# Patient Record
Sex: Female | Born: 1990 | Race: Black or African American | Hispanic: No | Marital: Single | Smoking: Never smoker
Health system: Southern US, Community
[De-identification: ages and names within clinical notes are randomized; demographics above are authoritative.]

## PROBLEM LIST (undated history)

## (undated) ENCOUNTER — Inpatient Hospital Stay (HOSPITAL_COMMUNITY): Payer: Self-pay

## (undated) DIAGNOSIS — Z30017 Encounter for initial prescription of implantable subdermal contraceptive: Secondary | ICD-10-CM

## (undated) DIAGNOSIS — F32A Depression, unspecified: Secondary | ICD-10-CM

## (undated) DIAGNOSIS — F419 Anxiety disorder, unspecified: Secondary | ICD-10-CM

## (undated) DIAGNOSIS — R109 Unspecified abdominal pain: Secondary | ICD-10-CM

## (undated) DIAGNOSIS — Z3490 Encounter for supervision of normal pregnancy, unspecified, unspecified trimester: Secondary | ICD-10-CM

## (undated) DIAGNOSIS — N760 Acute vaginitis: Secondary | ICD-10-CM

## (undated) DIAGNOSIS — B379 Candidiasis, unspecified: Secondary | ICD-10-CM

## (undated) DIAGNOSIS — A749 Chlamydial infection, unspecified: Secondary | ICD-10-CM

## (undated) DIAGNOSIS — O200 Threatened abortion: Secondary | ICD-10-CM

## (undated) DIAGNOSIS — J302 Other seasonal allergic rhinitis: Secondary | ICD-10-CM

## (undated) DIAGNOSIS — Z309 Encounter for contraceptive management, unspecified: Secondary | ICD-10-CM

## (undated) DIAGNOSIS — N898 Other specified noninflammatory disorders of vagina: Secondary | ICD-10-CM

## (undated) DIAGNOSIS — F329 Major depressive disorder, single episode, unspecified: Secondary | ICD-10-CM

## (undated) DIAGNOSIS — J329 Chronic sinusitis, unspecified: Secondary | ICD-10-CM

## (undated) DIAGNOSIS — R519 Headache, unspecified: Secondary | ICD-10-CM

## (undated) DIAGNOSIS — N926 Irregular menstruation, unspecified: Secondary | ICD-10-CM

## (undated) DIAGNOSIS — O039 Complete or unspecified spontaneous abortion without complication: Secondary | ICD-10-CM

## (undated) DIAGNOSIS — Z3046 Encounter for surveillance of implantable subdermal contraceptive: Secondary | ICD-10-CM

## (undated) DIAGNOSIS — R51 Headache: Secondary | ICD-10-CM

## (undated) DIAGNOSIS — M549 Dorsalgia, unspecified: Secondary | ICD-10-CM

## (undated) DIAGNOSIS — G8929 Other chronic pain: Secondary | ICD-10-CM

## (undated) DIAGNOSIS — M419 Scoliosis, unspecified: Secondary | ICD-10-CM

## (undated) DIAGNOSIS — B9689 Other specified bacterial agents as the cause of diseases classified elsewhere: Secondary | ICD-10-CM

## (undated) HISTORY — DX: Other seasonal allergic rhinitis: J30.2

## (undated) HISTORY — DX: Unspecified abdominal pain: R10.9

## (undated) HISTORY — DX: Encounter for surveillance of implantable subdermal contraceptive: Z30.46

## (undated) HISTORY — DX: Complete or unspecified spontaneous abortion without complication: O03.9

## (undated) HISTORY — DX: Other specified bacterial agents as the cause of diseases classified elsewhere: B96.89

## (undated) HISTORY — DX: Acute vaginitis: N76.0

## (undated) HISTORY — DX: Chlamydial infection, unspecified: A74.9

## (undated) HISTORY — DX: Encounter for initial prescription of implantable subdermal contraceptive: Z30.017

## (undated) HISTORY — DX: Irregular menstruation, unspecified: N92.6

## (undated) HISTORY — DX: Other specified noninflammatory disorders of vagina: N89.8

## (undated) HISTORY — DX: Encounter for supervision of normal pregnancy, unspecified, unspecified trimester: Z34.90

## (undated) HISTORY — PX: WISDOM TOOTH EXTRACTION: SHX21

## (undated) HISTORY — DX: Threatened abortion: O20.0

## (undated) HISTORY — DX: Encounter for contraceptive management, unspecified: Z30.9

## (undated) HISTORY — DX: Chronic sinusitis, unspecified: J32.9

## (undated) HISTORY — PX: HERNIA REPAIR: SHX51

## (undated) HISTORY — DX: Candidiasis, unspecified: B37.9

---
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
## 2012-01-03 ENCOUNTER — Emergency Department (HOSPITAL_COMMUNITY): Payer: Medicaid Other

## 2012-01-03 ENCOUNTER — Encounter (HOSPITAL_COMMUNITY): Payer: Self-pay

## 2012-01-03 ENCOUNTER — Emergency Department (HOSPITAL_COMMUNITY)
Admission: EM | Admit: 2012-01-03 | Discharge: 2012-01-03 | Disposition: A | Discharge status: Home / Self Care | Payer: Medicaid Other | Attending: Emergency Medicine | Admitting: Emergency Medicine

## 2012-01-03 DIAGNOSIS — S7000XA Contusion of unspecified hip, initial encounter: Secondary | ICD-10-CM | POA: Insufficient documentation

## 2012-01-03 DIAGNOSIS — S139XXA Sprain of joints and ligaments of unspecified parts of neck, initial encounter: Secondary | ICD-10-CM | POA: Insufficient documentation

## 2012-01-03 DIAGNOSIS — Z8739 Personal history of other diseases of the musculoskeletal system and connective tissue: Secondary | ICD-10-CM | POA: Insufficient documentation

## 2012-01-03 DIAGNOSIS — S161XXA Strain of muscle, fascia and tendon at neck level, initial encounter: Secondary | ICD-10-CM

## 2012-01-03 DIAGNOSIS — Z8619 Personal history of other infectious and parasitic diseases: Secondary | ICD-10-CM | POA: Insufficient documentation

## 2012-01-03 DIAGNOSIS — Z8659 Personal history of other mental and behavioral disorders: Secondary | ICD-10-CM | POA: Insufficient documentation

## 2012-01-03 DIAGNOSIS — S7002XA Contusion of left hip, initial encounter: Secondary | ICD-10-CM

## 2012-01-03 MED ORDER — IBUPROFEN 800 MG PO TABS
ORAL_TABLET | ORAL | Status: AC
  Filled 2012-01-03: qty 1

## 2012-01-03 MED ORDER — IBUPROFEN 600 MG PO TABS: 600.0000 mg | ORAL_TABLET | Freq: Four times a day (QID) | ORAL | Status: DC | PRN

## 2012-01-03 MED ORDER — CYCLOBENZAPRINE HCL 10 MG PO TABS
10.0000 mg | ORAL_TABLET | Freq: Once | ORAL | Status: AC
  Administered 2012-01-03: 10 mg via ORAL

## 2012-01-03 MED ORDER — CYCLOBENZAPRINE HCL 10 MG PO TABS: 10.0000 mg | ORAL_TABLET | Freq: Three times a day (TID) | ORAL | Status: DC | PRN

## 2012-01-03 MED ORDER — IBUPROFEN 800 MG PO TABS
800.0000 mg | ORAL_TABLET | Freq: Once | ORAL | Status: AC
  Administered 2012-01-03: 800 mg via ORAL

## 2012-01-03 MED ORDER — CYCLOBENZAPRINE HCL 10 MG PO TABS
ORAL_TABLET | ORAL | Status: AC
  Filled 2012-01-03: qty 1
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
## 2012-01-03 NOTE — ED Notes (Signed)
Patient with no complaints at this time. Respirations even and unlabored. Skin warm/dry. Discharge instructions reviewed with patient at this time. Patient given opportunity to voice concerns/ask questions. Patient discharged at this time and left Emergency Department with steady gait.   

## 2012-01-06 NOTE — ED Provider Notes (Signed)
Medical screening examination/treatment/procedure(s) were performed by non-physician practitioner and as supervising physician I was immediately available for consultation/collaboration.  Donnetta Hutching, MD 01/06/12 [phone]

## 2012-02-07 ENCOUNTER — Emergency Department (HOSPITAL_COMMUNITY)
Admission: EM | Admit: 2012-02-07 | Discharge: 2012-02-07 | Disposition: A | Discharge status: Home / Self Care | Payer: Medicaid Other | Attending: Emergency Medicine | Admitting: Emergency Medicine

## 2012-02-07 ENCOUNTER — Encounter (HOSPITAL_COMMUNITY): Payer: Self-pay | Admitting: *Deleted

## 2012-02-07 DIAGNOSIS — Y9389 Activity, other specified: Secondary | ICD-10-CM | POA: Insufficient documentation

## 2012-02-07 DIAGNOSIS — Z8659 Personal history of other mental and behavioral disorders: Secondary | ICD-10-CM | POA: Insufficient documentation

## 2012-02-07 DIAGNOSIS — S335XXA Sprain of ligaments of lumbar spine, initial encounter: Secondary | ICD-10-CM | POA: Insufficient documentation

## 2012-02-07 DIAGNOSIS — Y929 Unspecified place or not applicable: Secondary | ICD-10-CM | POA: Insufficient documentation

## 2012-02-07 DIAGNOSIS — S39012A Strain of muscle, fascia and tendon of lower back, initial encounter: Secondary | ICD-10-CM

## 2012-02-07 DIAGNOSIS — Y999 Unspecified external cause status: Secondary | ICD-10-CM | POA: Insufficient documentation

## 2012-02-07 DIAGNOSIS — Z79899 Other long term (current) drug therapy: Secondary | ICD-10-CM | POA: Insufficient documentation

## 2012-02-07 DIAGNOSIS — S46819A Strain of other muscles, fascia and tendons at shoulder and upper arm level, unspecified arm, initial encounter: Secondary | ICD-10-CM

## 2012-02-07 DIAGNOSIS — S43499A Other sprain of unspecified shoulder joint, initial encounter: Secondary | ICD-10-CM | POA: Insufficient documentation

## 2012-02-07 DIAGNOSIS — Z8619 Personal history of other infectious and parasitic diseases: Secondary | ICD-10-CM | POA: Insufficient documentation

## 2012-02-07 DIAGNOSIS — Z8719 Personal history of other diseases of the digestive system: Secondary | ICD-10-CM | POA: Insufficient documentation

## 2012-02-07 DIAGNOSIS — X500XXA Overexertion from strenuous movement or load, initial encounter: Secondary | ICD-10-CM | POA: Insufficient documentation

## 2012-02-07 DIAGNOSIS — Z8739 Personal history of other diseases of the musculoskeletal system and connective tissue: Secondary | ICD-10-CM | POA: Insufficient documentation

## 2012-02-07 DIAGNOSIS — R300 Dysuria: Secondary | ICD-10-CM | POA: Insufficient documentation

## 2012-02-07 DIAGNOSIS — R350 Frequency of micturition: Secondary | ICD-10-CM | POA: Insufficient documentation

## 2012-02-07 DIAGNOSIS — Z3202 Encounter for pregnancy test, result negative: Secondary | ICD-10-CM | POA: Insufficient documentation

## 2012-02-07 LAB — URINE MICROSCOPIC-ADD ON

## 2012-02-07 LAB — URINALYSIS, ROUTINE W REFLEX MICROSCOPIC
Ketones, ur: NEGATIVE mg/dL
Specific Gravity, Urine: >1.03 — ABNORMAL HIGH (ref 1.005–1.030)
pH: 6 (ref 5.0–8.0)

## 2012-02-07 LAB — PREGNANCY, URINE: Preg Test, Ur: NEGATIVE

## 2012-02-07 MED ORDER — HYDROCODONE-ACETAMINOPHEN 5-325 MG PO TABS: ORAL_TABLET | ORAL | Status: DC

## 2012-02-07 MED ORDER — CYCLOBENZAPRINE HCL 10 MG PO TABS: 10.0000 mg | ORAL_TABLET | Freq: Three times a day (TID) | ORAL | Status: DC | PRN

## 2012-02-07 NOTE — ED Notes (Signed)
Dysuria, frequency of urination , backpain. No fever.

## 2012-02-07 NOTE — ED Provider Notes (Signed)
History     CSN: [id number]  Arrival date & time 02/07/12  1222   First MD Initiated Contact with Patient 02/07/12 1450      Chief Complaint  Patient presents with  . Back Pain    (Consider location/radiation/quality/duration/timing/severity/associated sxs/prior treatment) HPI Comments: Patient c/o pain to her right lower back and right shoulder for several days.  Onset of pain after lifting.  She also reports having urinary frequency.  She states the pain is worse with palpation and movement.  She denies numbness or weakness or the extremities, abd pain, fever, neck pain, vaginal bleeding or discharge.  Patient is a 22 y.o. female presenting with back pain. The history is provided by the patient.  Back Pain  This is a new problem. The current episode started 2 days ago. The problem occurs constantly. The problem has not changed since onset.The pain is associated with lifting heavy objects. The pain is present in the lumbar spine (right trapiezus muscle). The quality of the pain is described as aching. The pain does not radiate. The pain is mild. The symptoms are aggravated by bending, twisting and certain positions. Associated symptoms include dysuria. Pertinent negatives include no chest pain, no fever, no numbness, no abdominal pain, no abdominal swelling, no bowel incontinence, no perianal numbness, no bladder incontinence, no pelvic pain, no paresthesias, no paresis, no tingling and no weakness. She has tried nothing for the symptoms. The treatment provided no relief.    Past Medical History  Diagnosis Date  . Right sided abdominal pain   . Scoliosis   . No pertinent past medical history   . Chlamydia     treated 6/6  . Depression     History reviewed. No pertinent past surgical history.  Family History  Problem Relation Age of Onset  . Asthma Mother   . Asthma Sister   . Asthma Brother   . Asthma Daughter   . Diabetes Maternal Grandmother   . Hypertension Maternal  Grandmother   . Asthma Maternal Grandmother   . Heart disease Maternal Grandmother   . Stroke Maternal Grandmother   . Hypertension Maternal Grandfather   . Asthma Maternal Grandfather   . Heart disease Maternal Grandfather     History  Substance Use Topics  . Smoking status: Never Smoker   . Smokeless tobacco: Never Used  . Alcohol Use: No    OB History    Grav Para Term Preterm Abortions TAB SAB Ect Mult Living   3 2 2  0 0 0 0 0 0 2      Review of Systems  Constitutional: Negative for fever, activity change and appetite change.  Respiratory: Negative for shortness of breath.   Cardiovascular: Negative for chest pain.  Gastrointestinal: Negative for vomiting, abdominal pain, constipation and bowel incontinence.  Genitourinary: Positive for dysuria and frequency. Negative for bladder incontinence, hematuria, flank pain, decreased urine volume, vaginal bleeding, vaginal discharge, difficulty urinating, menstrual problem and pelvic pain.       No perineal numbness or incontinence of urine or feces  Musculoskeletal: Positive for back pain. Negative for joint swelling and arthralgias.  Skin: Negative for rash.  Neurological: Negative for tingling, weakness, numbness and paresthesias.  All other systems reviewed and are negative.    Allergies  Review of patient's allergies indicates no known allergies.  Home Medications   Current Outpatient Rx  Name  Route  Sig  Dispense  Refill  . NORETHIN-ETH ESTRAD-FE BIPHAS 1 MG-10 MCG / 10 MCG PO TABS  Oral   Take 1 tablet by mouth daily.           BP 111/63  Pulse 87  Temp 97.8 F (36.6 C) (Oral)  Resp 18  Ht 5\' 2"  (1.575 m)  Wt 120 lb (54.432 kg)  BMI 21.95 kg/m2  SpO2 100%  LMP 02/07/2012  Breastfeeding? No  Physical Exam  Nursing note and vitals reviewed. Constitutional: She is oriented to person, place, and time. She appears well-developed and well-nourished. No distress.  HENT:  Head: Normocephalic and  atraumatic.  Neck: Normal range of motion. Neck supple.  Cardiovascular: Normal rate, regular rhythm and intact distal pulses.   No murmur heard. Pulmonary/Chest: Effort normal and breath sounds normal.  Musculoskeletal: She exhibits tenderness. She exhibits no edema.       Lumbar back: She exhibits tenderness and pain. She exhibits normal range of motion, no bony tenderness, no swelling, no deformity, no laceration and normal pulse.       Back:       ttp of right trapezius and right lumbar paraspinal muscles.  rom of the extremities intact, grip strength nml, dp pulses brisk, sensation intact.  No edema.  Neurological: She is alert and oriented to person, place, and time. No cranial nerve deficit or sensory deficit. She exhibits normal muscle tone. Coordination and gait normal.  Reflex Scores:      Tricep reflexes are 2+ on the right side and 2+ on the left side.      Bicep reflexes are 2+ on the right side and 2+ on the left side.      Patellar reflexes are 2+ on the right side and 2+ on the left side.      Achilles reflexes are 2+ on the right side and 2+ on the left side. Skin: Skin is warm and dry.    ED Course  Procedures (including critical care time)  Labs Reviewed  URINALYSIS, ROUTINE W REFLEX MICROSCOPIC - Abnormal; Notable for the following:    Specific Gravity, Urine >1.030 (*)     Glucose, UA 100 (*)     Hgb urine dipstick SMALL (*)     Bilirubin Urine SMALL (*)     Protein, ur TRACE (*)     All other components within normal limits  URINE MICROSCOPIC-ADD ON - Abnormal; Notable for the following:    Squamous Epithelial / LPF FEW (*)     All other components within normal limits  PREGNANCY, URINE  URINE CULTURE    Results for orders placed during the hospital encounter of 02/07/12  URINALYSIS, ROUTINE W REFLEX MICROSCOPIC      Component Value Range   Color, Urine YELLOW  YELLOW   APPearance CLEAR  CLEAR   Specific Gravity, Urine >1.030 (*) 1.005 - 1.030   pH 6.0   5.0 - 8.0   Glucose, UA 100 (*) NEGATIVE mg/dL   Hgb urine dipstick SMALL (*) NEGATIVE   Bilirubin Urine SMALL (*) NEGATIVE   Ketones, ur NEGATIVE  NEGATIVE mg/dL   Protein, ur TRACE (*) NEGATIVE mg/dL   Urobilinogen, UA 1.0  0.0 - 1.0 mg/dL   Nitrite NEGATIVE  NEGATIVE   Leukocytes, UA NEGATIVE  NEGATIVE  URINE MICROSCOPIC-ADD ON      Component Value Range   Squamous Epithelial / LPF FEW (*) RARE   WBC, UA 0-2  <3 WBC/hpf   RBC / HPF 0-2  <3 RBC/hpf   Urine-Other MUCOUS PRESENT    PREGNANCY, URINE  Component Value Range   Preg Test, Ur NEGATIVE  NEGATIVE  URINE CULTURE      Component Value Range   Specimen Description URINE, CLEAN CATCH     Special Requests NONE     Culture  Setup Time 02/08/2012 02:51     Colony Count NO GROWTH     Culture NO GROWTH     Report Status 02/08/2012 FINAL       Urine culture pending  MDM    ttp of the right trapezius and lumbar paraspinal muscles.  Patient has ttp of the lumbar paraspinal muscles.  No focal neuro deficits on exam.  Ambulates with a steady gait.   Doubt emergent neurological or infectious process  Pain likely related to musculoskeletal pain .  Pt agrees to f/u with pmd if needed.     Pegi Milazzo L. [place], [place] 02/09/12 2101

## 2012-02-08 LAB — URINE CULTURE

## 2012-02-10 NOTE — ED Provider Notes (Signed)
Medical screening examination/treatment/procedure(s) were performed by non-physician practitioner and as supervising physician I was immediately available for consultation/collaboration.   Glynn Octave, MD 02/10/12 [phone]

## 2012-02-20 ENCOUNTER — Emergency Department (HOSPITAL_COMMUNITY)
Admission: EM | Admit: 2012-02-20 | Discharge: 2012-02-20 | Disposition: A | Discharge status: Home / Self Care | Payer: Medicaid Other | Attending: Emergency Medicine | Admitting: Emergency Medicine

## 2012-02-20 ENCOUNTER — Encounter (HOSPITAL_COMMUNITY): Payer: Self-pay | Admitting: *Deleted

## 2012-02-20 DIAGNOSIS — Z8739 Personal history of other diseases of the musculoskeletal system and connective tissue: Secondary | ICD-10-CM | POA: Insufficient documentation

## 2012-02-20 DIAGNOSIS — R509 Fever, unspecified: Secondary | ICD-10-CM | POA: Insufficient documentation

## 2012-02-20 DIAGNOSIS — Z8619 Personal history of other infectious and parasitic diseases: Secondary | ICD-10-CM | POA: Insufficient documentation

## 2012-02-20 DIAGNOSIS — Z8659 Personal history of other mental and behavioral disorders: Secondary | ICD-10-CM | POA: Insufficient documentation

## 2012-02-20 DIAGNOSIS — IMO0001 Reserved for inherently not codable concepts without codable children: Secondary | ICD-10-CM | POA: Insufficient documentation

## 2012-02-20 DIAGNOSIS — J3489 Other specified disorders of nose and nasal sinuses: Secondary | ICD-10-CM | POA: Insufficient documentation

## 2012-02-20 DIAGNOSIS — R059 Cough, unspecified: Secondary | ICD-10-CM | POA: Insufficient documentation

## 2012-02-20 DIAGNOSIS — R6889 Other general symptoms and signs: Secondary | ICD-10-CM | POA: Insufficient documentation

## 2012-02-20 DIAGNOSIS — Z79899 Other long term (current) drug therapy: Secondary | ICD-10-CM | POA: Insufficient documentation

## 2012-02-20 DIAGNOSIS — R05 Cough: Secondary | ICD-10-CM | POA: Insufficient documentation

## 2012-02-20 DIAGNOSIS — R51 Headache: Secondary | ICD-10-CM | POA: Insufficient documentation

## 2012-02-20 MED ORDER — IBUPROFEN 400 MG PO TABS
600.0000 mg | ORAL_TABLET | Freq: Once | ORAL | Status: AC
  Administered 2012-02-20: 600 mg via ORAL
  Filled 2012-02-20: qty 2

## 2012-02-20 MED ORDER — OSELTAMIVIR PHOSPHATE 75 MG PO CAPS: 75.0000 mg | ORAL_CAPSULE | Freq: Two times a day (BID) | ORAL | Status: DC

## 2012-02-20 NOTE — ED Provider Notes (Signed)
Medical screening examination/treatment/procedure(s) were performed by non-physician practitioner and as supervising physician I was immediately available for consultation/collaboration. Kristoph Sattler, MD, FACEP   Reyne Falconi L Tanae Petrosky, MD 02/20/12 1948 

## 2012-02-20 NOTE — ED Provider Notes (Addendum)
History     CSN: [id number]  Arrival date & time 02/20/12  1512   First MD Initiated Contact with Patient 02/20/12 1612      Chief Complaint  Patient presents with  . Fever    (Consider location/radiation/quality/duration/timing/severity/associated sxs/prior treatment) HPI Comments: Deanna Hughes is a 22 y.o. Female who developed sudden onset of fever to 100.0,  Generalized headache,  Nonproductive cough with sneezing,  Nasal congestion with clear rhinorrhea.  She also described generalized myalgias.  She denies nausea,  Vomiting,  Abdominal pain, sob, chest pain and diarrhea.  She has not taken any medicines prior to arrival.  She has not had a flu shot this season.      The history is provided by the patient.    Past Medical History  Diagnosis Date  . Right sided abdominal pain   . Scoliosis   . No pertinent past medical history   . Chlamydia     treated 6/6  . Depression     History reviewed. No pertinent past surgical history.  Family History  Problem Relation Age of Onset  . Asthma Mother   . Asthma Sister   . Asthma Brother   . Asthma Daughter   . Diabetes Maternal Grandmother   . Hypertension Maternal Grandmother   . Asthma Maternal Grandmother   . Heart disease Maternal Grandmother   . Stroke Maternal Grandmother   . Hypertension Maternal Grandfather   . Asthma Maternal Grandfather   . Heart disease Maternal Grandfather     History  Substance Use Topics  . Smoking status: Never Smoker   . Smokeless tobacco: Never Used  . Alcohol Use: No    OB History    Grav Para Term Preterm Abortions TAB SAB Ect Mult Living   3 2 2  0 0 0 0 0 0 2      Review of Systems  Constitutional: Positive for fever and chills.  HENT: Positive for congestion and rhinorrhea. Negative for sore throat, neck pain and neck stiffness.   Eyes: Negative.   Respiratory: Positive for cough. Negative for chest tightness, shortness of breath and wheezing.   Cardiovascular:  Negative for chest pain.  Gastrointestinal: Negative for nausea and abdominal pain.  Genitourinary: Negative.   Musculoskeletal: Positive for myalgias. Negative for joint swelling and arthralgias.  Skin: Negative.  Negative for rash and wound.  Neurological: Positive for headaches. Negative for dizziness, weakness, light-headedness and numbness.  Hematological: Negative.   Psychiatric/Behavioral: Negative.     Allergies  Review of patient's allergies indicates no known allergies.  Home Medications   Current Outpatient Rx  Name  Route  Sig  Dispense  Refill  . CYCLOBENZAPRINE HCL 10 MG PO TABS   Oral   Take 1 tablet (10 mg total) by mouth 3 (three) times daily as needed for muscle spasms.   21 tablet   0   . HYDROCODONE-ACETAMINOPHEN 5-325 MG PO TABS      Take one-two tabs po q 4-6 hrs prn pain   15 tablet   0   . NORETHIN-ETH ESTRAD-FE BIPHAS 1 MG-10 MCG / 10 MCG PO TABS   Oral   Take 1 tablet by mouth daily.         . OSELTAMIVIR PHOSPHATE 75 MG PO CAPS   Oral   Take 1 capsule (75 mg total) by mouth 2 (two) times daily.   10 capsule   0     BP 108/60  Pulse 113  Temp 99.5 F (  37.5 C) (Oral)  Resp 20  Ht 5\' 2"  (1.575 m)  Wt 125 lb (56.7 kg)  BMI 22.86 kg/m2  SpO2 100%  LMP 02/07/2012  Breastfeeding? No  Physical Exam  Constitutional: She is oriented to person, place, and time. She appears well-developed and well-nourished.       Low grade fever at 99.5  HENT:  Head: Normocephalic and atraumatic.  Right Ear: Tympanic membrane and ear canal normal.  Left Ear: Tympanic membrane and ear canal normal.  Nose: Mucosal edema and rhinorrhea present.  Mouth/Throat: Uvula is midline, oropharynx is clear and moist and mucous membranes are normal. No oropharyngeal exudate, posterior oropharyngeal edema, posterior oropharyngeal erythema or tonsillar abscesses.  Eyes: Conjunctivae normal are normal.  Cardiovascular: Regular rhythm and normal heart sounds.   Tachycardia present.   Pulmonary/Chest: Effort normal. No respiratory distress. She has no wheezes. She has no rales.  Abdominal: Soft. There is no tenderness.  Musculoskeletal: Normal range of motion.  Lymphadenopathy:    She has no cervical adenopathy.  Neurological: She is alert and oriented to person, place, and time.  Skin: Skin is warm and dry. No rash noted.  Psychiatric: She has a normal mood and affect.    ED Course  Procedures (including critical care time)  Labs Reviewed - No data to display No results found.   1. Febrile illness       MDM  Febrile illness,  Sx suggestive of influenza.  Offered tamiflu which pt will take.  Encouraged rest,  Fluids,  Tylenol or motrin prn fever.          Burgess Amor, PA 02/20/12 1637  Burgess Amor, PA 02/20/12 [phone]

## 2012-02-20 NOTE — ED Provider Notes (Signed)
Medical screening examination/treatment/procedure(s) were performed by non-physician practitioner and as supervising physician I was immediately available for consultation/collaboration. Devoria Albe, MD, Armando Gang   Ward Givens, MD 02/20/12 [phone]

## 2012-02-20 NOTE — ED Notes (Signed)
Fever, body aches, cough, no sore throat, no vomiting,

## 2012-06-11 ENCOUNTER — Emergency Department (HOSPITAL_COMMUNITY)
Admission: EM | Admit: 2012-06-11 | Discharge: 2012-06-11 | Disposition: A | Discharge status: Home / Self Care | Payer: Medicaid Other | Attending: Emergency Medicine | Admitting: Emergency Medicine

## 2012-06-11 ENCOUNTER — Encounter (HOSPITAL_COMMUNITY): Payer: Self-pay | Admitting: *Deleted

## 2012-06-11 DIAGNOSIS — Z79899 Other long term (current) drug therapy: Secondary | ICD-10-CM | POA: Insufficient documentation

## 2012-06-11 DIAGNOSIS — F329 Major depressive disorder, single episode, unspecified: Secondary | ICD-10-CM | POA: Insufficient documentation

## 2012-06-11 DIAGNOSIS — K4090 Unilateral inguinal hernia, without obstruction or gangrene, not specified as recurrent: Secondary | ICD-10-CM | POA: Insufficient documentation

## 2012-06-11 DIAGNOSIS — F3289 Other specified depressive episodes: Secondary | ICD-10-CM | POA: Insufficient documentation

## 2012-06-11 DIAGNOSIS — Z8619 Personal history of other infectious and parasitic diseases: Secondary | ICD-10-CM | POA: Insufficient documentation

## 2012-06-11 DIAGNOSIS — Z8739 Personal history of other diseases of the musculoskeletal system and connective tissue: Secondary | ICD-10-CM | POA: Insufficient documentation

## 2012-06-11 MED ORDER — IBUPROFEN 600 MG PO TABS: 600.0000 mg | ORAL_TABLET | Freq: Four times a day (QID) | ORAL | Status: DC | PRN

## 2012-06-11 MED ORDER — IBUPROFEN 400 MG PO TABS
400.0000 mg | ORAL_TABLET | Freq: Once | ORAL | Status: AC
  Administered 2012-06-11: 400 mg via ORAL
  Filled 2012-06-11: qty 1

## 2012-06-11 MED ORDER — DOCUSATE SODIUM 100 MG PO CAPS: 100.0000 mg | ORAL_CAPSULE | Freq: Two times a day (BID) | ORAL | Status: DC

## 2012-06-11 MED ORDER — TRAMADOL HCL 50 MG PO TABS: 50.0000 mg | ORAL_TABLET | Freq: Four times a day (QID) | ORAL | Status: DC | PRN

## 2012-06-11 NOTE — ED Notes (Signed)
Patient w/R lower quadrant hernia x 2 years w/pain beginning last night.

## 2012-06-11 NOTE — ED Provider Notes (Signed)
History  This chart was scribed for [name], MD by Bennett Scrape, ED Scribe. This patient was seen in room [facility] and the patient's care was started at 3:10 PM.  CSN: [id number]  Arrival date & time 06/11/12  1229   First MD Initiated Contact with Patient 06/11/12 1510      Chief Complaint  Patient presents with  . Groin Pain    Patient is a 22 y.o. female presenting with groin pain. The history is provided by the patient. No language interpreter was used.  Groin Pain This is a new problem. The current episode started yesterday. The problem occurs constantly. The problem has been gradually worsening. The symptoms are aggravated by walking, coughing and exertion. Nothing relieves the symptoms. She has tried nothing for the symptoms.    HPI Comments: Deanna Hughes is a 22 y.o. female who presents to the Emergency Department complaining of a right inguinal hernia that has been present for the past year with the development of constant pain last night. The pain is worse with exertion, heavy lifting, ambulation and coughing. She also states that it has been non-reducible since last night when at baseline it is. She denies excessive straining and reports that she has been able to have normal BMs since the onset of the pain. She reports that she has been putting off following up with a surgeon due to fear of sugery.  She denies nausea, emesis and fever as associated symptoms. She has a h/o scoliosis and depression. Pt denies smoking and alcohol use.  Past Medical History  Diagnosis Date  . Right sided abdominal pain   . Scoliosis   . No pertinent past medical history   . Chlamydia     treated 6/6  . Depression     History reviewed. No pertinent past surgical history.  Family History  Problem Relation Age of Onset  . Asthma Mother   . Asthma Sister   . Asthma Brother   . Asthma Daughter   . Diabetes Maternal Grandmother   . Hypertension Maternal Grandmother   .  Asthma Maternal Grandmother   . Heart disease Maternal Grandmother   . Stroke Maternal Grandmother   . Hypertension Maternal Grandfather   . Asthma Maternal Grandfather   . Heart disease Maternal Grandfather     History  Substance Use Topics  . Smoking status: Never Smoker   . Smokeless tobacco: Never Used  . Alcohol Use: No    OB History   Grav Para Term Preterm Abortions TAB SAB Ect Mult Living   3 2 2  0 0 0 0 0 0 2      Review of Systems  Constitutional: Negative for fever.  Gastrointestinal: Negative for nausea and vomiting.  Genitourinary:       Positive for right groin pain  All other systems reviewed and are negative.    Allergies  Review of patient's allergies indicates no known allergies.  Home Medications   Current Outpatient Rx  Name  Route  Sig  Dispense  Refill  . loratadine (CLARITIN) 10 MG tablet   Oral   Take 10 mg by mouth daily.         . Norethindrone-Ethinyl Estradiol-Fe Biphas (LO LOESTRIN FE) 1 MG-10 MCG / 10 MCG tablet   Oral   Take 1 tablet by mouth daily.         Marland Kitchen docusate sodium (COLACE) 100 MG capsule   Oral   Take 1 capsule (100 mg total) by mouth  every 12 (twelve) hours.   60 capsule   0   . ibuprofen (ADVIL,MOTRIN) 600 MG tablet   Oral   Take 1 tablet (600 mg total) by mouth every 6 (six) hours as needed for pain.   30 tablet   0   . traMADol (ULTRAM) 50 MG tablet   Oral   Take 1 tablet (50 mg total) by mouth every 6 (six) hours as needed for pain.   15 tablet   0     Triage Vitals: BP 107/66  Pulse 84  Temp(Src) 98.2 F (36.8 C) (Oral)  Resp 18  Ht 5\' 2"  (1.575 m)  Wt 129 lb (58.514 kg)  BMI 23.59 kg/m2  SpO2 100%  LMP 05/22/2012  Physical Exam  Nursing note and vitals reviewed. Constitutional: She is oriented to person, place, and time. She appears well-developed and well-nourished. No distress.  HENT:  Head: Normocephalic and atraumatic.  Mouth/Throat: Oropharynx is clear and moist.  Eyes:  Conjunctivae and EOM are normal. Pupils are equal, round, and reactive to light.  Neck: Neck supple. No tracheal deviation present.  Cardiovascular: Normal rate and regular rhythm.   Pulmonary/Chest: Effort normal and breath sounds normal. No respiratory distress.  Abdominal: Soft. There is no tenderness.  Genitourinary:  Mild easily reduced right inguinal hernia with mild tenderness to palpation at the area, chaperone present  Musculoskeletal: Normal range of motion. She exhibits no edema.  Neurological: She is alert and oriented to person, place, and time.  Skin: Skin is warm and dry.  Psychiatric: She has a normal mood and affect. Her behavior is normal.    ED Course  Procedures (including critical care time)  Medications  ibuprofen (ADVIL,MOTRIN) tablet 400 mg (400 mg Oral Given 06/11/12 1555)    DIAGNOSTIC STUDIES: Oxygen Saturation is 100% on room air, normal by my interpretation.    COORDINATION OF CARE: 3:31 PM-Discussed treatment plan which includes ibuprofen with pt at bedside and pt agreed to plan. Advised pt to avoid exertion, to wear a supportive garment and to take stool softeners. Addressed symptoms to return for and strongly advised that she needs to f/u with a surgeon.  Labs Reviewed - No data to display No results found.   1. Right inguinal hernia       MDM  I personally performed the services described in this documentation, which was scribed in my presence. The recorded information has been reviewed and is accurate.      [name], MD 06/11/12 [phone]

## 2012-06-11 NOTE — ED Notes (Signed)
Pt alert & oriented x4, stable gait. Patient given discharge instructions, paperwork & prescription(s). Patient  instructed to stop at the registration desk to finish any additional paperwork. Patient verbalized understanding. Pt left department w/ no further questions. 

## 2012-06-11 NOTE — ED Notes (Signed)
Pt states she has a hernia to the upper right of her vagina, first started when she was pregnant with last child prior to childbirth. Hernia varies in degree of pain for pt but this time has been worse than others. When pt stands up hernia is pronounced.

## 2012-06-19 ENCOUNTER — Encounter: Payer: Self-pay | Admitting: *Deleted

## 2012-06-20 ENCOUNTER — Ambulatory Visit: Payer: Self-pay | Admitting: Adult Health

## 2012-07-03 ENCOUNTER — Encounter: Payer: Self-pay | Admitting: Adult Health

## 2012-07-03 ENCOUNTER — Ambulatory Visit (INDEPENDENT_AMBULATORY_CARE_PROVIDER_SITE_OTHER): Payer: Medicaid Other | Admitting: Adult Health

## 2012-07-03 VITALS — BP 98/70 | Ht 62.0 in | Wt 123.8 lb

## 2012-07-03 DIAGNOSIS — Z309 Encounter for contraceptive management, unspecified: Secondary | ICD-10-CM

## 2012-07-03 DIAGNOSIS — Z3202 Encounter for pregnancy test, result negative: Secondary | ICD-10-CM

## 2012-07-03 DIAGNOSIS — J309 Allergic rhinitis, unspecified: Secondary | ICD-10-CM

## 2012-07-03 DIAGNOSIS — B379 Candidiasis, unspecified: Secondary | ICD-10-CM

## 2012-07-03 DIAGNOSIS — Z3200 Encounter for pregnancy test, result unknown: Secondary | ICD-10-CM

## 2012-07-03 DIAGNOSIS — J302 Other seasonal allergic rhinitis: Secondary | ICD-10-CM

## 2012-07-03 DIAGNOSIS — N926 Irregular menstruation, unspecified: Secondary | ICD-10-CM

## 2012-07-03 HISTORY — DX: Other seasonal allergic rhinitis: J30.2

## 2012-07-03 HISTORY — DX: Candidiasis, unspecified: B37.9

## 2012-07-03 LAB — POCT WET PREP (WET MOUNT)

## 2012-07-03 MED ORDER — CETIRIZINE HCL 10 MG PO TABS: 10.0000 mg | ORAL_TABLET | Freq: Every day | ORAL | Status: DC

## 2012-07-03 MED ORDER — FLUCONAZOLE 150 MG PO TABS: 150.0000 mg | ORAL_TABLET | Freq: Once | ORAL | Status: DC

## 2012-07-03 MED ORDER — NORETHIN ACE-ETH ESTRAD-FE 1-20 MG-MCG(24) PO CHEW: 1.0000 | CHEWABLE_TABLET | Freq: Every day | ORAL | Status: DC

## 2012-07-03 NOTE — Progress Notes (Signed)
Subjective:     Patient ID: Deanna Hughes, female   DOB: 06/13/1990, 22 y.o.   MRN: [id number]  HPI Deanna Hughes is a 22 year old black female in for vaginal discharge and itching x 4 days, and eyes itching. She is having breakthrough bleeding with the pill on the second row. Review of Systems Patient denies any headaches, blurred vision, shortness of breath, chest pain, abdominal pain, problems with bowel movements, urination, or intercourse. No joint pain or mood swings. Positives as in HPI Reviewed past medical,surgical, social and family history. Reviewed medications and allergies.     Objective:   Physical Exam BP 98/70  Ht 5\' 2"  (1.575 m)  Wt 123 lb 12 oz (56.133 kg)  BMI 22.63 kg/m2  LMP 06/24/2012  Breastfeeding? NoUrine pregnancy test negative. Skin warm and dry.Pelvic: external genitalia is normal in appearance, vagina: white clumpy discharge without odor,I cleared the discharge out with a big Q-tip, cervix:smooth and bulbous, uterus: normal size, shape and contour, non tender, no masses felt, adnexa: no masses or tenderness noted. Wet prep: + for yeast GC/CHL obtained.      Assessment:      Yeast infection BTB Contraceptive management   Allergies Plan:      Rx Diflucan 150 mg 1 now and 1 in 3 days if needed with 1 refill Rx zyrtec 10 mg 1 daily prn Rx minastrin, finish current pack of pills then start Minastrin 1 daily, use condoms Return in 3 months in follow up

## 2012-07-03 NOTE — Patient Instructions (Addendum)
Follow up in 3 months Finish current pills the start minastrin and use condomsCandida Infection, Adult A candida infection (also called yeast, fungus and Monilia infection) is an overgrowth of yeast that can occur anywhere on the body. A yeast infection commonly occurs in warm, moist body areas. Usually, the infection remains localized but can spread to become a systemic infection. A yeast infection may be a sign of a more severe disease such as diabetes, leukemia, or AIDS. A yeast infection can occur in both men and women. In women, Candida vaginitis is a vaginal infection. It is one of the most common causes of vaginitis. Men usually do not have symptoms or know they have an infection until other problems develop. Men may find out they have a yeast infection because their sex partner has a yeast infection. Uncircumcised men are more likely to get a yeast infection than circumcised men. This is because the uncircumcised glans is not exposed to air and does not remain as dry as that of a circumcised glans. Older adults may develop yeast infections around dentures. CAUSES  Women  Antibiotics.  Steroid medication taken for a long time.  Being overweight (obese).  Diabetes.  Poor immune condition.  Certain serious medical conditions.  Immune suppressive medications for organ transplant patients.  Chemotherapy.  Pregnancy.  Menstration.  Stress and fatigue.  Intravenous drug use.  Oral contraceptives.  Wearing tight-fitting clothes in the crotch area.  Catching it from a sex partner who has a yeast infection.  Spermicide.  Intravenous, urinary, or other catheters. Men  Catching it from a sex partner who has a yeast infection.  Having oral or anal sex with a person who has the infection.  Spermicide.  Diabetes.  Antibiotics.  Poor immune system.  Medications that suppress the immune system.  Intravenous drug use.  Intravenous, urinary, or other  catheters. SYMPTOMS  Women  Thick, white vaginal discharge.  Vaginal itching.  Redness and swelling in and around the vagina.  Irritation of the lips of the vagina and perineum.  Blisters on the vaginal lips and perineum.  Painful sexual intercourse.  Low blood sugar (hypoglycemia).  Painful urination.  Bladder infections.  Intestinal problems such as constipation, indigestion, bad breath, bloating, increase in gas, diarrhea, or loose stools. Men  Men may develop intestinal problems such as constipation, indigestion, bad breath, bloating, increase in gas, diarrhea, or loose stools.  Dry, cracked skin on the penis with itching or discomfort.  Jock itch.  Dry, flaky skin.  Athlete's foot.  Hypoglycemia. DIAGNOSIS  Women  A history and an exam are performed.  The discharge may be examined under a microscope.  A culture may be taken of the discharge. Men  A history and an exam are performed.  Any discharge from the penis or areas of cracked skin will be looked at under the microscope and cultured.  Stool samples may be cultured. TREATMENT  Women  Vaginal antifungal suppositories and creams.  Medicated creams to decrease irritation and itching on the outside of the vagina.  Warm compresses to the perineal area to decrease swelling and discomfort.  Oral antifungal medications.  Medicated vaginal suppositories or cream for repeated or recurrent infections.  Wash and dry the irritation areas before applying the cream.  Eating yogurt with lactobacillus may help with prevention and treatment.  Sometimes painting the vagina with gentian violet solution may help if creams and suppositories do not work. Men  Antifungal creams and oral antifungal medications.  Sometimes treatment must continue  for 30 days after the symptoms go away to prevent recurrence. HOME CARE INSTRUCTIONS  Women  Use cotton underwear and avoid tight-fitting clothing.  Avoid  colored, scented toilet paper and deodorant tampons or pads.  Do not douche.  Keep your diabetes under control.  Finish all the prescribed medications.  Keep your skin clean and dry.  Consume milk or yogurt with lactobacillus active culture regularly. If you get frequent yeast infections and think that is what the infection is, there are over-the-counter medications that you can get. If the infection does not show healing in 3 days, talk to your caregiver.  Tell your sex partner you have a yeast infection. Your partner may need treatment also, especially if your infection does not clear up or recurs. Men  Keep your skin clean and dry.  Keep your diabetes under control.  Finish all prescribed medications.  Tell your sex partner that you have a yeast infection so they can be treated if necessary. SEEK MEDICAL CARE IF:   Your symptoms do not clear up or worsen in one week after treatment.  You have an oral temperature above 102 F (38.9 C).  You have trouble swallowing or eating for a prolonged time.  You develop blisters on and around your vagina.  You develop vaginal bleeding and it is not your menstrual period.  You develop abdominal pain.  You develop intestinal problems as mentioned above.  You get weak or lightheaded.  You have painful or increased urination.  You have pain during sexual intercourse. MAKE SURE YOU:   Understand these instructions.  Will watch your condition.  Will get help right away if you are not doing well or get worse. Document Released: 02/11/2004 Document Revised: 03/28/2011 Document Reviewed: 05/25/2009 [facility] Patient Information 2014 [place], [place].

## 2012-07-09 ENCOUNTER — Telehealth: Payer: Self-pay | Admitting: Adult Health

## 2012-07-09 ENCOUNTER — Emergency Department (HOSPITAL_COMMUNITY)
Admission: EM | Admit: 2012-07-09 | Discharge: 2012-07-09 | Disposition: A | Discharge status: Home / Self Care | Payer: Medicaid Other | Attending: Emergency Medicine | Admitting: Emergency Medicine

## 2012-07-09 ENCOUNTER — Encounter (HOSPITAL_COMMUNITY): Payer: Self-pay | Admitting: *Deleted

## 2012-07-09 DIAGNOSIS — K047 Periapical abscess without sinus: Secondary | ICD-10-CM | POA: Insufficient documentation

## 2012-07-09 DIAGNOSIS — K089 Disorder of teeth and supporting structures, unspecified: Secondary | ICD-10-CM | POA: Insufficient documentation

## 2012-07-09 DIAGNOSIS — Z8659 Personal history of other mental and behavioral disorders: Secondary | ICD-10-CM | POA: Insufficient documentation

## 2012-07-09 DIAGNOSIS — R000 Tachycardia, unspecified: Secondary | ICD-10-CM | POA: Insufficient documentation

## 2012-07-09 DIAGNOSIS — Z8619 Personal history of other infectious and parasitic diseases: Secondary | ICD-10-CM | POA: Insufficient documentation

## 2012-07-09 DIAGNOSIS — Z8739 Personal history of other diseases of the musculoskeletal system and connective tissue: Secondary | ICD-10-CM | POA: Insufficient documentation

## 2012-07-09 DIAGNOSIS — K0889 Other specified disorders of teeth and supporting structures: Secondary | ICD-10-CM

## 2012-07-09 MED ORDER — NAPROXEN 375 MG PO TABS: 375.0000 mg | ORAL_TABLET | Freq: Two times a day (BID) | ORAL | Status: DC

## 2012-07-09 MED ORDER — HYDROCODONE-ACETAMINOPHEN 5-325 MG PO TABS: 1.0000 | ORAL_TABLET | ORAL | Status: DC | PRN

## 2012-07-09 MED ORDER — AMOXICILLIN 250 MG PO CAPS: 250.0000 mg | ORAL_CAPSULE | Freq: Three times a day (TID) | ORAL | Status: DC

## 2012-07-09 NOTE — ED Notes (Signed)
D/c instructions given to pt, verbalized understanding of all. On cell calling for ride home.

## 2012-07-09 NOTE — ED Provider Notes (Signed)
History    CSN: [id number] Arrival date & time 07/09/12  4098  First MD Initiated Contact with Patient 07/09/12 1851     Chief Complaint  Patient presents with  . Dental Pain   (Consider location/radiation/quality/duration/timing/severity/associated sxs/prior Treatment) Patient is a 22 y.o. female presenting with tooth pain. The history is provided by the patient.  Dental Pain Location:  Upper Upper teeth location:  14/LU 1st molar Quality:  Throbbing and constant Severity:  Moderate Onset quality:  Gradual Duration:  3 days Timing:  Constant Chronicity:  New Context comment:  Tooth that has been filled Relieved by:  Nothing Worsened by:  Hot food/drink, cold food/drink and pressure Ineffective treatments:  None tried Associated symptoms: no congestion, no difficulty swallowing, no facial pain, no facial swelling, no fever, no headaches and no neck swelling    Past Medical History  Diagnosis Date  . Right sided abdominal pain   . Scoliosis   . No pertinent past medical history   . Chlamydia     treated 6/6  . Depression   . Yeast infection 07/03/2012  . Seasonal allergies 07/03/2012   History reviewed. No pertinent past surgical history. Family History  Problem Relation Age of Onset  . Asthma Mother   . Asthma Sister   . Asthma Brother   . Asthma Daughter   . Diabetes Maternal Grandmother   . Hypertension Maternal Grandmother   . Asthma Maternal Grandmother   . Heart disease Maternal Grandmother   . Stroke Maternal Grandmother   . Hypertension Maternal Grandfather   . Asthma Maternal Grandfather   . Heart disease Maternal Grandfather    History  Substance Use Topics  . Smoking status: Never Smoker   . Smokeless tobacco: Never Used  . Alcohol Use: No   OB History   Grav Para Term Preterm Abortions TAB SAB Ect Mult Living   3 2 2  0 1 0 1 0 0 2     Review of Systems  Constitutional: Negative for fever and chills.  HENT: Positive for dental problem.  Negative for congestion, facial swelling and sinus pressure.   Eyes: Negative for pain.  Respiratory: Negative for cough.   Gastrointestinal: Negative for nausea and vomiting.  Skin: Negative for rash.  Neurological: Negative for headaches.  Psychiatric/Behavioral: The patient is not nervous/anxious.    Deanna Hughes is a 22 y.o. female who presents to the ED with dental pain. The pain is located in the upper left. She called her dentist and the first appointment she could get was July 30th. Today the pain is worse with swelling above the upper left first molar.   Allergies  Review of patient's allergies indicates no known allergies.  Home Medications   Current Outpatient Rx  Name  Route  Sig  Dispense  Refill  . amoxicillin (AMOXIL) 250 MG capsule   Oral   Take 1 capsule (250 mg total) by mouth 3 (three) times daily.   28 capsule   0   . cetirizine (ZYRTEC) 10 MG tablet   Oral   Take 1 tablet (10 mg total) by mouth daily.   30 tablet   1   . HYDROcodone-acetaminophen (NORCO/VICODIN) 5-325 MG per tablet   Oral   Take 1 tablet by mouth every 4 (four) hours as needed.   15 tablet   0   . loratadine (CLARITIN) 10 MG tablet   Oral   Take 10 mg by mouth daily.         Marland Kitchen  naproxen (NAPROSYN) 375 MG tablet   Oral   Take 1 tablet (375 mg total) by mouth 2 (two) times daily.   20 tablet   0   . Norethin Ace-Eth Estrad-FE (MINASTRIN 24 FE) 1-20 MG-MCG(24) CHEW   Oral   Chew 1 tablet by mouth daily.   28 tablet   11   . Norethindrone-Ethinyl Estradiol-Fe Biphas (LO LOESTRIN FE) 1 MG-10 MCG / 10 MCG tablet   Oral   Take 1 tablet by mouth daily.          BP 141/95  Pulse 115  Temp(Src) 99 F (37.2 C) (Oral)  Resp 18  Ht 5\' 2"  (1.575 m)  Wt 104 lb (47.174 kg)  BMI 19.02 kg/m2  SpO2 96%  LMP 06/24/2012  Breastfeeding? No Physical Exam  Nursing note and vitals reviewed. Constitutional: She is oriented to person, place, and time. She appears well-developed  and well-nourished.  HENT:  Head: Normocephalic and atraumatic.  Right Ear: Tympanic membrane normal.  Left Ear: Tympanic membrane normal.  Nose: Nose normal.  Mouth/Throat: Uvula is midline, oropharynx is clear and moist and mucous membranes are normal. Dental caries: dental abscess.    Eyes: EOM are normal.  Neck: Neck supple.  Cardiovascular: Tachycardia present.   Pulmonary/Chest: Effort normal and breath sounds normal.  Abdominal: Soft. There is no tenderness.  Musculoskeletal: Normal range of motion.  Neurological: She is alert and oriented to person, place, and time. No cranial nerve deficit.  Skin: Skin is warm and dry.  Psychiatric: She has a normal mood and affect. Her behavior is normal. Judgment and thought content normal.    ED Course  Procedures   MDM  22 y.o. female with dental abscess. Will treat with antibiotics and pain management. Discussed with the patient clinical findings and plan of care. all questioned fully answered. She will return if any problems arise.    Medication List    STOP taking these medications       fluconazole 150 MG tablet  Commonly known as:  DIFLUCAN     ibuprofen 600 MG tablet  Commonly known as:  ADVIL,MOTRIN     traMADol 50 MG tablet  Commonly known as:  ULTRAM      TAKE these medications       amoxicillin 250 MG capsule  Commonly known as:  AMOXIL  Take 1 capsule (250 mg total) by mouth 3 (three) times daily.     HYDROcodone-acetaminophen 5-325 MG per tablet  Commonly known as:  NORCO/VICODIN  Take 1 tablet by mouth every 4 (four) hours as needed.     naproxen 375 MG tablet  Commonly known as:  NAPROSYN  Take 1 tablet (375 mg total) by mouth 2 (two) times daily.      ASK your doctor about these medications       cetirizine 10 MG tablet  Commonly known as:  ZYRTEC  Take 1 tablet (10 mg total) by mouth daily.     LO LOESTRIN FE 1 MG-10 MCG / 10 MCG tablet  Generic drug:  Norethindrone-Ethinyl Estradiol-Fe Biphas   Take 1 tablet by mouth daily.     loratadine 10 MG tablet  Commonly known as:  CLARITIN  Take 10 mg by mouth daily.     Norethin Ace-Eth Estrad-FE 1-20 MG-MCG(24) Chew  Commonly known as:  MINASTRIN 24 FE  Chew 1 tablet by mouth daily.         [facility] Orlene Och, [place] 07/09/12 1905

## 2012-07-09 NOTE — Telephone Encounter (Signed)
Still with some discharge, go ahead and take the other diflucan

## 2012-07-11 NOTE — ED Provider Notes (Signed)
Medical screening examination/treatment/procedure(s) were performed by non-physician practitioner and as supervising physician I was immediately available for consultation/collaboration.     Gilda Crease, MD 07/11/12 1505

## 2012-09-29 ENCOUNTER — Emergency Department (HOSPITAL_COMMUNITY)
Admission: EM | Admit: 2012-09-29 | Discharge: 2012-09-29 | Disposition: A | Discharge status: Home / Self Care | Payer: Medicaid Other | Attending: Emergency Medicine | Admitting: Emergency Medicine

## 2012-09-29 ENCOUNTER — Encounter (HOSPITAL_COMMUNITY): Payer: Self-pay | Admitting: *Deleted

## 2012-09-29 DIAGNOSIS — Z8709 Personal history of other diseases of the respiratory system: Secondary | ICD-10-CM | POA: Insufficient documentation

## 2012-09-29 DIAGNOSIS — Z79899 Other long term (current) drug therapy: Secondary | ICD-10-CM | POA: Insufficient documentation

## 2012-09-29 DIAGNOSIS — Z8739 Personal history of other diseases of the musculoskeletal system and connective tissue: Secondary | ICD-10-CM | POA: Insufficient documentation

## 2012-09-29 DIAGNOSIS — Z8659 Personal history of other mental and behavioral disorders: Secondary | ICD-10-CM | POA: Insufficient documentation

## 2012-09-29 DIAGNOSIS — Z8619 Personal history of other infectious and parasitic diseases: Secondary | ICD-10-CM | POA: Insufficient documentation

## 2012-09-29 DIAGNOSIS — K4090 Unilateral inguinal hernia, without obstruction or gangrene, not specified as recurrent: Secondary | ICD-10-CM | POA: Insufficient documentation

## 2012-09-29 MED ORDER — NAPROXEN 500 MG PO TABS: 500.0000 mg | ORAL_TABLET | Freq: Two times a day (BID) | ORAL | Status: DC

## 2012-09-29 NOTE — ED Provider Notes (Signed)
CSN: [id number]     Arrival date & time 09/29/12  4098 History  This chart was scribed for Celene Kras, MD by Quintella Reichert, ED scribe.  This patient was seen in room APA19[facility] and the patient's care was started at 8:32 AM.  Chief Complaint  Patient presents with  . Abdominal Pain    The history is provided by the patient. No language interpreter was used.    HPI Comments: Deanna Hughes is a 22 y.o. female who presents to the Emergency Department complaining of acute onset of her recurrent right lower abdominal pain related to a right inguinal hernia.  Pt states that her current episode began last night when the area became swollen and she developed constant, sharp, 8/10 pain that shoots up and down her right lower abdomen and pelvis.  She has not taken any pain medications today.  She denies fever, nausea or vomiting.  She states she was diagnosed with the hernia 1-2 years ago while pregnant with her son.  She hopes to have it surgically removed but has not yet been evaluated by a surgeon for this purpose.   Past Medical History  Diagnosis Date  . Right sided abdominal pain   . Scoliosis   . No pertinent past medical history   . Chlamydia     treated 6/6  . Depression   . Yeast infection 07/03/2012  . Seasonal allergies 07/03/2012    History reviewed. No pertinent past surgical history.   Family History  Problem Relation Age of Onset  . Asthma Mother   . Asthma Sister   . Asthma Brother   . Asthma Daughter   . Diabetes Maternal Grandmother   . Hypertension Maternal Grandmother   . Asthma Maternal Grandmother   . Heart disease Maternal Grandmother   . Stroke Maternal Grandmother   . Hypertension Maternal Grandfather   . Asthma Maternal Grandfather   . Heart disease Maternal Grandfather     History  Substance Use Topics  . Smoking status: Never Smoker   . Smokeless tobacco: Never Used  . Alcohol Use: No    OB History   Grav Para Term Preterm Abortions TAB  SAB Ect Mult Living   3 2 2  0 1 0 1 0 0 2      Review of Systems  Constitutional: Negative for fever.  Gastrointestinal: Negative for nausea, vomiting and diarrhea.   A complete 10 system review of systems was obtained and all systems are negative except as noted in the HPI and PMH.    Allergies  Review of patient's allergies indicates no known allergies.  Home Medications   Current Outpatient Rx  Name  Route  Sig  Dispense  Refill  . amoxicillin (AMOXIL) 250 MG capsule   Oral   Take 1 capsule (250 mg total) by mouth 3 (three) times daily.   28 capsule   0   . cetirizine (ZYRTEC) 10 MG tablet   Oral   Take 1 tablet (10 mg total) by mouth daily.   30 tablet   1   . HYDROcodone-acetaminophen (NORCO/VICODIN) 5-325 MG per tablet   Oral   Take 1 tablet by mouth every 4 (four) hours as needed.   15 tablet   0   . loratadine (CLARITIN) 10 MG tablet   Oral   Take 10 mg by mouth daily.         . naproxen (NAPROSYN) 500 MG tablet   Oral   Take 1 tablet (500  mg total) by mouth 2 (two) times daily.   30 tablet   0   . Norethin Ace-Eth Estrad-FE (MINASTRIN 24 FE) 1-20 MG-MCG(24) CHEW   Oral   Chew 1 tablet by mouth daily.   28 tablet   11   . Norethindrone-Ethinyl Estradiol-Fe Biphas (LO LOESTRIN FE) 1 MG-10 MCG / 10 MCG tablet   Oral   Take 1 tablet by mouth daily.          BP 111/67  Pulse 84  Temp(Src) 98.2 F (36.8 C) (Oral)  Resp 16  Ht 5\' 2"  (1.575 m)  Wt 125 lb (56.7 kg)  BMI 22.86 kg/m2  SpO2 99%  LMP 08/29/2012  Physical Exam  Nursing note and vitals reviewed. Constitutional: She appears well-developed and well-nourished. No distress.  HENT:  Head: Normocephalic and atraumatic.  Right Ear: External ear normal.  Left Ear: External ear normal.  Eyes: Conjunctivae are normal. Right eye exhibits no discharge. Left eye exhibits no discharge. No scleral icterus.  Neck: Neck supple. No tracheal deviation present.  Cardiovascular: Normal rate,  regular rhythm and intact distal pulses.   Pulmonary/Chest: Effort normal and breath sounds normal. No stridor. No respiratory distress. She has no wheezes. She has no rales.  Abdominal: Soft. Bowel sounds are normal. She exhibits no distension. There is no tenderness. There is no rebound and no guarding. A hernia is present. Hernia confirmed positive in the right inguinal area.  Easily reducible, more noticeable when pt is standing, completely resolves when pt is supine, no erythema, no signs of incarceration or strangulation  Musculoskeletal: She exhibits no edema and no tenderness.  Neurological: She is alert. She has normal strength. No sensory deficit. Cranial nerve deficit:  no gross defecits noted. She exhibits normal muscle tone. She displays no seizure activity. Coordination normal.  Skin: Skin is warm and dry. No rash noted.  Psychiatric: She has a normal mood and affect.    ED Course  Procedures (including critical care time)  DIAGNOSTIC STUDIES: Oxygen Saturation is 99% on room air, normal by my interpretation.    COORDINATION OF CARE: 8:38 AM-Discussed treatment plan which includes outpatient follow-up with pt at bedside and pt agreed to plan.    MDM   1. Inguinal hernia, right    Pt has reducible right inguinal hernia, no evidence of incarceration or strangulation.  Plan on outpatient follow-up.  Warning signs and precautions discussed. Work note given as requested    I personally performed the services described in this documentation, which was scribed in my presence.  The recorded information has been reviewed and is accurate.   Celene Kras, MD 09/29/12 [phone]

## 2012-09-29 NOTE — ED Notes (Signed)
Patient w/diagnosed R lower abdominal hernia, reports pain starting last night radiating across lower abdomen/pelvis. Sharp/shooting pain 8/10.  Has not taken anything for pain today.

## 2012-10-03 ENCOUNTER — Ambulatory Visit (INDEPENDENT_AMBULATORY_CARE_PROVIDER_SITE_OTHER): Payer: Medicaid Other | Admitting: Adult Health

## 2012-10-03 ENCOUNTER — Encounter: Payer: Self-pay | Admitting: Adult Health

## 2012-10-03 VITALS — BP 92/60 | Ht 62.0 in | Wt 128.0 lb

## 2012-10-03 DIAGNOSIS — N898 Other specified noninflammatory disorders of vagina: Secondary | ICD-10-CM | POA: Insufficient documentation

## 2012-10-03 DIAGNOSIS — Z309 Encounter for contraceptive management, unspecified: Secondary | ICD-10-CM

## 2012-10-03 HISTORY — DX: Other specified noninflammatory disorders of vagina: N89.8

## 2012-10-03 LAB — POCT WET PREP (WET MOUNT)

## 2012-10-03 MED ORDER — NORETHIN ACE-ETH ESTRAD-FE 1-20 MG-MCG(24) PO CHEW: 1.0000 | CHEWABLE_TABLET | Freq: Every day | ORAL | Status: DC

## 2012-10-03 NOTE — Patient Instructions (Addendum)
Take pills  Call in am for labs

## 2012-10-03 NOTE — Progress Notes (Signed)
Subjective:     Patient ID: Deanna Hughes, female   DOB: 1990-02-17, 22 y.o.   MRN: [id number]  HPI Deanna Hughes is in complaining of vaginal discharge, no odor ?itch.Not taking pills this month, but will restart.  Review of Systems See HPI Reviewed past medical,surgical, social and family history. Reviewed medications and allergies.     Objective:   Physical Exam BP 92/60  Ht 5\' 2"  (1.575 m)  Wt 128 lb (58.06 kg)  BMI 23.41 kg/m2  LMP 08/29/2012   Skin warm and dry.Pelvic: external genitalia is normal in appearance, vagina: white discharge without odor, cervix:smooth and bulbous, uterus: normal size, shape and contour, non tender, no masses felt, adnexa: no masses or tenderness noted. Wet prep: +WBCs. GC/CHL obtained.  Assessment:     Vaginal discharge Contraceptive management    Plan:     Check GC/CHL Refilled minastrin x 1 year,start back with next period Call for labs in am Follow up prn

## 2012-10-04 ENCOUNTER — Telehealth: Payer: Self-pay | Admitting: *Deleted

## 2012-10-04 LAB — GC/CHLAMYDIA PROBE AMP: CT Probe RNA: NEGATIVE

## 2012-10-09 NOTE — Telephone Encounter (Signed)
Pt aware of results and wants to come by and pick up a copy this afternoon.

## 2012-12-12 ENCOUNTER — Encounter (HOSPITAL_COMMUNITY): Payer: Self-pay | Admitting: Emergency Medicine

## 2012-12-12 ENCOUNTER — Emergency Department (HOSPITAL_COMMUNITY)
Admission: EM | Admit: 2012-12-12 | Discharge: 2012-12-12 | Disposition: A | Discharge status: Home / Self Care | Payer: Medicaid Other | Attending: Emergency Medicine | Admitting: Emergency Medicine

## 2012-12-12 DIAGNOSIS — K047 Periapical abscess without sinus: Secondary | ICD-10-CM

## 2012-12-12 DIAGNOSIS — Z8709 Personal history of other diseases of the respiratory system: Secondary | ICD-10-CM | POA: Insufficient documentation

## 2012-12-12 DIAGNOSIS — Z8619 Personal history of other infectious and parasitic diseases: Secondary | ICD-10-CM | POA: Insufficient documentation

## 2012-12-12 DIAGNOSIS — K044 Acute apical periodontitis of pulpal origin: Secondary | ICD-10-CM | POA: Insufficient documentation

## 2012-12-12 DIAGNOSIS — Z79899 Other long term (current) drug therapy: Secondary | ICD-10-CM | POA: Insufficient documentation

## 2012-12-12 DIAGNOSIS — Z8742 Personal history of other diseases of the female genital tract: Secondary | ICD-10-CM | POA: Insufficient documentation

## 2012-12-12 DIAGNOSIS — F3289 Other specified depressive episodes: Secondary | ICD-10-CM | POA: Insufficient documentation

## 2012-12-12 DIAGNOSIS — Z8739 Personal history of other diseases of the musculoskeletal system and connective tissue: Secondary | ICD-10-CM | POA: Insufficient documentation

## 2012-12-12 DIAGNOSIS — K029 Dental caries, unspecified: Secondary | ICD-10-CM

## 2012-12-12 DIAGNOSIS — F329 Major depressive disorder, single episode, unspecified: Secondary | ICD-10-CM | POA: Insufficient documentation

## 2012-12-12 MED ORDER — AMOXICILLIN 500 MG PO CAPS: 500.0000 mg | ORAL_CAPSULE | Freq: Three times a day (TID) | ORAL | Status: AC

## 2012-12-12 MED ORDER — TRAMADOL HCL 50 MG PO TABS: 50.0000 mg | ORAL_TABLET | Freq: Four times a day (QID) | ORAL | Status: DC | PRN

## 2012-12-12 MED ORDER — IBUPROFEN 600 MG PO TABS: 600.0000 mg | ORAL_TABLET | Freq: Four times a day (QID) | ORAL | Status: DC | PRN

## 2012-12-12 NOTE — ED Provider Notes (Signed)
CSN: [id number]     Arrival date & time 12/12/12  0910 History   First MD Initiated Contact with Patient 12/12/12 0911     Chief Complaint  Patient presents with  . Dental Pain   (Consider location/radiation/quality/duration/timing/severity/associated sxs/prior Treatment) HPI Comments: Deanna Hughes is a 22 y.o. Female presenting with a 1 day history of dental pain and gingival swelling, the swelling noticed when she woke this morning.   The patient has a history of a fracture to her left lower 2nd molar which has not caused problems until now.  There has been no fevers,  Chills, nausea or vomiting, also no complaint of difficulty swallowing,  Although chewing makes pain worse.  The patient has tried no medicines prior to arrival.    The history is provided by the patient.    Past Medical History  Diagnosis Date  . Right sided abdominal pain   . Scoliosis   . No pertinent past medical history   . Chlamydia     treated 6/6  . Depression   . Yeast infection 07/03/2012  . Seasonal allergies 07/03/2012  . Vaginal discharge 10/03/2012   History reviewed. No pertinent past surgical history. Family History  Problem Relation Age of Onset  . Asthma Mother   . Asthma Sister   . Asthma Brother   . Asthma Daughter   . Diabetes Maternal Grandmother   . Hypertension Maternal Grandmother   . Asthma Maternal Grandmother   . Heart disease Maternal Grandmother   . Stroke Maternal Grandmother   . Hypertension Maternal Grandfather   . Asthma Maternal Grandfather   . Heart disease Maternal Grandfather    History  Substance Use Topics  . Smoking status: Never Smoker   . Smokeless tobacco: Never Used  . Alcohol Use: No   OB History   Grav Para Term Preterm Abortions TAB SAB Ect Mult Living   3 2 2  0 1 0 1 0 0 2     Review of Systems  Constitutional: Negative for fever.  HENT: Positive for dental problem. Negative for facial swelling and sore throat.   Respiratory: Negative for  shortness of breath.   Musculoskeletal: Negative for neck pain and neck stiffness.    Allergies  Review of patient's allergies indicates no known allergies.  Home Medications   Current Outpatient Rx  Name  Route  Sig  Dispense  Refill  . amoxicillin (AMOXIL) 500 MG capsule   Oral   Take 1 capsule (500 mg total) by mouth 3 (three) times daily.   30 capsule   0   . cetirizine (ZYRTEC) 10 MG tablet   Oral   Take 1 tablet (10 mg total) by mouth daily.   30 tablet   1   . ibuprofen (ADVIL,MOTRIN) 600 MG tablet   Oral   Take 1 tablet (600 mg total) by mouth every 6 (six) hours as needed.   30 tablet   0   . Norethin Ace-Eth Estrad-FE (MINASTRIN 24 FE) 1-20 MG-MCG(24) CHEW   Oral   Chew 1 tablet by mouth daily.   28 tablet   11   . traMADol (ULTRAM) 50 MG tablet   Oral   Take 1 tablet (50 mg total) by mouth every 6 (six) hours as needed.   10 tablet   0    BP 103/57  Pulse 92  Temp(Src) 98.9 F (37.2 C) (Oral)  Resp 16  SpO2 100%  LMP 12/03/2012  Breastfeeding? No Physical Exam  Constitutional:  She is oriented to person, place, and time. She appears well-developed and well-nourished. No distress.  HENT:  Head: Normocephalic and atraumatic.  Right Ear: Tympanic membrane and external ear normal. Tympanic membrane is not erythematous.  Left Ear: Tympanic membrane and external ear normal. Tympanic membrane is not erythematous.  Nose: Nose normal.  Mouth/Throat: Oropharynx is clear and moist and mucous membranes are normal. No oral lesions. Dental caries present. No dental abscesses.    Eyes: Conjunctivae are normal.  Neck: Normal range of motion. Neck supple.  Cardiovascular: Normal rate and normal heart sounds.   Pulmonary/Chest: Effort normal.  Abdominal: She exhibits no distension.  Musculoskeletal: Normal range of motion.  Lymphadenopathy:    She has no cervical adenopathy.  Neurological: She is alert and oriented to person, place, and time.  Skin: Skin  is warm and dry. No erythema.  Psychiatric: She has a normal mood and affect.    ED Course  Procedures (including critical care time) Labs Review Labs Reviewed - No data to display Imaging Review No results found.  EKG Interpretation   None       MDM   1. Dental infection   2. Dental decay    No obvious abscess on exam,  Although I suspect early gingival/ dental infection.  She was prescribed amoxil,  Ibuprofen,  Few tramadol for use today and tomorrow,  Expect improved pain after this time.  Encouraged f/u with dentist,  Pt to call for appt.    Burgess Amor, PA-C 12/12/12 [phone]

## 2012-12-12 NOTE — ED Notes (Signed)
Pt states pain and swelling to upper left gum and bottom left as well. Pt states this was first noticed when she woke up this morning.

## 2012-12-13 NOTE — ED Provider Notes (Signed)
Medical screening examination/treatment/procedure(s) were performed by non-physician practitioner and as supervising physician I was immediately available for consultation/collaboration.  EKG Interpretation   None          Gilda Crease, MD 12/13/12 [phone]

## 2012-12-20 ENCOUNTER — Encounter (HOSPITAL_COMMUNITY): Payer: Self-pay | Admitting: Emergency Medicine

## 2012-12-20 ENCOUNTER — Emergency Department (HOSPITAL_COMMUNITY)
Admission: EM | Admit: 2012-12-20 | Discharge: 2012-12-20 | Disposition: A | Discharge status: Home / Self Care | Payer: Medicaid Other | Attending: Emergency Medicine | Admitting: Emergency Medicine

## 2012-12-20 DIAGNOSIS — Z8619 Personal history of other infectious and parasitic diseases: Secondary | ICD-10-CM | POA: Insufficient documentation

## 2012-12-20 DIAGNOSIS — K0889 Other specified disorders of teeth and supporting structures: Secondary | ICD-10-CM

## 2012-12-20 DIAGNOSIS — K089 Disorder of teeth and supporting structures, unspecified: Secondary | ICD-10-CM | POA: Insufficient documentation

## 2012-12-20 DIAGNOSIS — Z79899 Other long term (current) drug therapy: Secondary | ICD-10-CM | POA: Insufficient documentation

## 2012-12-20 DIAGNOSIS — Z8742 Personal history of other diseases of the female genital tract: Secondary | ICD-10-CM | POA: Insufficient documentation

## 2012-12-20 DIAGNOSIS — Z8659 Personal history of other mental and behavioral disorders: Secondary | ICD-10-CM | POA: Insufficient documentation

## 2012-12-20 DIAGNOSIS — K044 Acute apical periodontitis of pulpal origin: Secondary | ICD-10-CM | POA: Insufficient documentation

## 2012-12-20 DIAGNOSIS — K029 Dental caries, unspecified: Secondary | ICD-10-CM | POA: Insufficient documentation

## 2012-12-20 DIAGNOSIS — Z8739 Personal history of other diseases of the musculoskeletal system and connective tissue: Secondary | ICD-10-CM | POA: Insufficient documentation

## 2012-12-20 DIAGNOSIS — Z792 Long term (current) use of antibiotics: Secondary | ICD-10-CM | POA: Insufficient documentation

## 2012-12-20 MED ORDER — LIDOCAINE HCL (PF) 1 % IJ SOLN
INTRAMUSCULAR | Status: AC
  Administered 2012-12-20: 16:00:00
  Filled 2012-12-20: qty 5

## 2012-12-20 MED ORDER — CEFTRIAXONE SODIUM 1 G IJ SOLR
1.0000 g | Freq: Once | INTRAMUSCULAR | Status: AC
  Administered 2012-12-20: 1 g via INTRAMUSCULAR
  Filled 2012-12-20: qty 10

## 2012-12-20 MED ORDER — TRAMADOL HCL 50 MG PO TABS: 50.0000 mg | ORAL_TABLET | Freq: Four times a day (QID) | ORAL | Status: DC | PRN

## 2012-12-20 NOTE — ED Notes (Signed)
Swelling of gums , says she was sent here from dentist office. To get IV antibiotic.

## 2012-12-20 NOTE — ED Notes (Signed)
Patient given discharge instruction, verbalized understand. Patient ambulatory out of the department.  

## 2012-12-22 NOTE — ED Provider Notes (Signed)
CSN: [id number]     Arrival date & time 12/20/12  1506 History   First MD Initiated Contact with Patient 12/20/12 1514     Chief Complaint  Patient presents with  . Dental Pain   (Consider location/radiation/quality/duration/timing/severity/associated sxs/prior Treatment) Patient is a 22 y.o. female presenting with tooth pain. The history is provided by the patient.  Dental Pain Location:  Upper Upper teeth location:  15/LU 2nd molar, 14/LU 1st molar and 16/LU 3rd molar Quality:  Aching, throbbing and sharp Severity:  Moderate Onset quality:  Gradual Timing:  Constant Progression:  Unchanged Chronicity:  New Context: dental caries and poor dentition   Context: not trauma   Previous work-up:  Dental exam Relieved by:  Nothing Worsened by:  Nothing tried Ineffective treatments:  None tried Associated symptoms: facial pain and gum swelling   Associated symptoms: no congestion, no difficulty swallowing, no facial swelling, no fever, no headaches, no neck pain, no neck swelling, no oral bleeding, no oral lesions and no trismus   Risk factors: lack of dental care and periodontal disease   Risk factors: no diabetes and no smoking     Patient reports she was sent here from her dentists office to get antibiotics for a dental infection.  She reports pain and swelling of the gums surrounding her left upper molars.  She denies difficulty swallowing, breathing, fever or chills  Past Medical History  Diagnosis Date  . Right sided abdominal pain   . Scoliosis   . No pertinent past medical history   . Chlamydia     treated 6/6  . Depression   . Yeast infection 07/03/2012  . Seasonal allergies 07/03/2012  . Vaginal discharge 10/03/2012   History reviewed. No pertinent past surgical history. Family History  Problem Relation Age of Onset  . Asthma Mother   . Asthma Sister   . Asthma Brother   . Asthma Daughter   . Diabetes Maternal Grandmother   . Hypertension Maternal Grandmother   .  Asthma Maternal Grandmother   . Heart disease Maternal Grandmother   . Stroke Maternal Grandmother   . Hypertension Maternal Grandfather   . Asthma Maternal Grandfather   . Heart disease Maternal Grandfather    History  Substance Use Topics  . Smoking status: Never Smoker   . Smokeless tobacco: Never Used  . Alcohol Use: No   OB History   Grav Para Term Preterm Abortions TAB SAB Ect Mult Living   3 2 2  0 1 0 1 0 0 2     Review of Systems  Constitutional: Negative for fever and appetite change.  HENT: Positive for dental problem. Negative for congestion, facial swelling, mouth sores, sore throat and trouble swallowing.   Eyes: Negative for pain and visual disturbance.  Musculoskeletal: Negative for neck pain and neck stiffness.  Neurological: Negative for dizziness, facial asymmetry and headaches.  Hematological: Negative for adenopathy.  All other systems reviewed and are negative.    Allergies  Review of patient's allergies indicates no known allergies.  Home Medications   Current Outpatient Rx  Name  Route  Sig  Dispense  Refill  . Norethin Ace-Eth Estrad-FE (MINASTRIN 24 FE) 1-20 MG-MCG(24) CHEW   Oral   Chew 1 tablet by mouth daily.   28 tablet   11   . amoxicillin (AMOXIL) 500 MG capsule   Oral   Take 1 capsule (500 mg total) by mouth 3 (three) times daily.   30 capsule   0   .  ibuprofen (ADVIL,MOTRIN) 600 MG tablet   Oral   Take 1 tablet (600 mg total) by mouth every 6 (six) hours as needed.   30 tablet   0   . traMADol (ULTRAM) 50 MG tablet   Oral   Take 1 tablet (50 mg total) by mouth every 6 (six) hours as needed.   10 tablet   0   . traMADol (ULTRAM) 50 MG tablet   Oral   Take 1 tablet (50 mg total) by mouth every 6 (six) hours as needed.   15 tablet   0    BP 111/53  Pulse 80  Temp(Src) 99.5 F (37.5 C) (Oral)  Resp 16  Ht 5\' 2"  (1.575 m)  Wt 128 lb (58.06 kg)  BMI 23.41 kg/m2  SpO2 100%  LMP 12/03/2012  Breastfeeding?  No Physical Exam  Nursing note and vitals reviewed. Constitutional: She is oriented to person, place, and time. She appears well-developed and well-nourished. No distress.  HENT:  Head: Normocephalic and atraumatic.  Right Ear: Tympanic membrane and ear canal normal.  Left Ear: Tympanic membrane and ear canal normal.  Mouth/Throat: Uvula is midline, oropharynx is clear and moist and mucous membranes are normal. No trismus in the jaw. Dental caries present. No dental abscesses or uvula swelling.  Dental caries of the left upper molars. ttp of the left upper second molar.  No facial swelling, obvious dental abscess, trismus, or sublingual abnml.    Neck: Normal range of motion. Neck supple.  Cardiovascular: Normal rate, regular rhythm and normal heart sounds.   No murmur heard. Pulmonary/Chest: Effort normal and breath sounds normal.  Musculoskeletal: Normal range of motion.  Lymphadenopathy:    She has no cervical adenopathy.  Neurological: She is alert and oriented to person, place, and time. She exhibits normal muscle tone. Coordination normal.  Skin: Skin is warm and dry.    ED Course  Procedures (including critical care time) Labs Review Labs Reviewed - No data to display Imaging Review No results found.  EKG Interpretation   None       MDM   1. Pain, dental    IM rocephin given.  Pt has prescription for oral antibiotic from her dentist.   No concerning sx's for Ludwig angina.  Pt is well appearing, agrees to close f/u with her dentist.  Appears stable for d/c   Bitha Fauteux L. Iszabella Hebenstreit, PA-C 12/22/12 2030

## 2012-12-27 ENCOUNTER — Ambulatory Visit: Payer: Medicaid Other | Admitting: Adult Health

## 2012-12-30 NOTE — ED Provider Notes (Signed)
Medical screening examination/treatment/procedure(s) were performed by non-physician practitioner and as supervising physician I was immediately available for consultation/collaboration.  EKG Interpretation   None         Janesha Brissette L Naelani Lafrance, MD 12/30/12 1559 

## 2013-01-01 ENCOUNTER — Ambulatory Visit: Payer: Medicaid Other | Admitting: Adult Health

## 2013-01-04 ENCOUNTER — Ambulatory Visit: Payer: Medicaid Other | Admitting: Adult Health

## 2013-01-24 ENCOUNTER — Ambulatory Visit: Payer: Medicaid Other | Admitting: Adult Health

## 2013-01-25 ENCOUNTER — Encounter: Payer: Self-pay | Admitting: Adult Health

## 2013-01-25 ENCOUNTER — Ambulatory Visit (INDEPENDENT_AMBULATORY_CARE_PROVIDER_SITE_OTHER): Payer: Medicaid Other | Admitting: Adult Health

## 2013-01-25 VITALS — BP 98/60 | Ht 62.0 in | Wt 133.0 lb

## 2013-01-25 DIAGNOSIS — N898 Other specified noninflammatory disorders of vagina: Secondary | ICD-10-CM

## 2013-01-25 DIAGNOSIS — B379 Candidiasis, unspecified: Secondary | ICD-10-CM

## 2013-01-25 DIAGNOSIS — Z309 Encounter for contraceptive management, unspecified: Secondary | ICD-10-CM

## 2013-01-25 HISTORY — DX: Encounter for contraceptive management, unspecified: Z30.9

## 2013-01-25 LAB — POCT WET PREP (WET MOUNT): WBC WET PREP: POSITIVE

## 2013-01-25 MED ORDER — FLUCONAZOLE 150 MG PO TABS: ORAL_TABLET | ORAL | Status: DC

## 2013-01-25 MED ORDER — MEDROXYPROGESTERONE ACETATE 150 MG/ML IM SUSP: 150.0000 mg | INTRAMUSCULAR | Status: DC

## 2013-01-25 NOTE — Progress Notes (Signed)
Subjective:     Patient ID: Deanna Hughes, female   DOB: 01-05-1991, 23 y.o.   MRN: [id number]  HPI Deanna Hughes is a 23 year old black female in complaining of vaginal discharge and itch, has had antibiotic for bad tooth.And she wants to discuss birth control.  Review of Systems See HPI Reviewed past medical,surgical, social and family history. Reviewed medications and allergies.     Objective:   Physical Exam BP 98/60  Ht 5\' 2"  (1.575 m)  Wt 133 lb (60.328 kg)  BMI 24.32 kg/m2  LMP 12/30/2012   Skin warm and dry.Pelvic: external genitalia is normal in appearance, vagina: white discharge without odor, cervix:smooth and bulbous, uterus: normal size, shape and contour, non tender, no masses felt, adnexa: no masses or tenderness noted. Wet prep: + for yeast buds and +WBCs. Discussed depo vs nexplanon will try depo first.  Assessment:     Vaginal discharge Yeast infection Contraceptive management    Plan:     Rx diflucan 150 mg #2 1 now and 1 in 3 days with 1 refill Rx depo provera 150 mg disp 1 vial with 4 refills Call with [facility]enses for depo Review handout on depo

## 2013-01-25 NOTE — Patient Instructions (Signed)

## 2013-03-04 ENCOUNTER — Encounter (HOSPITAL_COMMUNITY): Payer: Self-pay | Admitting: Emergency Medicine

## 2013-03-04 ENCOUNTER — Emergency Department (HOSPITAL_COMMUNITY)
Admission: EM | Admit: 2013-03-04 | Discharge: 2013-03-04 | Disposition: A | Discharge status: Home / Self Care | Payer: Medicaid Other | Attending: Emergency Medicine | Admitting: Emergency Medicine

## 2013-03-04 DIAGNOSIS — Z8739 Personal history of other diseases of the musculoskeletal system and connective tissue: Secondary | ICD-10-CM | POA: Insufficient documentation

## 2013-03-04 DIAGNOSIS — J3489 Other specified disorders of nose and nasal sinuses: Secondary | ICD-10-CM | POA: Insufficient documentation

## 2013-03-04 DIAGNOSIS — Z8619 Personal history of other infectious and parasitic diseases: Secondary | ICD-10-CM | POA: Insufficient documentation

## 2013-03-04 DIAGNOSIS — Z8659 Personal history of other mental and behavioral disorders: Secondary | ICD-10-CM | POA: Insufficient documentation

## 2013-03-04 DIAGNOSIS — Z8742 Personal history of other diseases of the female genital tract: Secondary | ICD-10-CM | POA: Insufficient documentation

## 2013-03-04 DIAGNOSIS — H109 Unspecified conjunctivitis: Secondary | ICD-10-CM | POA: Insufficient documentation

## 2013-03-04 MED ORDER — TOBRAMYCIN 0.3 % OP SOLN
1.0000 [drp] | OPHTHALMIC | Status: DC
  Administered 2013-03-04: 1 [drp] via OPHTHALMIC
  Filled 2013-03-04: qty 5

## 2013-03-04 MED ORDER — KETOROLAC TROMETHAMINE 0.5 % OP SOLN
1.0000 [drp] | Freq: Four times a day (QID) | OPHTHALMIC | Status: DC
  Administered 2013-03-04: 1 [drp] via OPHTHALMIC

## 2013-03-04 NOTE — ED Provider Notes (Signed)
CSN: [id number]     Arrival date & time 03/04/13  0240 History   First MD Initiated Contact with Patient 03/04/13 [phone]     Chief Complaint  Patient presents with  . Eye Pain     (Consider location/radiation/quality/duration/timing/severity/associated sxs/prior Treatment) HPI Comments: Deanna Hughes is a 23 y.o. Female presenting with left eye irritation since last night.  She denies injury and does not wear corrective lenses or contacts, and denies any visual changes.  She reports irritation, itching and woke with mild redness of her conjunctiva along with crusted lid margins.  She has been exposed to pinkeye in a coworker.  Also describes a small "bump" on her upper lid margin which is new.  She's taken no medications for this condition.  Blinking makes her symptoms worse.     The history is provided by the patient.    Past Medical History  Diagnosis Date  . Right sided abdominal pain   . Scoliosis   . No pertinent past medical history   . Chlamydia     treated 6/6  . Depression   . Yeast infection 07/03/2012  . Seasonal allergies 07/03/2012  . Vaginal discharge 10/03/2012  . Contraceptive management 01/25/2013   History reviewed. No pertinent past surgical history. Family History  Problem Relation Age of Onset  . Asthma Mother   . Asthma Sister   . Asthma Brother   . Asthma Daughter   . Diabetes Maternal Grandmother   . Hypertension Maternal Grandmother   . Asthma Maternal Grandmother   . Heart disease Maternal Grandmother   . Stroke Maternal Grandmother   . Hypertension Maternal Grandfather   . Asthma Maternal Grandfather   . Heart disease Maternal Grandfather   . Asthma Son    History  Substance Use Topics  . Smoking status: Never Smoker   . Smokeless tobacco: Never Used  . Alcohol Use: No   OB History   Grav Para Term Preterm Abortions TAB SAB Ect Mult Living   3 2 2  0 1 0 1 0 0 2     Review of Systems  Constitutional: Negative for fever.  HENT:  Negative for congestion and facial swelling.   Eyes: Positive for discharge, redness and itching. Negative for visual disturbance.  Respiratory: Negative.   Cardiovascular: Negative.   Gastrointestinal: Negative for nausea.  Skin: Negative.  Negative for rash and wound.  Neurological: Negative for dizziness, light-headedness and headaches.  Psychiatric/Behavioral: Negative.       Allergies  Review of patient's allergies indicates no known allergies.  Home Medications   Current Outpatient Rx  Name  Route  Sig  Dispense  Refill  . fluconazole (DIFLUCAN) 150 MG tablet      Take 1 now and 1 in 3 days   2 tablet   1   . ibuprofen (ADVIL,MOTRIN) 600 MG tablet   Oral   Take 1 tablet (600 mg total) by mouth every 6 (six) hours as needed.   30 tablet   0   . medroxyPROGESTERone (DEPO-PROVERA) 150 MG/ML injection   Intramuscular   Inject 1 mL (150 mg total) into the muscle every 3 (three) months.   1 mL   4    BP 112/84  Pulse 91  Temp(Src) 97.9 F (36.6 C) (Oral)  Resp 18  SpO2 100%  LMP 01/30/2013 Physical Exam  Constitutional: She is oriented to person, place, and time. She appears well-developed and well-nourished.  HENT:  Head: Normocephalic and atraumatic.  Right Ear:  Tympanic membrane and ear canal normal.  Left Ear: Tympanic membrane and ear canal normal.  Nose: Mucosal edema and rhinorrhea present.  Mouth/Throat: Uvula is midline, oropharynx is clear and moist and mucous membranes are normal. No oropharyngeal exudate, posterior oropharyngeal edema, posterior oropharyngeal erythema or tonsillar abscesses.  Eyes: EOM are normal. Pupils are equal, round, and reactive to light. Left eye exhibits no chemosis and no exudate. No foreign body present in the left eye. Left conjunctiva is injected. Left eye exhibits normal extraocular motion.  Clear left eye drainage, mild injected conjunctiva.  There is a small approximate 1 mm raised skin-colored papule along the mid  upper lid margin at the lash line.  No surrounding erythema, no drainage.  Cardiovascular: Normal rate and normal heart sounds.   Pulmonary/Chest: Effort normal. No respiratory distress. She has no wheezes. She has no rales.  Abdominal: Soft. There is no tenderness.  Musculoskeletal: Normal range of motion.  Neurological: She is alert and oriented to person, place, and time.  Skin: Skin is warm and dry. No rash noted.  Psychiatric: She has a normal mood and affect.    ED Course  Procedures (including critical care time) Labs Review Labs Reviewed - No data to display Imaging Review No results found.  EKG Interpretation   None       MDM   Final diagnoses:  Conjunctivitis of left eye    Suspect early viral conjunctivitis given recent exposure.  She was placed on ketorolac drops for comfort, and given Tobrex drops to cover her for infection.  Advised frequent hand washing, recheck for worsen symptoms.    The patient appears reasonably screened and/or stabilized for discharge and I doubt any other medical condition or other [facility] requiring further screening, evaluation, or treatment in the ED at this time prior to discharge.   Evalee Jefferson, PA-C 03/04/13 1820

## 2013-03-04 NOTE — Discharge Instructions (Signed)
Conjunctivitis Conjunctivitis is commonly called "pink eye." Conjunctivitis can be caused by bacterial or viral infection, allergies, or injuries. There is usually redness of the lining of the eye, itching, discomfort, and sometimes discharge. There may be deposits of matter along the eyelids. A viral infection usually causes a watery discharge, while a bacterial infection causes a yellowish, thick discharge. Pink eye is very contagious and spreads by direct contact. You may be given antibiotic eyedrops as part of your treatment. Before using your eye medicine, remove all drainage from the eye by washing gently with warm water and cotton balls. Continue to use the medication until you have awakened 2 mornings in a row without discharge from the eye. Do not rub your eye. This increases the irritation and helps spread infection. Use separate towels from other household members. Wash your hands with soap and water before and after touching your eyes. Use cold compresses to reduce pain and sunglasses to relieve irritation from light. Do not wear contact lenses or wear eye makeup until the infection is gone. SEEK MEDICAL CARE IF:   Your symptoms are not better after 3 days of treatment.  You have increased pain or trouble seeing.  The outer eyelids become very red or swollen. Document Released: 02/11/2004 Document Revised: 03/28/2011 Document Reviewed: 01/03/2005 [facility] Patient Information 2014 [place].  You may apply 1 drop of the or lack drops 4 times daily for eye irritation.  Apply 1 drop of the Tobrex in each eye every 4 hours while awake for the next 7 days.  Apply warm compresses if your eye becomes crusted.  Wash your hands frequently as this is contagious.

## 2013-03-04 NOTE — ED Notes (Signed)
Pt c/o left eye pain and irritation since last night. Pt state symptoms became worse this morning. Pt also reports redness in eye earlier this morning.

## 2013-03-06 NOTE — ED Provider Notes (Signed)
Medical screening examination/treatment/procedure(s) were performed by non-physician practitioner and as supervising physician I was immediately available for consultation/collaboration.  Richarda Blade, MD 03/06/13 0000

## 2013-03-13 ENCOUNTER — Ambulatory Visit (INDEPENDENT_AMBULATORY_CARE_PROVIDER_SITE_OTHER): Payer: Medicaid Other | Admitting: Adult Health

## 2013-03-13 ENCOUNTER — Encounter: Payer: Self-pay | Admitting: Adult Health

## 2013-03-13 ENCOUNTER — Telehealth: Payer: Self-pay | Admitting: *Deleted

## 2013-03-13 VITALS — BP 130/54 | Ht 62.0 in | Wt 138.0 lb

## 2013-03-13 DIAGNOSIS — Z3201 Encounter for pregnancy test, result positive: Secondary | ICD-10-CM

## 2013-03-13 LAB — POCT URINE PREGNANCY: PREG TEST UR: POSITIVE

## 2013-03-13 NOTE — Progress Notes (Signed)
Patient ID: Deanna Hughes, female   DOB: [birth year], 23 y.o.   MRN: [id number] Pt here for pregnancy test, resulted positive. Pt to return in 10 days to have u/s and see Derrek Monaco, NP.

## 2013-03-13 NOTE — Telephone Encounter (Signed)
Has had some cramps no bleeding keep appt

## 2013-03-14 ENCOUNTER — Ambulatory Visit: Payer: Medicaid Other | Admitting: Adult Health

## 2013-03-18 ENCOUNTER — Emergency Department (HOSPITAL_COMMUNITY): Payer: Medicaid Other

## 2013-03-18 ENCOUNTER — Emergency Department (HOSPITAL_COMMUNITY)
Admission: EM | Admit: 2013-03-18 | Discharge: 2013-03-18 | Disposition: A | Discharge status: Home / Self Care | Payer: Medicaid Other | Attending: Emergency Medicine | Admitting: Emergency Medicine

## 2013-03-18 ENCOUNTER — Encounter (HOSPITAL_COMMUNITY): Payer: Self-pay | Admitting: Emergency Medicine

## 2013-03-18 DIAGNOSIS — O200 Threatened abortion: Secondary | ICD-10-CM

## 2013-03-18 DIAGNOSIS — Z8619 Personal history of other infectious and parasitic diseases: Secondary | ICD-10-CM | POA: Insufficient documentation

## 2013-03-18 DIAGNOSIS — Z8659 Personal history of other mental and behavioral disorders: Secondary | ICD-10-CM | POA: Insufficient documentation

## 2013-03-18 DIAGNOSIS — Z8739 Personal history of other diseases of the musculoskeletal system and connective tissue: Secondary | ICD-10-CM | POA: Insufficient documentation

## 2013-03-18 LAB — CBC WITH DIFFERENTIAL/PLATELET
Basophils Absolute: 0 10*3/uL (ref 0.0–0.1)
Basophils Relative: 0 % (ref 0–1)
EOS PCT: 1 % (ref 0–5)
Eosinophils Absolute: 0.1 10*3/uL (ref 0.0–0.7)
HEMATOCRIT: 36.3 % (ref 36.0–46.0)
Hemoglobin: 12.4 g/dL (ref 12.0–15.0)
LYMPHS ABS: 2.1 10*3/uL (ref 0.7–4.0)
LYMPHS PCT: 30 % (ref 12–46)
MCH: 30.6 pg (ref 26.0–34.0)
MCHC: 34.2 g/dL (ref 30.0–36.0)
MCV: 89.6 fL (ref 78.0–100.0)
MONO ABS: 0.4 10*3/uL (ref 0.1–1.0)
Monocytes Relative: 6 % (ref 3–12)
Neutro Abs: 4.2 10*3/uL (ref 1.7–7.7)
Neutrophils Relative %: 62 % (ref 43–77)
Platelets: 193 10*3/uL (ref 150–400)
RBC: 4.05 MIL/uL (ref 3.87–5.11)
RDW: 12.4 % (ref 11.5–15.5)
WBC: 6.8 10*3/uL (ref 4.0–10.5)

## 2013-03-18 LAB — BASIC METABOLIC PANEL
BUN: 12 mg/dL (ref 6–23)
CHLORIDE: 101 meq/L (ref 96–112)
CO2: 27 meq/L (ref 19–32)
CREATININE: 0.7 mg/dL (ref 0.50–1.10)
Calcium: 9.7 mg/dL (ref 8.4–10.5)
GFR calc Af Amer: >90 mL/min (ref >90)
GFR calc non Af Amer: >90 mL/min (ref >90)
GLUCOSE: 91 mg/dL (ref 70–99)
Potassium: 3.9 mEq/L (ref 3.7–5.3)
Sodium: 137 mEq/L (ref 137–147)

## 2013-03-18 LAB — HCG, QUANTITATIVE, PREGNANCY: hCG, Beta Chain, Quant, S: 3714 m[IU]/mL — ABNORMAL HIGH (ref <5)

## 2013-03-18 NOTE — ED Notes (Signed)
[redacted] weeks pregnant, started having vaginal bleeding and low abd pain 1 hour pta.  No NVD

## 2013-03-18 NOTE — ED Provider Notes (Signed)
CSN: [id number]     Arrival date & time 03/18/13  1706 History   First MD Initiated Contact with Patient 03/18/13 1945 This chart was scribed for Deanna Relic, MD by Anastasia Pall, ED Scribe. This patient was seen in room APA17/APA17 and the patient's care was started at 7:50 PM.   Chief Complaint  Patient presents with  . Vaginal Bleeding    (Consider location/radiation/quality/duration/timing/severity/associated sxs/prior Treatment) The history is provided by the patient. No language interpreter was used.   HPI Comments: Deanna Hughes is a 23 y.o. female, [redacted] weeks pregnant, who presents to the Emergency Department complaining of vaginal bleeding and lower abdominal pain, onset 1 hour PTA. She denies noticing any tissue or vaginal discharge with her bleeding. She reports mild, intermittent, cramping abdominal pain last week. She states this is her 4th pregnancy. She has 2 children, states her last pregnancy was a miscarriage. She denies vaginal discharge, burning with urination, fever, fainting, chest pain, and any other associated symptoms. Blood type O positive per prior lab results.  Past Medical History  Diagnosis Date  . Right sided abdominal pain   . Scoliosis   . No pertinent past medical history   . Chlamydia     treated 6/6  . Depression   . Yeast infection 07/03/2012  . Seasonal allergies 07/03/2012  . Vaginal discharge 10/03/2012  . Contraceptive management 01/25/2013  . Pregnant   . Threatened abortion in early pregnancy 03/20/2013   History reviewed. No pertinent past surgical history. Family History  Problem Relation Age of Onset  . Asthma Mother   . Asthma Sister   . Asthma Brother   . Asthma Daughter   . Diabetes Maternal Grandmother   . Hypertension Maternal Grandmother   . Asthma Maternal Grandmother   . Heart disease Maternal Grandmother   . Stroke Maternal Grandmother   . Hypertension Maternal Grandfather   . Asthma Maternal Grandfather   . Heart disease  Maternal Grandfather   . Asthma Son    History  Substance Use Topics  . Smoking status: Never Smoker   . Smokeless tobacco: Never Used  . Alcohol Use: No   OB History   Grav Para Term Preterm Abortions TAB SAB Ect Mult Living   4 2 2  0 1 0 1 0 0 2     Review of Systems 10 Systems reviewed and all are negative for acute change except as noted in the HPI.  Allergies  Review of patient's allergies indicates no known allergies.  Home Medications  No current outpatient prescriptions on file.  BP 110/60  Pulse 78  Temp(Src) 98.4 F (36.9 C) (Oral)  Resp 18  Ht 5\' 2"  (1.575 m)  Wt 132 lb (59.875 kg)  BMI 24.14 kg/m2  SpO2 100%  LMP 01/30/2013  Physical Exam  Nursing note and vitals reviewed. Constitutional:  Awake, alert, nontoxic appearance.  HENT:  Head: Atraumatic.  Eyes: Right eye exhibits no discharge. Left eye exhibits no discharge.  Neck: Neck supple.  Cardiovascular: Normal rate, regular rhythm and normal heart sounds.  Exam reveals no gallop and no friction rub.   No murmur heard. Pulmonary/Chest: Effort normal and breath sounds normal. No respiratory distress. She has no wheezes. She has no rales. She exhibits no tenderness.  Abdominal: Soft. Bowel sounds are normal. She exhibits no distension. There is no tenderness. There is no rebound.  Musculoskeletal: She exhibits no tenderness.  Baseline ROM, no obvious new focal weakness.  Neurological:  Mental status and  motor strength appears baseline for patient and situation.  Skin: No rash noted.  Psychiatric: She has a normal mood and affect.   ED Course  Procedures (including critical care time)  DIAGNOSTIC STUDIES: Oxygen Saturation is 100% on room air, normal by my interpretation.    COORDINATION OF CARE: 7:55 PM - Patient / Family / Caregiver understand and agree with initial ED impression and plan with expectations set for ED visit.  7:57: Performed US. Will order [place] to help rule out ectopic  pregnancy.   10:24 PM -  Chaperone present upon bimanual exam. No tenderness, internal os closed. No bleeding or discharge noted on examination glove. Will discharge pt.  Patient / Family / Caregiver informed of clinical course, understand medical decision-making process, and agree with plan.  Results for orders placed during the hospital encounter of [id number]  BASIC METABOLIC PANEL      Result Value Ref Range   Sodium 137  137 - 147 mEq/L   Potassium 3.9  3.7 - 5.3 mEq/L   Chloride 101  96 - 112 mEq/L   CO2 27  19 - 32 mEq/L   Glucose, Bld 91  70 - 99 mg/dL   BUN 12  6 - 23 mg/dL   Creatinine, Ser 0.70  0.50 - 1.10 mg/dL   Calcium 9.7  8.4 - 10.5 mg/dL   GFR calc non Af Amer >90  >90 mL/min   GFR calc Af Amer >90  >90 mL/min  CBC WITH DIFFERENTIAL      Result Value Ref Range   WBC 6.8  4.0 - 10.5 K/uL   RBC 4.05  3.87 - 5.11 MIL/uL   Hemoglobin 12.4  12.0 - 15.0 g/dL   HCT 36.3  36.0 - 46.0 %   MCV 89.6  78.0 - 100.0 fL   MCH 30.6  26.0 - 34.0 pg   MCHC 34.2  30.0 - 36.0 g/dL   RDW 12.4  11.5 - 15.5 %   Platelets 193  150 - 400 K/uL   Neutrophils Relative % 62  43 - 77 %   Neutro Abs 4.2  1.7 - 7.7 K/uL   Lymphocytes Relative 30  12 - 46 %   Lymphs Abs 2.1  0.7 - 4.0 K/uL   Monocytes Relative 6  3 - 12 %   Monocytes Absolute 0.4  0.1 - 1.0 K/uL   Eosinophils Relative 1  0 - 5 %   Eosinophils Absolute 0.1  0.0 - 0.7 K/uL   Basophils Relative 0  0 - 1 %   Basophils Absolute 0.0  0.0 - 0.1 K/uL  HCG, QUANTITATIVE, PREGNANCY      Result Value Ref Range   hCG, Beta Chain, Quant, S 3714 (*) <5 mIU/mL   US Ob Comp Less 14 Wks  03/18/2013   CLINICAL DATA:  Bleeding.  EXAM: OBSTETRIC <14 WK [place] AND TRANSVAGINAL OB US  TECHNIQUE: Both transabdominal and transvaginal ultrasound examinations were performed for complete evaluation of the gestation as well as the maternal uterus, adnexal regions, and pelvic cul-de-sac. Transvaginal technique was performed to assess early pregnancy.   COMPARISON:  11/05/2011 pelvic sonogram.  FINDINGS: Intrauterine gestational sac: Visualized/normal in shape.  Yolk sac:  Not visualized.  Embryo:  Not visualized.  Cardiac Activity: Not visualized.  MSD:  8.2 mm  mm   5 w   4  d  Maternal uterus/adnexae: Unremarkable.  IMPRESSION: Probable early intrauterine gestational sac, but no yolk sac, fetal pole, or cardiac activity  yet visualized. Recommend follow-up quantitative B-HCG levels and follow-up US in 14 days to confirm and assess viability. This recommendation follows SRU consensus guidelines: Diagnostic Criteria for Nonviable Pregnancy Early in the First Trimester. [place] Med 2013; 037:0488-89.   Electronically Signed   By: Chauncey Cruel M.D.   On: 03/18/2013 21:42   US Ob Transvaginal  03/18/2013   CLINICAL DATA:  Bleeding.  EXAM: OBSTETRIC <14 WK [place] AND TRANSVAGINAL OB US  TECHNIQUE: Both transabdominal and transvaginal ultrasound examinations were performed for complete evaluation of the gestation as well as the maternal uterus, adnexal regions, and pelvic cul-de-sac. Transvaginal technique was performed to assess early pregnancy.  COMPARISON:  11/05/2011 pelvic sonogram.  FINDINGS: Intrauterine gestational sac: Visualized/normal in shape.  Yolk sac:  Not visualized.  Embryo:  Not visualized.  Cardiac Activity: Not visualized.  MSD:  8.2 mm  mm   5 w   4  d  Maternal uterus/adnexae: Unremarkable.  IMPRESSION: Probable early intrauterine gestational sac, but no yolk sac, fetal pole, or cardiac activity yet visualized. Recommend follow-up quantitative B-HCG levels and follow-up US in 14 days to confirm and assess viability. This recommendation follows SRU consensus guidelines: Diagnostic Criteria for Nonviable Pregnancy Early in the First Trimester. [employer] Med 2013; 169:4503-88.   Electronically Signed   By: Chauncey Cruel M.D.   On: 03/18/2013 21:42    Medications - No data to display  MDM   Final diagnoses:  Threatened miscarriage in early  pregnancy   I doubt any other EMC precluding discharge at this time. Feel Pt at low risk for ectopic pregnancy. I personally performed the services described in this documentation, which was scribed in my presence. The recorded information has been reviewed and is accurate.    Deanna Relic, MD 03/21/13 2116

## 2013-03-18 NOTE — Discharge Instructions (Signed)
Bleeding during the first 20 weeks of pregnancy is common. This is sometimes called a threatened miscarriage. This is a pregnancy that is threatening to end before the twentieth week of pregnancy.  Miscarriages occur in 31 to 20% of all pregnancies and usually occur during the first 13 weeks of the pregnancy. The exact cause of a miscarriage is usually never known. A miscarriage is natures way of ending a pregnancy that is abnormal or would not make it to term.  HOME CARE INSTRUCTIONS DO NOT USE TAMPONS. Do not douche, have sexual intercourse or orgasms until approved by your caregiver.  Call for re-evaluation of your pregnancy and possible repeat blood test. Re-evaluation often occurs after 2 days.  If you are Rh negative and the father is Rh positive or you do not know the fathers blood type, you may receive a shot (Rh immune globulin) to help prevent abnormal antibodies that can develop and affect the baby in any future pregnancies. SEEK IMMEDIATE MEDICAL ATTENTION IF: You have severe cramps in your stomach, back, or abdomen.  You have a sudden onset of severe pain in the lower part of your abdomen.  You run an unexplained temperature of 101 F (38.3 C) or higher.  You pass large clots or tissue. Save any tissue for your caregiver to inspect.  Your bleeding increases or you become light-headed, weak, or have fainting episodes.   You may still have an ectopic or tubal pregnancy since your gestational sac was empty. This means your pregnancy may have implanted outside the uterus.  HCG hormone levels in the blood can help diagnose ectopic pregnancies. Repeat testing is often necessary. SEEK IMMEDIATE MEDICAL CARE IF YOU DEVELOP: Severe pain in the pelvis, abdomen, back, flank, or shoulder.  Extreme weakness, fainting, repeated vomiting, dehydration, or fever.  Heavy vaginal bleeding or passage of tissue.

## 2013-03-19 ENCOUNTER — Other Ambulatory Visit: Payer: Self-pay | Admitting: Adult Health

## 2013-03-19 ENCOUNTER — Telehealth: Payer: Self-pay | Admitting: Obstetrics and Gynecology

## 2013-03-19 DIAGNOSIS — O3680X0 Pregnancy with inconclusive fetal viability, not applicable or unspecified: Secondary | ICD-10-CM

## 2013-03-19 NOTE — Telephone Encounter (Signed)
Pt states was in ER last night for vaginal bleeding in pregnancy. Pt states ultrasound done to see if pregnancy was in tubes or uterus, pt states was to f/u with our office per [facility]. Per Derrek Monaco, NP, pt to be seen tomorrow for ultrasound, [facility], and see Anderson Malta. Pt verbalized understanding.

## 2013-03-20 ENCOUNTER — Ambulatory Visit (INDEPENDENT_AMBULATORY_CARE_PROVIDER_SITE_OTHER): Payer: Medicaid Other

## 2013-03-20 ENCOUNTER — Ambulatory Visit (INDEPENDENT_AMBULATORY_CARE_PROVIDER_SITE_OTHER): Payer: Medicaid Other | Admitting: Adult Health

## 2013-03-20 ENCOUNTER — Other Ambulatory Visit: Payer: Self-pay | Admitting: Adult Health

## 2013-03-20 ENCOUNTER — Encounter: Payer: Self-pay | Admitting: Adult Health

## 2013-03-20 ENCOUNTER — Encounter: Payer: Medicaid Other | Admitting: Adult Health

## 2013-03-20 VITALS — BP 100/62 | Ht 62.0 in | Wt 138.0 lb

## 2013-03-20 DIAGNOSIS — O9989 Other specified diseases and conditions complicating pregnancy, childbirth and the puerperium: Secondary | ICD-10-CM

## 2013-03-20 DIAGNOSIS — O99891 Other specified diseases and conditions complicating pregnancy: Secondary | ICD-10-CM

## 2013-03-20 DIAGNOSIS — O200 Threatened abortion: Secondary | ICD-10-CM

## 2013-03-20 DIAGNOSIS — O3680X0 Pregnancy with inconclusive fetal viability, not applicable or unspecified: Secondary | ICD-10-CM

## 2013-03-20 HISTORY — DX: Threatened abortion: O20.0

## 2013-03-20 NOTE — Patient Instructions (Signed)
Threatened Miscarriage Bleeding during the first 20 weeks of pregnancy is common. This is sometimes called a threatened miscarriage. This is a pregnancy that is threatening to end before the twentieth week of pregnancy. Often this bleeding stops with bed rest or decreased activities as suggested by your caregiver and the pregnancy continues without any more problems. You may be asked to not have sexual intercourse, have orgasms or use tampons until further notice. Sometimes a threatened miscarriage can progress to a complete or incomplete miscarriage. This may or may not require further treatment. Some miscarriages occur before a woman misses a menstrual period and knows she is pregnant. Miscarriages occur in 43 to 20% of all pregnancies and usually occur during the first 13 weeks of the pregnancy. The exact cause of a miscarriage is usually never known. A miscarriage is natures way of ending a pregnancy that is abnormal or would not make it to term. There are some things that may put you at risk to have a miscarriage, such as:  Hormone problems.  Infection of the uterus or cervix.  Chronic illness, diabetes for example, especially if it is not controlled.  Abnormal shaped uterus.  Fibroids in the uterus.  Incompetent cervix (the cervix is too weak to hold the baby).  Smoking.  Drinking too much alcohol. It's best not to drink any alcohol when you are pregnant.  Taking illegal drugs. TREATMENT  When a miscarriage becomes complete and all products of conception (all the tissue in the uterus) have been passed, often no treatment is needed. If you think you passed tissue, save it in a container and take it to your doctor for evaluation. If the miscarriage is incomplete (parts of the fetus or placenta remain in the uterus), further treatment may be needed. The most common reason for further treatment is continued bleeding (hemorrhage) because pregnancy tissue did not pass out of the uterus. This  often occurs if a miscarriage is incomplete. Tissue left behind may also become infected. Treatment usually is dilatation and curettage (the removal of the remaining products of pregnancy. This can be done by a simple sucking procedure (suction curettage) or a simple scraping of the inside of the uterus. This may be done in the hospital or in the caregiver's office. This is only done when your caregiver knows that there is no chance for the pregnancy to proceed to term. This is determined by physical examination, negative pregnancy test, falling pregnancy hormone count and/or, an ultrasound revealing a dead fetus. Miscarriages are often a very emotional time for prospective mothers and fathers. This is not you or your partners fault. It did not occur because of an inadequacy in you or your partner. Nearly all miscarriages occur because the pregnancy has started off wrongly. At least half of these pregnancies have a chromosomal abnormality. It is almost always not inherited. Others may have developmental problems with the fetus or placenta. This does not always show up even when the products miscarried are studied under the microscope. The miscarriage is nearly always not your fault and it is not likely that you could have prevented it from happening. If you are having emotional and grieving problems, talk to your health care provider and even seek counseling, if necessary, before getting pregnant again. You can begin trying for another pregnancy as soon as your caregiver says it is OK. HOME CARE INSTRUCTIONS   Your caregiver may order bed rest depending on how much bleeding and cramping you are having. You may be limited  to only getting up to go to the bathroom. You may be allowed to continue light activity. You may need to make arrangements for the care of your other children and for any other responsibilities.  Keep track of the number of pads you use each day, how often you have to change pads and how  saturated (soaked) they are. Record this information.  DO NOT USE TAMPONS. Do not douche, have sexual intercourse or orgasms until approved by your caregiver.  You may receive a follow up appointment for re-evaluation of your pregnancy and a repeat blood test. Re-evaluation often occurs after 2 days and again in 4 to 6 weeks. It is very important that you follow-up in the recommended time period.  If you are Rh negative and the father is Rh positive or you do not know the fathers' blood type, you may receive a shot (Rh immune globulin) to help prevent abnormal antibodies that can develop and affect the baby in any future pregnancies. SEEK IMMEDIATE MEDICAL CARE IF:  You have severe cramps in your stomach, back, or abdomen.  You have a sudden onset of severe pain in the lower part of your abdomen.  You develop chills.  You run an unexplained temperature of 101 F (38.3 C) or higher.  You pass large clots or tissue. Save any tissue for your caregiver to inspect.  Your bleeding increases or you become light-headed, weak, or have fainting episodes.  You have a gush of fluid from your vagina.  You pass out. This could mean you have a tubal (ectopic) pregnancy. Document Released: 01/03/2005 Document Revised: 03/28/2011 Document Reviewed: 08/20/2007 [facility] Patient Information 2014 [place]. Return in 1 week

## 2013-03-20 NOTE — Progress Notes (Signed)
Subjective:     Patient ID: Deanna Hughes, female   DOB: [birth year], 23 y.o.   MRN: [id number]  HPI Deanna Hughes is a 23 year old black female in for [place], she was seen 2 days ago in ER for pain and bleeding.Still bleeding today.Had [facility]14 two days ago.Blood type 0+.  Review of Systems See HPI Reviewed past medical,surgical, social and family history. Reviewed medications and allergies.     Objective:   Physical Exam BP 100/62  Ht 5\' 2"  (1.575 m)  Wt 138 lb (62.596 kg)  BMI 25.23 kg/m2  LMP 01/30/2013   Has some fluid in uterus, no YS or fetal pole seen or adnexal  Mass  Assessment:     Threatened miscarriage in early pregnancy    Plan:     Check QHCG return in 1 week   If miscarriage wants nexplanon asap Review handout on threatened abortion

## 2013-03-21 ENCOUNTER — Telehealth: Payer: Self-pay | Admitting: Adult Health

## 2013-03-21 LAB — HCG, QUANTITATIVE, PREGNANCY: hCG, Beta Chain, Quant, S: 4499 m[IU]/mL

## 2013-03-21 NOTE — Telephone Encounter (Signed)
Pt aware numbers increased, will re check in 1 week, pt knows this is probably a miscarriage

## 2013-03-25 ENCOUNTER — Other Ambulatory Visit: Payer: Medicaid Other

## 2013-03-25 ENCOUNTER — Encounter: Payer: Medicaid Other | Admitting: Adult Health

## 2013-03-27 ENCOUNTER — Ambulatory Visit (INDEPENDENT_AMBULATORY_CARE_PROVIDER_SITE_OTHER): Payer: Medicaid Other | Admitting: Adult Health

## 2013-03-27 ENCOUNTER — Encounter: Payer: Self-pay | Admitting: Adult Health

## 2013-03-27 VITALS — BP 98/52 | Ht 62.0 in | Wt 136.5 lb

## 2013-03-27 DIAGNOSIS — O039 Complete or unspecified spontaneous abortion without complication: Secondary | ICD-10-CM

## 2013-03-27 HISTORY — DX: Complete or unspecified spontaneous abortion without complication: O03.9

## 2013-03-27 NOTE — Patient Instructions (Signed)
Miscarriage A miscarriage is the sudden loss of an unborn baby (fetus) before the 20th week of pregnancy. Most miscarriages happen in the first 3 months of pregnancy. Sometimes, it happens before a woman even knows she is pregnant. A miscarriage is also called a "spontaneous miscarriage" or "early pregnancy loss." Having a miscarriage can be an emotional experience. Talk with your caregiver about any questions you may have about miscarrying, the grieving process, and your future pregnancy plans. CAUSES   Problems with the fetal chromosomes that make it impossible for the baby to develop normally. Problems with the baby's genes or chromosomes are most often the result of errors that occur, by chance, as the embryo divides and grows. The problems are not inherited from the parents.  Infection of the cervix or uterus.   Hormone problems.   Problems with the cervix, such as having an incompetent cervix. This is when the tissue in the cervix is not strong enough to hold the pregnancy.   Problems with the uterus, such as an abnormally shaped uterus, uterine fibroids, or congenital abnormalities.   Certain medical conditions.   Smoking, drinking alcohol, or taking illegal drugs.   Trauma.  Often, the cause of a miscarriage is unknown.  SYMPTOMS   Vaginal bleeding or spotting, with or without cramps or pain.  Pain or cramping in the abdomen or lower back.  Passing fluid, tissue, or blood clots from the vagina. DIAGNOSIS  Your caregiver will perform a physical exam. You may also have an ultrasound to confirm the miscarriage. Blood or urine tests may also be ordered. TREATMENT   Sometimes, treatment is not necessary if you naturally pass all the fetal tissue that was in the uterus. If some of the fetus or placenta remains in the body (incomplete miscarriage), tissue left behind may become infected and must be removed. Usually, a dilation and curettage (D and C) procedure is performed.  During a D and C procedure, the cervix is widened (dilated) and any remaining fetal or placental tissue is gently removed from the uterus.  Antibiotic medicines are prescribed if there is an infection. Other medicines may be given to reduce the size of the uterus (contract) if there is a lot of bleeding.  If you have Rh negative blood and your baby was Rh positive, you will need a Rh immunoglobulin shot. This shot will protect any future baby from having Rh blood problems in future pregnancies. HOME CARE INSTRUCTIONS   Your caregiver may order bed rest or may allow you to continue light activity. Resume activity as directed by your caregiver.  Have someone help with home and family responsibilities during this time.   Keep track of the number of sanitary pads you use each day and how soaked (saturated) they are. Write down this information.   Do not use tampons. Do not douche or have sexual intercourse until approved by your caregiver.   Only take over-the-counter or prescription medicines for pain or discomfort as directed by your caregiver.   Do not take aspirin. Aspirin can cause bleeding.   Keep all follow-up appointments with your caregiver.   If you or your partner have problems with grieving, talk to your caregiver or seek counseling to help cope with the pregnancy loss. Allow enough time to grieve before trying to get pregnant again.  SEEK IMMEDIATE MEDICAL CARE IF:   You have severe cramps or pain in your back or abdomen.  You have a fever.  You pass large blood clots (walnut-sized   or larger) ortissue from your vagina. Save any tissue for your caregiver to inspect.   Your bleeding increases.   You have a thick, bad-smelling vaginal discharge.  You become lightheaded, weak, or you faint.   You have chills.  MAKE SURE YOU:  Understand these instructions.  Will watch your condition.  Will get help right away if you are not doing well or get  worse. Document Released: 06/29/2000 Document Revised: 04/30/2012 Document Reviewed: 02/22/2011 [facility] Patient Information 2014 [place]. NO SEX Call in am

## 2013-03-27 NOTE — Progress Notes (Signed)
Subjective:     Patient ID: Deanna Hughes, female   DOB: 21-Sep-1990, 21 y.o.   MRN: [id number]  HPI Deanna Hughes is a 23 year old black female in having had bleeding last week, none now, no pain will check [facility] today.  Review of Systems See HPI Reviewed past medical,surgical, social and family history. Reviewed medications and allergies.     Objective:   Physical Exam BP 98/52  Ht 5\' 2"  (1.575 m)  Wt 136 lb 8 oz (61.916 kg)  BMI 24.96 kg/m2  LMP 01/30/2013   Had bleeding and clots last week, wants nexplanon ASAP  Assessment:     Miscarriage    Plan:    Check QHCG No sex   Review handout on miscarriage Will talk in am

## 2013-03-28 LAB — HCG, QUANTITATIVE, PREGNANCY: hCG, Beta Chain, Quant, S: 112.2 m[IU]/mL

## 2013-03-29 ENCOUNTER — Telehealth: Payer: Self-pay | Admitting: Adult Health

## 2013-03-29 NOTE — Telephone Encounter (Signed)
Pt aware numbers down recheck in 2 weeks, NO sex

## 2013-04-11 ENCOUNTER — Ambulatory Visit (INDEPENDENT_AMBULATORY_CARE_PROVIDER_SITE_OTHER): Payer: Medicaid Other | Admitting: Adult Health

## 2013-04-11 ENCOUNTER — Other Ambulatory Visit: Payer: Medicaid Other

## 2013-04-11 ENCOUNTER — Encounter (INDEPENDENT_AMBULATORY_CARE_PROVIDER_SITE_OTHER): Payer: Self-pay

## 2013-04-11 ENCOUNTER — Encounter: Payer: Self-pay | Admitting: Adult Health

## 2013-04-11 VITALS — BP 120/74 | Ht 62.0 in | Wt 132.0 lb

## 2013-04-11 DIAGNOSIS — Z3200 Encounter for pregnancy test, result unknown: Secondary | ICD-10-CM

## 2013-04-11 DIAGNOSIS — Z30017 Encounter for initial prescription of implantable subdermal contraceptive: Secondary | ICD-10-CM

## 2013-04-11 HISTORY — DX: Encounter for initial prescription of implantable subdermal contraceptive: Z30.017

## 2013-04-11 LAB — HCG, QUANTITATIVE, PREGNANCY

## 2013-04-11 NOTE — Patient Instructions (Signed)
Use condoms x 2 weeks, keep clean and dry x 24 hours, no heavy lifting, keep steri strips on x 72 hours, Keep pressure dressing on x 24 hours. Follow up prn problems.  

## 2013-04-11 NOTE — Progress Notes (Signed)
Patient ID: Deanna Hughes, female   DOB: [birth year], 23 y.o.   MRN: [id number] Pt had QHCG this morning and it was negative.

## 2013-04-11 NOTE — Progress Notes (Addendum)
Subjective:     Patient ID: [name], female   DOB: 1990/05/23, 23 y.o.   MRN: [id number]  HPI Sheldon is a 23 year old black female in for nexplanon insertion, she recently had miscarriage.Her QHCG was <1 this am.  Review of Systems See HPI Reviewed past medical,surgical, social and family history. Reviewed medications and allergies.     Objective:   Physical Exam BP 120/74  Ht 5\' 2"  (1.575 m)  Wt 132 lb (59.875 kg)  BMI 24.14 kg/m2  LMP 01/30/2013  Breastfeeding? NoConsent signed,time out called, Left arm cleansed with betadine, and injected with 1.5 cc 2% lidocaine and waited til numb. Nexplanon easily inserted and steri strips applied.Palpated rod by provider and pt. Pressure dressing applied.    Assessment:     Nexplanon insertion  Lot number [id number] exp 7/16     Plan:     Use condoms x 2 weeks, keep clean and dry x 24 hours, no heavy lifting, keep steri strips on x 72 hours, Keep pressure dressing on x 24 hours. Follow up prn problems.

## 2013-04-13 ENCOUNTER — Emergency Department (HOSPITAL_COMMUNITY)
Admission: EM | Admit: 2013-04-13 | Discharge: 2013-04-13 | Disposition: A | Discharge status: Home / Self Care | Payer: Medicaid Other | Attending: Emergency Medicine | Admitting: Emergency Medicine

## 2013-04-13 ENCOUNTER — Encounter (HOSPITAL_COMMUNITY): Payer: Self-pay | Admitting: Emergency Medicine

## 2013-04-13 DIAGNOSIS — Y838 Other surgical procedures as the cause of abnormal reaction of the patient, or of later complication, without mention of misadventure at the time of the procedure: Secondary | ICD-10-CM | POA: Insufficient documentation

## 2013-04-13 DIAGNOSIS — K08109 Complete loss of teeth, unspecified cause, unspecified class: Secondary | ICD-10-CM | POA: Insufficient documentation

## 2013-04-13 DIAGNOSIS — K029 Dental caries, unspecified: Secondary | ICD-10-CM | POA: Insufficient documentation

## 2013-04-13 DIAGNOSIS — Z8739 Personal history of other diseases of the musculoskeletal system and connective tissue: Secondary | ICD-10-CM | POA: Insufficient documentation

## 2013-04-13 DIAGNOSIS — Z8659 Personal history of other mental and behavioral disorders: Secondary | ICD-10-CM | POA: Insufficient documentation

## 2013-04-13 DIAGNOSIS — K0889 Other specified disorders of teeth and supporting structures: Secondary | ICD-10-CM

## 2013-04-13 DIAGNOSIS — K089 Disorder of teeth and supporting structures, unspecified: Secondary | ICD-10-CM | POA: Insufficient documentation

## 2013-04-13 MED ORDER — AMOXICILLIN 500 MG PO CAPS: 500.0000 mg | ORAL_CAPSULE | Freq: Three times a day (TID) | ORAL | Status: DC

## 2013-04-13 MED ORDER — TRAMADOL HCL 50 MG PO TABS: 50.0000 mg | ORAL_TABLET | Freq: Four times a day (QID) | ORAL | Status: DC | PRN

## 2013-04-13 NOTE — Discharge Instructions (Signed)
Dental Pain Toothache is pain in or around a tooth. It may get worse with chewing or with cold or heat.  HOME CARE  Your dentist may use a numbing medicine during treatment. If so, you may need to avoid eating until the medicine wears off. Ask your dentist about this.  Only take medicine as told by your dentist or doctor.  Avoid chewing food near the painful tooth until after all treatment is done. Ask your dentist about this. GET HELP RIGHT AWAY IF:   The problem gets worse or new problems appear.  You have a fever.  There is redness and puffiness (swelling) of the face, jaw, or neck.  You cannot open your mouth.  There is pain in the jaw.  There is very bad pain that is not helped by medicine. MAKE SURE YOU:   Understand these instructions.  Will watch your condition.  Will get help right away if you are not doing well or get worse. Document Released: 06/22/2007 Document Revised: 03/28/2011 Document Reviewed: 06/22/2007 [facility] Patient Information 2014 [place], [place].

## 2013-04-13 NOTE — ED Notes (Signed)
Pt states that she had her wisdom teeth removed this past Monday by surgeon in [place] Dr Buelah Manis, noticed this am that she had white pus coming from left side,

## 2013-04-13 NOTE — ED Provider Notes (Signed)
CSN: [id number]     Arrival date & time 04/13/13  1346 History   First MD Initiated Contact with Patient 04/13/13 1410     Chief Complaint  Patient presents with  . Dental Pain     (Consider location/radiation/quality/duration/timing/severity/associated sxs/prior Treatment) Patient is a 23 y.o. female presenting with tooth pain. The history is provided by the patient.  Dental Pain Location:  Upper and lower Upper teeth location:  16/LU 3rd molar Lower teeth location:  17/LL 3rd molar and 19/LL 1st molar Quality:  Throbbing Severity:  Moderate Onset quality:  Gradual Duration:  5 days Timing:  Constant Progression:  Unchanged Chronicity:  New Context: recent dental surgery   Context comment:  Recent dental extraction of three teeth Relieved by:  Nothing Worsened by:  Cold food/drink and hot food/drink Ineffective treatments:  NSAIDs Associated symptoms: gum swelling   Associated symptoms: no congestion, no facial pain, no facial swelling, no fever, no headaches, no neck pain, no oral bleeding, no oral lesions and no trismus   Associated symptoms comment:  Tenderness of the gums and drainage of the extraction sites of the lower molars Risk factors: no diabetes and no smoking     Past Medical History  Diagnosis Date  . Right sided abdominal pain   . Scoliosis   . No pertinent past medical history   . Chlamydia     treated 6/6  . Depression   . Yeast infection 07/03/2012  . Seasonal allergies 07/03/2012  . Vaginal discharge 10/03/2012  . Contraceptive management 01/25/2013  . Pregnant   . Threatened abortion in early pregnancy 03/20/2013  . Miscarriage 03/27/2013  . Nexplanon insertion 04/11/2013    nexplanon inserted 04/11/13 remove 3/32/18   History reviewed. No pertinent past surgical history. Family History  Problem Relation Age of Onset  . Asthma Mother   . Asthma Sister   . Asthma Brother   . Asthma Daughter   . Diabetes Maternal Grandmother   . Hypertension  Maternal Grandmother   . Asthma Maternal Grandmother   . Heart disease Maternal Grandmother   . Stroke Maternal Grandmother   . Hypertension Maternal Grandfather   . Asthma Maternal Grandfather   . Heart disease Maternal Grandfather   . Asthma Son    History  Substance Use Topics  . Smoking status: Never Smoker   . Smokeless tobacco: Never Used  . Alcohol Use: No   OB History   Grav Para Term Preterm Abortions TAB SAB Ect Mult Living   4 2 2  0 1 0 1 0 0 2     Review of Systems  Constitutional: Negative for fever and appetite change.  HENT: Positive for dental problem. Negative for congestion, facial swelling, mouth sores, sore throat and trouble swallowing.   Eyes: Negative for pain and visual disturbance.  Musculoskeletal: Negative for neck pain and neck stiffness.  Neurological: Negative for dizziness, facial asymmetry and headaches.  Hematological: Negative for adenopathy.  All other systems reviewed and are negative.      Allergies  Review of patient's allergies indicates no known allergies.  Home Medications   Current Outpatient Rx  Name  Route  Sig  Dispense  Refill  . etonogestrel (NEXPLANON) 68 MG IMPL implant   Subcutaneous   Inject 1 each into the skin once.          BP 128/73  Pulse 96  Temp(Src) 98.3 F (36.8 C) (Oral)  Resp 20  SpO2 100%  LMP 03/23/2013 Physical Exam  Nursing note  and vitals reviewed. Constitutional: She is oriented to person, place, and time. She appears well-developed and well-nourished. No distress.  HENT:  Head: Normocephalic and atraumatic.  Right Ear: Tympanic membrane and ear canal normal.  Left Ear: Tympanic membrane and ear canal normal.  Mouth/Throat: Uvula is midline, oropharynx is clear and moist and mucous membranes are normal. No trismus in the jaw. Dental caries present. No dental abscesses or uvula swelling.    ttp of the left upper and lower extraction sites.  No bleeding, drainage or fluctuance of the gums   No facial swelling, obvious dental abscess, trismus, or sublingual abnml.    Neck: Normal range of motion. Neck supple.  Cardiovascular: Normal rate, regular rhythm and normal heart sounds.   No murmur heard. Pulmonary/Chest: Effort normal and breath sounds normal. No respiratory distress.  Musculoskeletal: Normal range of motion.  Lymphadenopathy:    She has no cervical adenopathy.  Neurological: She is alert and oriented to person, place, and time. She exhibits normal muscle tone. Coordination normal.  Skin: Skin is warm and dry.    ED Course  Procedures (including critical care time) Labs Review Labs Reviewed - No data to display Imaging Review No results found.   EKG Interpretation None      MDM   Final diagnoses:  Pain, dental    Patient with post extraction dental pain.  No obvious signs of infection, no active bleeding or evidence of dry socket.  Airway patent, neck non-edematous and non-tender.  Pt agrees to close f/u with her dentist on Monday.  VSS.  She appears stable for d/c and agrees to plan    Deanna Hughes L. Vanessa Klagetoh, PA-C 04/15/13 1616

## 2013-04-18 NOTE — ED Provider Notes (Signed)
Medical screening examination/treatment/procedure(s) were performed by non-physician practitioner and as supervising physician I was immediately available for consultation/collaboration.   EKG Interpretation None       Keyleigh Manninen, MD 04/18/13 0638 
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
## 2013-05-06 ENCOUNTER — Encounter: Payer: Self-pay | Admitting: Adult Health

## 2013-05-06 ENCOUNTER — Ambulatory Visit (INDEPENDENT_AMBULATORY_CARE_PROVIDER_SITE_OTHER): Payer: Medicaid Other | Admitting: Adult Health

## 2013-05-06 VITALS — BP 110/76 | Ht 62.0 in | Wt 139.0 lb

## 2013-05-06 DIAGNOSIS — O039 Complete or unspecified spontaneous abortion without complication: Secondary | ICD-10-CM

## 2013-05-06 NOTE — Progress Notes (Signed)
Subjective:     Patient ID: Deanna Hughes, female   DOB: Feb 03, 1990, 23 y.o.   MRN: [id number]  HPI Deanna Hughes is a 23 year old black female who recently had a miscarriage and got nexplanon inserted 04/11/13, she had bleeding 4/17 x 2 days then today had passed tissue that looks like a decidual cast, she has picture on phone..  Review of Systems See HPI Reviewed past medical,surgical, social and family history. Reviewed medications and allergies.     Objective:   Physical Exam BP 110/76  Ht 5\' 2"  (1.575 m)  Wt 139 lb (63.05 kg)  BMI 25.42 kg/m2  LMP 05/03/2013  Breastfeeding? NoUPT negative, Skin warm and dry.Pelvic: external genitalia is normal in appearance, vagina: red blood,like period blood in vault, cervix:smooth and bulbous, uterus: normal size, shape and contour, non tender, no masses felt, adnexa: no masses or tenderness noted.    Reassured.  Assessment:     Recent miscarriage    Plan:     Follow up prn

## 2013-05-30 ENCOUNTER — Telehealth: Payer: Self-pay | Admitting: Adult Health

## 2013-05-31 NOTE — Telephone Encounter (Signed)
Pt states that for the past two weeks straight she has been bleeding. Pt states that she was told that JAG would call her in medication if bleeding lasted for more than a week. Pt has the nexplanon.

## 2013-05-31 NOTE — Telephone Encounter (Signed)
Having bleeding with nexplanon to come in Monday to be seen, need to R/O STD first

## 2013-06-03 ENCOUNTER — Encounter: Payer: Self-pay | Admitting: Adult Health

## 2013-06-03 ENCOUNTER — Ambulatory Visit (INDEPENDENT_AMBULATORY_CARE_PROVIDER_SITE_OTHER): Payer: Medicaid Other | Admitting: Adult Health

## 2013-06-03 VITALS — BP 118/74 | Ht 62.0 in | Wt 138.0 lb

## 2013-06-03 DIAGNOSIS — N926 Irregular menstruation, unspecified: Secondary | ICD-10-CM

## 2013-06-03 DIAGNOSIS — Z3202 Encounter for pregnancy test, result negative: Secondary | ICD-10-CM

## 2013-06-03 HISTORY — DX: Irregular menstruation, unspecified: N92.6

## 2013-06-03 LAB — POCT URINE PREGNANCY: PREG TEST UR: NEGATIVE

## 2013-06-03 MED ORDER — MEGESTROL ACETATE 40 MG PO TABS: ORAL_TABLET | ORAL | Status: DC

## 2013-06-03 NOTE — Progress Notes (Signed)
Subjective:     Patient ID: Deanna Hughes, female   DOB: 04/13/1990, 23 y.o.   MRN: [id number]  HPI Kyann is a 23 year old black female in complaining of bleeding x 2 weeks, has nexplanon.  Review of Systems See HPI Reviewed past medical,surgical, social and family history. Reviewed medications and allergies.     Objective:   Physical Exam BP 118/74  Ht 5\' 2"  (1.575 m)  Wt 138 lb (62.596 kg)  BMI 25.23 kg/m2  LMP 05/21/2013  Breastfeeding? NoUPT negative   Skin warm and dry.Pelvic: external genitalia is normal in appearance, vagina: pink discharge without odor, cervix:smooth and bulbous, uterus: normal size, shape and contour, non tender, no masses felt, adnexa: no masses or tenderness noted.  GC/CHL obtained.  Assessment:     Irregular bleeding    Plan:     Rx megace 40 mg #45 3 x 5 days then 2 x 5 days then 1 daily with 1 refill   Follow up prn GC/CHL sent

## 2013-06-03 NOTE — Patient Instructions (Signed)
Take megace Follow up prn

## 2013-06-04 ENCOUNTER — Telehealth: Payer: Self-pay | Admitting: Adult Health

## 2013-06-04 LAB — GC/CHLAMYDIA PROBE AMP
CT PROBE, AMP APTIMA: NEGATIVE
GC Probe RNA: NEGATIVE

## 2013-06-04 NOTE — Telephone Encounter (Signed)
Pt aware test negative 

## 2013-07-09 ENCOUNTER — Emergency Department (HOSPITAL_COMMUNITY)
Admission: EM | Admit: 2013-07-09 | Discharge: 2013-07-09 | Disposition: A | Discharge status: Home / Self Care | Payer: Medicaid Other | Attending: Emergency Medicine | Admitting: Emergency Medicine

## 2013-07-09 ENCOUNTER — Emergency Department (HOSPITAL_COMMUNITY): Payer: Medicaid Other

## 2013-07-09 ENCOUNTER — Encounter (HOSPITAL_COMMUNITY): Payer: Self-pay | Admitting: Emergency Medicine

## 2013-07-09 DIAGNOSIS — N760 Acute vaginitis: Secondary | ICD-10-CM | POA: Insufficient documentation

## 2013-07-09 DIAGNOSIS — I880 Nonspecific mesenteric lymphadenitis: Secondary | ICD-10-CM | POA: Insufficient documentation

## 2013-07-09 DIAGNOSIS — Z8709 Personal history of other diseases of the respiratory system: Secondary | ICD-10-CM | POA: Insufficient documentation

## 2013-07-09 DIAGNOSIS — R51 Headache: Secondary | ICD-10-CM | POA: Insufficient documentation

## 2013-07-09 DIAGNOSIS — Z8659 Personal history of other mental and behavioral disorders: Secondary | ICD-10-CM | POA: Insufficient documentation

## 2013-07-09 DIAGNOSIS — A499 Bacterial infection, unspecified: Secondary | ICD-10-CM | POA: Insufficient documentation

## 2013-07-09 DIAGNOSIS — Z8619 Personal history of other infectious and parasitic diseases: Secondary | ICD-10-CM | POA: Insufficient documentation

## 2013-07-09 DIAGNOSIS — Z3202 Encounter for pregnancy test, result negative: Secondary | ICD-10-CM | POA: Insufficient documentation

## 2013-07-09 DIAGNOSIS — Z8739 Personal history of other diseases of the musculoskeletal system and connective tissue: Secondary | ICD-10-CM | POA: Insufficient documentation

## 2013-07-09 DIAGNOSIS — B9689 Other specified bacterial agents as the cause of diseases classified elsewhere: Secondary | ICD-10-CM | POA: Insufficient documentation

## 2013-07-09 LAB — CBC WITH DIFFERENTIAL/PLATELET
Basophils Absolute: 0 10*3/uL (ref 0.0–0.1)
Basophils Relative: 0 % (ref 0–1)
EOS ABS: 0.1 10*3/uL (ref 0.0–0.7)
Eosinophils Relative: 2 % (ref 0–5)
HCT: 40.4 % (ref 36.0–46.0)
HEMOGLOBIN: 14.1 g/dL (ref 12.0–15.0)
LYMPHS PCT: 34 % (ref 12–46)
Lymphs Abs: 2.3 10*3/uL (ref 0.7–4.0)
MCH: 30.5 pg (ref 26.0–34.0)
MCHC: 34.9 g/dL (ref 30.0–36.0)
MCV: 87.4 fL (ref 78.0–100.0)
MONO ABS: 0.5 10*3/uL (ref 0.1–1.0)
Monocytes Relative: 7 % (ref 3–12)
NEUTROS PCT: 57 % (ref 43–77)
Neutro Abs: 4 10*3/uL (ref 1.7–7.7)
Platelets: 185 10*3/uL (ref 150–400)
RBC: 4.62 MIL/uL (ref 3.87–5.11)
RDW: 12.8 % (ref 11.5–15.5)
WBC: 6.9 10*3/uL (ref 4.0–10.5)

## 2013-07-09 LAB — COMPREHENSIVE METABOLIC PANEL
ALBUMIN: 4.4 g/dL (ref 3.5–5.2)
ALT: 9 U/L (ref 0–35)
AST: 16 U/L (ref 0–37)
Alkaline Phosphatase: 57 U/L (ref 39–117)
BUN: 7 mg/dL (ref 6–23)
CO2: 25 mEq/L (ref 19–32)
CREATININE: 0.69 mg/dL (ref 0.50–1.10)
Calcium: 9.6 mg/dL (ref 8.4–10.5)
Chloride: 105 mEq/L (ref 96–112)
GFR calc Af Amer: >90 mL/min (ref >90)
GFR calc non Af Amer: >90 mL/min (ref >90)
Glucose, Bld: 75 mg/dL (ref 70–99)
Potassium: 3.9 mEq/L (ref 3.7–5.3)
Sodium: 142 mEq/L (ref 137–147)
TOTAL PROTEIN: 7.4 g/dL (ref 6.0–8.3)
Total Bilirubin: 0.3 mg/dL (ref 0.3–1.2)

## 2013-07-09 LAB — URINALYSIS, ROUTINE W REFLEX MICROSCOPIC
Bilirubin Urine: NEGATIVE
Glucose, UA: NEGATIVE mg/dL
Hgb urine dipstick: NEGATIVE
KETONES UR: NEGATIVE mg/dL
LEUKOCYTES UA: NEGATIVE
NITRITE: NEGATIVE
PROTEIN: NEGATIVE mg/dL
Specific Gravity, Urine: 1.02 (ref 1.005–1.030)
UROBILINOGEN UA: 2 mg/dL — AB (ref 0.0–1.0)
pH: 7.5 (ref 5.0–8.0)

## 2013-07-09 LAB — WET PREP, GENITAL
TRICH WET PREP: NONE SEEN
YEAST WET PREP: NONE SEEN

## 2013-07-09 LAB — LIPASE, BLOOD: Lipase: 39 U/L (ref 11–59)

## 2013-07-09 LAB — PREGNANCY, URINE: Preg Test, Ur: NEGATIVE

## 2013-07-09 MED ORDER — FENTANYL CITRATE 0.05 MG/ML IJ SOLN
50.0000 ug | Freq: Once | INTRAMUSCULAR | Status: AC
  Administered 2013-07-09: 50 ug via INTRAVENOUS

## 2013-07-09 MED ORDER — TRAMADOL HCL 50 MG PO TABS: 100.0000 mg | ORAL_TABLET | Freq: Four times a day (QID) | ORAL | Status: DC | PRN

## 2013-07-09 MED ORDER — SODIUM CHLORIDE 0.9 % IV SOLN
INTRAVENOUS | Status: DC
  Administered 2013-07-09: 20:00:00 via INTRAVENOUS

## 2013-07-09 MED ORDER — FAMOTIDINE 20 MG PO TABS
20.0000 mg | ORAL_TABLET | Freq: Once | ORAL | Status: AC
  Administered 2013-07-09: 20 mg via ORAL

## 2013-07-09 MED ORDER — IOHEXOL 300 MG/ML  SOLN
50.0000 mL | Freq: Once | INTRAMUSCULAR | Status: AC | PRN
  Administered 2013-07-09: 50 mL via ORAL

## 2013-07-09 MED ORDER — IOHEXOL 300 MG/ML  SOLN
100.0000 mL | Freq: Once | INTRAMUSCULAR | Status: AC | PRN
  Administered 2013-07-09: 100 mL via INTRAVENOUS

## 2013-07-09 MED ORDER — ONDANSETRON HCL 4 MG PO TABS: 4.0000 mg | ORAL_TABLET | Freq: Three times a day (TID) | ORAL | Status: DC | PRN

## 2013-07-09 MED ORDER — GI COCKTAIL ~~LOC~~
30.0000 mL | Freq: Once | ORAL | Status: AC
  Administered 2013-07-09: 30 mL via ORAL

## 2013-07-09 MED ORDER — METRONIDAZOLE 500 MG PO TABS: 500.0000 mg | ORAL_TABLET | Freq: Two times a day (BID) | ORAL | Status: DC

## 2013-07-09 MED ORDER — NAPROXEN 500 MG PO TABS: 500.0000 mg | ORAL_TABLET | Freq: Two times a day (BID) | ORAL | Status: DC

## 2013-07-09 MED ORDER — ONDANSETRON HCL 4 MG/2ML IJ SOLN
4.0000 mg | Freq: Once | INTRAMUSCULAR | Status: AC
  Administered 2013-07-09: 4 mg via INTRAVENOUS

## 2013-07-09 NOTE — Discharge Instructions (Signed)
Drink plenty of fluids. Take the naproxen and ultram with acetaminophen 650 mg every 6 hrs for pain. Take the antibiotics for the minor vaginal infection that you have. You also have swollen lymph nodes in your abdomen that are causing your abdominal pain. This is seen with a viral infection, and will go away over the next couple of weeks. You will be called if you have an STD.  Return to the ED if you get a fever, have uncontrolled vomiting or worsening pain.    Mesenteric Adenitis Mesenteric adenitis is an inflammation of lymph nodes (glands) in the abdomen. It may appear to mimic appendicitis symptoms. It is most common in children. The cause of this may be an infection somewhere else in the body. It usually gets well without treatment but can cause problems for up to a couple weeks. SYMPTOMS  The most common problems are:  Fever.  Abdominal pain and tenderness.  Nausea, vomiting, and/or diarrhea. DIAGNOSIS  Your caregiver may have an idea what is wrong by examining you or your child. Sometimes lab work and other studies such as Ultrasonography and a CT scan of the abdomen are done.  TREATMENT  Children with mesenteric adenitis will get well without further treatment. Treatment includes rest, pain medications, and fluids. HOME CARE INSTRUCTIONS   Do not take or give laxatives unless ordered by your caregiver.  Use pain medications as directed.  Follow the diet recommended by your caregiver. SEEK IMMEDIATE MEDICAL CARE IF:   The pain does not go away or becomes severe.  An oral temperature above 102 F (38.9 C) develops.  Repeated vomiting occurs.  The pain becomes localized in the right lower quadrant of the abdomen (possibly appendicitis).  You or your child notice bright red or black tarry stools. MAKE SURE YOU:   Understand these instructions.  Will watch your condition.  Will get help right away if you are not doing well or get worse. Document Released: 10/07/2005  Document Revised: 03/28/2011 Document Reviewed: 10/20/2005 [facility] Patient Information 2015 [place], [place]. This information is not intended to replace advice given to you by your health care provider. Make sure you discuss any questions you have with your health care provider.  Bacterial Vaginosis Bacterial vaginosis is a vaginal infection that occurs when the normal balance of bacteria in the vagina is disrupted. It results from an overgrowth of certain bacteria. This is the most common vaginal infection in women of childbearing age. Treatment is important to prevent complications, especially in pregnant women, as it can cause a premature delivery. CAUSES  Bacterial vaginosis is caused by an increase in harmful bacteria that are normally present in smaller amounts in the vagina. Several different kinds of bacteria can cause bacterial vaginosis. However, the reason that the condition develops is not fully understood. RISK FACTORS Certain activities or behaviors can put you at an increased risk of developing bacterial vaginosis, including:  Having a new sex partner or multiple sex partners.  Douching.  Using an intrauterine device (IUD) for contraception. Women do not get bacterial vaginosis from toilet seats, bedding, swimming pools, or contact with objects around them. SIGNS AND SYMPTOMS  Some women with bacterial vaginosis have no signs or symptoms. Common symptoms include:  Grey vaginal discharge.  A fishlike odor with discharge, especially after sexual intercourse.  Itching or burning of the vagina and vulva.  Burning or pain with urination. DIAGNOSIS  Your health care provider will take a medical history and examine the vagina for signs of  bacterial vaginosis. A sample of vaginal fluid may be taken. Your health care provider will look at this sample under a microscope to check for bacteria and abnormal cells. A vaginal pH test may also be done.  TREATMENT  Bacterial vaginosis may  be treated with antibiotic medicines. These may be given in the form of a pill or a vaginal cream. A second round of antibiotics may be prescribed if the condition comes back after treatment.  HOME CARE INSTRUCTIONS   Only take over-the-counter or prescription medicines as directed by your health care provider.  If antibiotic medicine was prescribed, take it as directed. Make sure you finish it even if you start to feel better.  Do not have sex until treatment is completed.  Tell all sexual partners that you have a vaginal infection. They should see their health care provider and be treated if they have problems, such as a mild rash or itching.  Practice safe sex by using condoms and only having one sex partner. SEEK MEDICAL CARE IF:   Your symptoms are not improving after 3 days of treatment.  You have increased discharge or pain.  You have a fever. MAKE SURE YOU:   Understand these instructions.  Will watch your condition.  Will get help right away if you are not doing well or get worse. FOR MORE INFORMATION  Centers for Disease Control and Prevention, Division of STD Prevention: [employer]raiser[employer]raud[employer] American Sexual Health Association (ASHA): www.ashastd.org  Document Released: 01/03/2005 Document Revised: 10/24/2012 Document Reviewed: 08/15/2012 [facility] Patient Information 2015 [place], [place]. This information is not intended to replace advice given to you by your health care provider. Make sure you discuss any questions you have with your health care provider.

## 2013-07-09 NOTE — ED Notes (Signed)
MD at bedside. 

## 2013-07-09 NOTE — ED Notes (Signed)
Pt c/o intermittent lower abdominal pain x2-3 weeks which she states has worsened over past 2 days. Pt reports nausea but v/d. Pt states "when I eat the pain gets worse". Pt denies vaginal discharge or changes in urinary symptoms.

## 2013-07-09 NOTE — ED Provider Notes (Signed)
CSN: [id number]     Arrival date & time 07/09/13  1723 History  This chart was scribed for Janice Norrie, MD by Girtha Hake, ED Scribe. The patient was seen in [place]. The patient's care was started at [facility].   Chief Complaint  Patient presents with  . Abdominal Pain    Patient is a 23 y.o. female presenting with abdominal pain. The history is provided by the patient. No language interpreter was used.  Abdominal Pain Pain location:  Suprapubic, epigastric and periumbilical (Worst in the suprapubic region) Pain quality: sharp   Pain severity:  Moderate Onset quality:  Sudden Duration:  3 weeks Timing:  Intermittent Progression:  Unchanged Chronicity:  New Ineffective treatments:  None tried Associated symptoms: no chills, no dysuria, no fever, no nausea, no vaginal discharge and no vomiting   Risk factors: not elderly and not obese    HPI Comments: Deanna Hughes is a 23 y.o. female with a history of right inguinal hernea who presents to the Emergency Department complaining of sharp, intermittent suprapubic abdominal pain beginning three weeks ago. Patient reports that episodes of pain last for a couple minutes at a time. She reports that for the past three weeks, she has developed abdominal pain every time she eats or drinks anything. She reports that the pain sometimes also appears when she is not eating or drinking anything. She also reports a spell of dizziness earlier today which lasted approximately 20 seconds. Patient states that she has not experienced any similar dizzy spells before. She also reports multiple episodes of diarrhea three  weeks ago, but states that this has since resolved. Patient denies, nausea, vomiting, dysuria, vaginal discharge, hematochezia, black, tarry stools. Patient denies a history of smoking.   Patient also complains of severe, "aching" intermittent frontal headaches beginning one week ago. Patient reports that the headaches last for  approximately five minutes at a time. She reports that she has taken OTC ibuprofen for the past two days with  relief. LNMP began Tuesday, 06/18/13. Pt had a nexplanon inserted about 6 weeks ago. She reports her right inguinal hernia it is not getting larger and is currently not out or painful.  She does not have a PCP.   [facility]  Past Medical History  Diagnosis Date  . Right sided abdominal pain   . Scoliosis   . No pertinent past medical history   . Chlamydia     treated 6/6  . Depression   . Yeast infection 07/03/2012  . Seasonal allergies 07/03/2012  . Vaginal discharge 10/03/2012  . Contraceptive management 01/25/2013  . Pregnant   . Threatened abortion in early pregnancy 03/20/2013  . Miscarriage 03/27/2013  . Nexplanon insertion 04/11/2013    nexplanon inserted 04/11/13 remove 3/32/18  . Irregular bleeding 06/03/2013   History reviewed. No pertinent past surgical history. Family History  Problem Relation Age of Onset  . Asthma Mother   . Asthma Sister   . Asthma Brother   . Asthma Daughter   . Diabetes Maternal Grandmother   . Hypertension Maternal Grandmother   . Asthma Maternal Grandmother   . Heart disease Maternal Grandmother   . Stroke Maternal Grandmother   . Hypertension Maternal Grandfather   . Asthma Maternal Grandfather   . Heart disease Maternal Grandfather   . Asthma Son    History  Substance Use Topics  . Smoking status: Never Smoker   . Smokeless tobacco: Never Used  . Alcohol Use: No  employed  OB History   Grav Para Term Preterm Abortions TAB SAB Ect Mult Living   4 2 2  0 2 0 2 0 0 2     Review of Systems  Constitutional: Negative for fever and chills.  Gastrointestinal: Positive for abdominal pain. Negative for nausea, vomiting and blood in stool.  Genitourinary: Negative for dysuria and vaginal discharge.  Neurological: Positive for headaches.  All other systems reviewed and are negative.     Allergies  Review of patient's  allergies indicates no known allergies.  Home Medications   Prior to Admission medications   Medication Sig Start Date End Date Taking? Authorizing Provider  etonogestrel (NEXPLANON) 68 MG IMPL implant Inject 1 each into the skin once.    Historical Provider, MD   Triage Vitals: BP 112/62  Pulse 69  Temp(Src) 98.5 F (36.9 C) (Oral)  Resp 18  Ht 5\' 2"  (1.575 m)  Wt 130 lb (58.968 kg)  BMI 23.77 kg/m2  SpO2 100%  LMP 06/19/2013  Vital signs normal   Physical Exam  Nursing note and vitals reviewed. Constitutional: She is oriented to person, place, and time. She appears well-developed and well-nourished.  Non-toxic appearance. She does not appear ill. No distress.  HENT:  Head: Normocephalic and atraumatic.  Right Ear: External ear normal.  Left Ear: External ear normal.  Nose: Nose normal. No mucosal edema or rhinorrhea.  Mouth/Throat: Oropharynx is clear and moist and mucous membranes are normal. No dental abscesses or uvula swelling.  Tongue is dry.   Eyes: Conjunctivae and EOM are normal. Pupils are equal, round, and reactive to light.  Neck: Normal range of motion and full passive range of motion without pain. Neck supple.  Cardiovascular: Normal rate, regular rhythm and normal heart sounds.  Exam reveals no gallop and no friction rub.   No murmur heard. Pulmonary/Chest: Effort normal and breath sounds normal. No respiratory distress. She has no wheezes. She has no rhonchi. She has no rales. She exhibits no tenderness and no crepitus.  Abdominal: Soft. Normal appearance and bowel sounds are normal. She exhibits no distension. There is tenderness (Mild epigastric, and periumbilical; moderate suprapubic tenderness ). There is no rebound and no guarding.    Genitourinary:  Normal external genitalia. Small amount of blood and white discharge in the vault. Mildly tender. Not enlarged uterus. Mildly tender bilateral ovaries without enlargement.   Musculoskeletal: Normal range of  motion. She exhibits no edema and no tenderness.  Moves all extremities well.   No CVA tenderness.   Neurological: She is alert and oriented to person, place, and time. She has normal strength. No cranial nerve deficit.  Skin: Skin is warm, dry and intact. No rash noted. No erythema. No pallor.  Psychiatric: She has a normal mood and affect. Her speech is normal and behavior is normal. Her mood appears not anxious.    ED Course  Procedures (including critical care time) Medications  0.9 %  sodium chloride infusion ( Intravenous Stopped 07/09/13 2222)  gi cocktail (Maalox,Lidocaine,Donnatal) (30 mLs Oral Given 07/09/13 1828)  famotidine (PEPCID) tablet 20 mg (20 mg Oral Given 07/09/13 1828)  fentaNYL (SUBLIMAZE) injection 50 mcg (50 mcg Intravenous Given 07/09/13 1941)  ondansetron (ZOFRAN) injection 4 mg (4 mg Intravenous Given 07/09/13 1941)  iohexol (OMNIPAQUE) 300 MG/ML solution 50 mL (50 mLs Oral Contrast Given 07/09/13 1929)  iohexol (OMNIPAQUE) 300 MG/ML solution 100 mL (100 mLs Intravenous Contrast Given 07/09/13 2038)    DIAGNOSTIC STUDIES: Oxygen Saturation is 100% on  room air, normal by my interpretation.    COORDINATION OF CARE: 6:19 PM-Discussed treatment plan which includes GI cocktail, Pepcid 20 mg, and labs with pt at bedside and pt agreed to plan.   7:14 PM - Informed patient of results of urinalysis. Discussed plans for further treatment which include labs and a pelvic CT.   8:01 PM - Performed pelvic exam.   Patient was given the results of her laboratory tests and her CT scan. She was advised she had mesenteric adenitis and she would have discomfort for another week or 2, however since it has been present for about 3 weeks it should be gone soon.    Results for orders placed during the hospital encounter of 07/09/13  WET PREP, GENITAL      Result Value Ref Range   Yeast Wet Prep HPF POC NONE SEEN  NONE SEEN   Trich, Wet Prep NONE SEEN  NONE SEEN   Clue Cells Wet  Prep HPF POC MODERATE (*) NONE SEEN   WBC, Wet Prep HPF POC FEW (*) NONE SEEN  URINALYSIS, ROUTINE W REFLEX MICROSCOPIC      Result Value Ref Range   Color, Urine YELLOW  YELLOW   APPearance CLEAR  CLEAR   Specific Gravity, Urine 1.020  1.005 - 1.030   pH 7.5  5.0 - 8.0   Glucose, UA NEGATIVE  NEGATIVE mg/dL   Hgb urine dipstick NEGATIVE  NEGATIVE   Bilirubin Urine NEGATIVE  NEGATIVE   Ketones, ur NEGATIVE  NEGATIVE mg/dL   Protein, ur NEGATIVE  NEGATIVE mg/dL   Urobilinogen, UA 2.0 (*) 0.0 - 1.0 mg/dL   Nitrite NEGATIVE  NEGATIVE   Leukocytes, UA NEGATIVE  NEGATIVE  PREGNANCY, URINE      Result Value Ref Range   Preg Test, Ur NEGATIVE  NEGATIVE  CBC WITH DIFFERENTIAL      Result Value Ref Range   WBC 6.9  4.0 - 10.5 K/uL   RBC 4.62  3.87 - 5.11 MIL/uL   Hemoglobin 14.1  12.0 - 15.0 g/dL   HCT 40.4  36.0 - 46.0 %   MCV 87.4  78.0 - 100.0 fL   MCH 30.5  26.0 - 34.0 pg   MCHC 34.9  30.0 - 36.0 g/dL   RDW 12.8  11.5 - 15.5 %   Platelets 185  150 - 400 K/uL   Neutrophils Relative % 57  43 - 77 %   Lymphocytes Relative 34  12 - 46 %   Monocytes Relative 7  3 - 12 %   Eosinophils Relative 2  0 - 5 %   Basophils Relative 0  0 - 1 %   Neutro Abs 4.0  1.7 - 7.7 K/uL   Lymphs Abs 2.3  0.7 - 4.0 K/uL   Monocytes Absolute 0.5  0.1 - 1.0 K/uL   Eosinophils Absolute 0.1  0.0 - 0.7 K/uL   Basophils Absolute 0.0  0.0 - 0.1 K/uL   RBC Morphology STOMATOCYTES     WBC Morphology ATYPICAL LYMPHOCYTES     Smear Review PLATELET COUNT CONFIRMED BY SMEAR    COMPREHENSIVE METABOLIC PANEL      Result Value Ref Range   Sodium 142  137 - 147 mEq/L   Potassium 3.9  3.7 - 5.3 mEq/L   Chloride 105  96 - 112 mEq/L   CO2 25  19 - 32 mEq/L   Glucose, Bld 75  70 - 99 mg/dL   BUN 7  6 - 23  mg/dL   Creatinine, Ser 0.69  0.50 - 1.10 mg/dL   Calcium 9.6  8.4 - 10.5 mg/dL   Total Protein 7.4  6.0 - 8.3 g/dL   Albumin 4.4  3.5 - 5.2 g/dL   AST 16  0 - 37 U/L   ALT 9  0 - 35 U/L   Alkaline Phosphatase  57  39 - 117 U/L   Total Bilirubin 0.3  0.3 - 1.2 mg/dL   GFR calc non Af Amer >90  >90 mL/min   GFR calc Af Amer >90  >90 mL/min  LIPASE, BLOOD      Result Value Ref Range   Lipase 39  11 - 59 U/L   Laboratory interpretation all normal except bacterial vaginosis    Imaging Review Ct Abdomen Pelvis W Contrast  07/09/2013   CLINICAL DATA:  Lower abdominal pain for 3 weeks. Recent diarrhea has resolved.  EXAM: CT ABDOMEN AND PELVIS WITH CONTRAST  TECHNIQUE: Multidetector CT imaging of the abdomen and pelvis was performed using the standard protocol following bolus administration of intravenous contrast.  CONTRAST:  171mL OMNIPAQUE IOHEXOL 300 MG/ML  SOLN  COMPARISON:  Multiple exams, including 03/23/2010  FINDINGS: The liver, spleen, pancreas, and adrenal glands appear unremarkable. No specific gallbladder or biliary abnormality identified.  Kidneys and proximal ureters unremarkable. No pathologic upper abdominal adenopathy is observed. Appendix within normal limits.  Uterine and adnexal contours unremarkable. No significant bowel abnormality is identified. There is increased number of small central mesenteric lymph nodes.  IMPRESSION: 1. Prominence of small mesenteric lymph nodes, likely reactive from an otherwise occult enteritis or mesenteric adenitis. Currently no bowel wall thickening is observed.   Electronically Signed   By: Sherryl Barters M.D.   On: 07/09/2013 20:54     EKG Interpretation None      MDM   Final diagnoses:  BV (bacterial vaginosis)  Mesenteric adenitis    Discharge Medication List as of 07/09/2013  9:58 PM    START taking these medications   Details  metroNIDAZOLE (FLAGYL) 500 MG tablet Take 1 tablet (500 mg total) by mouth 2 (two) times daily., Starting 07/09/2013, Until Discontinued, Print    naproxen (NAPROSYN) 500 MG tablet Take 1 tablet (500 mg total) by mouth 2 (two) times daily with a meal., Starting 07/09/2013, Until Discontinued, Print    ondansetron  (ZOFRAN) 4 MG tablet Take 1 tablet (4 mg total) by mouth every 8 (eight) hours as needed for nausea or vomiting., Starting 07/09/2013, Until Discontinued, Print    traMADol (ULTRAM) 50 MG tablet Take 2 tablets (100 mg total) by mouth every 6 (six) hours as needed., Starting 07/09/2013, Until Discontinued, Print        Plan discharge   Rolland Porter, MD, FACEP   I personally performed the services described in this documentation, which was scribed in my presence. The recorded information has been reviewed and considered.  Rolland Porter, MD, [facility] Sander   Janice Norrie, MD 07/10/13 [phone]

## 2013-07-10 LAB — HIV ANTIBODY (ROUTINE TESTING W REFLEX): HIV 1&2 Ab, 4th Generation: NONREACTIVE

## 2013-07-10 LAB — SYPHILIS: RPR W/REFLEX TO RPR TITER AND TREPONEMAL ANTIBODIES, TRADITIONAL SCREENING AND DIAGNOSIS ALGORITHM

## 2013-07-11 LAB — GC/CHLAMYDIA PROBE AMP
CT Probe RNA: NEGATIVE
GC Probe RNA: NEGATIVE

## 2013-08-22 ENCOUNTER — Ambulatory Visit: Payer: Medicaid Other | Admitting: Adult Health

## 2013-08-27 ENCOUNTER — Ambulatory Visit (INDEPENDENT_AMBULATORY_CARE_PROVIDER_SITE_OTHER): Payer: Medicaid Other | Admitting: Adult Health

## 2013-08-27 ENCOUNTER — Encounter: Payer: Self-pay | Admitting: Adult Health

## 2013-08-27 VITALS — BP 90/60 | Temp 98.6°F | Ht 62.0 in | Wt 137.5 lb

## 2013-08-27 DIAGNOSIS — J329 Chronic sinusitis, unspecified: Secondary | ICD-10-CM

## 2013-08-27 DIAGNOSIS — J321 Chronic frontal sinusitis: Secondary | ICD-10-CM

## 2013-08-27 DIAGNOSIS — N898 Other specified noninflammatory disorders of vagina: Secondary | ICD-10-CM

## 2013-08-27 HISTORY — DX: Chronic sinusitis, unspecified: J32.9

## 2013-08-27 LAB — POCT WET PREP (WET MOUNT)

## 2013-08-27 MED ORDER — FLUCONAZOLE 150 MG PO TABS: 150.0000 mg | ORAL_TABLET | Freq: Once | ORAL | Status: DC

## 2013-08-27 MED ORDER — AZITHROMYCIN 250 MG PO TABS: ORAL_TABLET | ORAL | Status: DC

## 2013-08-27 NOTE — Patient Instructions (Signed)

## 2013-08-27 NOTE — Progress Notes (Signed)
Subjective:     Patient ID: [name], female   DOB: 1990-02-11, 23 y.o.   MRN: [id number]  HPI Danee is a 23 year old black female in complaining of vaginal discharge, denies itching or burning, no new sex partners, and has headache and throat was sore.  Review of Systems See HPI Reviewed past medical,surgical, social and family history. Reviewed medications and allergies.     Objective:   Physical Exam BP 90/60  Temp(Src) 98.6 F (37 C)  Ht 5\' 2"  (1.575 m)  Wt 137 lb 8 oz (62.37 kg)  BMI 25.14 kg/m2  LMP 07/23/2013 Skin warm and dry.Pelvic: external genitalia is normal in appearance, vagina: pink discharge without odor, cervix:smooth and bulbous, uterus: normal size, shape and contour, non tender, no masses felt, adnexa: no masses or tenderness noted. Wet prep: + few WBCs.   Has tenderness frontal sinuses and some in maxillary,ears has some redness on canal, but TM gray, throat red, no swelling or pustules noted. Has had some hoarseness.   Assessment:     Vaginal discharge Sinus infection    Plan:     Rx Azithromycin 250 mg #6 take 2 now and 1 daily x 4 days Rx diflucan 150 mg #1 1 now with 1 refills Push fluids, try zyrtec/claritin OTC, try not to get hot Follow up prn

## 2013-09-11 ENCOUNTER — Encounter (HOSPITAL_COMMUNITY): Payer: Self-pay | Admitting: Emergency Medicine

## 2013-09-11 ENCOUNTER — Emergency Department (HOSPITAL_COMMUNITY)
Admission: EM | Admit: 2013-09-11 | Discharge: 2013-09-11 | Disposition: A | Discharge status: Home / Self Care | Payer: Medicaid Other | Attending: Emergency Medicine | Admitting: Emergency Medicine

## 2013-09-11 DIAGNOSIS — Z8739 Personal history of other diseases of the musculoskeletal system and connective tissue: Secondary | ICD-10-CM | POA: Diagnosis not present

## 2013-09-11 DIAGNOSIS — F3289 Other specified depressive episodes: Secondary | ICD-10-CM | POA: Insufficient documentation

## 2013-09-11 DIAGNOSIS — F329 Major depressive disorder, single episode, unspecified: Secondary | ICD-10-CM | POA: Insufficient documentation

## 2013-09-11 DIAGNOSIS — Z8709 Personal history of other diseases of the respiratory system: Secondary | ICD-10-CM | POA: Diagnosis not present

## 2013-09-11 DIAGNOSIS — M546 Pain in thoracic spine: Secondary | ICD-10-CM | POA: Diagnosis not present

## 2013-09-11 DIAGNOSIS — M545 Low back pain, unspecified: Secondary | ICD-10-CM

## 2013-09-11 DIAGNOSIS — Z8619 Personal history of other infectious and parasitic diseases: Secondary | ICD-10-CM | POA: Diagnosis not present

## 2013-09-11 DIAGNOSIS — M549 Dorsalgia, unspecified: Secondary | ICD-10-CM | POA: Insufficient documentation

## 2013-09-11 DIAGNOSIS — Z8742 Personal history of other diseases of the female genital tract: Secondary | ICD-10-CM | POA: Insufficient documentation

## 2013-09-11 MED ORDER — TRAMADOL HCL 50 MG PO TABS: 50.0000 mg | ORAL_TABLET | Freq: Four times a day (QID) | ORAL | Status: DC | PRN

## 2013-09-11 MED ORDER — METHOCARBAMOL 500 MG PO TABS: 500.0000 mg | ORAL_TABLET | Freq: Three times a day (TID) | ORAL | Status: DC | PRN

## 2013-09-11 MED ORDER — NAPROXEN 500 MG PO TABS: 500.0000 mg | ORAL_TABLET | Freq: Two times a day (BID) | ORAL | Status: DC

## 2013-09-11 NOTE — ED Provider Notes (Signed)
CSN: [id number]     Arrival date & time 09/11/13  1853 History  This chart was scribed for Tanna Furry, MD by Randa Evens, ED Scribe. This patient was seen in room [facility]08/[facility]08 and the patient's care was started at 8:08 PM.    Chief Complaint  Patient presents with  . Back Pain   The history is provided by the patient. No language interpreter was used.   HPI Comments: Deanna Hughes is a 23 y.o. female who presents to the Emergency Department complaining of constant right sided back pain onset 3 days prior. She states that the pain does not radiate. She states that her pain worsens when bending over and lifting heavy objects. She states she has a Hx of Scoliosis.   Past Medical History  Diagnosis Date  . Right sided abdominal pain   . Scoliosis   . No pertinent past medical history   . Chlamydia     treated 6/6  . Depression   . Yeast infection 07/03/2012  . Seasonal allergies 07/03/2012  . Vaginal discharge 10/03/2012  . Contraceptive management 01/25/2013  . Pregnant   . Threatened abortion in early pregnancy 03/20/2013  . Miscarriage 03/27/2013  . Nexplanon insertion 04/11/2013    nexplanon inserted 04/11/13 remove 3/32/18  . Irregular bleeding 06/03/2013  . Sinus infection 08/27/2013   History reviewed. No pertinent past surgical history. Family History  Problem Relation Age of Onset  . Asthma Mother   . Asthma Sister   . Asthma Brother   . Asthma Daughter   . Diabetes Maternal Grandmother   . Hypertension Maternal Grandmother   . Asthma Maternal Grandmother   . Heart disease Maternal Grandmother   . Stroke Maternal Grandmother   . Hypertension Maternal Grandfather   . Asthma Maternal Grandfather   . Heart disease Maternal Grandfather   . Asthma Son    History  Substance Use Topics  . Smoking status: Never Smoker   . Smokeless tobacco: Never Used  . Alcohol Use: No   OB History   Grav Para Term Preterm Abortions TAB SAB Ect Mult Living   4 2 2  0 2 0 2 0 0 2      Review of Systems  Constitutional: Negative for fever, chills, diaphoresis, appetite change and fatigue.  HENT: Negative for mouth sores, sore throat and trouble swallowing.   Eyes: Negative for visual disturbance.  Respiratory: Negative for cough, chest tightness, shortness of breath and wheezing.   Cardiovascular: Negative for chest pain.  Gastrointestinal: Negative for nausea, vomiting, abdominal pain, diarrhea and abdominal distention.  Endocrine: Negative for polydipsia, polyphagia and polyuria.  Genitourinary: Negative for dysuria, frequency and hematuria.  Musculoskeletal: Positive for back pain. Negative for gait problem.  Skin: Negative for color change, pallor and rash.  Neurological: Negative for dizziness, syncope, light-headedness and headaches.  Hematological: Does not bruise/bleed easily.  Psychiatric/Behavioral: Negative for behavioral problems and confusion.    Allergies  Review of patient's allergies indicates no known allergies.  Home Medications   Prior to Admission medications   Medication Sig Start Date End Date Taking? Authorizing Provider  etonogestrel (NEXPLANON) 68 MG IMPL implant Inject 1 each into the skin once.    Historical Provider, MD  methocarbamol (ROBAXIN) 500 MG tablet Take 1 tablet (500 mg total) by mouth 3 (three) times daily between meals as needed. 09/11/13   Tanna Furry, MD  naproxen (NAPROSYN) 500 MG tablet Take 1 tablet (500 mg total) by mouth 2 (two) times daily. 09/11/13  Tanna Furry, MD  traMADol (ULTRAM) 50 MG tablet Take 1 tablet (50 mg total) by mouth every 6 (six) hours as needed. 09/11/13   Tanna Furry, MD   Triage Vitals: BP 123/64  Pulse 86  Temp(Src) 98.7 F (37.1 C) (Oral)  Resp 18  Ht 5\' 2"  (1.575 m)  Wt 135 lb (61.236 kg)  BMI 24.69 kg/m2  SpO2 97%  LMP 09/08/2013  Physical Exam  Constitutional: She is oriented to person, place, and time. She appears well-developed and well-nourished. No distress.  HENT:  Head:  Normocephalic.  Eyes: Conjunctivae are normal. Pupils are equal, round, and reactive to light. No scleral icterus.  Neck: Normal range of motion. Neck supple. No thyromegaly present.  Cardiovascular: Normal rate and regular rhythm.  Exam reveals no gallop and no friction rub.   No murmur heard. Pulmonary/Chest: Effort normal and breath sounds normal. No respiratory distress. She has no wheezes. She has no rales.  Abdominal: Soft. Bowel sounds are normal. She exhibits no distension. There is no tenderness. There is no rebound.  Musculoskeletal: Normal range of motion. She exhibits tenderness.   Right paraspinal  tenderness in thoracic and lumbar region  Neurological: She is alert and oriented to person, place, and time.  Skin: Skin is warm and dry. No rash noted.  Psychiatric: She has a normal mood and affect. Her behavior is normal.    ED Course  Procedures (including critical care time) DIAGNOSTIC STUDIES: Oxygen Saturation is 97% on RA, normal by my interpretation.    COORDINATION OF CARE: 8:13 PM-Discussed treatment plan which includes light duty for 2 days with pt at bedside and pt agreed to plan.     Labs Review Labs Reviewed - No data to display  Imaging Review No results found.   EKG Interpretation None      MDM   Final diagnoses:  Right-sided low back pain without sciatica            Tanna Furry, MD 09/11/13 2101

## 2013-09-11 NOTE — ED Notes (Signed)
PT c/o right sided back pain worsening x3 days. PT denies any injury but has a hx of scoliosis. PT ambulatory and carrying son into triage with no difficulty.

## 2013-09-11 NOTE — Discharge Instructions (Signed)
Back Pain, Adult °Back pain is very common. The pain often gets better over time. The cause of back pain is usually not dangerous. Most people can learn to manage their back pain on their own.  °HOME CARE  °· Stay active. Start with short walks on flat ground if you can. Try to walk farther each day. °· Do not sit, drive, or stand in one place for more than 30 minutes. Do not stay in bed. °· Do not avoid exercise or work. Activity can help your back heal faster. °· Be careful when you bend or lift an object. Bend at your knees, keep the object close to you, and do not twist. °· Sleep on a firm mattress. Lie on your side, and bend your knees. If you lie on your back, put a pillow under your knees. °· Only take medicines as told by your doctor. °· Put ice on the injured area. °¨ Put ice in a plastic bag. °¨ Place a towel between your skin and the bag. °¨ Leave the ice on for 15-20 minutes, 03-04 times a day for the first 2 to 3 days. After that, you can switch between ice and heat packs. °· Ask your doctor about back exercises or massage. °· Avoid feeling anxious or stressed. Find good ways to deal with stress, such as exercise. °GET HELP RIGHT AWAY IF:  °· Your pain does not go away with rest or medicine. °· Your pain does not go away in 1 week. °· You have new problems. °· You do not feel well. °· The pain spreads into your legs. °· You cannot control when you poop (bowel movement) or pee (urinate). °· Your arms or legs feel weak or lose feeling (numbness). °· You feel sick to your stomach (nauseous) or throw up (vomit). °· You have belly (abdominal) pain. °· You feel like you may pass out (faint). °MAKE SURE YOU:  °· Understand these instructions. °· Will watch your condition. °· Will get help right away if you are not doing well or get worse. °Document Released: 06/22/2007 Document Revised: 03/28/2011 Document Reviewed: 05/07/2013 °[employer]® Patient Information ©2015 [employer]. This information is not intended  to replace advice given to you by your health care provider. Make sure you discuss any questions you have with your health care provider. ° °

## 2013-09-27 ENCOUNTER — Emergency Department (HOSPITAL_COMMUNITY)
Admission: EM | Admit: 2013-09-27 | Discharge: 2013-09-27 | Disposition: A | Discharge status: Home / Self Care | Payer: Medicaid Other | Attending: Emergency Medicine | Admitting: Emergency Medicine

## 2013-09-27 ENCOUNTER — Encounter (HOSPITAL_COMMUNITY): Payer: Self-pay | Admitting: Emergency Medicine

## 2013-09-27 DIAGNOSIS — Z8709 Personal history of other diseases of the respiratory system: Secondary | ICD-10-CM | POA: Diagnosis not present

## 2013-09-27 DIAGNOSIS — R209 Unspecified disturbances of skin sensation: Secondary | ICD-10-CM | POA: Insufficient documentation

## 2013-09-27 DIAGNOSIS — F3289 Other specified depressive episodes: Secondary | ICD-10-CM | POA: Insufficient documentation

## 2013-09-27 DIAGNOSIS — M25531 Pain in right wrist: Secondary | ICD-10-CM

## 2013-09-27 DIAGNOSIS — R52 Pain, unspecified: Secondary | ICD-10-CM | POA: Insufficient documentation

## 2013-09-27 DIAGNOSIS — Z8742 Personal history of other diseases of the female genital tract: Secondary | ICD-10-CM | POA: Insufficient documentation

## 2013-09-27 DIAGNOSIS — F329 Major depressive disorder, single episode, unspecified: Secondary | ICD-10-CM | POA: Insufficient documentation

## 2013-09-27 DIAGNOSIS — Z8619 Personal history of other infectious and parasitic diseases: Secondary | ICD-10-CM | POA: Insufficient documentation

## 2013-09-27 DIAGNOSIS — M25539 Pain in unspecified wrist: Secondary | ICD-10-CM | POA: Diagnosis present

## 2013-09-27 MED ORDER — TRAMADOL HCL 50 MG PO TABS: 50.0000 mg | ORAL_TABLET | Freq: Four times a day (QID) | ORAL | Status: DC | PRN

## 2013-09-27 MED ORDER — NAPROXEN 500 MG PO TABS: 500.0000 mg | ORAL_TABLET | Freq: Two times a day (BID) | ORAL | Status: DC

## 2013-09-27 NOTE — ED Provider Notes (Signed)
CSN: [id number]     Arrival date & time 09/27/13  1537 History   First MD Initiated Contact with Patient 09/27/13 1630     Chief Complaint  Patient presents with  . Wrist Pain    rt     (Consider location/radiation/quality/duration/timing/severity/associated sxs/prior Treatment) HPI  Deanna Hughes is a 23 y.o. female who presents to the Emergency Department complaining of right wrist pain and intermittent swelling to the wrist for two days.  She reports having pain with movement of her wrist and swelling associated with tingling sensation to her fingers that occurs in the mornings at resolves dring the day.  She denies injury, but admits to frequent use picking up her small children and boxes.  She denies pain proximal to the wrist, neck pain, or persistent numbness.  She has tried tylenol and ibuprofen w/o relief.    . Past Medical History  Diagnosis Date  . Right sided abdominal pain   . Scoliosis   . No pertinent past medical history   . Chlamydia     treated 6/6  . Depression   . Yeast infection 07/03/2012  . Seasonal allergies 07/03/2012  . Vaginal discharge 10/03/2012  . Contraceptive management 01/25/2013  . Pregnant   . Threatened abortion in early pregnancy 03/20/2013  . Miscarriage 03/27/2013  . Nexplanon insertion 04/11/2013    nexplanon inserted 04/11/13 remove 3/32/18  . Irregular bleeding 06/03/2013  . Sinus infection 08/27/2013   History reviewed. No pertinent past surgical history. Family History  Problem Relation Age of Onset  . Asthma Mother   . Asthma Sister   . Asthma Brother   . Asthma Daughter   . Diabetes Maternal Grandmother   . Hypertension Maternal Grandmother   . Asthma Maternal Grandmother   . Heart disease Maternal Grandmother   . Stroke Maternal Grandmother   . Hypertension Maternal Grandfather   . Asthma Maternal Grandfather   . Heart disease Maternal Grandfather   . Asthma Son    History  Substance Use Topics  . Smoking status: Never  Smoker   . Smokeless tobacco: Never Used  . Alcohol Use: No   OB History   Grav Para Term Preterm Abortions TAB SAB Ect Mult Living   4 2 2  0 2 0 2 0 0 2     Review of Systems  Constitutional: Negative for fever and chills.  Musculoskeletal: Positive for arthralgias and joint swelling. Negative for neck pain.  Skin: Negative for color change and wound.  Neurological: Positive for numbness. Negative for dizziness, weakness, light-headedness and headaches.       Tingling sensation to her right fingers  All other systems reviewed and are negative.     Allergies  Review of patient's allergies indicates no known allergies.  Home Medications   Prior to Admission medications   Medication Sig Start Date End Date Taking? Authorizing Provider  etonogestrel (NEXPLANON) 68 MG IMPL implant Inject 1 each into the skin once.    Historical Provider, MD  methocarbamol (ROBAXIN) 500 MG tablet Take 1 tablet (500 mg total) by mouth 3 (three) times daily between meals as needed. 09/11/13   Tanna Furry, MD  naproxen (NAPROSYN) 500 MG tablet Take 1 tablet (500 mg total) by mouth 2 (two) times daily. 09/11/13   Tanna Furry, MD  traMADol (ULTRAM) 50 MG tablet Take 1 tablet (50 mg total) by mouth every 6 (six) hours as needed. 09/11/13   Tanna Furry, MD   BP 106/56  Pulse 83  Temp(Src) 98.6 F (37 C) (Oral)  Resp 17  Ht 5\' 2"  (1.575 m)  Wt 138 lb (62.596 kg)  BMI 25.23 kg/m2  SpO2 100%  LMP 09/08/2013 Physical Exam  Nursing note and vitals reviewed. Constitutional: She is oriented to person, place, and time. She appears well-developed and well-nourished. No distress.  HENT:  Head: Normocephalic and atraumatic.  Neck: Normal range of motion. Neck supple.  Cardiovascular: Normal rate, regular rhythm, normal heart sounds and intact distal pulses.   No murmur heard. Pulmonary/Chest: Effort normal and breath sounds normal. No respiratory distress.  Musculoskeletal: Normal range of motion. She exhibits  tenderness. She exhibits no edema.  ttp dorsal right wrist, small palpable flesh colored nodule.  Radial pulse is brisk, distal sensation intact.  CR< 2 sec.  No skin lesions, erythema, edema or bony deformity.  Patient has full ROM. No proximal tenderness  Neurological: She is alert and oriented to person, place, and time. She exhibits normal muscle tone. Coordination normal.  Skin: Skin is warm and dry.    ED Course  Procedures (including critical care time) Labs Review Labs Reviewed - No data to display  Imaging Review No results found.   EKG Interpretation None      MDM   Final diagnoses:  Wrist pain, acute, right   Pt with localized tenderness of right wrist, no concerning sx''s for septic joint or cellulitis.  NV intact.  Patient does have a very small, palpable nodule to the dorsal wrist that may be a ganglion cyst, although sx's appears c/w sprain.  She agrees to elevate, ice, naprosyn and ultram for pain.  Given velcro wrist splint and referral info to Dr. Aline Brochure.       Faust Thorington L. Vanessa [place], PA-C 09/27/13 1701

## 2013-09-27 NOTE — ED Notes (Signed)
Pt denies injury, rt wrist started hurting 3 days ago, pain on top of wrist, denies numbness, at night pt states wrist swells and tingles.

## 2013-09-27 NOTE — Discharge Instructions (Signed)
Wrist Pain °A wrist sprain happens when the bands of tissue that hold the wrist joints together (ligament) stretch too much or tear. A wrist strain happens when muscles or bands of tissue that connect muscles to bones (tendons) are stretched or pulled. °HOME CARE °· Put ice on the injured area. °¨ Put ice in a plastic bag. °¨ Place a towel between your skin and the bag. °¨ Leave the ice on for 15-20 minutes, 03-04 times a day, for the first 2 days. °· Raise (elevate) the injured wrist to lessen puffiness (swelling). °· Rest the injured wrist for at least 48 hours or as told by your doctor. °· Wear a splint, cast, or an elastic wrap as told by your doctor. °· Only take medicine as told by your doctor. °· Follow up with your doctor as told. This is important. °GET HELP RIGHT AWAY IF:  °· The fingers are puffy, very red, white, or cold and blue. °· The fingers lose feeling (numb) or tingle. °· The pain gets worse. °· It is hard to move the fingers. °MAKE SURE YOU:  °· Understand these instructions. °· Will watch your condition. °· Will get help right away if you are not doing well or get worse. °Document Released: 06/22/2007 Document Revised: 03/28/2011 Document Reviewed: 02/24/2010 °[employer]® Patient Information ©2015 [employer]. This information is not intended to replace advice given to you by your health care provider. Make sure you discuss any questions you have with your health care provider. ° °

## 2013-09-28 NOTE — ED Provider Notes (Addendum)
Medical screening examination/treatment/procedure(s) were performed by non-physician practitioner and as supervising physician I was immediately available for consultation/collaboration.   EKG Interpretation None         EKG Interpretation None        Maudry Diego, MD 09/28/13 0022  Maudry Diego, MD 10/07/13 [phone]

## 2013-10-30 ENCOUNTER — Ambulatory Visit: Payer: Medicaid Other | Admitting: Adult Health

## 2013-11-10 ENCOUNTER — Emergency Department (HOSPITAL_COMMUNITY)
Admission: EM | Admit: 2013-11-10 | Discharge: 2013-11-10 | Disposition: A | Discharge status: Home / Self Care | Payer: Medicaid Other | Attending: Emergency Medicine | Admitting: Emergency Medicine

## 2013-11-10 ENCOUNTER — Encounter (HOSPITAL_COMMUNITY): Payer: Self-pay | Admitting: Emergency Medicine

## 2013-11-10 DIAGNOSIS — R109 Unspecified abdominal pain: Secondary | ICD-10-CM | POA: Diagnosis present

## 2013-11-10 DIAGNOSIS — Z8709 Personal history of other diseases of the respiratory system: Secondary | ICD-10-CM | POA: Insufficient documentation

## 2013-11-10 DIAGNOSIS — Z8742 Personal history of other diseases of the female genital tract: Secondary | ICD-10-CM | POA: Diagnosis not present

## 2013-11-10 DIAGNOSIS — Z920 Personal history of contraception: Secondary | ICD-10-CM | POA: Diagnosis not present

## 2013-11-10 DIAGNOSIS — G8929 Other chronic pain: Secondary | ICD-10-CM | POA: Insufficient documentation

## 2013-11-10 DIAGNOSIS — M545 Low back pain, unspecified: Secondary | ICD-10-CM

## 2013-11-10 DIAGNOSIS — Z3202 Encounter for pregnancy test, result negative: Secondary | ICD-10-CM | POA: Diagnosis not present

## 2013-11-10 DIAGNOSIS — Z8619 Personal history of other infectious and parasitic diseases: Secondary | ICD-10-CM | POA: Insufficient documentation

## 2013-11-10 DIAGNOSIS — Z8659 Personal history of other mental and behavioral disorders: Secondary | ICD-10-CM | POA: Insufficient documentation

## 2013-11-10 DIAGNOSIS — R51 Headache: Secondary | ICD-10-CM | POA: Diagnosis not present

## 2013-11-10 DIAGNOSIS — R519 Headache, unspecified: Secondary | ICD-10-CM

## 2013-11-10 HISTORY — DX: Headache, unspecified: R51.9

## 2013-11-10 HISTORY — DX: Headache: R51

## 2013-11-10 HISTORY — DX: Dorsalgia, unspecified: M54.9

## 2013-11-10 HISTORY — DX: Other chronic pain: G89.29

## 2013-11-10 LAB — CBC WITH DIFFERENTIAL/PLATELET
Basophils Absolute: 0 10*3/uL (ref 0.0–0.1)
Basophils Relative: 0 % (ref 0–1)
EOS PCT: 2 % (ref 0–5)
Eosinophils Absolute: 0.1 10*3/uL (ref 0.0–0.7)
HCT: 35.9 % — ABNORMAL LOW (ref 36.0–46.0)
Hemoglobin: 12.4 g/dL (ref 12.0–15.0)
LYMPHS ABS: 1.9 10*3/uL (ref 0.7–4.0)
Lymphocytes Relative: 37 % (ref 12–46)
MCH: 30.2 pg (ref 26.0–34.0)
MCHC: 34.5 g/dL (ref 30.0–36.0)
MCV: 87.6 fL (ref 78.0–100.0)
Monocytes Absolute: 0.5 10*3/uL (ref 0.1–1.0)
Monocytes Relative: 10 % (ref 3–12)
Neutro Abs: 2.6 10*3/uL (ref 1.7–7.7)
Neutrophils Relative %: 51 % (ref 43–77)
PLATELETS: 165 10*3/uL (ref 150–400)
RBC: 4.1 MIL/uL (ref 3.87–5.11)
RDW: 12.2 % (ref 11.5–15.5)
WBC: 5.1 10*3/uL (ref 4.0–10.5)

## 2013-11-10 LAB — COMPREHENSIVE METABOLIC PANEL
ALT: 10 U/L (ref 0–35)
ANION GAP: 8 (ref 5–15)
AST: 13 U/L (ref 0–37)
Albumin: 4.2 g/dL (ref 3.5–5.2)
Alkaline Phosphatase: 62 U/L (ref 39–117)
BUN: 12 mg/dL (ref 6–23)
CALCIUM: 8.9 mg/dL (ref 8.4–10.5)
CO2: 27 mEq/L (ref 19–32)
Chloride: 103 mEq/L (ref 96–112)
Creatinine, Ser: 0.73 mg/dL (ref 0.50–1.10)
GFR calc non Af Amer: >90 mL/min (ref >90)
GLUCOSE: 92 mg/dL (ref 70–99)
Potassium: 3.9 mEq/L (ref 3.7–5.3)
SODIUM: 138 meq/L (ref 137–147)
TOTAL PROTEIN: 7.2 g/dL (ref 6.0–8.3)
Total Bilirubin: 0.3 mg/dL (ref 0.3–1.2)

## 2013-11-10 LAB — URINALYSIS, ROUTINE W REFLEX MICROSCOPIC
Bilirubin Urine: NEGATIVE
GLUCOSE, UA: NEGATIVE mg/dL
Ketones, ur: NEGATIVE mg/dL
Leukocytes, UA: NEGATIVE
Nitrite: NEGATIVE
PROTEIN: NEGATIVE mg/dL
Specific Gravity, Urine: 1.025 (ref 1.005–1.030)
UROBILINOGEN UA: 1 mg/dL (ref 0.0–1.0)
pH: 7 (ref 5.0–8.0)

## 2013-11-10 LAB — URINE MICROSCOPIC-ADD ON

## 2013-11-10 LAB — LIPASE, BLOOD: Lipase: 28 U/L (ref 11–59)

## 2013-11-10 LAB — PREGNANCY, URINE: PREG TEST UR: NEGATIVE

## 2013-11-10 MED ORDER — IBUPROFEN 400 MG PO TABS
400.0000 mg | ORAL_TABLET | Freq: Once | ORAL | Status: AC
  Administered 2013-11-10: 400 mg via ORAL
  Filled 2013-11-10: qty 1

## 2013-11-10 MED ORDER — TRAMADOL HCL 50 MG PO TABS: 50.0000 mg | ORAL_TABLET | Freq: Four times a day (QID) | ORAL | Status: DC | PRN

## 2013-11-10 MED ORDER — GI COCKTAIL ~~LOC~~
30.0000 mL | Freq: Once | ORAL | Status: AC
  Administered 2013-11-10: 30 mL via ORAL
  Filled 2013-11-10: qty 30

## 2013-11-10 MED ORDER — ACETAMINOPHEN 500 MG PO TABS
1000.0000 mg | ORAL_TABLET | Freq: Once | ORAL | Status: AC
  Administered 2013-11-10: 1000 mg via ORAL
  Filled 2013-11-10: qty 2

## 2013-11-10 MED ORDER — METHOCARBAMOL 500 MG PO TABS: 1000.0000 mg | ORAL_TABLET | Freq: Four times a day (QID) | ORAL | Status: DC | PRN

## 2013-11-10 NOTE — Discharge Instructions (Signed)
°Emergency Department Resource Guide °1) Find a Doctor and Pay Out of Pocket °Although you won't have to find out who is covered by your insurance plan, it is a good idea to ask around and get recommendations. You will then need to call the office and see if the doctor you have chosen will accept you as a new patient and what types of options they offer for patients who are self-pay. Some doctors offer discounts or will set up payment plans for their patients who do not have insurance, but you will need to ask so you aren't surprised when you get to your appointment. ° °2) Contact Your Local Health Department °Not all health departments have doctors that can see patients for sick visits, but many do, so it is worth a call to see if yours does. If you don't know where your local health department is, you can check in your phone book. The CDC also has a tool to help you locate your state's health department, and many state websites also have listings of all of their local health departments. ° °3) Find a Walk-in Clinic °If your illness is not likely to be very severe or complicated, you may want to try a walk in clinic. These are popping up all over the country in pharmacies, drugstores, and shopping centers. They're usually staffed by nurse practitioners or physician assistants that have been trained to treat common illnesses and complaints. They're usually fairly quick and inexpensive. However, if you have serious medical issues or chronic medical problems, these are probably not your best option. ° °No Primary Care Doctor: °- Call [facility] at  832-8000 - they can help you locate a primary care doctor that  accepts your insurance, provides certain services, etc. °- Physician Referral Service- 1-800-533-3463 ° °Chronic Pain Problems: °Organization         Address  Phone   Notes  °[facility]  (336) 297-2271 Patients need to be referred by their primary care doctor.  ° °Medication  Assistance: °Organization         Address  Phone   Notes  °[place] [facility] [address] °[place], [place] [address] (336) 641-8030 --Must be a resident of [place] °-- Must have NO insurance coverage whatsoever (no Medicaid/ Medicare, etc.) °-- The pt. MUST have a primary care doctor that directs their care regularly and follows them in the community °  °[facility]  (866) 331-1348   °[employer]  (888) 892-1162   ° °Agencies that provide inexpensive medical care: °Organization         Address  Phone   Notes  °[facility]  (336) 832-8035   °[facility]    (336) 832-7272   °[facility] [address] °[place], [place] [address] (336) 832-4777   °[facility] [address], °[place] (336) 271-4999   °[facility]    (336) 373-0678   °[facility]    (336) 272-1050   °[facility] ° [address], [place] Phone:  (336) 832-4444, Fax:  (336) 832-4440 Hours of Operation:  9 am - 6 pm, M-F.  Also accepts Medicaid/Medicare and self-pay.  °[facility] ° [address], [place] Phone: (336) 832-3150, Fax: (336) [phone]. Hours of Operation:  8:30 am - 5:30 pm, M-F.  Also accepts Medicaid and self-pay.  °[facility] [place] [address], [place] Phone: (336) 878-6027   °[facility] [address], [place], [place] (336)723-1848, Ext. 123 Mondays & Thursdays: 7-9 AM.  First 15 patients are seen on a first come, first serve basis. °  ° °Medicaid-accepting [place] Providers: ° °Organization         Address  Phone   Notes  °[facility] [address], [place] (336) 641-2100 Also accepts self-pay patients.  °[facility] [address], [place] ° (336) 856-9996   °[facility] [address], [place]  (336) 288-8857   °[facility] [address] [place] [address], [place] (336) 299-7000   °Veita Bland [address], [place]  ° (336) 373-1557 Only accepts [place] Access Medicaid patients after they have their name applied to their card.  ° °Self-Pay (no insurance) in [place]: ° °Organization         Address  Phone   Notes  °Sickle Cell Patients, [facility] [address], [place] (336) 832-1970   °[facility] [address], [place] (336) 832-4400   °[facility] ° [address], [place] (336) 992-4800   °[facility]/Dr. Osei-Bonsu ° [address] [place] [address], [place] or [address], [place] (336) [phone] Phone number for both [place] and [place] locations is the same.  °[facility] [address], [place] (336) 299-0000   °[facility] [address] [place] [address], [place] or [address] (336) 852-7530 °(336) 878-2260   °[facility] [address], [place] (336) 350-1642, phone; (336) 294-5005, fax Sees patients 1st and 3rd Saturday of every month.  Must not qualify for public or private insurance (i.e. Medicaid, Medicare, [place] Health Choice, Veterans' Benefits) • Household income should be no more than 200% of the poverty level •The clinic cannot treat you if you are pregnant or think you are pregnant • Sexually transmitted diseases are not treated at the clinic.  ° ° °Dental Care: °Organization         Address  Phone  Notes  °[place] [facility] [address], [place] (336) 641-6152 Accepts children up to age 21 who are enrolled in Medicaid or [place] Health Choice; pregnant women with a Medicaid card; and children who have applied for Medicaid or [place] Health Choice, but were declined, whose parents can pay a reduced fee at time of service.  °[place]  [facility] [place]  [address], [place] (336) 641-7733 Accepts children up to age 21 who are enrolled in Medicaid or [place] Health Choice; pregnant women with a Medicaid card; and children who have applied for Medicaid or  Health Choice, but were declined, whose parents can pay a reduced fee at time of service.  °[facility] ° [address], [place] (336) 641-4533 Patients are seen by appointment only. Walk-ins are not accepted. [facility] will see patients 18 years of age and older. °Monday - Tuesday (8am-5pm) °Most Wednesdays (8:30-5pm) °$30 per visit, cash only  °[facility] ° [address], [place] (336) 641-4533 Patients are seen by appointment only. Walk-ins are not accepted. [facility] will see patients 18 years of age and older. °One   Wednesday Evening (Monthly: Volunteer Based).  $30 per visit, cash only  °[facility]  (919) 537-3737 for adults; Children under age 4, call Graduate Pediatric Dentistry at (919) [phone]. Children aged 4-14, please call ([phone]) [phone] to request a pediatric application. ° Dental services are provided in all areas of dental care including fillings, crowns and bridges, complete and partial dentures, implants, gum treatment, root canals, and extractions. Preventive care is also provided. Treatment is provided to both adults and children. °Patients are selected via a lottery and there is often a waiting list. °  °[facility] [address], °[place] ° (336) 763-8833 [email or web address] °  °[facility] [address], [place], [place] (336)723-1848, Ext. 123 Second and Fourth Thursday of each month, opens at 6:30 AM; Clinic ends at 9 AM.  Patients are seen on a first-come first-served basis, and a limited number are seen during each clinic.  ° °[facility] ° [address], [place], [place] (336) [phone]    Eligibility Requirements °You must have lived in [place], [place], or [place] counties for at least the last three months. °  You cannot be eligible for state or federal sponsored healthcare insurance, including Veterans Administration, Medicaid, or Medicare. °  You generally cannot be eligible for healthcare insurance through your employer.  °  How to apply: °Eligibility screenings are held every Tuesday and Wednesday afternoon from 1:00 pm until 4:00 pm. You do not need an appointment for the interview!  °[facility] [address], [place], [place] 336-631-2330   °[facility]  336-342-8273   °[facility]  336-703-3100   °[facility]  336-570-6415   ° °Behavioral Health Resources in the Community: °Intensive Outpatient Programs °Organization         Address  Phone  Notes  °[facility] [address], [place], [place] 336-878-6098   °[facility] [address], [place], [place] 336-832-9800   °[facility] [address], [place], [place] ° 336-882-2125   °[facility] [address],  °[place], [place] 1-800-853-5163 or 336-641-4981   °Substance Abuse Resources °Organization         Address  Phone  Notes  °[facility]  336-882-2125   °[facility]  336-784-9470   °[facility]  336-285-9073   °[facility]  336-845-3988   °Residential & Outpatient Substance Abuse Program  1-800-659-3381   °Psychological Services °Organization         Address  Phone  Notes  °[facility]  336- 832-9600   °[facility]  336- 378-7881   °[facility] [address], [place] 1-800-853-5163 or 336-641-4981   ° °Mobile Crisis Teams °Organization         Address  Phone  Notes  °[facility], Mobile Crisis Care Unit  1-877-626-1772   °Assertive °[facility] ° [address]  [place], [place] 336-834-9664   °Sharon DeEsch [address] °[place] 336-554-5454   ° °Self-Help/Support Groups °Organization         Address  Phone             Notes  °[facility]  - variety of support groups  336- 373-1402 Call for more information  °Narcotics Anonymous (NA), [facility] [address], °[place]  2 meetings at this location  ° °  Residential Treatment Programs Organization         Address  Phone  Notes  [facility] [address],    [place]  1-(772)331-5464   [facility]  [address] [place] [address], [place], [place]   [place], [place] (445) 350-3063 Admissions: 8am-3pm M-F  [facility] [place] [address]    [place], [place] [phone]   [facility] [address] [place], [place], [place]   [facility] [address]  [place], [place]   [facility] Intensive Outpatient [address], [place], [place]   [facility] ([place]  [place], [place] 1-647-464-1753 or 660-402-8526   [facility] [address] [place], [place] Accepts Medicaid  [facility] [address],  [place] 1-516-043-4339 Substance Abuse/Addiction Treatment   [facility]         Address  Phone  Notes  [facility]  8472803697   Domenic Schwab, PhD [address] [place], [place]   (548)079-5049 or (210)718-2115   [facility] [place], [place] 701-809-6918   [facility] [address] [place], [place] 559-859-0098 Insurance/Medicaid/sponsorship through [facility] [address] [place]                                    [place], [place] 725-861-3355 [place] [address], [place] ([phone])[phone]    Dr. Adele Schilder  ([phone]   [facility] 1) [address], [place] 2) [place] 3)  [address], [place] [phone] [phone]  [phone]   [place] [phone] or (947) [phone] (After Hours)       Take the prescriptions as directed. May also take over the counter tylenol, as directed on packaging, as needed for discomfort. Apply moist heat or ice to the area(s) of discomfort, for 15 minutes at a time, several times per day for the next few days.  Do not fall asleep on a heating or ice pack. Eat a bland diet, avoiding greasy, fatty, fried foods, as well as spicy and acidic foods or beverages.  Avoid eating within the hour or 2 before going to bed or laying down.  Also avoid teas, colas, coffee, chocolate, pepermint and spearment. Take over the counter maalox/mylanta, as directed on packaging, as needed for discomfort.  Call your regular medical doctor tomorrow to schedule a follow up appointment this week.  Return to the Emergency Department immediately if worsening.

## 2013-11-10 NOTE — ED Provider Notes (Signed)
CSN: [id number]     Arrival date & time 11/10/13  1927 History   First MD Initiated Contact with Patient 11/10/13 2038     Chief Complaint  Patient presents with  . Headache  . Back Pain  . Abdominal Pain      HPI Pt was seen at 2040.  Per pt, c/o gradual onset and persistence of constant multiple symptoms for the past 3 days. Pt c/o frontal headache, upper abd "pain," and right sided low back "pain." Pt has not taken any medications for her symptoms. Pt states her low back pain began "after I was standing working the [employer] for a long time." LBP worsens with palpation of the area and body position changes. Pt denies headache was sudden or maximal at onset or at any time. Denies CP/SOB, no N/V/D, no cough, no sore throat, no flank pain, no dysuria, no rash, no fevers, incont/retention of bowel or bladder, no saddle anesthesia, no focal motor weakness, no tingling/numbness in extremities, no visual changes, no direct injury.     Past Medical History  Diagnosis Date  . Right sided abdominal pain   . Scoliosis   . Chlamydia     treated 6/6  . Depression   . Yeast infection 07/03/2012  . Seasonal allergies 07/03/2012  . Vaginal discharge 10/03/2012  . Contraceptive management 01/25/2013  . Threatened abortion in early pregnancy 03/20/2013  . Miscarriage 03/27/2013  . Nexplanon insertion 04/11/2013    nexplanon inserted 04/11/13 remove 3/32/18  . Irregular bleeding 06/03/2013  . Sinus infection 08/27/2013  . Chronic headaches   . Chronic back pain    History reviewed. No pertinent past surgical history.  Family History  Problem Relation Age of Onset  . Asthma Mother   . Asthma Sister   . Asthma Brother   . Asthma Daughter   . Diabetes Maternal Grandmother   . Hypertension Maternal Grandmother   . Asthma Maternal Grandmother   . Heart disease Maternal Grandmother   . Stroke Maternal Grandmother   . Hypertension Maternal Grandfather   . Asthma Maternal Grandfather   . Heart  disease Maternal Grandfather   . Asthma Son    History  Substance Use Topics  . Smoking status: Never Smoker   . Smokeless tobacco: Never Used  . Alcohol Use: No   OB History   Grav Para Term Preterm Abortions TAB SAB Ect Mult Living   4 2 2  0 2 0 2 0 0 2     Review of Systems ROS: Statement: All systems negative except as marked or noted in the HPI; Constitutional: Negative for fever and chills. ; ; Eyes: Negative for eye pain, redness and discharge. ; ; ENMT: Negative for ear pain, hoarseness, nasal congestion, sinus pressure and sore throat. ; ; Cardiovascular: Negative for chest pain, palpitations, diaphoresis, dyspnea and peripheral edema. ; ; Respiratory: Negative for cough, wheezing and stridor. ; ; Gastrointestinal: +abd pain. Negative for nausea, vomiting, diarrhea, blood in stool, hematemesis, jaundice and rectal bleeding. . ; ; Genitourinary: Negative for dysuria, flank pain and hematuria. ; ; Musculoskeletal: +LBP. Negative for neck pain. Negative for swelling and trauma.; ; Skin: Negative for pruritus, rash, abrasions, blisters, bruising and skin lesion.; ; Neuro: +frontal headache. Negative for lightheadedness and neck stiffness. Negative for weakness, altered level of consciousness , altered mental status, extremity weakness, paresthesias, involuntary movement, seizure and syncope.        Allergies  Review of patient's allergies indicates no known allergies.  Home  Medications   Prior to Admission medications   Medication Sig Start Date End Date Taking? Authorizing Provider  ibuprofen (ADVIL,MOTRIN) 200 MG tablet Take 600 mg by mouth every 6 (six) hours as needed for headache.   Yes Historical Provider, MD  etonogestrel (NEXPLANON) 68 MG IMPL implant Inject 1 each into the skin once.    Historical Provider, MD   BP 102/63  Pulse 78  Temp(Src) 98.4 F (36.9 C) (Oral)  Resp 24  Ht 5\' 2"  (1.575 m)  Wt 137 lb (62.143 kg)  BMI 25.05 kg/m2  SpO2 100%  LMP  10/27/2013 Physical Exam 2045; Physical examination:  Nursing notes reviewed; Vital signs and O2 SAT reviewed;  Constitutional: Well developed, Well nourished, Well hydrated, In no acute distress; Head:  Normocephalic, atraumatic; Eyes: EOMI, PERRL, No scleral icterus; ENMT: TM's clear bilat. +edemetous nasal turbinates bilat with clear rhinorrhea. Mouth and pharynx normal, Mucous membranes moist; Neck: Supple, Full range of motion, No lymphadenopathy; Cardiovascular: Regular rate and rhythm, No murmur, rub, or gallop; Respiratory: Breath sounds clear & equal bilaterally, No rales, rhonchi, wheezes.  Speaking full sentences with ease, Normal respiratory effort/excursion; Chest: Nontender, Movement normal; Abdomen: Soft, +very mild mid-epigastric tenderness to palp. No rebound or guarding. Nondistended, Normal bowel sounds; Genitourinary: No CVA tenderness; Spine:  No midline CS, TS, LS tenderness. +TTP right lumbar paraspinal muscles.;; Extremities: Pulses normal, No tenderness, No edema, No calf edema or asymmetry.; Neuro: AA&Ox3, Major CN grossly intact.  Speech clear. No gross focal motor or sensory deficits in extremities. Climbs on and off stretcher easily by herself. Gait steady.; Skin: Color normal, Warm, Dry.   ED Course  Procedures     MDM  MDM Reviewed: previous chart, nursing note and vitals Reviewed previous: labs Interpretation: labs     Results for orders placed during the hospital encounter of 11/10/13  CBC WITH DIFFERENTIAL      Result Value Ref Range   WBC 5.1  4.0 - 10.5 K/uL   RBC 4.10  3.87 - 5.11 MIL/uL   Hemoglobin 12.4  12.0 - 15.0 g/dL   HCT 35.9 (*) 36.0 - 46.0 %   MCV 87.6  78.0 - 100.0 fL   MCH 30.2  26.0 - 34.0 pg   MCHC 34.5  30.0 - 36.0 g/dL   RDW 12.2  11.5 - 15.5 %   Platelets 165  150 - 400 K/uL   Neutrophils Relative % 51  43 - 77 %   Neutro Abs 2.6  1.7 - 7.7 K/uL   Lymphocytes Relative 37  12 - 46 %   Lymphs Abs 1.9  0.7 - 4.0 K/uL   Monocytes  Relative 10  3 - 12 %   Monocytes Absolute 0.5  0.1 - 1.0 K/uL   Eosinophils Relative 2  0 - 5 %   Eosinophils Absolute 0.1  0.0 - 0.7 K/uL   Basophils Relative 0  0 - 1 %   Basophils Absolute 0.0  0.0 - 0.1 K/uL  COMPREHENSIVE METABOLIC PANEL      Result Value Ref Range   Sodium 138  137 - 147 mEq/L   Potassium 3.9  3.7 - 5.3 mEq/L   Chloride 103  96 - 112 mEq/L   CO2 27  19 - 32 mEq/L   Glucose, Bld 92  70 - 99 mg/dL   BUN 12  6 - 23 mg/dL   Creatinine, Ser 0.73  0.50 - 1.10 mg/dL   Calcium 8.9  8.4 - 10.5 mg/dL  Total Protein 7.2  6.0 - 8.3 g/dL   Albumin 4.2  3.5 - 5.2 g/dL   AST 13  0 - 37 U/L   ALT 10  0 - 35 U/L   Alkaline Phosphatase 62  39 - 117 U/L   Total Bilirubin 0.3  0.3 - 1.2 mg/dL   GFR calc non Af Amer >90  >90 mL/min   GFR calc Af Amer >90  >90 mL/min   Anion gap 8  5 - 15  LIPASE, BLOOD      Result Value Ref Range   Lipase 28  11 - 59 U/L  URINALYSIS, ROUTINE W REFLEX MICROSCOPIC      Result Value Ref Range   Color, Urine YELLOW  YELLOW   APPearance HAZY (*) CLEAR   Specific Gravity, Urine 1.025  1.005 - 1.030   pH 7.0  5.0 - 8.0   Glucose, UA NEGATIVE  NEGATIVE mg/dL   Hgb urine dipstick TRACE (*) NEGATIVE   Bilirubin Urine NEGATIVE  NEGATIVE   Ketones, ur NEGATIVE  NEGATIVE mg/dL   Protein, ur NEGATIVE  NEGATIVE mg/dL   Urobilinogen, UA 1.0  0.0 - 1.0 mg/dL   Nitrite NEGATIVE  NEGATIVE   Leukocytes, UA NEGATIVE  NEGATIVE  PREGNANCY, URINE      Result Value Ref Range   Preg Test, Ur NEGATIVE  NEGATIVE  URINE MICROSCOPIC-ADD ON      Result Value Ref Range   WBC, UA 0-2  <3 WBC/hpf   RBC / HPF 0-2  <3 RBC/hpf   Bacteria, UA RARE  RARE    2155:  Workup reassuring. Pt has tol PO well while in the ED without N/V. No stooling while in the ED. Feels better after meds and wants to go home now. Continue to tx symptomatically at this time. Dx and testing d/w pt.  Questions answered.  Verb understanding, agreeable to d/c home with outpt  f/u.     Francine Graven, DO 11/12/13 [phone]

## 2013-11-10 NOTE — ED Notes (Signed)
Pt c/o headache x 3 days, abdominal pain, and back pain.

## 2013-11-18 ENCOUNTER — Encounter (HOSPITAL_COMMUNITY): Payer: Self-pay | Admitting: Emergency Medicine

## 2013-12-26 ENCOUNTER — Encounter: Payer: Self-pay | Admitting: Orthopedic Surgery

## 2013-12-26 ENCOUNTER — Ambulatory Visit: Payer: Medicaid Other | Admitting: Orthopedic Surgery

## 2014-01-22 ENCOUNTER — Other Ambulatory Visit: Payer: Medicaid Other | Admitting: Adult Health

## 2014-01-22 ENCOUNTER — Encounter: Payer: Self-pay | Admitting: Adult Health

## 2014-02-06 ENCOUNTER — Other Ambulatory Visit (HOSPITAL_COMMUNITY)
Admission: RE | Admit: 2014-02-06 | Discharge: 2014-02-06 | Disposition: A | Discharge status: Home / Self Care | Payer: Medicaid Other | Source: Ambulatory Visit | Attending: Adult Health | Admitting: Adult Health

## 2014-02-06 ENCOUNTER — Encounter: Payer: Self-pay | Admitting: Adult Health

## 2014-02-06 ENCOUNTER — Ambulatory Visit (INDEPENDENT_AMBULATORY_CARE_PROVIDER_SITE_OTHER): Payer: Medicaid Other | Admitting: Adult Health

## 2014-02-06 VITALS — BP 104/64 | HR 74 | Ht 63.0 in | Wt 140.0 lb

## 2014-02-06 DIAGNOSIS — Z975 Presence of (intrauterine) contraceptive device: Secondary | ICD-10-CM

## 2014-02-06 DIAGNOSIS — Z113 Encounter for screening for infections with a predominantly sexual mode of transmission: Secondary | ICD-10-CM | POA: Insufficient documentation

## 2014-02-06 DIAGNOSIS — N926 Irregular menstruation, unspecified: Secondary | ICD-10-CM

## 2014-02-06 DIAGNOSIS — Z01419 Encounter for gynecological examination (general) (routine) without abnormal findings: Secondary | ICD-10-CM | POA: Insufficient documentation

## 2014-02-06 DIAGNOSIS — Z308 Encounter for other contraceptive management: Secondary | ICD-10-CM

## 2014-02-06 MED ORDER — MEGESTROL ACETATE 40 MG PO TABS: 40.0000 mg | ORAL_TABLET | Freq: Every day | ORAL | Status: DC

## 2014-02-06 NOTE — Progress Notes (Signed)
Patient ID: Deanna Hughes, female   DOB: [birth year], 24 y.o.   MRN: [id number] History of Present Illness: Deanna Hughes is a 24 year old black female in for pap and physical, has family planning medicaid.Complains of spotting with nexplanon.   Current Medications, Allergies, Past Medical History, Past Surgical History, Family History and Social History were reviewed in [employer] record.     Review of Systems: Patient denies any headaches, blurred vision, shortness of breath, chest pain, abdominal pain, problems with bowel movements, urination, or intercourse. No joint pain or mood swings, see HPI.    Physical Exam:BP 104/64 mmHg  Pulse 74  Ht 5\' 3"  (1.6 m)  Wt 140 lb (63.504 kg)  BMI 24.81 kg/m2  LMP 01/21/2014 General:  Well developed, well nourished, no acute distress Skin:  Warm and dry Neck:  Midline trachea, normal thyroid Lungs; Clear to auscultation bilaterally Breast:  No dominant palpable mass, retraction, or nipple discharge Cardiovascular: Regular rate and rhythm Abdomen:  Soft, non tender, no hepatosplenomegaly Pelvic:  External genitalia is normal in appearance.  The vagina is normal in appearance. The cervix is bulbous, pap with GC/CHL performed.  Uterus is felt to be normal size, shape, and contour.  No                adnexal masses or tenderness noted. Extremities:  No swelling or varicosities noted Psych:  No mood changes,alert and cooperative,seems happy   Impression:  Well woman gyn exam with pap Irregular bleeding Nexplanon in place    Plan: Physical in 1 year Check HIV,RPR and HSV 2 Use condoms  Rx megace 40 mg #30 1 daily with spotting, no refills

## 2014-02-06 NOTE — Patient Instructions (Signed)
Physical in 1 year Try megace with spotting Use condoms

## 2014-02-07 LAB — RPR

## 2014-02-07 LAB — HIV ANTIBODY (ROUTINE TESTING W REFLEX): HIV 1&2 Ab, 4th Generation: NONREACTIVE

## 2014-02-10 LAB — HSV 2 ANTIBODY, IGG: HSV 2 Glycoprotein G Ab, IgG: <0.1 IV

## 2014-02-12 LAB — CYTOLOGY - PAP

## 2014-06-10 ENCOUNTER — Ambulatory Visit: Payer: Medicaid Other | Admitting: Adult Health

## 2014-06-15 ENCOUNTER — Encounter (HOSPITAL_COMMUNITY): Payer: Self-pay | Admitting: *Deleted

## 2014-06-15 ENCOUNTER — Emergency Department (HOSPITAL_COMMUNITY)
Admission: EM | Admit: 2014-06-15 | Discharge: 2014-06-15 | Disposition: A | Discharge status: Home / Self Care | Payer: Medicaid Other | Attending: Emergency Medicine | Admitting: Emergency Medicine

## 2014-06-15 DIAGNOSIS — N760 Acute vaginitis: Secondary | ICD-10-CM | POA: Insufficient documentation

## 2014-06-15 DIAGNOSIS — Z79899 Other long term (current) drug therapy: Secondary | ICD-10-CM | POA: Insufficient documentation

## 2014-06-15 DIAGNOSIS — Z8709 Personal history of other diseases of the respiratory system: Secondary | ICD-10-CM | POA: Insufficient documentation

## 2014-06-15 DIAGNOSIS — Z3202 Encounter for pregnancy test, result negative: Secondary | ICD-10-CM | POA: Insufficient documentation

## 2014-06-15 DIAGNOSIS — B9689 Other specified bacterial agents as the cause of diseases classified elsewhere: Secondary | ICD-10-CM

## 2014-06-15 DIAGNOSIS — Z8619 Personal history of other infectious and parasitic diseases: Secondary | ICD-10-CM | POA: Insufficient documentation

## 2014-06-15 DIAGNOSIS — M419 Scoliosis, unspecified: Secondary | ICD-10-CM | POA: Insufficient documentation

## 2014-06-15 DIAGNOSIS — R102 Pelvic and perineal pain: Secondary | ICD-10-CM

## 2014-06-15 DIAGNOSIS — G8929 Other chronic pain: Secondary | ICD-10-CM | POA: Insufficient documentation

## 2014-06-15 DIAGNOSIS — N72 Inflammatory disease of cervix uteri: Secondary | ICD-10-CM

## 2014-06-15 DIAGNOSIS — Z793 Long term (current) use of hormonal contraceptives: Secondary | ICD-10-CM | POA: Insufficient documentation

## 2014-06-15 DIAGNOSIS — F329 Major depressive disorder, single episode, unspecified: Secondary | ICD-10-CM | POA: Insufficient documentation

## 2014-06-15 LAB — URINALYSIS, ROUTINE W REFLEX MICROSCOPIC
Bilirubin Urine: NEGATIVE
Glucose, UA: NEGATIVE mg/dL
Hgb urine dipstick: NEGATIVE
Ketones, ur: NEGATIVE mg/dL
Nitrite: NEGATIVE
Protein, ur: NEGATIVE mg/dL
Specific Gravity, Urine: 1.02 (ref 1.005–1.030)
Urobilinogen, UA: 1 mg/dL (ref 0.0–1.0)
pH: 7 (ref 5.0–8.0)

## 2014-06-15 LAB — BASIC METABOLIC PANEL
Anion gap: 6 (ref 5–15)
BUN: 6 mg/dL (ref 6–20)
CALCIUM: 8.8 mg/dL — AB (ref 8.9–10.3)
CHLORIDE: 105 mmol/L (ref 101–111)
CO2: 26 mmol/L (ref 22–32)
Creatinine, Ser: 0.6 mg/dL (ref 0.44–1.00)
GFR calc non Af Amer: >60 mL/min (ref >60)
Glucose, Bld: 98 mg/dL (ref 65–99)
POTASSIUM: 3.8 mmol/L (ref 3.5–5.1)
Sodium: 137 mmol/L (ref 135–145)

## 2014-06-15 LAB — WET PREP, GENITAL
TRICH WET PREP: NONE SEEN
Yeast Wet Prep HPF POC: NONE SEEN

## 2014-06-15 LAB — CBC WITH DIFFERENTIAL/PLATELET
BASOS PCT: 0 % (ref 0–1)
Basophils Absolute: 0 10*3/uL (ref 0.0–0.1)
EOS ABS: 0.1 10*3/uL (ref 0.0–0.7)
EOS PCT: 1 % (ref 0–5)
HCT: 34.8 % — ABNORMAL LOW (ref 36.0–46.0)
HEMOGLOBIN: 11.7 g/dL — AB (ref 12.0–15.0)
LYMPHS ABS: 2 10*3/uL (ref 0.7–4.0)
LYMPHS PCT: 27 % (ref 12–46)
MCH: 30 pg (ref 26.0–34.0)
MCHC: 33.6 g/dL (ref 30.0–36.0)
MCV: 89.2 fL (ref 78.0–100.0)
MONO ABS: 0.6 10*3/uL (ref 0.1–1.0)
Monocytes Relative: 8 % (ref 3–12)
Neutro Abs: 4.6 10*3/uL (ref 1.7–7.7)
Neutrophils Relative %: 64 % (ref 43–77)
PLATELETS: 175 10*3/uL (ref 150–400)
RBC: 3.9 MIL/uL (ref 3.87–5.11)
RDW: 12.5 % (ref 11.5–15.5)
WBC: 7.2 10*3/uL (ref 4.0–10.5)

## 2014-06-15 LAB — URINE MICROSCOPIC-ADD ON

## 2014-06-15 LAB — PREGNANCY, URINE: Preg Test, Ur: NEGATIVE

## 2014-06-15 MED ORDER — AZITHROMYCIN 250 MG PO TABS
1000.0000 mg | ORAL_TABLET | Freq: Once | ORAL | Status: AC
  Administered 2014-06-15: 1000 mg via ORAL
  Filled 2014-06-15: qty 4

## 2014-06-15 MED ORDER — KETOROLAC TROMETHAMINE 60 MG/2ML IM SOLN
30.0000 mg | Freq: Once | INTRAMUSCULAR | Status: AC
  Administered 2014-06-15: 30 mg via INTRAMUSCULAR
  Filled 2014-06-15: qty 2

## 2014-06-15 MED ORDER — METRONIDAZOLE 500 MG PO TABS: 500.0000 mg | ORAL_TABLET | Freq: Two times a day (BID) | ORAL | Status: DC

## 2014-06-15 MED ORDER — NAPROXEN 500 MG PO TABS: 500.0000 mg | ORAL_TABLET | Freq: Two times a day (BID) | ORAL | Status: DC

## 2014-06-15 MED ORDER — CEFTRIAXONE SODIUM 250 MG IJ SOLR
250.0000 mg | Freq: Once | INTRAMUSCULAR | Status: AC
  Administered 2014-06-15: 250 mg via INTRAMUSCULAR
  Filled 2014-06-15: qty 250

## 2014-06-15 MED ORDER — LIDOCAINE HCL (PF) 1 % IJ SOLN
INTRAMUSCULAR | Status: AC
  Filled 2014-06-15: qty 5

## 2014-06-15 NOTE — ED Notes (Signed)
Pt states her left side has been hurting x 3 days, also co bilateral feet swelling. Denies N/V/D.

## 2014-06-15 NOTE — ED Provider Notes (Signed)
CSN: [id number]     Arrival date & time 06/15/14  1533 History   First MD Initiated Contact with Patient 06/15/14 1609     Chief Complaint  Patient presents with  . Abdominal Pain    left side     (Consider location/radiation/quality/duration/timing/severity/associated sxs/prior Treatment) Patient is a 24 y.o. female presenting with abdominal pain. The history is provided by the patient.  Abdominal Pain Pain location:  LLQ Pain quality: sharp   Pain radiates to:  Does not radiate Pain severity:  Moderate Onset quality:  Gradual Duration:  3 days Timing:  Intermittent Chronicity:  New Associated symptoms: vaginal discharge   Associated symptoms: no chest pain, no chills, no cough, no dysuria, no fever, no nausea, no shortness of breath, no vaginal bleeding and no vomiting    TIMEKA GOETTE is a 25 y.o. [id number] who presents to the ED with abdominal pain that started 3 days ago. She has an implant for birth control. Current sex partner x 3 years, hx of Chlamydia. Last pap smear less than one year and was normal. She describes the pain as sharp.   Past Medical History  Diagnosis Date  . Right sided abdominal pain   . Scoliosis   . Chlamydia     treated 6/6  . Depression   . Yeast infection 07/03/2012  . Seasonal allergies 07/03/2012  . Vaginal discharge 10/03/2012  . Contraceptive management 01/25/2013  . Threatened abortion in early pregnancy 03/20/2013  . Miscarriage 03/27/2013  . Nexplanon insertion 04/11/2013    nexplanon inserted 04/11/13 remove 3/32/18  . Irregular bleeding 06/03/2013  . Sinus infection 08/27/2013  . Chronic headaches   . Chronic back pain    History reviewed. No pertinent past surgical history. Family History  Problem Relation Age of Onset  . Asthma Mother   . Asthma Sister   . Asthma Brother   . Asthma Daughter   . Diabetes Maternal Grandmother   . Hypertension Maternal Grandmother   . Asthma Maternal Grandmother   . Heart disease Maternal  Grandmother   . Stroke Maternal Grandmother   . Hypertension Maternal Grandfather   . Asthma Maternal Grandfather   . Heart disease Maternal Grandfather   . Asthma Son    History  Substance Use Topics  . Smoking status: Never Smoker   . Smokeless tobacco: Never Used  . Alcohol Use: No   OB History    Gravida Para Term Preterm AB TAB SAB Ectopic Multiple Living   4 2 2  0 2 0 2 0 0 2     Review of Systems  Constitutional: Negative for fever and chills.  HENT: Negative.   Eyes: Negative for visual disturbance.  Respiratory: Negative for cough, shortness of breath and wheezing.   Cardiovascular: Negative for chest pain and palpitations.  Gastrointestinal: Positive for abdominal pain. Negative for nausea and vomiting.  Genitourinary: Positive for vaginal discharge and pelvic pain. Negative for dysuria, urgency, frequency and vaginal bleeding.  Skin: Positive for wound.       Wound to right ankle  Neurological: Negative for dizziness and syncope.  Psychiatric/Behavioral: Negative for confusion. The patient is not nervous/anxious.       Allergies  Review of patient's allergies indicates no known allergies.  Home Medications   Prior to Admission medications   Medication Sig Start Date End Date Taking? Authorizing Provider  Multiple Vitamin (MULTIVITAMIN WITH MINERALS) TABS tablet Take 1 tablet by mouth daily.   Yes Historical Provider, MD  etonogestrel (NEXPLANON) 68  MG IMPL implant Inject 1 each into the skin once.    Historical Provider, MD  megestrol (MEGACE) 40 MG tablet Take 1 tablet (40 mg total) by mouth daily. 02/06/14   Estill Dooms, NP  methocarbamol (ROBAXIN) 500 MG tablet Take 2 tablets (1,000 mg total) by mouth 4 (four) times daily as needed for muscle spasms (muscle spasm/pain). Patient not taking: Reported on 02/06/2014 11/10/13   Francine Graven, DO  metroNIDAZOLE (FLAGYL) 500 MG tablet Take 1 tablet (500 mg total) by mouth 2 (two) times daily. 06/15/14    [place], NP  naproxen (NAPROSYN) 500 MG tablet Take 1 tablet (500 mg total) by mouth 2 (two) times daily. 06/15/14   Loye Reininger Bunnie Pion, NP  traMADol (ULTRAM) 50 MG tablet Take 1 tablet (50 mg total) by mouth every 6 (six) hours as needed. Patient not taking: Reported on 02/06/2014 11/10/13   Francine Graven, DO   BP 112/63 mmHg  Pulse 88  Temp(Src) 98.9 F (37.2 C) (Oral)  Resp 18  SpO2 100%  LMP 05/22/2014 Physical Exam  Constitutional: She is oriented to person, place, and time. She appears well-developed and well-nourished.  HENT:  Head: Normocephalic.  Eyes: Conjunctivae and EOM are normal.  Neck: Neck supple.  Cardiovascular: Normal rate.   Pulmonary/Chest: Effort normal.  Abdominal: Soft. There is tenderness in the suprapubic area and left lower quadrant. There is no rebound and no CVA tenderness.  Genitourinary:  External genitalia without lesions. Yellow d/c vaginal vault, cervix inflamed. Left adnexal tenderness. Uterus without palpable enlargement.   Musculoskeletal: Normal range of motion.  No swelling of feet noted at this time  Neurological: She is alert and oriented to person, place, and time. No cranial nerve deficit.  Skin: Skin is warm and dry.  Psychiatric: She has a normal mood and affect. Her behavior is normal.  Nursing note and vitals reviewed.   ED Course  Procedures  Rocephin 250 mg IM, Zithromax 1 gram PO, Rx Flagyl GC, Chlamydia pending MDM  24 y.o. female with pelvic pain and vaginal d/c x 3 days. Stable for d/c with out acute abdomen. Will treat for Cervicitis and BV, she will follow up with Dr. Glo Herring. Discussed with the patient and all questioned fully answered. She agrees with plan  She will return if any problems arise.   Final diagnoses:  Pelvic pain in female  Cervicitis  Bacterial vaginosis       Ashley Murrain, NP 06/17/14 1093  Virgel Manifold, MD 06/17/14 [phone]

## 2014-06-15 NOTE — Discharge Instructions (Signed)
Follow up with Dr. Glo Herring or return here as needed.

## 2014-06-17 LAB — GC/CHLAMYDIA PROBE AMP (~~LOC~~) NOT AT ARMC
Chlamydia: NEGATIVE
Neisseria Gonorrhea: NEGATIVE

## 2014-07-05 ENCOUNTER — Emergency Department (HOSPITAL_COMMUNITY)
Admission: EM | Admit: 2014-07-05 | Discharge: 2014-07-05 | Disposition: A | Discharge status: Home / Self Care | Payer: Medicaid Other | Attending: Emergency Medicine | Admitting: Emergency Medicine

## 2014-07-05 ENCOUNTER — Encounter (HOSPITAL_COMMUNITY): Payer: Self-pay | Admitting: Emergency Medicine

## 2014-07-05 ENCOUNTER — Emergency Department (HOSPITAL_COMMUNITY): Payer: Medicaid Other

## 2014-07-05 DIAGNOSIS — N83202 Unspecified ovarian cyst, left side: Secondary | ICD-10-CM

## 2014-07-05 DIAGNOSIS — G8929 Other chronic pain: Secondary | ICD-10-CM | POA: Insufficient documentation

## 2014-07-05 DIAGNOSIS — R1030 Lower abdominal pain, unspecified: Secondary | ICD-10-CM

## 2014-07-05 DIAGNOSIS — Z8709 Personal history of other diseases of the respiratory system: Secondary | ICD-10-CM | POA: Insufficient documentation

## 2014-07-05 DIAGNOSIS — Z79899 Other long term (current) drug therapy: Secondary | ICD-10-CM | POA: Insufficient documentation

## 2014-07-05 DIAGNOSIS — Z3202 Encounter for pregnancy test, result negative: Secondary | ICD-10-CM | POA: Insufficient documentation

## 2014-07-05 DIAGNOSIS — N390 Urinary tract infection, site not specified: Secondary | ICD-10-CM

## 2014-07-05 DIAGNOSIS — N8329 Other ovarian cysts: Secondary | ICD-10-CM | POA: Insufficient documentation

## 2014-07-05 DIAGNOSIS — Z792 Long term (current) use of antibiotics: Secondary | ICD-10-CM | POA: Insufficient documentation

## 2014-07-05 DIAGNOSIS — M419 Scoliosis, unspecified: Secondary | ICD-10-CM | POA: Insufficient documentation

## 2014-07-05 DIAGNOSIS — Z8659 Personal history of other mental and behavioral disorders: Secondary | ICD-10-CM | POA: Insufficient documentation

## 2014-07-05 LAB — CBC WITH DIFFERENTIAL/PLATELET
Basophils Absolute: 0 10*3/uL (ref 0.0–0.1)
Basophils Relative: 0 % (ref 0–1)
EOS PCT: 1 % (ref 0–5)
Eosinophils Absolute: 0.1 10*3/uL (ref 0.0–0.7)
HEMATOCRIT: 36.1 % (ref 36.0–46.0)
Hemoglobin: 12.2 g/dL (ref 12.0–15.0)
LYMPHS ABS: 1.9 10*3/uL (ref 0.7–4.0)
LYMPHS PCT: 31 % (ref 12–46)
MCH: 30.3 pg (ref 26.0–34.0)
MCHC: 33.8 g/dL (ref 30.0–36.0)
MCV: 89.6 fL (ref 78.0–100.0)
MONO ABS: 0.5 10*3/uL (ref 0.1–1.0)
MONOS PCT: 8 % (ref 3–12)
Neutro Abs: 3.7 10*3/uL (ref 1.7–7.7)
Neutrophils Relative %: 60 % (ref 43–77)
Platelets: 198 10*3/uL (ref 150–400)
RBC: 4.03 MIL/uL (ref 3.87–5.11)
RDW: 12.7 % (ref 11.5–15.5)
WBC: 6.2 10*3/uL (ref 4.0–10.5)

## 2014-07-05 LAB — URINALYSIS, ROUTINE W REFLEX MICROSCOPIC
BILIRUBIN URINE: NEGATIVE
GLUCOSE, UA: NEGATIVE mg/dL
HGB URINE DIPSTICK: NEGATIVE
Nitrite: NEGATIVE
PH: 6 (ref 5.0–8.0)
PROTEIN: NEGATIVE mg/dL
Specific Gravity, Urine: 1.025 (ref 1.005–1.030)
Urobilinogen, UA: 0.2 mg/dL (ref 0.0–1.0)

## 2014-07-05 LAB — BASIC METABOLIC PANEL
Anion gap: 7 (ref 5–15)
BUN: 9 mg/dL (ref 6–20)
CALCIUM: 8.8 mg/dL — AB (ref 8.9–10.3)
CHLORIDE: 105 mmol/L (ref 101–111)
CO2: 26 mmol/L (ref 22–32)
Creatinine, Ser: 0.57 mg/dL (ref 0.44–1.00)
GFR calc Af Amer: >60 mL/min (ref >60)
GFR calc non Af Amer: >60 mL/min (ref >60)
GLUCOSE: 89 mg/dL (ref 65–99)
Potassium: 3.8 mmol/L (ref 3.5–5.1)
SODIUM: 138 mmol/L (ref 135–145)

## 2014-07-05 LAB — URINE MICROSCOPIC-ADD ON

## 2014-07-05 LAB — POC URINE PREG, ED: Preg Test, Ur: NEGATIVE

## 2014-07-05 MED ORDER — HYDROCODONE-ACETAMINOPHEN 5-325 MG PO TABS: 2.0000 | ORAL_TABLET | ORAL | Status: DC | PRN

## 2014-07-05 MED ORDER — IOHEXOL 300 MG/ML  SOLN
100.0000 mL | Freq: Once | INTRAMUSCULAR | Status: AC | PRN
  Administered 2014-07-05: 100 mL via INTRAVENOUS

## 2014-07-05 MED ORDER — NITROFURANTOIN MONOHYD MACRO 100 MG PO CAPS: 100.0000 mg | ORAL_CAPSULE | Freq: Two times a day (BID) | ORAL | Status: DC

## 2014-07-05 MED ORDER — IOHEXOL 300 MG/ML  SOLN
25.0000 mL | Freq: Once | INTRAMUSCULAR | Status: AC | PRN
  Administered 2014-07-05: 25 mL via ORAL

## 2014-07-05 MED ORDER — IBUPROFEN 800 MG PO TABS: 800.0000 mg | ORAL_TABLET | Freq: Three times a day (TID) | ORAL | Status: DC

## 2014-07-05 NOTE — ED Provider Notes (Signed)
CSN: [id number]     Arrival date & time 07/05/14  1714 History   First MD Initiated Contact with Patient 07/05/14 1736     Chief Complaint  Patient presents with  . Abdominal Pain     (Consider location/radiation/quality/duration/timing/severity/associated sxs/prior Treatment) HPI Comments: Patient returns to the emergency room for further evaluation of pain on the left side of her abdomen and lower pelvic region. Patient reports she has been seen previously for this. She was told she had a bacterial infection was treated with antibiotic, was to follow-up with OB/GYN. That follow-up has not occurred. Patient reports that she did start to feel better, but now the pain has returned. Patient reports sharp and stabbing pain in the left lower portion of the abdomen. No associated fever, vomiting, diarrhea.  Patient is a 24 y.o. female presenting with abdominal pain.  Abdominal Pain   Past Medical History  Diagnosis Date  . Right sided abdominal pain   . Scoliosis   . Chlamydia     treated 6/6  . Depression   . Yeast infection 07/03/2012  . Seasonal allergies 07/03/2012  . Vaginal discharge 10/03/2012  . Contraceptive management 01/25/2013  . Threatened abortion in early pregnancy 03/20/2013  . Miscarriage 03/27/2013  . Nexplanon insertion 04/11/2013    nexplanon inserted 04/11/13 remove 3/32/18  . Irregular bleeding 06/03/2013  . Sinus infection 08/27/2013  . Chronic headaches   . Chronic back pain    History reviewed. No pertinent past surgical history. Family History  Problem Relation Age of Onset  . Asthma Mother   . Asthma Sister   . Asthma Brother   . Asthma Daughter   . Diabetes Maternal Grandmother   . Hypertension Maternal Grandmother   . Asthma Maternal Grandmother   . Heart disease Maternal Grandmother   . Stroke Maternal Grandmother   . Hypertension Maternal Grandfather   . Asthma Maternal Grandfather   . Heart disease Maternal Grandfather   . Asthma Son    History   Substance Use Topics  . Smoking status: Never Smoker   . Smokeless tobacco: Never Used  . Alcohol Use: No   OB History    Gravida Para Term Preterm AB TAB SAB Ectopic Multiple Living   4 2 2  0 2 0 2 0 0 2     Review of Systems  Gastrointestinal: Positive for abdominal pain.  Genitourinary: Positive for pelvic pain.  All other systems reviewed and are negative.     Allergies  Review of patient's allergies indicates no known allergies.  Home Medications   Prior to Admission medications   Medication Sig Start Date End Date Taking? Authorizing Provider  etonogestrel (NEXPLANON) 68 MG IMPL implant Inject 1 each into the skin once.    Historical Provider, MD  metroNIDAZOLE (FLAGYL) 500 MG tablet Take 1 tablet (500 mg total) by mouth 2 (two) times daily. 06/15/14   Hope Bunnie Pion, NP   BP 107/61 mmHg  Pulse 89  Temp(Src) 98.9 F (37.2 C) (Oral)  Resp 18  Ht 5\' 2"  (1.575 m)  Wt 141 lb (63.957 kg)  BMI 25.78 kg/m2  SpO2 100%  LMP 05/22/2014 Physical Exam  Constitutional: She is oriented to person, place, and time. She appears well-developed and well-nourished. No distress.  HENT:  Head: Normocephalic and atraumatic.  Right Ear: Hearing normal.  Left Ear: Hearing normal.  Nose: Nose normal.  Mouth/Throat: Oropharynx is clear and moist and mucous membranes are normal.  Eyes: Conjunctivae and EOM are normal. Pupils  are equal, round, and reactive to light.  Neck: Normal range of motion. Neck supple.  Cardiovascular: Regular rhythm, S1 normal and S2 normal.  Exam reveals no gallop and no friction rub.   No murmur heard. Pulmonary/Chest: Effort normal and breath sounds normal. No respiratory distress. She exhibits no tenderness.  Abdominal: Soft. Normal appearance and bowel sounds are normal. There is no hepatosplenomegaly. There is tenderness in the left lower quadrant. There is no rebound, no guarding, no tenderness at McBurney's point and negative Murphy's sign. No hernia.   Musculoskeletal: Normal range of motion.  Neurological: She is alert and oriented to person, place, and time. She has normal strength. No cranial nerve deficit or sensory deficit. Coordination normal. GCS eye subscore is 4. GCS verbal subscore is 5. GCS motor subscore is 6.  Skin: Skin is warm, dry and intact. No rash noted. No cyanosis.  Psychiatric: She has a normal mood and affect. Her speech is normal and behavior is normal. Thought content normal.  Nursing note and vitals reviewed.   ED Course  Procedures (including critical care time) Labs Review Labs Reviewed  BASIC METABOLIC PANEL - Abnormal; Notable for the following:    Calcium 8.8 (*)    All other components within normal limits  URINALYSIS, ROUTINE W REFLEX MICROSCOPIC (NOT AT [facility]) - Abnormal; Notable for the following:    APPearance HAZY (*)    Ketones, ur TRACE (*)    Leukocytes, UA SMALL (*)    All other components within normal limits  URINE MICROSCOPIC-ADD ON - Abnormal; Notable for the following:    Squamous Epithelial / LPF FEW (*)    Bacteria, UA MANY (*)    All other components within normal limits  CBC WITH DIFFERENTIAL/PLATELET  POC URINE PREG, ED    Imaging Review Ct Abdomen Pelvis W Contrast  07/05/2014   CLINICAL DATA:  Left lower quadrant abdominal pain  EXAM: CT ABDOMEN AND PELVIS WITH CONTRAST  TECHNIQUE: Multidetector CT imaging of the abdomen and pelvis was performed using the standard protocol following bolus administration of intravenous contrast.  CONTRAST:  35mL OMNIPAQUE IOHEXOL 300 MG/ML SOLN, 160mL OMNIPAQUE IOHEXOL 300 MG/ML SOLN  COMPARISON:  07/09/2013  FINDINGS: Lower chest:  Lung bases are clear.  Hepatobiliary: 2 mm too small to characterize hypodensity at the dome of the liver is noted. Liver and gallbladder otherwise unremarkable.  Pancreas: Normal  Spleen: Normal  Adrenals/Urinary Tract: Adrenal glands and kidneys appear normal. Bladder is normal. No radiopaque renal or ureteral calculus.   Stomach/Bowel: Stomach appears normal. No bowel wall thickening or focal segmental dilatation is identified. Moderate stool burden. Appendix is normal.  Vascular/Lymphatic: No aortic aneurysm or lymphadenopathy.  Other: No free air or fluid.  Uterus and right ovary appear normal. Within the left adnexa is a tube loop low density oval structure measuring 4.6 cm maximally image 70 which could represent 2 closely apposed ovarian cysts or hydrosalpinx. Trace free fluid in the cul-de-sac.  Musculoskeletal: No acute osseous abnormality.  IMPRESSION: Left adnexal tubular fluid density structure which could represent 2 adjacent ovarian cysts or potentially hydrosalpinx. Because the patient has left lower quadrant pelvic pain, consider pelvic ultrasound, possibly including Doppler, for further evaluation.  No other abnormality to explain the history of abdominal pain.   Electronically Signed   By: Conchita Paris M.D.   On: 07/05/2014 20:01     EKG Interpretation None      MDM   Final diagnoses:  Lower abdominal pain   ovarian  cyst UTI  Resents to the ER for evaluation of left lower abdominal and pelvic pain. Patient reports similar symptoms one month ago. Reviewing of records reveals that she was treated for cervicitis, but ultimately gonorrhea and Chlamydia were negative.  Workup today reveals normal blood work. Urinalysis does, however, suggest bacterial infection. CT scan was performed and does show evidence of ovarian cyst or hydrosalpinx on the left. Patient's symptoms do not support diagnosis of ovarian torsion at this time. Patient is appropriate for outpatient follow-up with her OB/GYN doctor    Orpah Greek, MD 07/05/14 2023

## 2014-07-05 NOTE — Discharge Instructions (Signed)
Abdominal Pain, Women °Abdominal (stomach, pelvic, or belly) pain can be caused by many things. It is important to tell your doctor: °· The location of the pain. °· Does it come and go or is it present all the time? °· Are there things that start the pain (eating certain foods, exercise)? °· Are there other symptoms associated with the pain (fever, nausea, vomiting, diarrhea)? °All of this is helpful to know when trying to find the cause of the pain. °CAUSES  °· Stomach: virus or bacteria infection, or ulcer. °· Intestine: appendicitis (inflamed appendix), regional ileitis (Crohn's disease), ulcerative colitis (inflamed colon), irritable bowel syndrome, diverticulitis (inflamed diverticulum of the colon), or cancer of the stomach or intestine. °· Gallbladder disease or stones in the gallbladder. °· Kidney disease, kidney stones, or infection. °· Pancreas infection or cancer. °· Fibromyalgia (pain disorder). °· Diseases of the female organs: °· Uterus: fibroid (non-cancerous) tumors or infection. °· Fallopian tubes: infection or tubal pregnancy. °· Ovary: cysts or tumors. °· Pelvic adhesions (scar tissue). °· Endometriosis (uterus lining tissue growing in the pelvis and on the pelvic organs). °· Pelvic congestion syndrome (female organs filling up with blood just before the menstrual period). °· Pain with the menstrual period. °· Pain with ovulation (producing an egg). °· Pain with an IUD (intrauterine device, birth control) in the uterus. °· Cancer of the female organs. °· Functional pain (pain not caused by a disease, may improve without treatment). °· Psychological pain. °· Depression. °DIAGNOSIS  °Your doctor will decide the seriousness of your pain by doing an examination. °· Blood tests. °· X-rays. °· Ultrasound. °· CT scan (computed tomography, special type of X-ray). °· MRI (magnetic resonance imaging). °· Cultures, for infection. °· Barium enema (dye inserted in the large intestine, to better view it with  X-rays). °· Colonoscopy (looking in intestine with a lighted tube). °· Laparoscopy (minor surgery, looking in abdomen with a lighted tube). °· Major abdominal exploratory surgery (looking in abdomen with a large incision). °TREATMENT  °The treatment will depend on the cause of the pain.  °· Many cases can be observed and treated at home. °· Over-the-counter medicines recommended by your caregiver. °· Prescription medicine. °· Antibiotics, for infection. °· Birth control pills, for painful periods or for ovulation pain. °· Hormone treatment, for endometriosis. °· Nerve blocking injections. °· Physical therapy. °· Antidepressants. °· Counseling with a psychologist or psychiatrist. °· Minor or major surgery. °HOME CARE INSTRUCTIONS  °· Do not take laxatives, unless directed by your caregiver. °· Take over-the-counter pain medicine only if ordered by your caregiver. Do not take aspirin because it can cause an upset stomach or bleeding. °· Try a clear liquid diet (broth or water) as ordered by your caregiver. Slowly move to a bland diet, as tolerated, if the pain is related to the stomach or intestine. °· Have a thermometer and take your temperature several times a day, and record it. °· Bed rest and sleep, if it helps the pain. °· Avoid sexual intercourse, if it causes pain. °· Avoid stressful situations. °· Keep your follow-up appointments and tests, as your caregiver orders. °· If the pain does not go away with medicine or surgery, you may try: °· Acupuncture. °· Relaxation exercises (yoga, meditation). °· Group therapy. °· Counseling. °SEEK MEDICAL CARE IF:  °· You notice certain foods cause stomach pain. °· Your home care treatment is not helping your pain. °· You need stronger pain medicine. °· You want your IUD removed. °· You feel faint or   not go away with medicine or surgery, you may try:   Acupuncture.   Relaxation exercises (yoga, meditation).   Group therapy.   Counseling.  SEEK MEDICAL CARE IF:    You notice certain foods cause stomach pain.   Your home care treatment is not helping your pain.   You need stronger pain medicine.   You want your IUD removed.   You feel faint or lightheaded.   You develop nausea and vomiting.   You develop a rash.   You are having side effects or an allergy to your medicine.  SEEK IMMEDIATE MEDICAL CARE IF:    Your  pain does not go away or gets worse.   You have a fever.   Your pain is felt only in portions of the abdomen. The right side could possibly be appendicitis. The left lower portion of the abdomen could be colitis or diverticulitis.   You are passing blood in your stools (bright red or black tarry stools, with or without vomiting).   You have blood in your urine.   You develop chills, with or without a fever.   You pass out.  MAKE SURE YOU:    Understand these instructions.   Will watch your condition.   Will get help right away if you are not doing well or get worse.  Document Released: 10/31/2006 Document Revised: 05/20/2013 Document Reviewed: 11/20/2008  [employer] Patient Information 2015 [employer]. This information is not intended to replace advice given to you by your health care provider. Make sure you discuss any questions you have with your health care provider.          Ovarian Cyst  An ovarian cyst is a fluid-filled sac that forms on an ovary. The ovaries are small organs that produce eggs in women. Various types of cysts can form on the ovaries. Most are not cancerous. Many do not cause problems, and they often go away on their own. Some may cause symptoms and require treatment. Common types of ovarian cysts include:   Functional cysts--These cysts may occur every month during the menstrual cycle. This is normal. The cysts usually go away with the next menstrual cycle if the woman does not get pregnant. Usually, there are no symptoms with a functional cyst.   Endometrioma cysts--These cysts form from the tissue that lines the uterus. They are also called "chocolate cysts" because they become filled with blood that turns brown. This type of cyst can cause pain in the lower abdomen during intercourse and with your menstrual period.   Cystadenoma cysts--This type develops from the cells on the outside of the ovary. These cysts can get very big and cause lower abdomen pain and pain with  intercourse. This type of cyst can twist on itself, cut off its blood supply, and cause severe pain. It can also easily rupture and cause a lot of pain.   Dermoid cysts--This type of cyst is sometimes found in both ovaries. These cysts may contain different kinds of body tissue, such as skin, teeth, hair, or cartilage. They usually do not cause symptoms unless they get very big.   Theca lutein cysts--These cysts occur when too much of a certain hormone (human chorionic gonadotropin) is produced and overstimulates the ovaries to produce an egg. This is most common after procedures used to assist with the conception of a baby (in vitro fertilization).  CAUSES    Fertility drugs can cause a condition in which multiple large cysts are formed on the   more about the cyst. These tests may include:  Ultrasound.  X-ray of the pelvis.  CT scan.  MRI.  Blood tests. TREATMENT  Many ovarian cysts go away on their own without treatment. Your health care provider may want to check your cyst regularly for 2-3 months to see if it changes. For women in menopause, it is particularly important to monitor a cyst closely because of the higher rate of ovarian cancer in menopausal women. When treatment is needed, it may include any of the following:  A procedure to  drain the cyst (aspiration). This may be done using a long needle and ultrasound. It can also be done through a laparoscopic procedure. This involves using a thin, lighted tube with a tiny camera on the end (laparoscope) inserted through a small incision.  Surgery to remove the whole cyst. This may be done using laparoscopic surgery or an open surgery involving a larger incision in the lower abdomen.  Hormone treatment or birth control pills. These methods are sometimes used to help dissolve a cyst. HOME CARE INSTRUCTIONS   Only take over-the-counter or prescription medicines as directed by your health care provider.  Follow up with your health care provider as directed.  Get regular pelvic exams and Pap tests. SEEK MEDICAL CARE IF:   Your periods are late, irregular, or painful, or they stop.  Your pelvic pain or abdominal pain does not go away.  Your abdomen becomes larger or swollen.  You have pressure on your bladder or trouble emptying your bladder completely.  You have pain during sexual intercourse.  You have feelings of fullness, pressure, or discomfort in your stomach.  You lose weight for no apparent reason.  You feel generally ill.  You become constipated.  You lose your appetite.  You develop acne.  You have an increase in body and facial hair.  You are gaining weight, without changing your exercise and eating habits.  You think you are pregnant. SEEK IMMEDIATE MEDICAL CARE IF:   You have increasing abdominal pain.  You feel sick to your stomach (nauseous), and you throw up (vomit).  You develop a fever that comes on suddenly.  You have abdominal pain during a bowel movement.  Your menstrual periods become heavier than usual. MAKE SURE YOU:  Understand these instructions.  Will watch your condition.  Will get help right away if you are not doing well or get worse. Document Released: 01/03/2005 Document Revised: 01/08/2013 Document Reviewed:  09/10/2012 [facility] Patient Information 2015 [place], [place]. This information is not intended to replace advice given to you by your health care provider. Make sure you discuss any questions you have with your health care provider.  Urinary Tract Infection Urinary tract infections (UTIs) can develop anywhere along your urinary tract. Your urinary tract is your body's drainage system for removing wastes and extra water. Your urinary tract includes two kidneys, two ureters, a bladder, and a urethra. Your kidneys are a pair of bean-shaped organs. Each kidney is about the size of your fist. They are located below your ribs, one on each side of your spine. CAUSES Infections are caused by microbes, which are microscopic organisms, including fungi, viruses, and bacteria. These organisms are so small that they can only be seen through a microscope. Bacteria are the microbes that most commonly cause UTIs. SYMPTOMS  Symptoms of UTIs may vary by age and gender of the patient and by the location of the infection. Symptoms in young women typically include a frequent and intense  urge to urinate and a painful, burning feeling in the bladder or urethra during urination. Older women and men are more likely to be tired, shaky, and weak and have muscle aches and abdominal pain. A fever may mean the infection is in your kidneys. Other symptoms of a kidney infection include pain in your back or sides below the ribs, nausea, and vomiting. DIAGNOSIS To diagnose a UTI, your caregiver will ask you about your symptoms. Your caregiver also will ask to provide a urine sample. The urine sample will be tested for bacteria and white blood cells. White blood cells are made by your body to help fight infection. TREATMENT  Typically, UTIs can be treated with medication. Because most UTIs are caused by a bacterial infection, they usually can be treated with the use of antibiotics. The choice of antibiotic and length of treatment depend  on your symptoms and the type of bacteria causing your infection. HOME CARE INSTRUCTIONS  If you were prescribed antibiotics, take them exactly as your caregiver instructs you. Finish the medication even if you feel better after you have only taken some of the medication.  Drink enough water and fluids to keep your urine clear or pale yellow.  Avoid caffeine, tea, and carbonated beverages. They tend to irritate your bladder.  Empty your bladder often. Avoid holding urine for long periods of time.  Empty your bladder before and after sexual intercourse.  After a bowel movement, women should cleanse from front to back. Use each tissue only once. SEEK MEDICAL CARE IF:   You have back pain.  You develop a fever.  Your symptoms do not begin to resolve within 3 days. SEEK IMMEDIATE MEDICAL CARE IF:   You have severe back pain or lower abdominal pain.  You develop chills.  You have nausea or vomiting.  You have continued burning or discomfort with urination. MAKE SURE YOU:   Understand these instructions.  Will watch your condition.  Will get help right away if you are not doing well or get worse. Document Released: 10/13/2004 Document Revised: 07/05/2011 Document Reviewed: 02/11/2011 [facility] Patient Information 2015 [place], [place]. This information is not intended to replace advice given to you by your health care provider. Make sure you discuss any questions you have with your health care provider.

## 2014-07-05 NOTE — ED Notes (Signed)
Patient c/o left lower abd pain with swelling in abd. Denies any nausea, vomiting, diarrhea, fevers, or urinary symptoms. Per patient was here approx a month ago with same symptoms and diagnosed with a "bacterial infection" in which she received antibiotics. Patient states pain is worse this time.

## 2014-07-08 ENCOUNTER — Ambulatory Visit: Payer: Medicaid Other | Admitting: Obstetrics & Gynecology

## 2014-07-08 ENCOUNTER — Ambulatory Visit (INDEPENDENT_AMBULATORY_CARE_PROVIDER_SITE_OTHER): Payer: Self-pay | Admitting: Obstetrics & Gynecology

## 2014-07-08 ENCOUNTER — Encounter: Payer: Self-pay | Admitting: Obstetrics & Gynecology

## 2014-07-08 VITALS — BP 102/70 | HR 72 | Wt 145.0 lb

## 2014-07-08 DIAGNOSIS — N8320 Unspecified ovarian cysts: Secondary | ICD-10-CM

## 2014-07-08 DIAGNOSIS — N83202 Unspecified ovarian cyst, left side: Secondary | ICD-10-CM

## 2014-07-08 NOTE — Progress Notes (Signed)
Patient ID: Deanna Hughes, female   DOB: Aug 22, 1990, 24 y.o.   MRN: [id number] Pt seen from ED  Had CT scan which revealed either an hydrosalopinx or 2 adjacent ovarian cyst Viewing the CT scan think it most likely is ovarian cyst  Patient pain is stable  We will see her back in 6 weeks for follow-up scan for reevaluation     Face to face time:  10 minutes  Greater than 50% of the visit time was spent in counseling and coordination of care with the patient.  The summary and outline of the counseling and care coordination is summarized in the note above.   All questions were answered.

## 2014-08-18 ENCOUNTER — Other Ambulatory Visit: Payer: Self-pay | Admitting: Obstetrics & Gynecology

## 2014-08-18 DIAGNOSIS — N83202 Unspecified ovarian cyst, left side: Secondary | ICD-10-CM

## 2014-08-19 ENCOUNTER — Other Ambulatory Visit: Payer: Medicaid Other

## 2014-08-26 ENCOUNTER — Ambulatory Visit: Payer: Medicaid Other | Admitting: Obstetrics & Gynecology

## 2014-09-30 ENCOUNTER — Emergency Department (HOSPITAL_COMMUNITY)
Admission: EM | Admit: 2014-09-30 | Discharge: 2014-09-30 | Disposition: A | Discharge status: Home / Self Care | Payer: Medicaid Other | Attending: Emergency Medicine | Admitting: Emergency Medicine

## 2014-09-30 ENCOUNTER — Encounter (HOSPITAL_COMMUNITY): Payer: Self-pay | Admitting: Emergency Medicine

## 2014-09-30 DIAGNOSIS — G8929 Other chronic pain: Secondary | ICD-10-CM | POA: Insufficient documentation

## 2014-09-30 DIAGNOSIS — R21 Rash and other nonspecific skin eruption: Secondary | ICD-10-CM

## 2014-09-30 DIAGNOSIS — Z8742 Personal history of other diseases of the female genital tract: Secondary | ICD-10-CM | POA: Insufficient documentation

## 2014-09-30 DIAGNOSIS — Z8619 Personal history of other infectious and parasitic diseases: Secondary | ICD-10-CM | POA: Insufficient documentation

## 2014-09-30 DIAGNOSIS — Z8659 Personal history of other mental and behavioral disorders: Secondary | ICD-10-CM | POA: Insufficient documentation

## 2014-09-30 MED ORDER — TRIAMCINOLONE ACETONIDE 0.1 % EX CREA: 1.0000 "application " | TOPICAL_CREAM | Freq: Two times a day (BID) | CUTANEOUS | Status: DC

## 2014-09-30 NOTE — ED Notes (Signed)
Pt reports red bumps on left forearm, right face, right elbow, and right hand.  Swollen areas are noted that itch and burn. Pt reports switching to tide laundry detergent 2 weeks ago, but is not confident that it has caused this issue.  Pt denies being around others who have had similar symptoms.

## 2014-09-30 NOTE — Discharge Instructions (Signed)
As discussed, your rash looks most like insect bites such as mosquito bites, but could also be a localized reaction to an irritant.  Use the steroid cream as prescribed.  Use very sparingly and not more than 7 days (especially on your face).  You may take benadryl (either capsules or topical) if needed for itching.  [place] anti itch cream is also very good for itch relief.   Rash A rash is a change in the color or texture of your skin. There are many different types of rashes. You may have other problems that accompany your rash. CAUSES   Infections.  Allergic reactions. This can include allergies to pets or foods.  Certain medicines.  Exposure to certain chemicals, soaps, or cosmetics.  Heat.  Exposure to poisonous plants.  Tumors, both cancerous and noncancerous. SYMPTOMS   Redness.  Scaly skin.  Itchy skin.  Dry or cracked skin.  Bumps.  Blisters.  Pain. DIAGNOSIS  Your caregiver may do a physical exam to determine what type of rash you have. A skin sample (biopsy) may be taken and examined under a microscope. TREATMENT  Treatment depends on the type of rash you have. Your caregiver may prescribe certain medicines. For serious conditions, you may need to see a skin doctor (dermatologist). HOME CARE INSTRUCTIONS   Avoid the substance that caused your rash.  Do not scratch your rash. This can cause infection.  You may take cool baths to help stop itching.  Only take over-the-counter or prescription medicines as directed by your caregiver.  Keep all follow-up appointments as directed by your caregiver. SEEK IMMEDIATE MEDICAL CARE IF:  You have increasing pain, swelling, or redness.  You have a fever.  You have new or severe symptoms.  You have body aches, diarrhea, or vomiting.  Your rash is not better after 3 days. MAKE SURE YOU:  Understand these instructions.  Will watch your condition.  Will get help right away if you are not doing well or get  worse. Document Released: 12/24/2001 Document Revised: 03/28/2011 Document Reviewed: 10/18/2010 [facility] Patient Information 2015 [place], [place]. This information is not intended to replace advice given to you by your health care provider. Make sure you discuss any questions you have with your health care provider.

## 2014-09-30 NOTE — ED Notes (Signed)
Pt made aware to return if symptoms worsen or if any life threatening symptoms occur.   

## 2014-10-01 NOTE — ED Provider Notes (Signed)
CSN: [id number]     Arrival date & time 09/30/14  0902 History   First MD Initiated Contact with Patient 09/30/14 0912     Chief Complaint  Patient presents with  . Rash     (Consider location/radiation/quality/duration/timing/severity/associated sxs/prior Treatment) The history is provided by the patient.   Deanna Hughes is a 24 y.o. female presenting with a pruritic rash starting approximately 1 week ago and is localized to her bilateral forearms hands and right cheek.  She has had no drainage from the sites.  She denies fevers or chills, sore throat or recent URI type symptoms.  She has switched her laundry detergent 2 weeks ago but recognizes no other changes in her environment.  She has had no medications for this rash prior to arrival.  No other household members or friends with similar symptoms.    Past Medical History  Diagnosis Date  . Right sided abdominal pain   . Scoliosis   . Chlamydia     treated 6/6  . Depression   . Yeast infection 07/03/2012  . Seasonal allergies 07/03/2012  . Vaginal discharge 10/03/2012  . Contraceptive management 01/25/2013  . Threatened abortion in early pregnancy 03/20/2013  . Miscarriage 03/27/2013  . Nexplanon insertion 04/11/2013    nexplanon inserted 04/11/13 remove 3/32/18  . Irregular bleeding 06/03/2013  . Sinus infection 08/27/2013  . Chronic headaches   . Chronic back pain    History reviewed. No pertinent past surgical history. Family History  Problem Relation Age of Onset  . Asthma Mother   . Asthma Sister   . Asthma Brother   . Asthma Daughter   . Diabetes Maternal Grandmother   . Hypertension Maternal Grandmother   . Asthma Maternal Grandmother   . Heart disease Maternal Grandmother   . Stroke Maternal Grandmother   . Hypertension Maternal Grandfather   . Asthma Maternal Grandfather   . Heart disease Maternal Grandfather   . Asthma Son    Social History  Substance Use Topics  . Smoking status: Never Smoker   .  Smokeless tobacco: Never Used  . Alcohol Use: No   OB History    Gravida Para Term Preterm AB TAB SAB Ectopic Multiple Living   4 2 2  0 2 0 2 0 0 2     Review of Systems  Constitutional: Negative for fever and chills.  Respiratory: Negative for shortness of breath and wheezing.   Skin: Positive for rash.  Neurological: Negative for numbness.      Allergies  Review of patient's allergies indicates no known allergies.  Home Medications   Prior to Admission medications   Medication Sig Start Date End Date Taking? Authorizing Provider  etonogestrel (NEXPLANON) 68 MG IMPL implant Inject 1 each into the skin once.    Historical Provider, MD  HYDROcodone-acetaminophen (NORCO/VICODIN) 5-325 MG per tablet Take 2 tablets by mouth every 4 (four) hours as needed for moderate pain. Patient not taking: Reported on 07/08/2014 07/05/14   Orpah Greek, MD  ibuprofen (ADVIL,MOTRIN) 800 MG tablet Take 1 tablet (800 mg total) by mouth 3 (three) times daily. Patient not taking: Reported on 07/08/2014 07/05/14   Orpah Greek, MD  metroNIDAZOLE (FLAGYL) 500 MG tablet Take 1 tablet (500 mg total) by mouth 2 (two) times daily. Patient not taking: Reported on 07/08/2014 06/15/14   Ashley Murrain, NP  nitrofurantoin, macrocrystal-monohydrate, (MACROBID) 100 MG capsule Take 1 capsule (100 mg total) by mouth 2 (two) times daily. X 7 days Patient  not taking: Reported on 07/08/2014 07/05/14   Orpah Greek, MD  triamcinolone cream (KENALOG) 0.1 % Apply 1 application topically 2 (two) times daily. 09/30/14   Evalee Jefferson, PA-C   BP 101/60 mmHg  Pulse 80  Temp(Src) 98.5 F (36.9 C) (Oral)  Resp 16  Ht 5\' 2"  (1.575 m)  Wt 130 lb (58.968 kg)  BMI 23.77 kg/m2  SpO2 100% Physical Exam  Constitutional: She appears well-developed and well-nourished. No distress.  HENT:  Head: Normocephalic.  Neck: Neck supple.  Cardiovascular: Normal rate.   Pulmonary/Chest: Effort normal. She has no wheezes.   Musculoskeletal: Normal range of motion. She exhibits no edema.  Skin: Rash noted.  Multiple scattered small raised papules most prominent on her right dorsal hand a few on bilateral forearms.  One on her right cheek.  There are no vesicles, there are no pustules or drainage.  No surrounding erythema.      ED Course  Procedures (including critical care time) Labs Review Labs Reviewed - No data to display  Imaging Review No results found. I have personally reviewed and evaluated these images and lab results as part of my medical decision-making.   EKG Interpretation None      MDM   Final diagnoses:  Rash and nonspecific skin eruption    Rash most consistent with insect bites, really suggesting mosquito bites.  However given recent detergent change, she was prescribed Kenalog cream for topical use twice a day.  Also advised Benadryl for itch relief as needed.  Plan follow-up with PCP if symptoms persist or worsen.  The patient appears reasonably screened and/or stabilized for discharge and I doubt any other medical condition or other [facility] requiring further screening, evaluation, or treatment in the ED at this time prior to discharge.     Evalee Jefferson, PA-C 10/01/14 [place], MD 10/05/14 [phone]

## 2015-01-26 ENCOUNTER — Telehealth: Payer: Self-pay | Admitting: *Deleted

## 2015-01-26 MED ORDER — MEGESTROL ACETATE 40 MG PO TABS: ORAL_TABLET | ORAL | Status: DC

## 2015-01-26 NOTE — Addendum Note (Signed)
Addended by: Derrek Monaco A on: 01/26/2015 01:35 PM   Modules accepted: Orders

## 2015-01-26 NOTE — Telephone Encounter (Signed)
Left message I called 

## 2015-01-26 NOTE — Telephone Encounter (Signed)
Having bleeding with nexplanon, no sex lately not worried about STD, requests megace, will rx, also has headache at times, if not better when bleeding stops make appt to be seen

## 2015-02-17 ENCOUNTER — Encounter: Payer: Medicaid Other | Admitting: Women's Health

## 2015-02-20 ENCOUNTER — Encounter: Payer: Medicaid Other | Admitting: Adult Health

## 2015-02-23 ENCOUNTER — Ambulatory Visit (INDEPENDENT_AMBULATORY_CARE_PROVIDER_SITE_OTHER): Payer: BLUE CROSS/BLUE SHIELD | Admitting: Adult Health

## 2015-02-23 ENCOUNTER — Encounter: Payer: Self-pay | Admitting: Adult Health

## 2015-02-23 VITALS — BP 100/72 | HR 78 | Ht 62.0 in | Wt 147.0 lb

## 2015-02-23 DIAGNOSIS — Z3009 Encounter for other general counseling and advice on contraception: Secondary | ICD-10-CM

## 2015-02-23 DIAGNOSIS — Z01419 Encounter for gynecological examination (general) (routine) without abnormal findings: Secondary | ICD-10-CM | POA: Diagnosis not present

## 2015-02-23 DIAGNOSIS — Z975 Presence of (intrauterine) contraceptive device: Secondary | ICD-10-CM

## 2015-02-23 DIAGNOSIS — N926 Irregular menstruation, unspecified: Secondary | ICD-10-CM

## 2015-02-23 NOTE — Progress Notes (Signed)
Patient ID: Deanna Hughes, female   DOB: Jun 27, 1990, 25 y.o.   MRN: [id number] History of Present Illness: Deanna Hughes is a 25 year old black female, G4P2 in for a well woman gyn exam,she had a normal pap 02/06/14.She wants her nexplanon removed due to irregular bleeding.   Current Medications, Allergies, Past Medical History, Past Surgical History, Family History and Social History were reviewed in [employer] record.     Review of Systems: Patient denies any headaches, hearing loss, fatigue, blurred vision, shortness of breath, chest pain, abdominal pain, problems with bowel movements, urination, or intercourse. No joint pain or mood swings.See HPI for positives.    Physical Exam:BP 100/72 mmHg  Pulse 78  Ht 5\' 2"  (1.575 m)  Wt 147 lb (66.679 kg)  BMI 26.88 kg/m2  LMP 01/16/2015 General:  Well developed, well nourished, no acute distress Skin:  Warm and dry Neck:  Midline trachea, normal thyroid, good ROM, no lymphadenopathy Lungs; Clear to auscultation bilaterally Breast:  No dominant palpable mass, retraction, or nipple discharge Cardiovascular: Regular rate and rhythm Abdomen:  Soft, non tender, no hepatosplenomegaly Pelvic:  External genitalia is normal in appearance, no lesions.  The vagina is normal in appearance. Urethra has no lesions or masses. The cervix is bulbous, GC/CHL obtained.  Uterus is felt to be normal size, shape, and contour.  No adnexal masses or tenderness noted.Bladder is non tender, no masses felt. Extremities/musculoskeletal:  No swelling or varicosities noted, no clubbing or cyanosis Psych:  No mood changes, alert and cooperative,seems happy   Impression: Well woman gyn exam no pap Family planning Irregular bleeding nexplanon in place    Plan: Use condoms Return in 4 days for nexplanon removal Check HIV,RPR and HSV 2  GC/CHL sent Physical in 1 year

## 2015-02-23 NOTE — Patient Instructions (Signed)
Use condoms Return in 4 days for nexplanon removal Physical in 1 year

## 2015-02-24 LAB — HSV 2 ANTIBODY, IGG: HSV 2 Glycoprotein G Ab, IgG: <0.91 index (ref 0.00–0.90)

## 2015-02-24 LAB — RPR: RPR Ser Ql: NONREACTIVE

## 2015-02-24 LAB — HIV ANTIBODY (ROUTINE TESTING W REFLEX): HIV Screen 4th Generation wRfx: NONREACTIVE

## 2015-02-25 LAB — GC/CHLAMYDIA PROBE AMP
Chlamydia trachomatis, NAA: NEGATIVE
Neisseria gonorrhoeae by PCR: NEGATIVE

## 2015-02-27 ENCOUNTER — Encounter: Payer: Self-pay | Admitting: Adult Health

## 2015-02-27 ENCOUNTER — Ambulatory Visit: Payer: BLUE CROSS/BLUE SHIELD | Admitting: Adult Health

## 2015-02-27 ENCOUNTER — Encounter (HOSPITAL_COMMUNITY): Payer: Self-pay | Admitting: Emergency Medicine

## 2015-02-27 ENCOUNTER — Emergency Department (HOSPITAL_COMMUNITY)
Admission: EM | Admit: 2015-02-27 | Discharge: 2015-02-27 | Disposition: A | Discharge status: Home / Self Care | Payer: BLUE CROSS/BLUE SHIELD | Attending: Emergency Medicine | Admitting: Emergency Medicine

## 2015-02-27 VITALS — BP 120/78 | HR 82 | Ht 62.0 in | Wt 150.0 lb

## 2015-02-27 DIAGNOSIS — Z8659 Personal history of other mental and behavioral disorders: Secondary | ICD-10-CM | POA: Diagnosis not present

## 2015-02-27 DIAGNOSIS — G8929 Other chronic pain: Secondary | ICD-10-CM | POA: Diagnosis not present

## 2015-02-27 DIAGNOSIS — Z8619 Personal history of other infectious and parasitic diseases: Secondary | ICD-10-CM | POA: Diagnosis not present

## 2015-02-27 DIAGNOSIS — Z8709 Personal history of other diseases of the respiratory system: Secondary | ICD-10-CM | POA: Insufficient documentation

## 2015-02-27 DIAGNOSIS — Z8742 Personal history of other diseases of the female genital tract: Secondary | ICD-10-CM | POA: Diagnosis not present

## 2015-02-27 DIAGNOSIS — Z8739 Personal history of other diseases of the musculoskeletal system and connective tissue: Secondary | ICD-10-CM | POA: Diagnosis not present

## 2015-02-27 DIAGNOSIS — Z79818 Long term (current) use of other agents affecting estrogen receptors and estrogen levels: Secondary | ICD-10-CM | POA: Diagnosis not present

## 2015-02-27 DIAGNOSIS — J069 Acute upper respiratory infection, unspecified: Secondary | ICD-10-CM | POA: Insufficient documentation

## 2015-02-27 DIAGNOSIS — Z3046 Encounter for surveillance of implantable subdermal contraceptive: Secondary | ICD-10-CM

## 2015-02-27 DIAGNOSIS — R05 Cough: Secondary | ICD-10-CM | POA: Diagnosis present

## 2015-02-27 HISTORY — DX: Encounter for surveillance of implantable subdermal contraceptive: Z30.46

## 2015-02-27 MED ORDER — HYDROCODONE-HOMATROPINE 5-1.5 MG/5ML PO SYRP: 5.0000 mL | ORAL_SOLUTION | Freq: Four times a day (QID) | ORAL | Status: DC | PRN

## 2015-02-27 MED ORDER — LORATADINE-PSEUDOEPHEDRINE ER 5-120 MG PO TB12: 1.0000 | ORAL_TABLET | Freq: Two times a day (BID) | ORAL | Status: DC

## 2015-02-27 NOTE — Progress Notes (Signed)
Subjective:     Patient ID: Deanna Hughes, female   DOB: 02-Jul-1990, 25 y.o.   MRN: [id number]  HPI Deanna Hughes is a 25 year old black female, in for nexplanon removal.   Review of Systems For nexplanon remvoal Patient denies any headaches, hearing loss, fatigue, blurred vision, shortness of breath, chest pain, abdominal pain, problems with bowel movements, urination, or intercourse. No joint pain or mood swings. Reviewed past medical,surgical, social and family history. Reviewed medications and allergies.     Objective:   Physical Exam BP 120/78 mmHg  Pulse 82  Ht 5\' 2"  (1.575 m)  Wt 150 lb (68.04 kg)  BMI 27.43 kg/m2  LMP 01/16/2015 Consent signed, time out called, left arm cleansed with betadine, and injected with 1.5 cc 2% lidocaine and waited til numb.Under sterile technique a #11 blade was used to make small vertical incision, and a curved forceps was used to easily remove rod. Steri strips applied. Pressure dressing applied.    Assessment:     Nexplanon removal     Plan:     Use condoms, keep clean and dry x 24 hours, no heavy lifting, keep steri strips on x 72 hours, Keep pressure dressing on x 24 hours. Follow up prn problems.

## 2015-02-27 NOTE — Discharge Instructions (Signed)

## 2015-02-27 NOTE — ED Provider Notes (Signed)
CSN: [id number]     Arrival date & time 02/27/15  1405 History   First MD Initiated Contact with Patient 02/27/15 1448     Chief Complaint  Patient presents with  . Cough  . Sore Throat  . Generalized Body Aches     (Consider location/radiation/quality/duration/timing/severity/associated sxs/prior Treatment) Patient is a 25 y.o. female presenting with cough, pharyngitis, and URI. The history is provided by the patient.  Cough Associated symptoms: headaches, myalgias and sore throat   Sore Throat Associated symptoms include coughing, headaches, myalgias and a sore throat.  URI Presenting symptoms: cough and sore throat   Severity:  Moderate Onset quality:  Gradual Duration:  2 days Timing:  Intermittent Progression:  Worsening Chronicity:  New Relieved by:  Nothing Worsened by:  Nothing tried Associated symptoms: headaches and myalgias   Risk factors: sick contacts   Risk factors: no diabetes mellitus     Past Medical History  Diagnosis Date  . Right sided abdominal pain   . Scoliosis   . Chlamydia     treated 6/6  . Depression   . Yeast infection 07/03/2012  . Seasonal allergies 07/03/2012  . Vaginal discharge 10/03/2012  . Contraceptive management 01/25/2013  . Threatened abortion in early pregnancy 03/20/2013  . Miscarriage 03/27/2013  . Nexplanon insertion 04/11/2013    nexplanon inserted 04/11/13 remove 3/32/18  . Irregular bleeding 06/03/2013  . Sinus infection 08/27/2013  . Chronic headaches   . Chronic back pain   . Encounter for Nexplanon removal 02/27/2015   History reviewed. No pertinent past surgical history. Family History  Problem Relation Age of Onset  . Asthma Mother   . Asthma Sister   . Asthma Brother   . Asthma Daughter   . Diabetes Maternal Grandmother   . Hypertension Maternal Grandmother   . Asthma Maternal Grandmother   . Heart disease Maternal Grandmother   . Stroke Maternal Grandmother   . Hypertension Maternal Grandfather   . Asthma  Maternal Grandfather   . Heart disease Maternal Grandfather   . Asthma Son    Social History  Substance Use Topics  . Smoking status: Never Smoker   . Smokeless tobacco: Never Used  . Alcohol Use: No   OB History    Gravida Para Term Preterm AB TAB SAB Ectopic Multiple Living   4 2 2  0 2 0 2 0 0 2     Review of Systems  HENT: Positive for sore throat.   Respiratory: Positive for cough.   Musculoskeletal: Positive for myalgias.  Neurological: Positive for headaches.  All other systems reviewed and are negative.     Allergies  Review of patient's allergies indicates no known allergies.  Home Medications   Prior to Admission medications   Medication Sig Start Date End Date Taking? Authorizing Provider  acetaminophen (TYLENOL) 325 MG tablet Take 325 mg by mouth every 6 (six) hours as needed.    Historical Provider, MD  megestrol (MEGACE) 40 MG tablet Take 3 x 5 days then 2 x 5 days then 1 daily 01/26/15   Estill Dooms, NP   BP 111/71 mmHg  Pulse 89  Temp(Src) 98 F (36.7 C) (Oral)  Resp 16  Ht 5\' 2"  (1.575 m)  Wt 68.04 kg  BMI 27.43 kg/m2  SpO2 100%  LMP 01/16/2015 Physical Exam  Constitutional: She is oriented to person, place, and time. She appears well-developed and well-nourished.  Non-toxic appearance.  HENT:  Head: Normocephalic.  Right Ear: Tympanic membrane and external ear  normal.  Left Ear: Tympanic membrane and external ear normal.  Nasal congestion.  Eyes: EOM and lids are normal. Pupils are equal, round, and reactive to light.  Neck: Normal range of motion. Neck supple. Carotid bruit is not present.  Cardiovascular: Normal rate, regular rhythm, normal heart sounds, intact distal pulses and normal pulses.   Pulmonary/Chest: Breath sounds normal. No respiratory distress. She has no wheezes.  Course breath sounds. Symmetrical rise and fall of the chest.  Abdominal: Soft. Bowel sounds are normal. There is no tenderness. There is no guarding.   Musculoskeletal: Normal range of motion.  Lymphadenopathy:       Head (right side): No submandibular adenopathy present.       Head (left side): No submandibular adenopathy present.    She has no cervical adenopathy.  Neurological: She is alert and oriented to person, place, and time. She has normal strength. No cranial nerve deficit or sensory deficit.  Skin: Skin is warm and dry.  Psychiatric: She has a normal mood and affect. Her speech is normal.  Nursing note and vitals reviewed.   ED Course  Procedures (including critical care time) Labs Review Labs Reviewed - No data to display  Imaging Review No results found. I have personally reviewed and evaluated these images and lab results as part of my medical decision-making.   EKG Interpretation None      MDM  Vital signs reviewed. The examination favors upper respiratory infection. I have discussed with the patient to use salt water gargles for her throat. Claritin-D for the nasal congestion, and Hycodan for cough. We also discussed the importance of using a mask until symptoms have resolved.    Final diagnoses:  None    *I have reviewed nursing notes, vital signs, and all appropriate lab and imaging results for this patient.[id number] PA-C 02/27/15 1502  Virgel Manifold, MD 03/03/15 [phone]

## 2015-02-27 NOTE — Patient Instructions (Signed)
Use condoms, keep clean and dry x 24 hours, no heavy lifting, keep steri strips on x 72 hours, Keep pressure dressing on x 24 hours. Follow up prn problems.  

## 2015-02-27 NOTE — ED Notes (Signed)
Patient complaining of cough, sore throat, and body aches x 2 days.

## 2015-03-02 ENCOUNTER — Telehealth: Payer: Self-pay | Admitting: *Deleted

## 2015-03-02 NOTE — Telephone Encounter (Signed)
Spoke with pt. Pt states she has a blister close to the area of where the steri strip was. Got Nexplanon removed Friday. JAG advised that can be normal. Advised to put band aid on it and don't pop blister. Pt states incision is still open. Pt to come by and have another steri strip placed. Pt voiced understanding. [place]

## 2015-03-11 ENCOUNTER — Encounter (HOSPITAL_COMMUNITY): Payer: Self-pay | Admitting: Emergency Medicine

## 2015-03-11 ENCOUNTER — Emergency Department (HOSPITAL_COMMUNITY): Payer: BLUE CROSS/BLUE SHIELD

## 2015-03-11 ENCOUNTER — Emergency Department (HOSPITAL_COMMUNITY)
Admission: EM | Admit: 2015-03-11 | Discharge: 2015-03-11 | Disposition: A | Discharge status: Home / Self Care | Payer: BLUE CROSS/BLUE SHIELD | Attending: Emergency Medicine | Admitting: Emergency Medicine

## 2015-03-11 DIAGNOSIS — Z79899 Other long term (current) drug therapy: Secondary | ICD-10-CM | POA: Insufficient documentation

## 2015-03-11 DIAGNOSIS — F329 Major depressive disorder, single episode, unspecified: Secondary | ICD-10-CM | POA: Diagnosis not present

## 2015-03-11 DIAGNOSIS — J069 Acute upper respiratory infection, unspecified: Secondary | ICD-10-CM | POA: Insufficient documentation

## 2015-03-11 DIAGNOSIS — B9789 Other viral agents as the cause of diseases classified elsewhere: Secondary | ICD-10-CM

## 2015-03-11 DIAGNOSIS — R05 Cough: Secondary | ICD-10-CM | POA: Diagnosis present

## 2015-03-11 LAB — POC URINE PREG, ED: Preg Test, Ur: NEGATIVE

## 2015-03-11 MED ORDER — PROMETHAZINE-CODEINE 6.25-10 MG/5ML PO SYRP: 5.0000 mL | ORAL_SOLUTION | ORAL | Status: DC | PRN

## 2015-03-11 MED ORDER — PROMETHAZINE-CODEINE 6.25-10 MG/5ML PO SYRP
5.0000 mL | ORAL_SOLUTION | Freq: Once | ORAL | Status: AC
  Administered 2015-03-11: 5 mL via ORAL
  Filled 2015-03-11: qty 5

## 2015-03-11 NOTE — ED Provider Notes (Signed)
CSN: [id number]     Arrival date & time 03/11/15  [id number] History   First MD Initiated Contact with Patient 03/11/15 [phone]     Chief Complaint  Patient presents with  . Cough     (Consider location/radiation/quality/duration/timing/severity/associated sxs/prior Treatment) The history is provided by the patient.   Deanna Hughes is a 25 y.o. female presenting with a 3 week history of uri type symptoms which includes nasal congestion with clear rhinorrhea, mild sore throat which is improving, fever to 101 (3 days ago) and cough occasionally productive of yellow sputum.  Symptoms do not include shortness of breath, chest pain,   vomiting or diarrhea but does endorse mild intermittent nausea, wore during cough spells.  The patient has taken mucinex, Tylenol cold and claritin D which she was placed on at her last visit here for these symptoms with no significant improvement in symptoms.     Past Medical History  Diagnosis Date  . Right sided abdominal pain   . Scoliosis   . Chlamydia     treated 6/6  . Depression   . Yeast infection 07/03/2012  . Seasonal allergies 07/03/2012  . Vaginal discharge 10/03/2012  . Contraceptive management 01/25/2013  . Threatened abortion in early pregnancy 03/20/2013  . Miscarriage 03/27/2013  . Nexplanon insertion 04/11/2013    nexplanon inserted 04/11/13 remove 3/32/18  . Irregular bleeding 06/03/2013  . Sinus infection 08/27/2013  . Chronic headaches   . Chronic back pain   . Encounter for Nexplanon removal 02/27/2015   History reviewed. No pertinent past surgical history. Family History  Problem Relation Age of Onset  . Asthma Mother   . Asthma Sister   . Asthma Brother   . Asthma Daughter   . Diabetes Maternal Grandmother   . Hypertension Maternal Grandmother   . Asthma Maternal Grandmother   . Heart disease Maternal Grandmother   . Stroke Maternal Grandmother   . Hypertension Maternal Grandfather   . Asthma Maternal Grandfather   . Heart disease  Maternal Grandfather   . Asthma Son    Social History  Substance Use Topics  . Smoking status: Never Smoker   . Smokeless tobacco: Never Used  . Alcohol Use: No   OB History    Gravida Para Term Preterm AB TAB SAB Ectopic Multiple Living   4 2 2  0 2 0 2 0 0 2     Review of Systems  Constitutional: Positive for fever.  HENT: Positive for congestion, rhinorrhea and sore throat. Negative for ear pain, facial swelling, sinus pressure, trouble swallowing and voice change.   Eyes: Negative for discharge.  Respiratory: Positive for cough. Negative for shortness of breath, wheezing and stridor.   Gastrointestinal: Positive for nausea. Negative for vomiting, abdominal pain, diarrhea and constipation.  Genitourinary: Negative.   Musculoskeletal: Negative for neck pain and neck stiffness.  Skin: Negative for rash.      Allergies  Review of patient's allergies indicates no known allergies.  Home Medications   Prior to Admission medications   Medication Sig Start Date End Date Taking? Authorizing Provider  acetaminophen (TYLENOL) 325 MG tablet Take 325 mg by mouth every 6 (six) hours as needed.    Historical Provider, MD  HYDROcodone-homatropine (HYCODAN) 5-1.5 MG/5ML syrup Take 5 mLs by mouth every 6 (six) hours as needed. Patient not taking: Reported on 03/11/2015 02/27/15   Lily Kocher, PA-C  loratadine-pseudoephedrine (CLARITIN-D 12 HOUR) 5-120 MG tablet Take 1 tablet by mouth 2 (two) times daily. Patient not taking:  Reported on 03/11/2015 02/27/15   Lily Kocher, PA-C  megestrol (MEGACE) 40 MG tablet Take 3 x 5 days then 2 x 5 days then 1 daily 01/26/15   Estill Dooms, NP  promethazine-codeine [facility] WITH CODEINE) 6.25-10 MG/5ML syrup Take 5 mLs by mouth every 4 (four) hours as needed for cough. 03/11/15   Evalee Jefferson, PA-C   BP 124/77 mmHg  Pulse 87  Temp(Src) 98.2 F (36.8 C)  Resp 18  Ht 5\' 2"  (1.575 m)  Wt 63.504 kg  BMI 25.60 kg/m2  SpO2 100%  LMP  01/16/2015 Physical Exam  Constitutional: She is oriented to person, place, and time. She appears well-developed and well-nourished.  HENT:  Head: Normocephalic and atraumatic.  Right Ear: Tympanic membrane and ear canal normal.  Left Ear: Tympanic membrane and ear canal normal.  Nose: Mucosal edema and rhinorrhea present.  Mouth/Throat: Uvula is midline, oropharynx is clear and moist and mucous membranes are normal. No oropharyngeal exudate, posterior oropharyngeal edema, posterior oropharyngeal erythema or tonsillar abscesses.  Eyes: Conjunctivae are normal.  Cardiovascular: Normal rate and normal heart sounds.   Pulmonary/Chest: Effort normal. No respiratory distress. She has no wheezes. She has no rales.  Abdominal: Soft. There is no tenderness.  Musculoskeletal: Normal range of motion.  Neurological: She is alert and oriented to person, place, and time.  Skin: Skin is warm and dry. No rash noted.  Psychiatric: She has a normal mood and affect.    ED Course  Procedures (including critical care time) Labs Review Labs Reviewed  POC URINE PREG, ED    Imaging Review Dg Chest 2 View  03/11/2015  CLINICAL DATA:  Four-week history of cough and congestion ; fever EXAM: CHEST  2 VIEW COMPARISON:  April 17, 2010 chest radiograph; chest CT July 14, 2011 FINDINGS: The lungs are clear. The heart size and pulmonary vascularity are normal. No adenopathy. There is upper thoracic levoscoliosis with thoracolumbar dextroscoliosis peer IMPRESSION: No edema or consolidation. Electronically Signed   By: Lowella Grip III M.D.   On: 03/11/2015 09:01   I have personally reviewed and evaluated these images and lab results as part of my medical decision-making.   EKG Interpretation None      MDM   Final diagnoses:  Viral URI with cough    Pt prescribed phenergan/codeine, advised increased fluids, cough lozenges.  Prn f/u    Evalee Jefferson, PA-C 03/11/15 [place], MD 03/11/15  1256

## 2015-03-11 NOTE — ED Notes (Signed)
Patient with no complaints at this time. Respirations even and unlabored. Skin warm/dry. Discharge instructions reviewed with patient at this time. Patient given opportunity to voice concerns/ask questions. Patient discharged at this time and left Emergency Department with steady gait.   

## 2015-03-11 NOTE — Discharge Instructions (Signed)
Cough, Adult °Coughing is a reflex that clears your throat and your airways. Coughing helps to heal and protect your lungs. It is normal to cough occasionally, but a cough that happens with other symptoms or lasts a long time may be a sign of a condition that needs treatment. A cough may last only 2-3 weeks (acute), or it may last longer than 8 weeks (chronic). °CAUSES °Coughing is commonly caused by: °· Breathing in substances that irritate your lungs. °· A viral or bacterial respiratory infection. °· Allergies. °· Asthma. °· Postnasal drip. °· Smoking. °· Acid backing up from the stomach into the esophagus (gastroesophageal reflux). °· Certain medicines. °· Chronic lung problems, including COPD (or rarely, lung cancer). °· Other medical conditions such as heart failure. °HOME CARE INSTRUCTIONS  °Pay attention to any changes in your symptoms. Take these actions to help with your discomfort: °· Take medicines only as told by your health care provider. °· If you were prescribed an antibiotic medicine, take it as told by your health care provider. Do not stop taking the antibiotic even if you start to feel better. °· Talk with your health care provider before you take a cough suppressant medicine. °· Drink enough fluid to keep your urine clear or pale yellow. °· If the air is dry, use a cold steam vaporizer or humidifier in your bedroom or your home to help loosen secretions. °· Avoid anything that causes you to cough at work or at home. °· If your cough is worse at night, try sleeping in a semi-upright position. °· Avoid cigarette smoke. If you smoke, quit smoking. If you need help quitting, ask your health care provider. °· Avoid caffeine. °· Avoid alcohol. °· Rest as needed. °SEEK MEDICAL CARE IF:  °· You have new symptoms. °· You cough up pus. °· Your cough does not get better after 2-3 weeks, or your cough gets worse. °· You cannot control your cough with suppressant medicines and you are losing sleep. °· You  develop pain that is getting worse or pain that is not controlled with pain medicines. °· You have a fever. °· You have unexplained weight loss. °· You have night sweats. °SEEK IMMEDIATE MEDICAL CARE IF: °· You cough up blood. °· You have difficulty breathing. °· Your heartbeat is very fast. °  °This information is not intended to replace advice given to you by your health care provider. Make sure you discuss any questions you have with your health care provider. °  °Document Released: 07/02/2010 Document Revised: 09/24/2014 Document Reviewed: 03/12/2014 °Elsevier Interactive Patient Education ©2016 [employer] ° °Upper Respiratory Infection, Adult °Most upper respiratory infections (URIs) are a viral infection of the air passages leading to the lungs. A URI affects the nose, throat, and upper air passages. The most common type of URI is nasopharyngitis and is typically referred to as "the common cold." °URIs run their course and usually go away on their own. Most of the time, a URI does not require medical attention, but sometimes a bacterial infection in the upper airways can follow a viral infection. This is called a secondary infection. Sinus and middle ear infections are common types of secondary upper respiratory infections. °Bacterial pneumonia can also complicate a URI. A URI can worsen asthma and chronic obstructive pulmonary disease (COPD). Sometimes, these complications can require emergency medical care and may be life threatening.  °CAUSES °Almost all URIs are caused by viruses. A virus is a type of germ and can spread from one   person to another.  RISKS FACTORS You may be at risk for a URI if:   You smoke.   You have chronic heart or lung disease.  You have a weakened defense (immune) system.   You are very young or very old.   You have nasal allergies or asthma.  You work in crowded or poorly ventilated areas.  You work in health care facilities or schools. SIGNS AND SYMPTOMS    Symptoms typically develop 2-3 days after you come in contact with a cold virus. Most viral URIs last 7-10 days. However, viral URIs from the influenza virus (flu virus) can last 14-18 days and are typically more severe. Symptoms may include:   Runny or stuffy (congested) nose.   Sneezing.   Cough.   Sore throat.   Headache.   Fatigue.   Fever.   Loss of appetite.   Pain in your forehead, behind your eyes, and over your cheekbones (sinus pain).  Muscle aches.  DIAGNOSIS  Your health care provider may diagnose a URI by:  Physical exam.  Tests to check that your symptoms are not due to another condition such as:  Strep throat.  Sinusitis.  Pneumonia.  Asthma. TREATMENT  A URI goes away on its own with time. It cannot be cured with medicines, but medicines may be prescribed or recommended to relieve symptoms. Medicines may help:  Reduce your fever.  Reduce your cough.  Relieve nasal congestion. HOME CARE INSTRUCTIONS   Take medicines only as directed by your health care provider.   Gargle warm saltwater or take cough drops to comfort your throat as directed by your health care provider.  Use a warm mist humidifier or inhale steam from a shower to increase air moisture. This may make it easier to breathe.  Drink enough fluid to keep your urine clear or pale yellow.   Eat soups and other clear broths and maintain good nutrition.   Rest as needed.   Return to work when your temperature has returned to normal or as your health care provider advises. You may need to stay home longer to avoid infecting others. You can also use a face mask and careful hand washing to prevent spread of the virus.  Increase the usage of your inhaler if you have asthma.   Do not use any tobacco products, including cigarettes, chewing tobacco, or electronic cigarettes. If you need help quitting, ask your health care provider. PREVENTION  The best way to protect  yourself from getting a cold is to practice good hygiene.   Avoid oral or hand contact with people with cold symptoms.   Wash your hands often if contact occurs.  There is no clear evidence that vitamin C, vitamin E, echinacea, or exercise reduces the chance of developing a cold. However, it is always recommended to get plenty of rest, exercise, and practice good nutrition.  SEEK MEDICAL CARE IF:   You are getting worse rather than better.   Your symptoms are not controlled by medicine.   You have chills.  You have worsening shortness of breath.  You have brown or red mucus.  You have yellow or brown nasal discharge.  You have pain in your face, especially when you bend forward.  You have a fever.  You have swollen neck glands.  You have pain while swallowing.  You have white areas in the back of your throat. SEEK IMMEDIATE MEDICAL CARE IF:   You have severe or persistent:  Headache.  Ear pain.  Sinus pain.  Chest pain.  You have chronic lung disease and any of the following:  Wheezing.  Prolonged cough.  Coughing up blood.  A change in your usual mucus.  You have a stiff neck.  You have changes in your:  Vision.  Hearing.  Thinking.  Mood. MAKE SURE YOU:   Understand these instructions.  Will watch your condition.  Will get help right away if you are not doing well or get worse.   This information is not intended to replace advice given to you by your health care provider. Make sure you discuss any questions you have with your health care provider.   Document Released: 06/29/2000 Document Revised: 05/20/2014 Document Reviewed: 04/10/2013 Elsevier Interactive Patient Education 2016 [place] may take the codeine cough syrup prescribed for cough relief.  This will make you drowsy - do not drive within 4 hours of taking this medication.  Increase your fluid intake, cough lozenges may also be helpful.

## 2015-03-11 NOTE — ED Notes (Signed)
Pt c/o cough/congestion/fever/body aches x 4 weeks. Pt states she was seen in ED x 2 weeks ago and started on Claritin D with no improvement.

## 2015-03-31 ENCOUNTER — Telehealth: Payer: Self-pay | Admitting: Adult Health

## 2015-03-31 NOTE — Telephone Encounter (Signed)
Has yeast infection to come Thursday at 12 N

## 2015-04-02 ENCOUNTER — Encounter: Payer: Self-pay | Admitting: Adult Health

## 2015-04-02 ENCOUNTER — Ambulatory Visit (INDEPENDENT_AMBULATORY_CARE_PROVIDER_SITE_OTHER): Payer: BLUE CROSS/BLUE SHIELD | Admitting: Adult Health

## 2015-04-02 VITALS — BP 110/70 | HR 86 | Ht 62.0 in | Wt 148.5 lb

## 2015-04-02 DIAGNOSIS — Z30011 Encounter for initial prescription of contraceptive pills: Secondary | ICD-10-CM | POA: Diagnosis not present

## 2015-04-02 DIAGNOSIS — A499 Bacterial infection, unspecified: Secondary | ICD-10-CM | POA: Diagnosis not present

## 2015-04-02 DIAGNOSIS — N898 Other specified noninflammatory disorders of vagina: Secondary | ICD-10-CM

## 2015-04-02 DIAGNOSIS — Z3202 Encounter for pregnancy test, result negative: Secondary | ICD-10-CM

## 2015-04-02 DIAGNOSIS — B9689 Other specified bacterial agents as the cause of diseases classified elsewhere: Secondary | ICD-10-CM

## 2015-04-02 DIAGNOSIS — N760 Acute vaginitis: Secondary | ICD-10-CM | POA: Diagnosis not present

## 2015-04-02 HISTORY — DX: Other specified bacterial agents as the cause of diseases classified elsewhere: B96.89

## 2015-04-02 LAB — POCT WET PREP (WET MOUNT): WBC, Wet Prep HPF POC: POSITIVE

## 2015-04-02 LAB — POCT URINE PREGNANCY: Preg Test, Ur: NEGATIVE

## 2015-04-02 MED ORDER — NORETHIN-ETH ESTRAD-FE BIPHAS 1 MG-10 MCG / 10 MCG PO TABS: 1.0000 | ORAL_TABLET | Freq: Every day | ORAL | Status: DC

## 2015-04-02 MED ORDER — METRONIDAZOLE 500 MG PO TABS: 500.0000 mg | ORAL_TABLET | Freq: Two times a day (BID) | ORAL | Status: DC

## 2015-04-02 NOTE — Patient Instructions (Signed)
Bacterial Vaginosis Bacterial vaginosis is a vaginal infection that occurs when the normal balance of bacteria in the vagina is disrupted. It results from an overgrowth of certain bacteria. This is the most common vaginal infection in women of childbearing age. Treatment is important to prevent complications, especially in pregnant women, as it can cause a premature delivery. CAUSES  Bacterial vaginosis is caused by an increase in harmful bacteria that are normally present in smaller amounts in the vagina. Several different kinds of bacteria can cause bacterial vaginosis. However, the reason that the condition develops is not fully understood. RISK FACTORS Certain activities or behaviors can put you at an increased risk of developing bacterial vaginosis, including:  Having a new sex partner or multiple sex partners.  Douching.  Using an intrauterine device (IUD) for contraception. Women do not get bacterial vaginosis from toilet seats, bedding, swimming pools, or contact with objects around them. SIGNS AND SYMPTOMS  Some women with bacterial vaginosis have no signs or symptoms. Common symptoms include:  Grey vaginal discharge.  A fishlike odor with discharge, especially after sexual intercourse.  Itching or burning of the vagina and vulva.  Burning or pain with urination. DIAGNOSIS  Your health care provider will take a medical history and examine the vagina for signs of bacterial vaginosis. A sample of vaginal fluid may be taken. Your health care provider will look at this sample under a microscope to check for bacteria and abnormal cells. A vaginal pH test may also be done.  TREATMENT  Bacterial vaginosis may be treated with antibiotic medicines. These may be given in the form of a pill or a vaginal cream. A second round of antibiotics may be prescribed if the condition comes back after treatment. Because bacterial vaginosis increases your risk for sexually transmitted diseases, getting  treated can help reduce your risk for chlamydia, gonorrhea, HIV, and herpes. HOME CARE INSTRUCTIONS   Only take over-the-counter or prescription medicines as directed by your health care provider.  If antibiotic medicine was prescribed, take it as directed. Make sure you finish it even if you start to feel better.  Tell all sexual partners that you have a vaginal infection. They should see their health care provider and be treated if they have problems, such as a mild rash or itching.  During treatment, it is important that you follow these instructions:  Avoid sexual activity or use condoms correctly.  Do not douche.  Avoid alcohol as directed by your health care provider.  Avoid breastfeeding as directed by your health care provider. SEEK MEDICAL CARE IF:   Your symptoms are not improving after 3 days of treatment.  You have increased discharge or pain.  You have a fever. MAKE SURE YOU:   Understand these instructions.  Will watch your condition.  Will get help right away if you are not doing well or get worse. FOR MORE INFORMATION  Centers for Disease Control and Prevention, Division of STD Prevention: [employer] American Sexual Health Association (ASHA): www.ashastd.org    This information is not intended to replace advice given to you by your health care provider. Make sure you discuss any questions you have with your health care provider.   Document Released: 01/03/2005 Document Revised: 01/24/2014 Document Reviewed: 08/15/2012 Elsevier Interactive Patient Education 2016 [place] lo loestrin today Use condoms No alochol

## 2015-04-02 NOTE — Progress Notes (Signed)
Subjective:     Patient ID: Deanna Hughes, female   DOB: 10-31-1990, 25 y.o.   MRN: [id number]  HPI Olufunke is a 25 year old black female in to get on birth control and has discharge with odor at times.Had nexplanon removed and February and has not had period yet, has been using condoms.   Review of Systems Patient denies any headaches, hearing loss, fatigue, blurred vision, shortness of breath, chest pain, abdominal pain, problems with bowel movements, urination, or intercourse. No joint pain or mood swings.See HPI for positives. Reviewed past medical,surgical, social and family history. Reviewed medications and allergies.     Objective:   Physical Exam BP 110/70 mmHg  Pulse 86  Ht 5\' 2"  (1.575 m)  Wt 148 lb 8 oz (67.359 kg)  BMI 27.15 kg/m2UPT negative, Skin warm and dry.Pelvic: external genitalia is normal in appearance no lesions, vagina: white discharge without odor,urethra has no lesions or masses noted, cervix:smooth and bulbous, uterus: normal size, shape and contour, non tender, no masses felt, adnexa: no masses or tenderness noted. Bladder is non tender and no masses felt. Wet prep: + for clue cells and +WBCs.  Start lo loestrin today, use condoms for at least 1 month, Face time 15 minutes.     Assessment:     Contraceptive management Vaginal discharge BV    Plan:      Rx flagyl 500 mg 1 bid x 7 days, no alcohol, review handout on BV   Rx lo loestrin take 1 daily disp 1 pack with 11 refills, 1 pack given to start today Use condoms, 2 given  Follow up prn

## 2015-04-08 ENCOUNTER — Emergency Department (HOSPITAL_COMMUNITY)
Admission: EM | Admit: 2015-04-08 | Discharge: 2015-04-08 | Disposition: A | Discharge status: Home / Self Care | Payer: BLUE CROSS/BLUE SHIELD | Attending: Emergency Medicine | Admitting: Emergency Medicine

## 2015-04-08 ENCOUNTER — Ambulatory Visit: Payer: BLUE CROSS/BLUE SHIELD | Admitting: Adult Health

## 2015-04-08 ENCOUNTER — Encounter (HOSPITAL_COMMUNITY): Payer: Self-pay | Admitting: Emergency Medicine

## 2015-04-08 DIAGNOSIS — F329 Major depressive disorder, single episode, unspecified: Secondary | ICD-10-CM | POA: Insufficient documentation

## 2015-04-08 DIAGNOSIS — J039 Acute tonsillitis, unspecified: Secondary | ICD-10-CM | POA: Diagnosis not present

## 2015-04-08 DIAGNOSIS — J029 Acute pharyngitis, unspecified: Secondary | ICD-10-CM | POA: Diagnosis present

## 2015-04-08 LAB — RAPID STREP SCREEN (MED CTR MEBANE ONLY): Streptococcus, Group A Screen (Direct): NEGATIVE

## 2015-04-08 MED ORDER — PENICILLIN V POTASSIUM 500 MG PO TABS: 500.0000 mg | ORAL_TABLET | Freq: Four times a day (QID) | ORAL | Status: AC

## 2015-04-08 NOTE — Discharge Instructions (Signed)

## 2015-04-08 NOTE — ED Provider Notes (Signed)
CSN: [id number]     Arrival date & time 04/08/15  0840 History   First MD Initiated Contact with Patient 04/08/15 [phone]     Chief Complaint  Patient presents with  . Sore Throat     (Consider location/radiation/quality/duration/timing/severity/associated sxs/prior Treatment) Patient is a 25 y.o. female presenting with pharyngitis. The history is provided by the patient. No language interpreter was used.  Sore Throat This is a new problem. The current episode started today. The problem occurs constantly. The problem has been gradually worsening. Associated symptoms include a sore throat. Nothing aggravates the symptoms. She has tried nothing for the symptoms. The treatment provided moderate relief.  Pt complains of sore throat.  Pt was exposed to strep  Past Medical History  Diagnosis Date  . Right sided abdominal pain   . Scoliosis   . Chlamydia     treated 6/6  . Depression   . Yeast infection 07/03/2012  . Seasonal allergies 07/03/2012  . Vaginal discharge 10/03/2012  . Contraceptive management 01/25/2013  . Threatened abortion in early pregnancy 03/20/2013  . Miscarriage 03/27/2013  . Nexplanon insertion 04/11/2013    nexplanon inserted 04/11/13 remove 3/32/18  . Irregular bleeding 06/03/2013  . Sinus infection 08/27/2013  . Chronic headaches   . Chronic back pain   . Encounter for Nexplanon removal 02/27/2015  . BV (bacterial vaginosis) 04/02/2015   History reviewed. No pertinent past surgical history. Family History  Problem Relation Age of Onset  . Asthma Mother   . Asthma Sister   . Asthma Brother   . Asthma Daughter   . Diabetes Maternal Grandmother   . Hypertension Maternal Grandmother   . Asthma Maternal Grandmother   . Heart disease Maternal Grandmother   . Stroke Maternal Grandmother   . Hypertension Maternal Grandfather   . Asthma Maternal Grandfather   . Heart disease Maternal Grandfather   . Asthma Son    Social History  Substance Use Topics  . Smoking status:  Never Smoker   . Smokeless tobacco: Never Used  . Alcohol Use: No   OB History    Gravida Para Term Preterm AB TAB SAB Ectopic Multiple Living   4 2 2  0 2 0 2 0 0 2     Review of Systems  HENT: Positive for sore throat.   All other systems reviewed and are negative.     Allergies  Review of patient's allergies indicates no known allergies.  Home Medications   Prior to Admission medications   Medication Sig Start Date End Date Taking? Authorizing Provider  metroNIDAZOLE (FLAGYL) 500 MG tablet Take 1 tablet (500 mg total) by mouth 2 (two) times daily. 04/02/15   Estill Dooms, NP  Norethindrone-Ethinyl Estradiol-Fe Biphas (LO LOESTRIN FE) 1 MG-10 MCG / 10 MCG tablet Take 1 tablet by mouth daily. Take 1 daily by mouth 04/02/15   Estill Dooms, NP   BP 102/64 mmHg  Pulse 94  Temp(Src) 98.7 F (37.1 C) (Oral)  Resp 18  Ht 5\' 2"  (1.575 m)  Wt 58.968 kg  BMI 23.77 kg/m2  SpO2 100%  LMP 03/18/2015 Physical Exam  Constitutional: She appears well-developed and well-nourished.  HENT:  Head: Normocephalic and atraumatic.  Right Ear: External ear normal.  Left Ear: External ear normal.  Nose: Nose normal.  Erythema throat, injected  Eyes: Conjunctivae are normal. Pupils are equal, round, and reactive to light.  Neck: Normal range of motion. Neck supple.  Cardiovascular: Normal rate and normal heart sounds.  Neurological: She is alert.  Skin: Skin is warm.  Psychiatric: She has a normal mood and affect.  Nursing note and vitals reviewed.   ED Course  Procedures (including critical care time) Labs Review Labs Reviewed  RAPID STREP SCREEN (NOT AT [facility])    Imaging Review No results found. I have personally reviewed and evaluated these images and lab results as part of my medical decision-making.   EKG Interpretation None      MDM   Final diagnoses:  Tonsillitis    Meds ordered this encounter  Medications  . penicillin v potassium (VEETID) 500 MG  tablet    Sig: Take 1 tablet (500 mg total) by mouth 4 (four) times daily.    Dispense:  40 tablet    Refill:  0    Order Specific Question:  Supervising Provider    Answer:  Noemi Chapel [[id number]]   An After Visit Summary was printed and given to the patient.   [place], PA-C 04/08/15 [place], MD 04/08/15 [phone]

## 2015-04-08 NOTE — ED Notes (Signed)
Sore throat for last 3 days.  Temp last night 103.1, did not take any medication.

## 2015-04-10 LAB — CULTURE, GROUP A STREP (THRC)

## 2015-05-29 ENCOUNTER — Emergency Department (HOSPITAL_COMMUNITY)
Admission: EM | Admit: 2015-05-29 | Discharge: 2015-05-29 | Disposition: A | Discharge status: Home / Self Care | Payer: BLUE CROSS/BLUE SHIELD | Attending: Emergency Medicine | Admitting: Emergency Medicine

## 2015-05-29 ENCOUNTER — Encounter (HOSPITAL_COMMUNITY): Payer: Self-pay | Admitting: Emergency Medicine

## 2015-05-29 DIAGNOSIS — J302 Other seasonal allergic rhinitis: Secondary | ICD-10-CM

## 2015-05-29 DIAGNOSIS — F329 Major depressive disorder, single episode, unspecified: Secondary | ICD-10-CM | POA: Insufficient documentation

## 2015-05-29 DIAGNOSIS — R490 Dysphonia: Secondary | ICD-10-CM | POA: Diagnosis present

## 2015-05-29 MED ORDER — FLUTICASONE PROPIONATE 50 MCG/ACT NA SUSP: 2.0000 | Freq: Every day | NASAL | Status: DC

## 2015-05-29 MED ORDER — CETIRIZINE-PSEUDOEPHEDRINE ER 5-120 MG PO TB12: 1.0000 | ORAL_TABLET | Freq: Two times a day (BID) | ORAL | Status: DC

## 2015-05-29 MED ORDER — PREDNISONE 10 MG PO TABS: ORAL_TABLET | ORAL | Status: DC

## 2015-05-29 NOTE — ED Notes (Signed)
Pt states she has been hoarse for 3 weeks.

## 2015-05-29 NOTE — Discharge Instructions (Signed)
Allergic Rhinitis Allergic rhinitis is when the mucous membranes in the nose respond to allergens. Allergens are particles in the air that cause your body to have an allergic reaction. This causes you to release allergic antibodies. Through a chain of events, these eventually cause you to release histamine into the blood stream. Although meant to protect the body, it is this release of histamine that causes your discomfort, such as frequent sneezing, congestion, and an itchy, runny nose.  CAUSES Seasonal allergic rhinitis (hay fever) is caused by pollen allergens that may come from grasses, trees, and weeds. Year-round allergic rhinitis (perennial allergic rhinitis) is caused by allergens such as house dust mites, pet dander, and mold spores. SYMPTOMS  Nasal stuffiness (congestion).  Itchy, runny nose with sneezing and tearing of the eyes. DIAGNOSIS Your health care provider can help you determine the allergen or allergens that trigger your symptoms. If you and your health care provider are unable to determine the allergen, skin or blood testing may be used. Your health care provider will diagnose your condition after taking your health history and performing a physical exam. Your health care provider may assess you for other related conditions, such as asthma, pink eye, or an ear infection. TREATMENT Allergic rhinitis does not have a cure, but it can be controlled by:  Medicines that block allergy symptoms. These may include allergy shots, nasal sprays, and oral antihistamines.  Avoiding the allergen. Hay fever may often be treated with antihistamines in pill or nasal spray forms. Antihistamines block the effects of histamine. There are over-the-counter medicines that may help with nasal congestion and swelling around the eyes. Check with your health care provider before taking or giving this medicine. If avoiding the allergen or the medicine prescribed do not work, there are many new medicines  your health care provider can prescribe. Stronger medicine may be used if initial measures are ineffective. Desensitizing injections can be used if medicine and avoidance does not work. Desensitization is when a patient is given ongoing shots until the body becomes less sensitive to the allergen. Make sure you follow up with your health care provider if problems continue. HOME CARE INSTRUCTIONS It is not possible to completely avoid allergens, but you can reduce your symptoms by taking steps to limit your exposure to them. It helps to know exactly what you are allergic to so that you can avoid your specific triggers. SEEK MEDICAL CARE IF:  You have a fever.  You develop a cough that does not stop easily (persistent).  You have shortness of breath.  You start wheezing.  Symptoms interfere with normal daily activities.   This information is not intended to replace advice given to you by your health care provider. Make sure you discuss any questions you have with your health care provider.   Document Released: 09/28/2000 Document Revised: 01/24/2014 Document Reviewed: 09/10/2012 Elsevier Interactive Patient Education 2016 [employer].  Hoarseness Hoarseness is any abnormal change in your voice.Hoarseness can make it difficult to speak. Your voice may sound raspy, breathy, or strained. Hoarseness is caused by a problem with the vocal cords. The vocal cords are two bands of tissue inside your voice box (larynx). When you speak, your vocal cords move back and forth to create sound. The surfaces of your vocal cords need to be smooth for your voice to sound clear. Swelling or lumps on the vocal cords can cause hoarseness. Common causes of vocal cord problems include:  Upper airway infection.  A long-term cough.  Straining  or overusing your voice.  Smoking.  Allergies.  Vocal cord growths.  Stomach acids that flow up from your stomach and irritate your vocal cords (gastroesophageal  reflux). HOME CARE INSTRUCTIONS Watch your condition for any changes. To ease any discomfort that you feel:  Rest your voice. Do not whisper. Whispering can cause muscle strain.  Do not speak in a loud or harsh voice that makes your hoarseness worse.  Do not use any tobacco products, including cigarettes, chewing tobacco, or electronic cigarettes. If you need help quitting, ask your health care provider.  Avoid secondhand smoke.  Do not eat foods that give you heartburn. Heartburn can make gastroesophageal reflux worse.  Do not drink coffee.  Do not drink alcohol.  Drink enough fluids to keep your urine clear or pale yellow.  Use a humidifier if the air in your home is dry. SEEK MEDICAL CARE IF:  You have hoarseness that lasts longer than 3 weeks.  You almost lose or completelylose your voice for longer than 3 days.  You have pain when you swallow or try to talk.  You feel a lump in your neck. SEEK IMMEDIATE MEDICAL CARE IF:  You have trouble swallowing.  You feel as though you are choking when you swallow.  You cough up blood or vomit blood.  You have trouble breathing.   This information is not intended to replace advice given to you by your health care provider. Make sure you discuss any questions you have with your health care provider.   Document Released: 12/17/2004 Document Revised: 05/20/2014 Document Reviewed: 12/25/2013 Elsevier Interactive Patient Education [employer].

## 2015-05-31 NOTE — ED Provider Notes (Signed)
CSN: [id number]     Arrival date & time 05/29/15  1321 History   First MD Initiated Contact with Patient 05/29/15 1429     Chief Complaint  Patient presents with  . Hoarse     (Consider location/radiation/quality/duration/timing/severity/associated sxs/prior Treatment) The history is provided by the patient.   Deanna Hughes is a 25 y.o. female presenting with a 3 week history of persistent voice hoarsness which is worsened when she first wakes in the morning.  She denies throat pain, swollen tonsils, dysphagia, fevers, chills, wheezing, cough or shortness of breath.  She does endorse having worsened seasonal allergies the past few weeks, describing having nightly problems with nasal congestion, causing her to mouth breath.  Significant other endorses worsened snoring since symptoms began. She has had no meidications and has found no alleviators for her symptoms.  She was treated for tonsillitis 3/17 and completed a course of penicillin. Those sx have resolved.     Past Medical History  Diagnosis Date  . Right sided abdominal pain   . Scoliosis   . Chlamydia     treated 6/6  . Depression   . Yeast infection 07/03/2012  . Seasonal allergies 07/03/2012  . Vaginal discharge 10/03/2012  . Contraceptive management 01/25/2013  . Threatened abortion in early pregnancy 03/20/2013  . Miscarriage 03/27/2013  . Nexplanon insertion 04/11/2013    nexplanon inserted 04/11/13 remove 3/32/18  . Irregular bleeding 06/03/2013  . Sinus infection 08/27/2013  . Chronic headaches   . Chronic back pain   . Encounter for Nexplanon removal 02/27/2015  . BV (bacterial vaginosis) 04/02/2015   History reviewed. No pertinent past surgical history. Family History  Problem Relation Age of Onset  . Asthma Mother   . Asthma Sister   . Asthma Brother   . Asthma Daughter   . Diabetes Maternal Grandmother   . Hypertension Maternal Grandmother   . Asthma Maternal Grandmother   . Heart disease Maternal Grandmother    . Stroke Maternal Grandmother   . Hypertension Maternal Grandfather   . Asthma Maternal Grandfather   . Heart disease Maternal Grandfather   . Asthma Son    Social History  Substance Use Topics  . Smoking status: Never Smoker   . Smokeless tobacco: Never Used  . Alcohol Use: No   OB History    Gravida Para Term Preterm AB TAB SAB Ectopic Multiple Living   4 2 2  0 2 0 2 0 0 2     Review of Systems  Constitutional: Negative for fever and chills.  HENT: Positive for congestion and voice change. Negative for ear pain, rhinorrhea, sinus pressure, sore throat and trouble swallowing.   Eyes: Negative for discharge and itching.  Respiratory: Negative for cough, shortness of breath, wheezing and stridor.   Cardiovascular: Negative for chest pain.  Gastrointestinal: Negative for abdominal pain.  Genitourinary: Negative.       Allergies  Review of patient's allergies indicates no known allergies.  Home Medications   Prior to Admission medications   Medication Sig Start Date End Date Taking? Authorizing Provider  cetirizine-pseudoephedrine (ZYRTEC-D) 5-120 MG tablet Take 1 tablet by mouth 2 (two) times daily. 05/29/15   Evalee Jefferson, PA-C  fluticasone (FLONASE) 50 MCG/ACT nasal spray Place 2 sprays into both nostrils daily. 05/29/15   Evalee Jefferson, PA-C  metroNIDAZOLE (FLAGYL) 500 MG tablet Take 1 tablet (500 mg total) by mouth 2 (two) times daily. 04/02/15   Estill Dooms, NP  Norethindrone-Ethinyl Estradiol-Fe Biphas (LO LOESTRIN FE)  1 MG-10 MCG / 10 MCG tablet Take 1 tablet by mouth daily. Take 1 daily by mouth 04/02/15   Estill Dooms, NP  predniSONE (DELTASONE) 10 MG tablet 6, 5, 4, 3, 2 then 1 tablet by mouth daily for 6 days total. 05/29/15   Evalee Jefferson, PA-C   BP 108/64 mmHg  Pulse 84  Temp(Src) 98.4 F (36.9 C) (Oral)  Resp 16  Ht 5\' 2"  (1.575 m)  Wt 63.504 kg  BMI 25.60 kg/m2  SpO2 99%  LMP 05/18/2015 Physical Exam  Constitutional: She is oriented to person,  place, and time. She appears well-developed and well-nourished.  HENT:  Head: Normocephalic and atraumatic.  Right Ear: Tympanic membrane and ear canal normal.  Left Ear: Tympanic membrane and ear canal normal.  Nose: No mucosal edema or rhinorrhea.  Mouth/Throat: Uvula is midline, oropharynx is clear and moist and mucous membranes are normal. No oropharyngeal exudate, posterior oropharyngeal edema, posterior oropharyngeal erythema or tonsillar abscesses.  Voice slightly hoarse sounding. No "hot potato" quality.  Eyes: Conjunctivae are normal.  Neck: Normal range of motion. Neck supple. No thyroid mass present.  No head or neck adenopathy.  Cardiovascular: Normal rate and normal heart sounds.   Pulmonary/Chest: Effort normal. No respiratory distress. She has no wheezes. She has no rales.  Abdominal: Soft. There is no tenderness.  Musculoskeletal: Normal range of motion.  Neurological: She is alert and oriented to person, place, and time.  Skin: Skin is warm and dry. No rash noted.  Psychiatric: She has a normal mood and affect.    ED Course  Procedures (including critical care time) Labs Review Labs Reviewed - No data to display  Imaging Review No results found. I have personally reviewed and evaluated these images and lab results as part of my medical decision-making.   EKG Interpretation None      MDM   Final diagnoses:  Hoarseness of voice  Seasonal allergies    Suspect nighttime snoring/ probable post nasal drip with nasal congestion, allergy related as source.  No physical findings on exam except for voice change. She was placed on zyrtec d. 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  No stridor or wheezing, no respiratory complaint. Advised to whisper and strive for voice rest while sx clear. Prn f/u for any worsened sx.  Referral given for obtaining  pcp.    Evalee Jefferson, PA-C 05/31/15 [place], MD 05/31/15 [phone]

## 2015-07-24 ENCOUNTER — Other Ambulatory Visit: Payer: Self-pay | Admitting: Adult Health

## 2015-08-01 ENCOUNTER — Other Ambulatory Visit: Payer: Self-pay | Admitting: Adult Health

## 2015-08-07 ENCOUNTER — Telehealth: Payer: Self-pay | Admitting: *Deleted

## 2015-08-07 NOTE — Telephone Encounter (Signed)
Pt c/o abdominal pain. Pt given an appt for evaluation with Derrek Monaco, NP.

## 2015-08-09 ENCOUNTER — Encounter (HOSPITAL_COMMUNITY): Payer: Self-pay | Admitting: *Deleted

## 2015-08-09 DIAGNOSIS — Z3A01 Less than 8 weeks gestation of pregnancy: Secondary | ICD-10-CM | POA: Diagnosis not present

## 2015-08-09 DIAGNOSIS — N63 Unspecified lump in breast: Secondary | ICD-10-CM | POA: Insufficient documentation

## 2015-08-09 DIAGNOSIS — E876 Hypokalemia: Secondary | ICD-10-CM | POA: Diagnosis not present

## 2015-08-09 DIAGNOSIS — O99281 Endocrine, nutritional and metabolic diseases complicating pregnancy, first trimester: Secondary | ICD-10-CM | POA: Insufficient documentation

## 2015-08-09 DIAGNOSIS — O9989 Other specified diseases and conditions complicating pregnancy, childbirth and the puerperium: Secondary | ICD-10-CM | POA: Insufficient documentation

## 2015-08-09 LAB — POC URINE PREG, ED: Preg Test, Ur: POSITIVE — AB

## 2015-08-09 NOTE — ED Triage Notes (Signed)
Pt c/o "knot" to right nipple area of right breast that started 4-5 days ago, denies any drainage,

## 2015-08-10 ENCOUNTER — Emergency Department (HOSPITAL_COMMUNITY): Payer: BLUE CROSS/BLUE SHIELD

## 2015-08-10 ENCOUNTER — Emergency Department (HOSPITAL_COMMUNITY)
Admission: EM | Admit: 2015-08-10 | Discharge: 2015-08-10 | Disposition: A | Discharge status: Home / Self Care | Payer: BLUE CROSS/BLUE SHIELD | Attending: Emergency Medicine | Admitting: Emergency Medicine

## 2015-08-10 DIAGNOSIS — E876 Hypokalemia: Secondary | ICD-10-CM

## 2015-08-10 DIAGNOSIS — R1031 Right lower quadrant pain: Secondary | ICD-10-CM

## 2015-08-10 DIAGNOSIS — N6310 Unspecified lump in the right breast, unspecified quadrant: Secondary | ICD-10-CM

## 2015-08-10 DIAGNOSIS — Z3490 Encounter for supervision of normal pregnancy, unspecified, unspecified trimester: Secondary | ICD-10-CM

## 2015-08-10 LAB — BASIC METABOLIC PANEL
ANION GAP: 3 — AB (ref 5–15)
BUN: 7 mg/dL (ref 6–20)
CO2: 23 mmol/L (ref 22–32)
Calcium: 8.5 mg/dL — ABNORMAL LOW (ref 8.9–10.3)
Chloride: 107 mmol/L (ref 101–111)
Creatinine, Ser: 0.66 mg/dL (ref 0.44–1.00)
GFR calc Af Amer: >60 mL/min (ref >60)
Glucose, Bld: 90 mg/dL (ref 65–99)
POTASSIUM: 2.9 mmol/L — AB (ref 3.5–5.1)
Sodium: 133 mmol/L — ABNORMAL LOW (ref 135–145)

## 2015-08-10 LAB — CBC WITH DIFFERENTIAL/PLATELET
BASOS ABS: 0 10*3/uL (ref 0.0–0.1)
Basophils Relative: 0 %
Eosinophils Absolute: 0.1 10*3/uL (ref 0.0–0.7)
Eosinophils Relative: 1 %
HEMATOCRIT: 33.6 % — AB (ref 36.0–46.0)
Hemoglobin: 11.6 g/dL — ABNORMAL LOW (ref 12.0–15.0)
LYMPHS PCT: 26 %
Lymphs Abs: 2.6 10*3/uL (ref 0.7–4.0)
MCH: 30.8 pg (ref 26.0–34.0)
MCHC: 34.5 g/dL (ref 30.0–36.0)
MCV: 89.1 fL (ref 78.0–100.0)
MONO ABS: 1 10*3/uL (ref 0.1–1.0)
Monocytes Relative: 10 %
NEUTROS ABS: 6.4 10*3/uL (ref 1.7–7.7)
Neutrophils Relative %: 63 %
PLATELETS: 187 10*3/uL (ref 150–400)
RBC: 3.77 MIL/uL — AB (ref 3.87–5.11)
RDW: 12.3 % (ref 11.5–15.5)
WBC: 10 10*3/uL (ref 4.0–10.5)

## 2015-08-10 LAB — WET PREP, GENITAL
SPERM: NONE SEEN
Trich, Wet Prep: NONE SEEN
YEAST WET PREP: NONE SEEN

## 2015-08-10 LAB — URINALYSIS, ROUTINE W REFLEX MICROSCOPIC
Bilirubin Urine: NEGATIVE
GLUCOSE, UA: NEGATIVE mg/dL
Hgb urine dipstick: NEGATIVE
Ketones, ur: NEGATIVE mg/dL
NITRITE: NEGATIVE
Protein, ur: NEGATIVE mg/dL
SPECIFIC GRAVITY, URINE: 1.02 (ref 1.005–1.030)
pH: 6 (ref 5.0–8.0)

## 2015-08-10 LAB — URINE MICROSCOPIC-ADD ON

## 2015-08-10 LAB — HCG, QUANTITATIVE, PREGNANCY: hCG, Beta Chain, Quant, S: 2135 m[IU]/mL — ABNORMAL HIGH (ref <5)

## 2015-08-10 MED ORDER — CEPHALEXIN 500 MG PO CAPS
500.0000 mg | ORAL_CAPSULE | Freq: Once | ORAL | Status: AC
  Administered 2015-08-10: 500 mg via ORAL
  Filled 2015-08-10: qty 1

## 2015-08-10 MED ORDER — POTASSIUM CHLORIDE ER 20 MEQ PO TBCR: 20.0000 meq | EXTENDED_RELEASE_TABLET | Freq: Every day | ORAL | 0 refills | Status: DC

## 2015-08-10 MED ORDER — CEPHALEXIN 500 MG PO CAPS: 500.0000 mg | ORAL_CAPSULE | Freq: Four times a day (QID) | ORAL | 0 refills | Status: DC

## 2015-08-10 MED ORDER — ACETAMINOPHEN 500 MG PO TABS
1000.0000 mg | ORAL_TABLET | Freq: Once | ORAL | Status: AC
  Administered 2015-08-10: 1000 mg via ORAL
  Filled 2015-08-10: qty 2

## 2015-08-10 MED ORDER — POTASSIUM CHLORIDE CRYS ER 20 MEQ PO TBCR
40.0000 meq | EXTENDED_RELEASE_TABLET | Freq: Once | ORAL | Status: AC
  Administered 2015-08-10: 40 meq via ORAL
  Filled 2015-08-10: qty 2

## 2015-08-10 MED ORDER — PRENATAL COMPLETE 14-0.4 MG PO TABS: 1.0000 | ORAL_TABLET | Freq: Every day | ORAL | 4 refills | Status: DC

## 2015-08-10 NOTE — ED Provider Notes (Signed)
TIME SEEN: 12:47 AM 08/10/2015  CHIEF COMPLAINT:  Chief Complaint  Patient presents with  . Breast Mass    HPI:  HPI Comments: Deanna Hughes is a 25 y.o. female who presents to the Emergency Department complaining of a sudden onset, constant, gradually worsening "knot" to right nipple area of right breast onset 4 days ago. States this area is tender to palpation. No drainage from the nipple. No erythema or warmth. No alleviating factors noted. Pt LNMP was 06/01/2015. Pt is currently pregnantBased on our urine pregnancy test. She states she thought that she could be pregnant because she missed her last period. She states she did have one day of spotting on June 23 but this was not a normal cycle for her. She does not have a regular OB-GYN. Pt denies fever, chills, nausea, vaginal bleeding/discharge, abd pain, hematuria, dysuria, vomiting, diarrhea, or any other associated symptoms. No history of STDs. No history of ectopic. Has had one miscarriage.  G: 3 P: 2 A: 1  ROS: See HPI Constitutional: no fever  Eyes: no drainage  ENT: no runny nose   Cardiovascular:  no chest pain  Resp: no SOB  GI: no vomiting GU: no dysuria Integumentary: no rash  Allergy: no hives  Musculoskeletal: no leg swelling  Neurological: no slurred speech ROS otherwise negative  PAST MEDICAL HISTORY/PAST SURGICAL HISTORY:  Past Medical History:  Diagnosis Date  . BV (bacterial vaginosis) 04/02/2015  . Chlamydia    treated 6/6  . Chronic back pain   . Chronic headaches   . Contraceptive management 01/25/2013  . Depression   . Encounter for Nexplanon removal 02/27/2015  . Irregular bleeding 06/03/2013  . Miscarriage 03/27/2013  . Nexplanon insertion 04/11/2013   nexplanon inserted 04/11/13 remove 3/32/18  . Right sided abdominal pain   . Scoliosis   . Seasonal allergies 07/03/2012  . Sinus infection 08/27/2013  . Threatened abortion in early pregnancy 03/20/2013  . Vaginal discharge 10/03/2012  . Yeast infection  07/03/2012    MEDICATIONS:  Prior to Admission medications   Medication Sig Start Date End Date Taking? Authorizing Provider  cetirizine-pseudoephedrine (ZYRTEC-D) 5-120 MG tablet Take 1 tablet by mouth 2 (two) times daily. 05/29/15   Evalee Jefferson, PA-C  fluticasone (FLONASE) 50 MCG/ACT nasal spray Place 2 sprays into both nostrils daily. 05/29/15   Evalee Jefferson, PA-C  metroNIDAZOLE (FLAGYL) 500 MG tablet TAKE 1 TABLET(500 MG) BY MOUTH TWICE DAILY 08/01/15   Estill Dooms, NP  Norethindrone-Ethinyl Estradiol-Fe Biphas (LO LOESTRIN FE) 1 MG-10 MCG / 10 MCG tablet Take 1 tablet by mouth daily. Take 1 daily by mouth 04/02/15   Estill Dooms, NP  predniSONE (DELTASONE) 10 MG tablet 6, 5, 4, 3, 2 then 1 tablet by mouth daily for 6 days total. 05/29/15   Evalee Jefferson, PA-C    ALLERGIES:  No Known Allergies  SOCIAL HISTORY:  Social History  Substance Use Topics  . Smoking status: Never Smoker  . Smokeless tobacco: Never Used  . Alcohol use No    FAMILY HISTORY: Family History  Problem Relation Age of Onset  . Asthma Mother   . Asthma Sister   . Asthma Brother   . Asthma Daughter   . Diabetes Maternal Grandmother   . Hypertension Maternal Grandmother   . Asthma Maternal Grandmother   . Heart disease Maternal Grandmother   . Stroke Maternal Grandmother   . Hypertension Maternal Grandfather   . Asthma Maternal Grandfather   . Heart disease Maternal Grandfather   .  Asthma Son     EXAM: BP 109/61   Pulse 82   Temp 98.8 F (37.1 C) (Oral)   Resp 16   Ht 5\' 2"  (1.575 m)   Wt 130 lb (59 kg)   LMP 07/10/2015   SpO2 100%   BMI 23.78 kg/m  CONSTITUTIONAL: Alert and oriented and responds appropriately to questions. Well-appearing; well-nourished HEAD: Normocephalic  EYES: Conjunctivae clear, PERRL ENT: normal nose; no rhinorrhea; moist mucous membranes NECK: Supple, no meningismus, no LAD  CARD: RRR; S1 and S2 appreciated; no murmurs, no clicks, no rubs, no gallops BREAST:   Left breast appears normal without erythema or warmth. No breast masses appreciated in the left breast or tenderness. At the 4:00 position next to the nipple of the right breast there is a 2 x 2 cm palpable tender area with some warmth but no erythema. There is no lesion with any drainage. No nipple discharge bilaterally. No inverted nipple. No axillary lymphadenopathy. RESP: Normal chest excursion without splinting or tachypnea; breath sounds clear and equal bilaterally; no wheezes, no rhonchi, no rales, no hypoxia or respiratory distress, speaking full sentences ABD/GI: Normal bowel sounds; non-distended; soft, non-tender, no rebound, no guarding, no peritoneal signs GU:  Normal external genitalia. No lesions, rashes noted. Patient has no vaginal bleeding on exam. No vaginal discharge.  No adnexal tenderness, mass or fullness, no cervical motion tenderness. Cervix is not appear friable.  Cervix is closed.  Chaperone present for exam. BACK:  The back appears normal and is non-tender to palpation, there is no CVA tenderness EXT: Normal ROM in all joints; non-tender to palpation; no edema; normal capillary refill; no cyanosis, no calf tenderness or swelling    SKIN: Normal color for age and race; warm; no rash NEURO: Moves all extremities equally, sensation to light touch intact diffusely, cranial nerves II through XII intact PSYCH: The patient's mood and manner are appropriate. Grooming and personal hygiene are appropriate.  MEDICAL DECISION MAKING: Patient here with right-sided breast mass. This may be an abscess. We'll start her on Keflex. Her urine pregnancy test is positive. We'll obtain labs, urine. She is not having abdominal pain, vaginal bleeding, discharge, dysuria, hematuria, nausea, vomiting or diarrhea. At this time I do not feel she needs an emergent trans-vaginal ultrasound. Based on her last menstrual. Patient would be [redacted] weeks pregnant.  ED PROGRESS: I have performed an ultrasound of  patient's breast in see a possible small area of fluid that I do not think is amendable to drainage at this time. Have recommended close follow-up with the breast center but we'll discharge her on antibiotics. I performed a bedside transabdominal ultrasound as well to see if she has an intrauterine pregnancy. She has a lot of tenderness in the right lower quadrant and I am not able to visualize an IUP. Concern for possible ectopic. Will obtain a formal transvaginal ultrasound with Doppler.   Patient's labs are unremarkable other than potassium of 2.9. We'll give oral replacement. No leukocytosis. HCG is 2135. Wet prep unremarkable.   6:00 AM  Ultrasound shows a yolk sac but no fetal pole, gestational sac. No ectopic pregnancy. No torsion. Have recommended outpatient follow-up with an OB/GYN. We'll give her information for OB/GYN follow-up. Recommend Tylenol for pain. Discussed with her she should avoid NSAIDs. We'll discharge with Keflex for her possible breast abscess and have her follow-up with the breast center for a formal ultrasound. We'll discharge on prenatal vitamins. Will also discharge on a course of potassium for  her hypokalemia.  At this time, I do not feel there is any life-threatening condition present. I have reviewed and discussed all results (EKG, imaging, lab, urine as appropriate), exam findings with patient. I have reviewed nursing notes and appropriate previous records.  I feel the patient is safe to be discharged home without further emergent workup. Discussed usual and customary return precautions. Patient and family (if present) verbalize understanding and are comfortable with this plan.  Patient will follow-up with their primary care provider. If they do not have a primary care provider, information for follow-up has been provided to them. All questions have been answered.       EMERGENCY DEPARTMENT US SOFT TISSUE INTERPRETATION "Study: Limited Soft Tissue  Ultrasound"  INDICATIONS: Pain Multiple views of the body part were obtained in real-time with a multi-frequency linear probe PERFORMED BY:  Myself IMAGES ARCHIVED?: Yes SIDE:Right  BODY PART:Breast FINDINGS: Other possible small fluid collection INTERPRETATION:  Possible small fluid collection not amendable to drainage appreciated   CPT:  Breast [phone]   EMERGENCY DEPARTMENT [place] PREGNANCY "Study: Limited Ultrasound of the Pelvis for Pregnancy"  INDICATIONS:Pregnancy(required) Multiple views of the uterus and pelvic cavity were obtained in real-time with a multi-frequency probe.  APPROACH:Transabdominal   PERFORMED BY: Myself  IMAGES ARCHIVED?: Yes  LIMITATIONS: Decompressed bladder  PREGNANCY FREE FLUID: None  ADNEXAL FINDINGS:Left ovary not seen  PREGNANCY FINDINGS: Yolk sac identified but no fetal pole, fetal heart rate  INTERPRETATION: No visualized intrauterine pregnancy  GESTATIONAL AGE, ESTIMATE: 10 weeks  FETAL HEART RATE: n/a  CPT Codes:  [id number] (transabdominal OB)  [phone] (transvaginal OB, Reduced level of service for incomplete exam)         I personally performed the services described in this documentation, which was scribed in my presence. The recorded information has been reviewed and is accurate.    [place], DO 08/10/15 0600

## 2015-08-10 NOTE — Discharge Instructions (Signed)
Please follow-up with the breast center to schedule ultrasound to evaluate the tender mass in your right breast. We are discharging on antibiotics in case this is an area of infection. These antibiotics are safe for you and your fetus.   Your ultrasound showed a yolk sac today that is 5 weeks, 2 days. You will need to follow-up with an OB/GYN as an outpatient. Please take prenatal vitamins every day. You may take Tylenol for pain but aspirin, ibuprofen, Aleve are not safe in pregnancy. Please avoid alcohol, illicit drugs.    Your potassium level was low today at 2.9. We are discharging you with potassium to take for the next week. I recommend that you have this blood tests rechecked in one week. This can be done at the health department.      [facility] [email or web address] [place] # [place], [place] [phone]    [email or web address] [address] [place], [place] ([place]) [place] # [place], [place] [phone]   [facility] [email or web address] [address] [place], [place] [phone]   [facility] [email or web address] [address] [place], [place] [phone]   [facility] [email or web address] [phone]   To find a primary care or specialty doctor please call [phone] or [phone] to access "[place] a Doctor Service."  You may also go on the [facility] website at [email or web address]  There are also multiple [facility] and [facility] practices throughout the [place] that are frequently accepting new patients. You may find a clinic that is close to your home and contact them.  [facility] -  [address] [place] [phone]  [facility] in [place] (also locations in [place] and [place]) -  [place] [address] [place]  [place] [address] [phone]

## 2015-08-11 LAB — GC/CHLAMYDIA PROBE AMP (~~LOC~~) NOT AT ARMC
Chlamydia: NEGATIVE
NEISSERIA GONORRHEA: NEGATIVE

## 2015-08-14 ENCOUNTER — Ambulatory Visit: Payer: Self-pay | Admitting: Adult Health

## 2015-08-17 ENCOUNTER — Other Ambulatory Visit: Payer: Self-pay | Admitting: Women's Health

## 2015-08-17 ENCOUNTER — Other Ambulatory Visit (INDEPENDENT_AMBULATORY_CARE_PROVIDER_SITE_OTHER): Payer: BLUE CROSS/BLUE SHIELD

## 2015-08-17 ENCOUNTER — Encounter: Payer: BLUE CROSS/BLUE SHIELD | Admitting: Adult Health

## 2015-08-17 ENCOUNTER — Telehealth: Payer: Self-pay | Admitting: *Deleted

## 2015-08-17 DIAGNOSIS — O3680X0 Pregnancy with inconclusive fetal viability, not applicable or unspecified: Secondary | ICD-10-CM | POA: Diagnosis not present

## 2015-08-17 DIAGNOSIS — Z3491 Encounter for supervision of normal pregnancy, unspecified, first trimester: Secondary | ICD-10-CM

## 2015-08-17 DIAGNOSIS — Z3A01 Less than 8 weeks gestation of pregnancy: Secondary | ICD-10-CM | POA: Diagnosis not present

## 2015-08-17 NOTE — Progress Notes (Signed)
ADDENDUM:  Sharon hemorrhage noted on US exam today. Moderate in size.

## 2015-08-17 NOTE — Telephone Encounter (Signed)
Spoke with pt. Pt was supposed to be seen for a New OB but [place] at hospital didn't show a fetal pole. New OB was cancelled here and pt had another [place] today. Pt spoke with JAG. Urine was not checked. Pt was told she had a UTI at hospital last Sunday but was not treated. Pt states she is having urinary frequency. I spoke with JAG and she advised pt to come by tomorrow and get sample so we can dip it. Pt voiced understanding and will come by tomorrow at her convenience. [place]

## 2015-08-17 NOTE — Progress Notes (Signed)
Dating [place] today @5 +[redacted] weeks GA by LMP of 07/10/15. Single active fetus with FHR 119. CRL today 0.57cm which is not consistent with LMP. EDD as of today 04/09/16. Cervix is closed. Bilateral ovaries WNL. Fluid is subjectively WNL.

## 2015-08-18 NOTE — Progress Notes (Signed)
This encounter was created in error - please disregard.

## 2015-08-19 ENCOUNTER — Other Ambulatory Visit: Payer: BLUE CROSS/BLUE SHIELD

## 2015-08-19 DIAGNOSIS — N390 Urinary tract infection, site not specified: Secondary | ICD-10-CM

## 2015-08-19 NOTE — Progress Notes (Signed)
Pt was seen at ER and was told she had a UTI. Pt wasn't treated. Urine was sent off for UA C&S per JAG. [place]

## 2015-08-20 LAB — URINE CULTURE

## 2015-08-24 ENCOUNTER — Telehealth: Payer: Self-pay | Admitting: Adult Health

## 2015-08-24 NOTE — Telephone Encounter (Signed)
Spoke with pt letting her know her urine culture didn't grow anything. Pt was advised to push fluids per JAG. Pt has a scheduled appt on the 10th. Pt was advised to keep that appt. [place]

## 2015-08-27 ENCOUNTER — Encounter: Payer: Self-pay | Admitting: Adult Health

## 2015-08-27 ENCOUNTER — Ambulatory Visit (INDEPENDENT_AMBULATORY_CARE_PROVIDER_SITE_OTHER): Payer: BLUE CROSS/BLUE SHIELD

## 2015-08-27 ENCOUNTER — Ambulatory Visit (INDEPENDENT_AMBULATORY_CARE_PROVIDER_SITE_OTHER): Payer: Medicaid Other | Admitting: Adult Health

## 2015-08-27 ENCOUNTER — Other Ambulatory Visit: Payer: Self-pay | Admitting: Adult Health

## 2015-08-27 VITALS — BP 100/50 | HR 70 | Wt 143.0 lb

## 2015-08-27 DIAGNOSIS — Z331 Pregnant state, incidental: Secondary | ICD-10-CM

## 2015-08-27 DIAGNOSIS — Z3481 Encounter for supervision of other normal pregnancy, first trimester: Secondary | ICD-10-CM | POA: Diagnosis not present

## 2015-08-27 DIAGNOSIS — Z3A08 8 weeks gestation of pregnancy: Secondary | ICD-10-CM

## 2015-08-27 DIAGNOSIS — O418X10 Other specified disorders of amniotic fluid and membranes, first trimester, not applicable or unspecified: Secondary | ICD-10-CM

## 2015-08-27 DIAGNOSIS — O210 Mild hyperemesis gravidarum: Secondary | ICD-10-CM

## 2015-08-27 DIAGNOSIS — Z3490 Encounter for supervision of normal pregnancy, unspecified, unspecified trimester: Secondary | ICD-10-CM

## 2015-08-27 DIAGNOSIS — Z3491 Encounter for supervision of normal pregnancy, unspecified, first trimester: Secondary | ICD-10-CM

## 2015-08-27 DIAGNOSIS — Z1389 Encounter for screening for other disorder: Secondary | ICD-10-CM

## 2015-08-27 DIAGNOSIS — Z0283 Encounter for blood-alcohol and blood-drug test: Secondary | ICD-10-CM

## 2015-08-27 DIAGNOSIS — O468X1 Other antepartum hemorrhage, first trimester: Secondary | ICD-10-CM | POA: Diagnosis not present

## 2015-08-27 DIAGNOSIS — Z369 Encounter for antenatal screening, unspecified: Secondary | ICD-10-CM

## 2015-08-27 DIAGNOSIS — Z3682 Encounter for antenatal screening for nuchal translucency: Secondary | ICD-10-CM

## 2015-08-27 HISTORY — DX: Encounter for supervision of normal pregnancy, unspecified, unspecified trimester: Z34.90

## 2015-08-27 LAB — POCT URINALYSIS DIPSTICK
GLUCOSE UA: NEGATIVE
KETONES UA: NEGATIVE
Nitrite, UA: NEGATIVE
Protein, UA: NEGATIVE

## 2015-08-27 MED ORDER — PROMETHAZINE HCL 25 MG PO TABS: 25.0000 mg | ORAL_TABLET | Freq: Four times a day (QID) | ORAL | 1 refills | Status: DC | PRN

## 2015-08-27 MED ORDER — DOXYLAMINE-PYRIDOXINE 10-10 MG PO TBEC: DELAYED_RELEASE_TABLET | ORAL | 1 refills | Status: DC

## 2015-08-27 NOTE — Patient Instructions (Signed)

## 2015-08-27 NOTE — Progress Notes (Signed)
[place] 7+5 single IUP w/ys,pos fht 155 bpm,crl 13.61mm,normal ov's bilat,subchorionic hemorrhage appears to be larger  6.4 x 1.2 x 3.9 cm

## 2015-08-27 NOTE — Progress Notes (Signed)
Subjective:  Deanna Hughes is a 25 y.o. [phone] African American female at [redacted]w[redacted]d by LMP being seen today for her first obstetrical visit.  Her obstetrical history is significant for has [facility], no sex.  Pregnancy history fully reviewed.  Patient reports nausea. Denies vb, cramping, uti s/s, abnormal/malodorous vag d/c, or vulvovaginal itching/irritation.  BP (!) 100/50   Pulse 70   Wt 143 lb (64.9 kg)   LMP 07/10/2015   BMI 26.16 kg/m   HISTORY: OB History  Gravida Para Term Preterm AB Living  5 2 2  0 2 2  SAB TAB Ectopic Multiple Live Births  2 0 0 0 2    # Outcome Date GA Lbr Len/2nd Weight Sex Delivery Anes PTL Lv  5 Current           4 Term 07/21/11 [redacted]w[redacted]d 16:15 / 01:05 9 lb 5 oz (4.224 kg) M Vag-Spont EPI  LIV  3 SAB              Birth Comments: System Generated. Please review and update pregnancy details.  2 SAB              Birth Comments: System Generated. Please review and update pregnancy details.  1 Term  [redacted]w[redacted]d 24:00 7 lb 10 oz (3.459 kg) M Vag-Spont EPI  LIV     Past Medical History:  Diagnosis Date  . BV (bacterial vaginosis) 04/02/2015  . Chlamydia    treated 6/6  . Chronic back pain   . Chronic headaches   . Contraceptive management 01/25/2013  . Depression   . Encounter for Nexplanon removal 02/27/2015  . Irregular bleeding 06/03/2013  . Miscarriage 03/27/2013  . Nexplanon insertion 04/11/2013   nexplanon inserted 04/11/13 remove 3/32/18  . Right sided abdominal pain   . Scoliosis   . Seasonal allergies 07/03/2012  . Sinus infection 08/27/2013  . Supervision of normal pregnancy 08/27/2015    [facility] Care at   7+5 weeks  FOB  Dating By  LMP  Pap  GC/CT Initial:                36+wks: Genetic Screen NT/IT:  CF screen  Anatomic [place]  Flu vaccine  Tdap Recommended ~ 28wks Glucose Screen  2 hr GBS  Feed Preference  Contraception  Circumcision  Childbirth Classes  Pediatrician    . Threatened abortion in early pregnancy 03/20/2013  . Vaginal discharge  10/03/2012  . Yeast infection 07/03/2012   History reviewed. No pertinent surgical history. Family History  Problem Relation Age of Onset  . Asthma Mother   . Asthma Sister   . Asthma Brother   . Asthma Daughter   . Diabetes Maternal Grandmother   . Hypertension Maternal Grandmother   . Asthma Maternal Grandmother   . Heart disease Maternal Grandmother   . Stroke Maternal Grandmother   . Hypertension Maternal Grandfather   . Asthma Maternal Grandfather   . Heart disease Maternal Grandfather   . Asthma Son     Exam   System:     General: Well developed & nourished, no acute distress   Skin: Warm & dry, normal coloration and turgor, no rashes   Neurologic: Alert & oriented, normal mood   Cardiovascular: Regular rate & rhythm   Respiratory: Effort & rate normal, LCTAB, acyanotic   Abdomen: Soft, non tender   Extremities: normal strength, tone   Pelvic Exam:    Perineum: deferred   Vulva: deferred   Vagina:  deferred  Cervix: deferred   Uterus: deferred   Thin prep pap smear 02/06/14 negative FHR: 155 via [place]   Assessment:   Pregnancy: [id number] Patient Active Problem List   Diagnosis Date Noted  . Supervision of normal pregnancy 08/27/2015  . BV (bacterial vaginosis) 04/02/2015  . Encounter for Nexplanon removal 02/27/2015  . Sinus infection 08/27/2013  . Irregular bleeding 06/03/2013  . Miscarriage 03/27/2013  . Threatened abortion in early pregnancy 03/20/2013  . Contraceptive management 01/25/2013  . Vaginal discharge 10/03/2012  . Yeast infection 07/03/2012  . Seasonal allergies 07/03/2012  . Right sided abdominal pain   . Defecation urgency     [redacted]w[redacted]d [id number] New OB visit     Plan:  Initial labs drawn Continue prenatal vitamins Problem list reviewed and updated Reviewed n/v relief measures and warning s/s to report Reviewed recommended weight gain based on pre-gravid BMI Encouraged well-balanced diet Genetic Screening discussed Integrated Screen:  requested Cystic fibrosis screening discussed requested Ultrasound discussed; fetal survey: requested Follow up in 4 weeks for IT/NT and see Manus Gunning  Rx diclegis and eat often No sex, no lifting>25 lbs.  Estill Dooms, NP 08/27/2015 2:07 PM

## 2015-08-27 NOTE — Progress Notes (Signed)
Also gave rx for phenergan 25 mg #30 take 1 every 6 hours prn with 1 refill, till medicaid active and can get diclegis

## 2015-08-29 LAB — GC/CHLAMYDIA PROBE AMP
Chlamydia trachomatis, NAA: NEGATIVE
NEISSERIA GONORRHOEAE BY PCR: NEGATIVE

## 2015-08-29 LAB — URINE CULTURE

## 2015-09-04 LAB — PMP SCREEN PROFILE (10S), URINE
Amphetamine Screen, Ur: NEGATIVE ng/mL
BENZODIAZEPINE SCREEN, URINE: NEGATIVE ng/mL
Barbiturate Screen, Ur: NEGATIVE ng/mL
CANNABINOIDS UR QL SCN: NEGATIVE ng/mL
Cocaine(Metab.)Screen, Urine: NEGATIVE ng/mL
Creatinine(Crt), U: 63.6 mg/dL (ref 20.0–300.0)
Methadone Scn, Ur: NEGATIVE ng/mL
OPIATE SCRN UR: NEGATIVE ng/mL
OXYCODONE+OXYMORPHONE UR QL SCN: NEGATIVE ng/mL
PCP Scrn, Ur: NEGATIVE ng/mL
Ph of Urine: 6.7 (ref 4.5–8.9)
Propoxyphene, Screen: NEGATIVE ng/mL

## 2015-09-04 LAB — URINALYSIS, ROUTINE W REFLEX MICROSCOPIC
Bilirubin, UA: NEGATIVE
GLUCOSE, UA: NEGATIVE
Ketones, UA: NEGATIVE
Nitrite, UA: NEGATIVE
PROTEIN UA: NEGATIVE
RBC, UA: NEGATIVE
Specific Gravity, UA: 1.008 (ref 1.005–1.030)
UUROB: 1 mg/dL (ref 0.2–1.0)
pH, UA: 7 (ref 5.0–7.5)

## 2015-09-04 LAB — CBC
Hematocrit: 39.3 % (ref 34.0–46.6)
Hemoglobin: 13.2 g/dL (ref 11.1–15.9)
MCH: 30.1 pg (ref 26.6–33.0)
MCHC: 33.6 g/dL (ref 31.5–35.7)
MCV: 90 fL (ref 79–97)
PLATELETS: 275 10*3/uL (ref 150–379)
RBC: 4.38 x10E6/uL (ref 3.77–5.28)
RDW: 13.6 % (ref 12.3–15.4)
WBC: 8.3 10*3/uL (ref 3.4–10.8)

## 2015-09-04 LAB — MICROSCOPIC EXAMINATION: CASTS: NONE SEEN /LPF

## 2015-09-04 LAB — RPR: RPR: NONREACTIVE

## 2015-09-04 LAB — HIV ANTIBODY (ROUTINE TESTING W REFLEX): HIV Screen 4th Generation wRfx: NONREACTIVE

## 2015-09-04 LAB — VARICELLA ZOSTER ANTIBODY, IGG: Varicella zoster IgG: 166 index (ref >165)

## 2015-09-04 LAB — SICKLE CELL SCREEN: SICKLE CELL SCREEN: NEGATIVE

## 2015-09-04 LAB — CYSTIC FIBROSIS MUTATION 97: GENE DIS ANAL CARRIER INTERP BLD/T-IMP: NOT DETECTED

## 2015-09-04 LAB — ABO/RH: Rh Factor: POSITIVE

## 2015-09-04 LAB — RUBELLA SCREEN: RUBELLA: 6.28 {index} (ref >0.99)

## 2015-09-04 LAB — HEPATITIS B SURFACE ANTIGEN: Hepatitis B Surface Ag: NEGATIVE

## 2015-09-04 LAB — ANTIBODY SCREEN: ANTIBODY SCREEN: NEGATIVE

## 2015-09-09 ENCOUNTER — Telehealth: Payer: Self-pay | Admitting: Advanced Practice Midwife

## 2015-09-09 NOTE — Telephone Encounter (Signed)
Pt states she is having some spotting with brownish blood today and mild cramping.  Pt denies any recent sex or straining with BM, not passing clots or flow of blood.  Advised pt it can be normal to have some spotting, advised to take some Tylenol and push water and rest when can.  Pt verbalized understanding.

## 2015-09-24 ENCOUNTER — Encounter: Payer: Self-pay | Admitting: Advanced Practice Midwife

## 2015-09-24 ENCOUNTER — Ambulatory Visit (INDEPENDENT_AMBULATORY_CARE_PROVIDER_SITE_OTHER): Payer: BLUE CROSS/BLUE SHIELD

## 2015-09-24 ENCOUNTER — Ambulatory Visit (INDEPENDENT_AMBULATORY_CARE_PROVIDER_SITE_OTHER): Payer: BLUE CROSS/BLUE SHIELD | Admitting: Advanced Practice Midwife

## 2015-09-24 VITALS — BP 110/64 | HR 88 | Wt 139.0 lb

## 2015-09-24 DIAGNOSIS — Z3A12 12 weeks gestation of pregnancy: Secondary | ICD-10-CM

## 2015-09-24 DIAGNOSIS — Z331 Pregnant state, incidental: Secondary | ICD-10-CM

## 2015-09-24 DIAGNOSIS — N898 Other specified noninflammatory disorders of vagina: Secondary | ICD-10-CM

## 2015-09-24 DIAGNOSIS — O23591 Infection of other part of genital tract in pregnancy, first trimester: Secondary | ICD-10-CM | POA: Diagnosis not present

## 2015-09-24 DIAGNOSIS — Z36 Encounter for antenatal screening of mother: Secondary | ICD-10-CM | POA: Diagnosis not present

## 2015-09-24 DIAGNOSIS — Z1389 Encounter for screening for other disorder: Secondary | ICD-10-CM

## 2015-09-24 DIAGNOSIS — Z3682 Encounter for antenatal screening for nuchal translucency: Secondary | ICD-10-CM

## 2015-09-24 DIAGNOSIS — Z3481 Encounter for supervision of other normal pregnancy, first trimester: Secondary | ICD-10-CM

## 2015-09-24 LAB — POCT URINALYSIS DIPSTICK
GLUCOSE UA: NEGATIVE
KETONES UA: NEGATIVE
Leukocytes, UA: NEGATIVE
Nitrite, UA: NEGATIVE
RBC UA: NEGATIVE

## 2015-09-24 MED ORDER — SULFAMETHOXAZOLE-TRIMETHOPRIM 800-160 MG PO TABS: 1.0000 | ORAL_TABLET | Freq: Two times a day (BID) | ORAL | 0 refills | Status: AC

## 2015-09-24 MED ORDER — PRENATE MINI 18-0.6-0.4-350 MG PO CAPS: 1.0000 | ORAL_CAPSULE | Freq: Every day | ORAL | 11 refills | Status: DC

## 2015-09-24 MED ORDER — METRONIDAZOLE 0.75 % VA GEL: 1.0000 | Freq: Every day | VAGINAL | 1 refills | Status: DC

## 2015-09-24 NOTE — Progress Notes (Signed)
White thick discharge

## 2015-09-24 NOTE — Progress Notes (Signed)
[place] 11+5 wks,measurement c/w dates,crl 54.7 mm,normal ov's bilat,subchorionic hemorrhage 3.7 x 3.8 x 1.6 cm,NB present,NT 1.1 mm,fhr 164 bpm

## 2015-09-24 NOTE — Progress Notes (Signed)
[id number] [redacted]w[redacted]d Estimated Date of Delivery: 04/09/16  Blood pressure 110/64, pulse 88, weight 139 lb (63 kg), last menstrual period 07/10/2015.   BP weight and urine results all reviewed and noted.  Please refer to the obstetrical flow sheet for the fundal height and fetal heart rate documentation:    [place] 11+5 wks,measurement c/w dates,crl 54.7 mm,normal ov's bilat,subchorionic hemorrhage 3.7 x 3.8 x 1.6 cm,NB present,NT 1.1 mm,fhr 164 bpm   Patient denies any bleeding and no rupture of membranes symptoms or regular contractions. Patient has a breast abscess on right breast  Went to ED 7/24, did not take Keflex.  Now it is "much worse".  Co exam with LHE.  Will calm down with bactirm, f/u 10 days.   C/O vaginal discharge, irritation for 1 week SSE;  Vagina red, heavy white discharge.  Wet prep few clue, no yeast.   All questions were answered.  Orders Placed This Encounter  Procedures  . Maternal Screen, Integrated #1  . POCT urinalysis dipstick    Plan:  Continued routine obstetrical care, NT/IT today.  Metrogel qhs X 5; Bactrim BID X 10  Return in about 11 days (around 10/05/2015) for F/U abscess w/Dr Elonda Husky.

## 2015-09-29 LAB — MATERNAL SCREEN, INTEGRATED #1
CROWN RUMP LENGTH MAT SCREEN: 54.7 mm
Gest. Age on Collection Date: 12.1 weeks
MATERNAL AGE AT EDD: 23.6 a
NUCHAL TRANSLUCENCY (NT): 1.1 mm
NUMBER OF FETUSES: 1
PAPP-A Value: 1179.7 ng/mL
Weight: 139 [lb_av]

## 2015-10-05 ENCOUNTER — Encounter: Payer: Self-pay | Admitting: Obstetrics & Gynecology

## 2015-10-05 ENCOUNTER — Ambulatory Visit (INDEPENDENT_AMBULATORY_CARE_PROVIDER_SITE_OTHER): Payer: BLUE CROSS/BLUE SHIELD | Admitting: Obstetrics & Gynecology

## 2015-10-05 VITALS — BP 100/60 | HR 88 | Wt 137.0 lb

## 2015-10-05 DIAGNOSIS — Z3A14 14 weeks gestation of pregnancy: Secondary | ICD-10-CM

## 2015-10-05 DIAGNOSIS — Z3491 Encounter for supervision of normal pregnancy, unspecified, first trimester: Secondary | ICD-10-CM

## 2015-10-05 DIAGNOSIS — Z1389 Encounter for screening for other disorder: Secondary | ICD-10-CM

## 2015-10-05 DIAGNOSIS — Z331 Pregnant state, incidental: Secondary | ICD-10-CM

## 2015-10-05 DIAGNOSIS — Z3481 Encounter for supervision of other normal pregnancy, first trimester: Secondary | ICD-10-CM

## 2015-10-05 LAB — POCT URINALYSIS DIPSTICK
Blood, UA: NEGATIVE
Glucose, UA: NEGATIVE
Ketones, UA: NEGATIVE
LEUKOCYTES UA: NEGATIVE
NITRITE UA: NEGATIVE
Protein, UA: 1

## 2015-10-05 MED ORDER — CEPHALEXIN 500 MG PO CAPS: 500.0000 mg | ORAL_CAPSULE | Freq: Three times a day (TID) | ORAL | 0 refills | Status: DC

## 2015-10-05 MED ORDER — ACTICIN 5 % EX CREA: 1.0000 "application " | TOPICAL_CREAM | Freq: Once | CUTANEOUS | 0 refills | Status: AC

## 2015-10-05 NOTE — Progress Notes (Signed)
[id number] [redacted]w[redacted]d Estimated Date of Delivery: 04/09/16  Blood pressure 100/60, pulse 88, weight 137 lb (62.1 kg), last menstrual period 07/10/2015.   BP weight and urine results all reviewed and noted.  Please refer to the obstetrical flow sheet for the fundal height and fetal heart rate documentation:  Patient reports good fetal movement, denies any bleeding and no rupture of membranes symptoms or regular contractions. Patient is without complaints. All questions were answered.  Orders Placed This Encounter  Procedures  . POCT urinalysis dipstick    Plan:  Continued routine obstetrical care, permethrin for scabies, keflex for breast  Return in about 3 weeks (around 10/26/2015) for LROB, 2nd IT.

## 2015-10-26 ENCOUNTER — Encounter: Payer: Self-pay | Admitting: Women's Health

## 2015-10-26 ENCOUNTER — Ambulatory Visit (INDEPENDENT_AMBULATORY_CARE_PROVIDER_SITE_OTHER): Payer: BLUE CROSS/BLUE SHIELD | Admitting: Women's Health

## 2015-10-26 VITALS — BP 104/58 | HR 88 | Wt 138.0 lb

## 2015-10-26 DIAGNOSIS — Z3682 Encounter for antenatal screening for nuchal translucency: Secondary | ICD-10-CM

## 2015-10-26 DIAGNOSIS — Z23 Encounter for immunization: Secondary | ICD-10-CM | POA: Diagnosis not present

## 2015-10-26 DIAGNOSIS — Z1389 Encounter for screening for other disorder: Secondary | ICD-10-CM

## 2015-10-26 DIAGNOSIS — Z363 Encounter for antenatal screening for malformations: Secondary | ICD-10-CM

## 2015-10-26 DIAGNOSIS — N898 Other specified noninflammatory disorders of vagina: Secondary | ICD-10-CM | POA: Diagnosis not present

## 2015-10-26 DIAGNOSIS — Z331 Pregnant state, incidental: Secondary | ICD-10-CM

## 2015-10-26 DIAGNOSIS — O26892 Other specified pregnancy related conditions, second trimester: Secondary | ICD-10-CM | POA: Diagnosis not present

## 2015-10-26 DIAGNOSIS — Z3482 Encounter for supervision of other normal pregnancy, second trimester: Secondary | ICD-10-CM

## 2015-10-26 LAB — POCT URINALYSIS DIPSTICK
Blood, UA: NEGATIVE
Glucose, UA: NEGATIVE
Ketones, UA: NEGATIVE
NITRITE UA: NEGATIVE

## 2015-10-26 LAB — POCT WET PREP (WET MOUNT)
CLUE CELLS WET PREP WHIFF POC: NEGATIVE
Trichomonas Wet Prep HPF POC: ABSENT

## 2015-10-26 NOTE — Patient Instructions (Addendum)
Monistat 7 for yeast  Second Trimester of Pregnancy The second trimester is from week 13 through week 28, months 4 through 6. The second trimester is often a time when you feel your best. Your body has also adjusted to being pregnant, and you begin to feel better physically. Usually, morning sickness has lessened or quit completely, you may have more energy, and you may have an increase in appetite. The second trimester is also a time when the fetus is growing rapidly. At the end of the sixth month, the fetus is about 9 inches long and weighs about 1 pounds. You will likely begin to feel the baby move (quickening) between 18 and 20 weeks of the pregnancy. BODY CHANGES Your body goes through many changes during pregnancy. The changes vary from woman to woman.   Your weight will continue to increase. You will notice your lower abdomen bulging out.  You may begin to get stretch marks on your hips, abdomen, and breasts.  You may develop headaches that can be relieved by medicines approved by your health care provider.  You may urinate more often because the fetus is pressing on your bladder.  You may develop or continue to have heartburn as a result of your pregnancy.  You may develop constipation because certain hormones are causing the muscles that push waste through your intestines to slow down.  You may develop hemorrhoids or swollen, bulging veins (varicose veins).  You may have back pain because of the weight gain and pregnancy hormones relaxing your joints between the bones in your pelvis and as a result of a shift in weight and the muscles that support your balance.  Your breasts will continue to grow and be tender.  Your gums may bleed and may be sensitive to brushing and flossing.  Dark spots or blotches (chloasma, mask of pregnancy) may develop on your face. This will likely fade after the baby is born.  A dark line from your belly button to the pubic area (linea nigra) may  appear. This will likely fade after the baby is born.  You may have changes in your hair. These can include thickening of your hair, rapid growth, and changes in texture. Some women also have hair loss during or after pregnancy, or hair that feels dry or thin. Your hair will most likely return to normal after your baby is born. WHAT TO EXPECT AT YOUR PRENATAL VISITS During a routine prenatal visit:  You will be weighed to make sure you and the fetus are growing normally.  Your blood pressure will be taken.  Your abdomen will be measured to track your baby's growth.  The fetal heartbeat will be listened to.  Any test results from the previous visit will be discussed. Your health care provider may ask you:  How you are feeling.  If you are feeling the baby move.  If you have had any abnormal symptoms, such as leaking fluid, bleeding, severe headaches, or abdominal cramping.  If you are using any tobacco products, including cigarettes, chewing tobacco, and electronic cigarettes.  If you have any questions. Other tests that may be performed during your second trimester include:  Blood tests that check for:  Low iron levels (anemia).  Gestational diabetes (between 24 and 28 weeks).  Rh antibodies.  Urine tests to check for infections, diabetes, or protein in the urine.  An ultrasound to confirm the proper growth and development of the baby.  An amniocentesis to check for possible genetic problems.  Fetal screens for spina bifida and Down syndrome.  HIV (human immunodeficiency virus) testing. Routine prenatal testing includes screening for HIV, unless you choose not to have this test. HOME CARE INSTRUCTIONS   Avoid all smoking, herbs, alcohol, and unprescribed drugs. These chemicals affect the formation and growth of the baby.  Do not use any tobacco products, including cigarettes, chewing tobacco, and electronic cigarettes. If you need help quitting, ask your health care  provider. You may receive counseling support and other resources to help you quit.  Follow your health care provider's instructions regarding medicine use. There are medicines that are either safe or unsafe to take during pregnancy.  Exercise only as directed by your health care provider. Experiencing uterine cramps is a good sign to stop exercising.  Continue to eat regular, healthy meals.  Wear a good support bra for breast tenderness.  Do not use hot tubs, steam rooms, or saunas.  Wear your seat belt at all times when driving.  Avoid raw meat, uncooked cheese, cat litter boxes, and soil used by cats. These carry germs that can cause birth defects in the baby.  Take your prenatal vitamins.  Take 1500-2000 mg of calcium daily starting at the 20th week of pregnancy until you deliver your baby.  Try taking a stool softener (if your health care provider approves) if you develop constipation. Eat more high-fiber foods, such as fresh vegetables or fruit and whole grains. Drink plenty of fluids to keep your urine clear or pale yellow.  Take warm sitz baths to soothe any pain or discomfort caused by hemorrhoids. Use hemorrhoid cream if your health care provider approves.  If you develop varicose veins, wear support hose. Elevate your feet for 15 minutes, 3-4 times a day. Limit salt in your diet.  Avoid heavy lifting, wear low heel shoes, and practice good posture.  Rest with your legs elevated if you have leg cramps or low back pain.  Visit your dentist if you have not gone yet during your pregnancy. Use a soft toothbrush to brush your teeth and be gentle when you floss.  A sexual relationship may be continued unless your health care provider directs you otherwise.  Continue to go to all your prenatal visits as directed by your health care provider. SEEK MEDICAL CARE IF:   You have dizziness.  You have mild pelvic cramps, pelvic pressure, or nagging pain in the abdominal area.  You  have persistent nausea, vomiting, or diarrhea.  You have a bad smelling vaginal discharge.  You have pain with urination. SEEK IMMEDIATE MEDICAL CARE IF:   You have a fever.  You are leaking fluid from your vagina.  You have spotting or bleeding from your vagina.  You have severe abdominal cramping or pain.  You have rapid weight gain or loss.  You have shortness of breath with chest pain.  You notice sudden or extreme swelling of your face, hands, ankles, feet, or legs.  You have not felt your baby move in over an hour.  You have severe headaches that do not go away with medicine.  You have vision changes.   This information is not intended to replace advice given to you by your health care provider. Make sure you discuss any questions you have with your health care provider.   Document Released: 12/28/2000 Document Revised: 01/24/2014 Document Reviewed: 03/06/2012 Elsevier Interactive Patient Education [employer].

## 2015-10-26 NOTE — Progress Notes (Signed)
Low-risk OB appointment [id number] [redacted]w[redacted]d Estimated Date of Delivery: 04/09/16 BP (!) 104/58   Pulse 88   Wt 138 lb (62.6 kg)   LMP 07/10/2015   BMI 25.24 kg/m   BP, weight, and urine reviewed.  Refer to obstetrical flow sheet for FH & FHR.  Feeling some fm. Denies cramping, lof, vb, or uti s/s. Thick white d/c w/o odor/itching after taking antibiotic, 'always gets yeast infections w/ antbx'.  Spec exam: cx closed, small amt thick white nonodorous d/c, wet prep few yeast, can use otc 7night yeast cream if desires Reviewed warning s/s to report. Plan:  Continue routine obstetrical care  F/U in 3wks for OB appointment and anatomy u/s 2nd IT and flu shot today

## 2015-10-27 ENCOUNTER — Telehealth: Payer: Self-pay | Admitting: *Deleted

## 2015-10-27 NOTE — Telephone Encounter (Signed)
Pt called stating that she is at work and started feeling light headed and felt like going to pass out.  She is [redacted] wks pregnant and did eat this morning but it has been since about 8 am.  Advised pt to eat and drink something and see if feels better, if no improvement give [place] a call back. Pt verbalized understanding.

## 2015-10-28 LAB — MATERNAL SCREEN, INTEGRATED #2
AFP MARKER: 32.4 ng/mL
AFP MoM: 0.82
Crown Rump Length: 54.7 mm
DIA MoM: 0.97
DIA VALUE: 178 pg/mL
ESTRIOL UNCONJUGATED: 0.94 ng/mL
GEST. AGE ON COLLECTION DATE: 12.1 wk
Gestational Age: 16.7 weeks
HCG VALUE: 29.2 [IU]/mL
MATERNAL AGE AT EDD: 23.6 a
NUCHAL TRANSLUCENCY (NT): 1.1 mm
Nuchal Translucency MoM: 0.77
Number of Fetuses: 1
PAPP-A MOM: 1.3
PAPP-A Value: 1179.7 ng/mL
Test Results:: NEGATIVE
WEIGHT: 139 [lb_av]
Weight: 138 [lb_av]
hCG MoM: 0.94
uE3 MoM: 0.97

## 2015-11-16 ENCOUNTER — Encounter: Payer: Self-pay | Admitting: Women's Health

## 2015-11-16 ENCOUNTER — Ambulatory Visit (INDEPENDENT_AMBULATORY_CARE_PROVIDER_SITE_OTHER): Payer: BLUE CROSS/BLUE SHIELD | Admitting: Women's Health

## 2015-11-16 ENCOUNTER — Ambulatory Visit (INDEPENDENT_AMBULATORY_CARE_PROVIDER_SITE_OTHER): Payer: BLUE CROSS/BLUE SHIELD

## 2015-11-16 VITALS — BP 118/58 | HR 76 | Wt 140.0 lb

## 2015-11-16 DIAGNOSIS — Z363 Encounter for antenatal screening for malformations: Secondary | ICD-10-CM | POA: Diagnosis not present

## 2015-11-16 DIAGNOSIS — Z3482 Encounter for supervision of other normal pregnancy, second trimester: Secondary | ICD-10-CM

## 2015-11-16 DIAGNOSIS — Z331 Pregnant state, incidental: Secondary | ICD-10-CM

## 2015-11-16 DIAGNOSIS — Z1389 Encounter for screening for other disorder: Secondary | ICD-10-CM

## 2015-11-16 DIAGNOSIS — Z3A20 20 weeks gestation of pregnancy: Secondary | ICD-10-CM

## 2015-11-16 NOTE — Patient Instructions (Signed)

## 2015-11-16 NOTE — Progress Notes (Signed)
[place] [id number] wks,cephalic,normal ov's bilat,ant pl gr 0,cx 4.4 cm,svp of fluid 5.2 cm,efw 270 g,anatomy complete,no obvious abnormalities seen

## 2015-11-16 NOTE — Progress Notes (Signed)
Low-risk OB appointment [id number] [redacted]w[redacted]d Estimated Date of Delivery: 04/09/16 BP (!) 118/58   Pulse 76   Wt 140 lb (63.5 kg)   LMP 07/10/2015   BMI 25.61 kg/m   BP, weight, and urine reviewed.  Refer to obstetrical flow sheet for FH & FHR.  Reports some fm.  Denies regular uc's, lof, vb, or uti s/s. Pain in bilateral groin L>R, has known inguinal hernia after last baby. No apparent hernia felt today- can try off-brand Spanx to hold pressure, and pregnancy belt.  Reviewed normal nt/it results, today's normal anatomy u/s, warning s/s to report. Plan:  Continue routine obstetrical care  F/U in 4wks for OB appointment

## 2015-11-19 ENCOUNTER — Telehealth: Payer: Self-pay | Admitting: Women's Health

## 2015-11-19 ENCOUNTER — Encounter: Payer: Self-pay | Admitting: Women's Health

## 2015-11-19 ENCOUNTER — Other Ambulatory Visit: Payer: Self-pay | Admitting: Women's Health

## 2015-11-19 MED ORDER — MICONAZOLE NITRATE 2 % VA CREA: 1.0000 | TOPICAL_CREAM | Freq: Every day | VAGINAL | 0 refills | Status: DC

## 2015-11-19 NOTE — Telephone Encounter (Signed)
Patient called stating medication was going to be sent to pharmacy for yeast infection but medication was not at pharmacy. Please send.

## 2015-12-14 ENCOUNTER — Ambulatory Visit (INDEPENDENT_AMBULATORY_CARE_PROVIDER_SITE_OTHER): Payer: BLUE CROSS/BLUE SHIELD | Admitting: Women's Health

## 2015-12-14 VITALS — BP 110/60 | HR 100 | Wt 146.5 lb

## 2015-12-14 DIAGNOSIS — Z1389 Encounter for screening for other disorder: Secondary | ICD-10-CM

## 2015-12-14 DIAGNOSIS — K4090 Unilateral inguinal hernia, without obstruction or gangrene, not specified as recurrent: Secondary | ICD-10-CM

## 2015-12-14 DIAGNOSIS — N898 Other specified noninflammatory disorders of vagina: Secondary | ICD-10-CM | POA: Diagnosis not present

## 2015-12-14 DIAGNOSIS — O26892 Other specified pregnancy related conditions, second trimester: Secondary | ICD-10-CM | POA: Diagnosis not present

## 2015-12-14 DIAGNOSIS — Z3482 Encounter for supervision of other normal pregnancy, second trimester: Secondary | ICD-10-CM

## 2015-12-14 DIAGNOSIS — Z3A23 23 weeks gestation of pregnancy: Secondary | ICD-10-CM

## 2015-12-14 DIAGNOSIS — Z331 Pregnant state, incidental: Secondary | ICD-10-CM

## 2015-12-14 HISTORY — DX: Unilateral inguinal hernia, without obstruction or gangrene, not specified as recurrent: K40.90

## 2015-12-14 LAB — POCT URINALYSIS DIPSTICK
Blood, UA: NEGATIVE
GLUCOSE UA: NEGATIVE
GLUCOSE UA: NEGATIVE
KETONES UA: NEGATIVE
Leukocytes, UA: NEGATIVE
Nitrite, UA: NEGATIVE
Protein, UA: NEGATIVE
Protein, UA: NEGATIVE

## 2015-12-14 LAB — POCT WET PREP (WET MOUNT)
CLUE CELLS WET PREP WHIFF POC: NEGATIVE
Trichomonas Wet Prep HPF POC: ABSENT

## 2015-12-14 NOTE — Progress Notes (Signed)
Low-risk OB appointment [id number] [redacted]w[redacted]d Estimated Date of Delivery: 04/09/16 BP 110/60   Pulse 100   Wt 146 lb 8 oz (66.5 kg)   LMP 07/10/2015   BMI 26.80 kg/m   BP, weight, and urine reviewed.  Refer to obstetrical flow sheet for FH & FHR.  Reports good fm.  Denies regular uc's, lof, vb, or uti s/s. Increased vag d/c 'for awhile, no odor/itching/irritation, gushed yesterday- but was creamy d/c, no watery d/c.  SSE: cx closed, no pooling, no change w/ valsalva, fern/nitrazine neg, wet prep normal. Will check gc/ct.  Reviewed ptl s/s, fm. Plan:  Continue routine obstetrical care  F/U in 4wks for OB appointment and pn2

## 2015-12-14 NOTE — Patient Instructions (Signed)
You will have your sugar test next visit.  Please do not eat or drink anything after midnight the night before you come, not even water.  You will be here for at least two hours.     Call the office [phone]) or go to [facility] if:  You begin to have strong, frequent contractions  Your water breaks.  Sometimes it is a big gush of fluid, sometimes it is just a trickle that keeps getting your panties wet or running down your legs  You have vaginal bleeding.  It is normal to have a small amount of spotting if your cervix was checked.   You don't feel your baby moving like normal.  If you don't, get you something to eat and drink and lay down and focus on feeling your baby move.   If your baby is still not moving like normal, you should call the office or go to [place] [facility] Pregnancy The second trimester is from week 13 through week 28, months 4 through 6. The second trimester is often a time when you feel your best. Your body has also adjusted to being pregnant, and you begin to feel better physically. Usually, morning sickness has lessened or quit completely, you may have more energy, and you may have an increase in appetite. The second trimester is also a time when the fetus is growing rapidly. At the end of the sixth month, the fetus is about 9 inches long and weighs about 1 pounds. You will likely begin to feel the baby move (quickening) between 18 and 20 weeks of the pregnancy. BODY CHANGES Your body goes through many changes during pregnancy. The changes vary from woman to woman.   Your weight will continue to increase. You will notice your lower abdomen bulging out.  You may begin to get stretch marks on your hips, abdomen, and breasts.  You may develop headaches that can be relieved by medicines approved by your health care provider.  You may urinate more often because the fetus is pressing on your bladder.  You may develop or continue to have  heartburn as a result of your pregnancy.  You may develop constipation because certain hormones are causing the muscles that push waste through your intestines to slow down.  You may develop hemorrhoids or swollen, bulging veins (varicose veins).  You may have back pain because of the weight gain and pregnancy hormones relaxing your joints between the bones in your pelvis and as a result of a shift in weight and the muscles that support your balance.  Your breasts will continue to grow and be tender.  Your gums may bleed and may be sensitive to brushing and flossing.  Dark spots or blotches (chloasma, mask of pregnancy) may develop on your face. This will likely fade after the baby is born.  A dark line from your belly button to the pubic area (linea nigra) may appear. This will likely fade after the baby is born.  You may have changes in your hair. These can include thickening of your hair, rapid growth, and changes in texture. Some women also have hair loss during or after pregnancy, or hair that feels dry or thin. Your hair will most likely return to normal after your baby is born. WHAT TO EXPECT AT YOUR PRENATAL VISITS During a routine prenatal visit:  You will be weighed to make sure you and the fetus are growing normally.  Your blood pressure will be taken.  Your abdomen will be measured to track your baby's growth.  The fetal heartbeat will be listened to.  Any test results from the previous visit will be discussed. Your health care provider may ask you:  How you are feeling.  If you are feeling the baby move.  If you have had any abnormal symptoms, such as leaking fluid, bleeding, severe headaches, or abdominal cramping.  If you have any questions. Other tests that may be performed during your second trimester include:  Blood tests that check for:  Low iron levels (anemia).  Gestational diabetes (between 24 and 28 weeks).  Rh antibodies.  Urine tests to check  for infections, diabetes, or protein in the urine.  An ultrasound to confirm the proper growth and development of the baby.  An amniocentesis to check for possible genetic problems.  Fetal screens for spina bifida and Down syndrome. HOME CARE INSTRUCTIONS   Avoid all smoking, herbs, alcohol, and unprescribed drugs. These chemicals affect the formation and growth of the baby.  Follow your health care provider's instructions regarding medicine use. There are medicines that are either safe or unsafe to take during pregnancy.  Exercise only as directed by your health care provider. Experiencing uterine cramps is a good sign to stop exercising.  Continue to eat regular, healthy meals.  Wear a good support bra for breast tenderness.  Do not use hot tubs, steam rooms, or saunas.  Wear your seat belt at all times when driving.  Avoid raw meat, uncooked cheese, cat litter boxes, and soil used by cats. These carry germs that can cause birth defects in the baby.  Take your prenatal vitamins.  Try taking a stool softener (if your health care provider approves) if you develop constipation. Eat more high-fiber foods, such as fresh vegetables or fruit and whole grains. Drink plenty of fluids to keep your urine clear or pale yellow.  Take warm sitz baths to soothe any pain or discomfort caused by hemorrhoids. Use hemorrhoid cream if your health care provider approves.  If you develop varicose veins, wear support hose. Elevate your feet for 15 minutes, 3-4 times a day. Limit salt in your diet.  Avoid heavy lifting, wear low heel shoes, and practice good posture.  Rest with your legs elevated if you have leg cramps or low back pain.  Visit your dentist if you have not gone yet during your pregnancy. Use a soft toothbrush to brush your teeth and be gentle when you floss.  A sexual relationship may be continued unless your health care provider directs you otherwise.  Continue to go to all your  prenatal visits as directed by your health care provider. SEEK MEDICAL CARE IF:   You have dizziness.  You have mild pelvic cramps, pelvic pressure, or nagging pain in the abdominal area.  You have persistent nausea, vomiting, or diarrhea.  You have a bad smelling vaginal discharge.  You have pain with urination. SEEK IMMEDIATE MEDICAL CARE IF:   You have a fever.  You are leaking fluid from your vagina.  You have spotting or bleeding from your vagina.  You have severe abdominal cramping or pain.  You have rapid weight gain or loss.  You have shortness of breath with chest pain.  You notice sudden or extreme swelling of your face, hands, ankles, feet, or legs.  You have not felt your baby move in over an hour.  You have severe headaches that do not go away with medicine.  You have vision changes.  Document Released: 12/28/2000 Document Revised: 01/08/2013 Document Reviewed: 03/06/2012 [employer] Patient Information 2015 [employer]. This information is not intended to replace advice given to you by your health care provider. Make sure you discuss any questions you have with your health care provider.     

## 2015-12-16 LAB — GC/CHLAMYDIA PROBE AMP
Chlamydia trachomatis, NAA: NEGATIVE
Neisseria gonorrhoeae by PCR: NEGATIVE

## 2016-01-13 ENCOUNTER — Ambulatory Visit (INDEPENDENT_AMBULATORY_CARE_PROVIDER_SITE_OTHER): Payer: BLUE CROSS/BLUE SHIELD | Admitting: Advanced Practice Midwife

## 2016-01-13 ENCOUNTER — Encounter: Payer: Self-pay | Admitting: Advanced Practice Midwife

## 2016-01-13 ENCOUNTER — Other Ambulatory Visit: Payer: BLUE CROSS/BLUE SHIELD

## 2016-01-13 VITALS — BP 102/70 | HR 80 | Wt 146.3 lb

## 2016-01-13 DIAGNOSIS — Z1389 Encounter for screening for other disorder: Secondary | ICD-10-CM

## 2016-01-13 DIAGNOSIS — Z131 Encounter for screening for diabetes mellitus: Secondary | ICD-10-CM

## 2016-01-13 DIAGNOSIS — Z331 Pregnant state, incidental: Secondary | ICD-10-CM

## 2016-01-13 DIAGNOSIS — Z3482 Encounter for supervision of other normal pregnancy, second trimester: Secondary | ICD-10-CM

## 2016-01-13 DIAGNOSIS — Z3483 Encounter for supervision of other normal pregnancy, third trimester: Secondary | ICD-10-CM

## 2016-01-13 DIAGNOSIS — Z3A28 28 weeks gestation of pregnancy: Secondary | ICD-10-CM

## 2016-01-13 LAB — POCT URINALYSIS DIPSTICK
Glucose, UA: NEGATIVE
Ketones, UA: NEGATIVE
Leukocytes, UA: NEGATIVE
NITRITE UA: NEGATIVE
RBC UA: NEGATIVE

## 2016-01-13 MED ORDER — AMOXICILLIN 500 MG PO CAPS: 500.0000 mg | ORAL_CAPSULE | Freq: Three times a day (TID) | ORAL | 0 refills | Status: DC

## 2016-01-13 NOTE — Progress Notes (Signed)
[id number] [redacted]w[redacted]d Estimated Date of Delivery: 04/09/16  Blood pressure 102/70, pulse 80, weight 146 lb 4.8 oz (66.4 kg), last menstrual period 07/10/2015.   BP weight and urine results all reviewed and noted.  Please refer to the obstetrical flow sheet for the fundal height and fetal heart rate documentation:  Patient reports good fetal movement, denies any bleeding and no rupture of membranes symptoms or regular contractions. Patient has what sounds like some hypotensive episodes. Tips given (try support hose).  All questions were answered.  No orders of the defined types were placed in this encounter.   Plan:  Continued routine obstetrical care, PN2 today  Return in about 3 weeks (around 02/03/2016) for LROB.

## 2016-01-13 NOTE — Patient Instructions (Signed)
Third Trimester of Pregnancy The third trimester is from week 29 through week 40 (months 7 through 9). The third trimester is a time when the unborn baby (fetus) is growing rapidly. At the end of the ninth month, the fetus is about 20 inches in length and weighs 6-10 pounds. Body changes during your third trimester Your body goes through many changes during pregnancy. The changes vary from woman to woman. During the third trimester:  Your weight will continue to increase. You can expect to gain 25-35 pounds (11-16 kg) by the end of the pregnancy.  You may begin to get stretch marks on your hips, abdomen, and breasts.  You may urinate more often because the fetus is moving lower into your pelvis and pressing on your bladder.  You may develop or continue to have heartburn. This is caused by increased hormones that slow down muscles in the digestive tract.  You may develop or continue to have constipation because increased hormones slow digestion and cause the muscles that push waste through your intestines to relax.  You may develop hemorrhoids. These are swollen veins (varicose veins) in the rectum that can itch or be painful.  You may develop swollen, bulging veins (varicose veins) in your legs.  You may have increased body aches in the pelvis, back, or thighs. This is due to weight gain and increased hormones that are relaxing your joints.  You may have changes in your hair. These can include thickening of your hair, rapid growth, and changes in texture. Some women also have hair loss during or after pregnancy, or hair that feels dry or thin. Your hair will most likely return to normal after your baby is born.  Your breasts will continue to grow and they will continue to become tender. A yellow fluid (colostrum) may leak from your breasts. This is the first milk you are producing for your baby.  Your belly button may stick out.  You may notice more swelling in your hands, face, or  ankles.  You may have increased tingling or numbness in your hands, arms, and legs. The skin on your belly may also feel numb.  You may feel short of breath because of your expanding uterus.  You may have more problems sleeping. This can be caused by the size of your belly, increased need to urinate, and an increase in your body's metabolism.  You may notice the fetus "dropping," or moving lower in your abdomen.  You may have increased vaginal discharge.  Your cervix becomes thin and soft (effaced) near your due date. What to expect at prenatal visits You will have prenatal exams every 2 weeks until week 36. Then you will have weekly prenatal exams. During a routine prenatal visit:  You will be weighed to make sure you and the fetus are growing normally.  Your blood pressure will be taken.  Your abdomen will be measured to track your baby's growth.  The fetal heartbeat will be listened to.  Any test results from the previous visit will be discussed.  You may have a cervical check near your due date to see if you have effaced. At around 36 weeks, your health care provider will check your cervix. At the same time, your health care provider will also perform a test on the secretions of the vaginal tissue. This test is to determine if a type of bacteria, Group B streptococcus, is present. Your health care provider will explain this further. Your health care provider may ask you:    What your birth plan is.  How you are feeling.  If you are feeling the baby move.  If you have had any abnormal symptoms, such as leaking fluid, bleeding, severe headaches, or abdominal cramping.  If you are using any tobacco products, including cigarettes, chewing tobacco, and electronic cigarettes.  If you have any questions. Other tests or screenings that may be performed during your third trimester include:  Blood tests that check for low iron levels (anemia).  Fetal testing to check the health,  activity level, and growth of the fetus. Testing is done if you have certain medical conditions or if there are problems during the pregnancy.  Nonstress test (NST). This test checks the health of your baby to make sure there are no signs of problems, such as the baby not getting enough oxygen. During this test, a belt is placed around your belly. The baby is made to move, and its heart rate is monitored during movement. What is false labor? False labor is a condition in which you feel small, irregular tightenings of the muscles in the womb (contractions) that eventually go away. These are called Braxton Hicks contractions. Contractions may last for hours, days, or even weeks before true labor sets in. If contractions come at regular intervals, become more frequent, increase in intensity, or become painful, you should see your health care provider. What are the signs of labor?  Abdominal cramps.  Regular contractions that start at 10 minutes apart and become stronger and more frequent with time.  Contractions that start on the top of the uterus and spread down to the lower abdomen and back.  Increased pelvic pressure and dull back pain.  A watery or bloody mucus discharge that comes from the vagina.  Leaking of amniotic fluid. This is also known as your "water breaking." It could be a slow trickle or a gush. Let your doctor know if it has a color or strange odor. If you have any of these signs, call your health care provider right away, even if it is before your due date. Follow these instructions at home: Eating and drinking  Continue to eat regular, healthy meals.  Do not eat:  Raw meat or meat spreads.  Unpasteurized milk or cheese.  Unpasteurized juice.  Store-made salad.  Refrigerated smoked seafood.  Hot dogs or deli meat, unless they are piping hot.  More than 6 ounces of albacore tuna a week.  Shark, swordfish, king mackerel, or tile fish.  Store-made salads.  Raw  sprouts, such as mung bean or alfalfa sprouts.  Take prenatal vitamins as told by your health care provider.  Take 1000 mg of calcium daily as told by your health care provider.  If you develop constipation:  Take over-the-counter or prescription medicines.  Drink enough fluid to keep your urine clear or pale yellow.  Eat foods that are high in fiber, such as fresh fruits and vegetables, whole grains, and beans.  Limit foods that are high in fat and processed sugars, such as fried and sweet foods. Activity  Exercise only as directed by your health care provider. Healthy pregnant women should aim for 2 hours and 30 minutes of moderate exercise per week. If you experience any pain or discomfort while exercising, stop.  Avoid heavy lifting.  Do not exercise in extreme heat or humidity, or at high altitudes.  Wear low-heel, comfortable shoes.  Practice good posture.  Do not travel far distances unless it is absolutely necessary and only with the approval   of your health care provider.  Wear your seat belt at all times while in a car, on a bus, or on a plane.  Take frequent breaks and rest with your legs elevated if you have leg cramps or low back pain.  Do not use hot tubs, steam rooms, or saunas.  You may continue to have sex unless your health care provider tells you otherwise. Lifestyle  Do not use any products that contain nicotine or tobacco, such as cigarettes and e-cigarettes. If you need help quitting, ask your health care provider.  Do not drink alcohol.  Do not use any medicinal herbs or unprescribed drugs. These chemicals affect the formation and growth of the baby.  If you develop varicose veins:  Wear support pantyhose or compression stockings as told by your healthcare provider.  Elevate your feet for 15 minutes, 3-4 times a day.  Wear a supportive maternity bra to help with breast tenderness. General instructions  Take over-the-counter and prescription  medicines only as told by your health care provider. There are medicines that are either safe or unsafe to take during pregnancy.  Take warm sitz baths to soothe any pain or discomfort caused by hemorrhoids. Use hemorrhoid cream or witch hazel if your health care provider approves.  Avoid cat litter boxes and soil used by cats. These carry germs that can cause birth defects in the baby. If you have a cat, ask someone to clean the litter box for you.  To prepare for the arrival of your baby:  Take prenatal classes to understand, practice, and ask questions about the labor and delivery.  Make a trial run to the hospital.  Visit the hospital and tour the maternity area.  Arrange for maternity or paternity leave through employers.  Arrange for family and friends to take care of pets while you are in the hospital.  Purchase a rear-facing car seat and make sure you know how to install it in your car.  Pack your hospital bag.  Prepare the baby's nursery. Make sure to remove all pillows and stuffed animals from the baby's crib to prevent suffocation.  Visit your dentist if you have not gone during your pregnancy. Use a soft toothbrush to brush your teeth and be gentle when you floss.  Keep all prenatal follow-up visits as told by your health care provider. This is important. Contact a health care provider if:  You are unsure if you are in labor or if your water has broken.  You become dizzy.  You have mild pelvic cramps, pelvic pressure, or nagging pain in your abdominal area.  You have lower back pain.  You have persistent nausea, vomiting, or diarrhea.  You have an unusual or bad smelling vaginal discharge.  You have pain when you urinate. Get help right away if:  You have a fever.  You are leaking fluid from your vagina.  You have spotting or bleeding from your vagina.  You have severe abdominal pain or cramping.  You have rapid weight loss or weight gain.  You have  shortness of breath with chest pain.  You notice sudden or extreme swelling of your face, hands, ankles, feet, or legs.  Your baby makes fewer than 10 movements in 2 hours.  You have severe headaches that do not go away with medicine.  You have vision changes. Summary  The third trimester is from week 29 through week 40, months 7 through 9. The third trimester is a time when the unborn baby (fetus)   is growing rapidly.  During the third trimester, your discomfort may increase as you and your baby continue to gain weight. You may have abdominal, leg, and back pain, sleeping problems, and an increased need to urinate.  During the third trimester your breasts will keep growing and they will continue to become tender. A yellow fluid (colostrum) may leak from your breasts. This is the first milk you are producing for your baby.  False labor is a condition in which you feel small, irregular tightenings of the muscles in the womb (contractions) that eventually go away. These are called Braxton Hicks contractions. Contractions may last for hours, days, or even weeks before true labor sets in.  Signs of labor can include: abdominal cramps; regular contractions that start at 10 minutes apart and become stronger and more frequent with time; watery or bloody mucus discharge that comes from the vagina; increased pelvic pressure and dull back pain; and leaking of amniotic fluid. This information is not intended to replace advice given to you by your health care provider. Make sure you discuss any questions you have with your health care provider. Document Released: 12/28/2000 Document Revised: 06/11/2015 Document Reviewed: 03/06/2012 Elsevier Interactive Patient Education  2017 [employer]  

## 2016-01-14 LAB — CBC
HEMATOCRIT: 34.6 % (ref 34.0–46.6)
HEMOGLOBIN: 11.9 g/dL (ref 11.1–15.9)
MCH: 30.7 pg (ref 26.6–33.0)
MCHC: 34.4 g/dL (ref 31.5–35.7)
MCV: 89 fL (ref 79–97)
Platelets: 186 10*3/uL (ref 150–379)
RBC: 3.87 x10E6/uL (ref 3.77–5.28)
RDW: 13.7 % (ref 12.3–15.4)
WBC: 7.6 10*3/uL (ref 3.4–10.8)

## 2016-01-14 LAB — GLUCOSE TOLERANCE, 2 HOURS W/ 1HR
GLUCOSE, 1 HOUR: 97 mg/dL (ref 65–179)
GLUCOSE, 2 HOUR: 81 mg/dL (ref 65–152)
Glucose, Fasting: 82 mg/dL (ref 65–91)

## 2016-01-14 LAB — ANTIBODY SCREEN: ANTIBODY SCREEN: NEGATIVE

## 2016-01-14 LAB — RPR: RPR: NONREACTIVE

## 2016-01-14 LAB — HIV ANTIBODY (ROUTINE TESTING W REFLEX): HIV SCREEN 4TH GENERATION: NONREACTIVE

## 2016-01-18 NOTE — L&D Delivery Note (Signed)
Delivery Note At 10:59 PM a viable female was delivered via  (Presentation: direct OA).  APGAR: 9, 9; weight: pending.  Infant dried and lifted to pt's abd. Cord clamped and cut by family member.  Hospital cord blood sample unable to be collected (clotted). Placenta status: spont , intact .  Cord: 3 vessel  Anesthesia:  Epidural Episiotomy:  None Lacerations:  None Est. Blood Loss (mL):  100  Mom to postpartum.  Baby to Couplet care / Skin to Skin.  Serita Grammes CNM 04/10/2016, 11:26 PM

## 2016-01-22 ENCOUNTER — Ambulatory Visit (INDEPENDENT_AMBULATORY_CARE_PROVIDER_SITE_OTHER): Payer: BLUE CROSS/BLUE SHIELD | Admitting: Obstetrics and Gynecology

## 2016-01-22 ENCOUNTER — Telehealth: Payer: Self-pay | Admitting: *Deleted

## 2016-01-22 ENCOUNTER — Encounter: Payer: Self-pay | Admitting: Obstetrics and Gynecology

## 2016-01-22 VITALS — BP 105/59 | HR 98 | Wt 149.8 lb

## 2016-01-22 DIAGNOSIS — O4703 False labor before 37 completed weeks of gestation, third trimester: Secondary | ICD-10-CM

## 2016-01-22 DIAGNOSIS — Z3A29 29 weeks gestation of pregnancy: Secondary | ICD-10-CM

## 2016-01-22 DIAGNOSIS — Z3482 Encounter for supervision of other normal pregnancy, second trimester: Secondary | ICD-10-CM

## 2016-01-22 DIAGNOSIS — Z331 Pregnant state, incidental: Secondary | ICD-10-CM

## 2016-01-22 DIAGNOSIS — Z1389 Encounter for screening for other disorder: Secondary | ICD-10-CM

## 2016-01-22 LAB — POCT URINALYSIS DIPSTICK
Glucose, UA: NEGATIVE
KETONES UA: NEGATIVE
Leukocytes, UA: NEGATIVE
Nitrite, UA: NEGATIVE
PROTEIN UA: NEGATIVE
RBC UA: NEGATIVE

## 2016-01-22 NOTE — Progress Notes (Addendum)
Patient ID: Deanna Hughes, female   DOB: 24-Aug-1990, 24 y.o.   MRN: [id number] [redacted]w[redacted]d Estimated Date of Delivery: 04/09/16 [facility] APPT Patient reports good fetal movement, denies any bleeding and no rupture of membranes symptoms. Patient complaints: possible contractions. Pt notes that she left work to be evaluated for her symptoms. Pt hasn't tried any medications for the relief of her symptoms. Pt denies any other symptoms.   Blood pressure (!) 105/59, pulse 98, weight 149 lb 12.8 oz (67.9 kg), last menstrual period 07/10/2015. refer to the ob flow sheet for FH and FHR, also BP, Wt, Urine results:notable for negative                          Physical Examination:  General appearance - alert, well appearing, and in no distress Abdomen - FH 26 cm                    -FHR 135 soft, nontender, nondistended, no masses or organomegaly VULVA: normal appearing vulva with no masses, tenderness or lesions,  VAGINA: normal appearing vagina with normal color and discharge, no lesions,  CERVIX: long, closed, with moderate cervical discharge. Discharge pH is 5.0 normal appearing cervix without or lesions.                                            Questions were answered. Assessment: LROB [id number] @ [redacted]w[redacted]d  Plan:   1. FFN collected NEGATIVE  2. Continued routine obstetrical care.  3. return to work in 3 days.  F/u in 4 weeks for routine OB  By signing my name below, I, Soijett Blue, attest that this documentation has been prepared under the direction and in the presence of Jonnie Kind, MD. Electronically Signed: Soijett Blue, ED Scribe. 01/22/16. 11:00 AM.   I personally performed the services described in this documentation, which was SCRIBED in my presence. The recorded information has been reviewed and considered accurate. It has been edited as necessary during review. Jonnie Kind, MD

## 2016-01-22 NOTE — Telephone Encounter (Signed)
Patient called stating she had a "gush" of fluid 1 time this morning that could have been discharge. She is not currently leaking or bleeding. She is having sharp pains radiating to her vagina. I spoke with Gertie Exon, CNM who stated patient needed to be seen. Patient transferred to make an appointment.

## 2016-01-23 LAB — FETAL FIBRONECTIN: Fetal Fibronectin: NEGATIVE

## 2016-01-26 DIAGNOSIS — Z029 Encounter for administrative examinations, unspecified: Secondary | ICD-10-CM

## 2016-01-29 ENCOUNTER — Ambulatory Visit (INDEPENDENT_AMBULATORY_CARE_PROVIDER_SITE_OTHER): Payer: BLUE CROSS/BLUE SHIELD | Admitting: Obstetrics and Gynecology

## 2016-01-29 ENCOUNTER — Telehealth: Payer: Self-pay | Admitting: Obstetrics & Gynecology

## 2016-01-29 ENCOUNTER — Encounter: Payer: Self-pay | Admitting: Obstetrics and Gynecology

## 2016-01-29 ENCOUNTER — Telehealth: Payer: Self-pay | Admitting: Obstetrics and Gynecology

## 2016-01-29 VITALS — BP 89/51 | HR 98 | Wt 152.5 lb

## 2016-01-29 DIAGNOSIS — Z3A30 30 weeks gestation of pregnancy: Secondary | ICD-10-CM

## 2016-01-29 DIAGNOSIS — Z3483 Encounter for supervision of other normal pregnancy, third trimester: Secondary | ICD-10-CM

## 2016-01-29 DIAGNOSIS — Z3403 Encounter for supervision of normal first pregnancy, third trimester: Secondary | ICD-10-CM

## 2016-01-29 DIAGNOSIS — Z1389 Encounter for screening for other disorder: Secondary | ICD-10-CM

## 2016-01-29 DIAGNOSIS — Z331 Pregnant state, incidental: Secondary | ICD-10-CM

## 2016-01-29 LAB — POCT URINALYSIS DIPSTICK
GLUCOSE UA: NEGATIVE
Ketones, UA: NEGATIVE
NITRITE UA: NEGATIVE
Protein, UA: NEGATIVE

## 2016-01-29 NOTE — Progress Notes (Signed)
Patient ID: Deanna Hughes, female   DOB: 03-02-1990, 26 y.o.   MRN: [id number]  [id number]  Estimated Date of Delivery: 04/09/16 LROB [redacted]w[redacted]d  Blood pressure (!) 89/51, pulse 98, weight 152 lb 8 oz (69.2 kg), last menstrual period 07/10/2015.    Urine results: notable for trace blood, 2+ leukocytes   Chief Complaint  Patient presents with  . Routine Prenatal Visit    feet swelling; bruises on left ankle; hernia    Patient complaints: occasional pelvic pain that radiates to the vagina. She also notes some mild swelling, pain and discoloration to her ankles that is not itchy. She denies frequency, urgency or dysuria.   Patient reports good fetal movement. She denies any bleeding, rupture of membranes, or regular contractions.   Refer to the ob flow sheet for FH and FHR.    Physical Examination: General appearance - alert, well appearing, and in no distress                                      Abdomen - FH 31 cm,                                                         -FHR 146 bpm                                                         soft, nontender, nondistended, no masses or organomegaly                                            Questions were answered. Assessment: LROB [id number] @ [redacted]w[redacted]d Psoriasis left ankle extensor surface    Plan:   1. Continued routine obstetrical care 2. Pt to try OTC 1% Hydrocortisone   F/u in 3 weeks for routine prenatal care    By signing my name below, I, Hansel Feinstein, attest that this documentation has been prepared under the direction and in the presence of Jonnie Kind, MD. Electronically Signed: Hansel Feinstein, ED Scribe. 01/29/16. 9:45 AM.  I personally performed the services described in this documentation, which was SCRIBED in my presence. The recorded information has been reviewed and considered accurate. It has been edited as necessary during review. Jonnie Kind, MD

## 2016-02-03 ENCOUNTER — Encounter: Payer: BLUE CROSS/BLUE SHIELD | Admitting: Advanced Practice Midwife

## 2016-02-07 ENCOUNTER — Encounter (HOSPITAL_COMMUNITY): Payer: Self-pay | Admitting: *Deleted

## 2016-02-07 ENCOUNTER — Inpatient Hospital Stay (HOSPITAL_COMMUNITY)
Admission: AD | Admit: 2016-02-07 | Discharge: 2016-02-07 | Disposition: A | Discharge status: Home / Self Care | Payer: BLUE CROSS/BLUE SHIELD | Source: Ambulatory Visit | Attending: Obstetrics & Gynecology | Admitting: Obstetrics & Gynecology

## 2016-02-07 DIAGNOSIS — N898 Other specified noninflammatory disorders of vagina: Secondary | ICD-10-CM | POA: Diagnosis present

## 2016-02-07 DIAGNOSIS — O26893 Other specified pregnancy related conditions, third trimester: Secondary | ICD-10-CM | POA: Diagnosis not present

## 2016-02-07 DIAGNOSIS — Z825 Family history of asthma and other chronic lower respiratory diseases: Secondary | ICD-10-CM | POA: Insufficient documentation

## 2016-02-07 DIAGNOSIS — R102 Pelvic and perineal pain: Secondary | ICD-10-CM | POA: Diagnosis not present

## 2016-02-07 DIAGNOSIS — N949 Unspecified condition associated with female genital organs and menstrual cycle: Secondary | ICD-10-CM

## 2016-02-07 DIAGNOSIS — Z3A31 31 weeks gestation of pregnancy: Secondary | ICD-10-CM | POA: Diagnosis not present

## 2016-02-07 LAB — WET PREP, GENITAL
Clue Cells Wet Prep HPF POC: NONE SEEN
Sperm: NONE SEEN
Trich, Wet Prep: NONE SEEN
Yeast Wet Prep HPF POC: NONE SEEN

## 2016-02-07 LAB — URINALYSIS, ROUTINE W REFLEX MICROSCOPIC
Bilirubin Urine: NEGATIVE
GLUCOSE, UA: NEGATIVE mg/dL
HGB URINE DIPSTICK: NEGATIVE
KETONES UR: NEGATIVE mg/dL
Leukocytes, UA: NEGATIVE
Nitrite: NEGATIVE
PROTEIN: NEGATIVE mg/dL
Specific Gravity, Urine: 1.015 (ref 1.005–1.030)
pH: 7 (ref 5.0–8.0)

## 2016-02-07 NOTE — MAU Note (Signed)
C/o vaginal discharge for past week; c/o vaginal swelling for past 3 days; c/o vaginal pressure also for past week

## 2016-02-07 NOTE — MAU Provider Note (Signed)
History   [id number] @ 31 wks in with c/o vaginal discharge x 3 days yellow in nature, pelvic pressure x 1 wk, and swelling x 2 wks. Denies ROM or vag bleeding, states good fetal movement.  CSN: [id number]  Arrival date & time 02/07/16  1837   None     Chief Complaint  Patient presents with  . Vaginal Discharge  . Vaginal Pain  . Groin Swelling    HPI  Past Medical History:  Diagnosis Date  . BV (bacterial vaginosis) 04/02/2015  . Chlamydia    treated 6/6  . Chronic back pain   . Chronic headaches   . Contraceptive management 01/25/2013  . Depression   . Encounter for Nexplanon removal 02/27/2015  . Irregular bleeding 06/03/2013  . Miscarriage 03/27/2013  . Nexplanon insertion 04/11/2013   nexplanon inserted 04/11/13 remove 3/32/18  . Right sided abdominal pain   . Scoliosis   . Seasonal allergies 07/03/2012  . Sinus infection 08/27/2013  . Supervision of normal pregnancy 08/27/2015    [facility] Initiated Care at   7+5 weeks  FOB  Dating By  LMP  Pap  GC/CT Initial:                36+wks: Genetic Screen NT/IT:  CF screen  Anatomic [place]  Flu vaccine  Tdap Recommended ~ 28wks Glucose Screen  2 hr GBS  Feed Preference  Contraception  Circumcision  Childbirth Classes  Pediatrician    . Threatened abortion in early pregnancy 03/20/2013  . Vaginal discharge 10/03/2012  . Yeast infection 07/03/2012    Past Surgical History:  Procedure Laterality Date  . NO PAST SURGERIES      Family History  Problem Relation Age of Onset  . Asthma Mother   . Asthma Sister   . Asthma Brother   . Asthma Daughter   . Diabetes Maternal Grandmother   . Hypertension Maternal Grandmother   . Asthma Maternal Grandmother   . Heart disease Maternal Grandmother   . Stroke Maternal Grandmother   . Hypertension Maternal Grandfather   . Asthma Maternal Grandfather   . Heart disease Maternal Grandfather   . Asthma Son     Social History  Substance Use Topics  . Smoking status: Never Smoker  .  Smokeless tobacco: Never Used  . Alcohol use No    OB History    Gravida Para Term Preterm AB Living   5 2 2  0 2 2   SAB TAB Ectopic Multiple Live Births   2 0 0 0 2      Review of Systems  Constitutional: Negative.   HENT: Negative.   Eyes: Negative.   Respiratory: Negative.   Cardiovascular: Negative.   Gastrointestinal: Negative.   Endocrine: Negative.   Genitourinary: Positive for vaginal discharge.  Musculoskeletal: Negative.   Skin: Negative.     Allergies  Patient has no known allergies.  Home Medications    BP 106/63   Pulse 108   Temp 98.6 F (37 C) (Oral)   Resp 18   LMP 07/10/2015   Physical Exam  Constitutional: She is oriented to person, place, and time. She appears well-developed and well-nourished.  HENT:  Head: Normocephalic.  Eyes: Pupils are equal, round, and reactive to light.  Neck: Normal range of motion.  Cardiovascular: Normal rate, regular rhythm, normal heart sounds and intact distal pulses.   Pulmonary/Chest: Effort normal and breath sounds normal.  Abdominal: Soft. Bowel sounds are normal.  Genitourinary: Vaginal discharge found.  Musculoskeletal: Normal range of motion.  Neurological: She is alert and oriented to person, place, and time. She has normal reflexes.  Skin: Skin is warm and dry.  Psychiatric: She has a normal mood and affect. Her behavior is normal. Judgment and thought content normal.    MAU Course  Procedures (including critical care time)  Labs Reviewed  WET PREP, GENITAL  URINALYSIS, ROUTINE W REFLEX MICROSCOPIC  GC/CHLAMYDIA PROBE AMP ([facility]) NOT AT [facility]   No results found.   No diagnosis found.    MDM  Wet prep . GC and chla done. SVE cl/th/post/high. FHR pattern reassuring. No uc's. Will d/c home

## 2016-02-08 LAB — GC/CHLAMYDIA PROBE AMP (~~LOC~~) NOT AT ARMC
Chlamydia: NEGATIVE
NEISSERIA GONORRHEA: NEGATIVE

## 2016-02-22 ENCOUNTER — Encounter: Payer: Self-pay | Admitting: Women's Health

## 2016-02-22 ENCOUNTER — Ambulatory Visit (INDEPENDENT_AMBULATORY_CARE_PROVIDER_SITE_OTHER): Payer: BLUE CROSS/BLUE SHIELD | Admitting: Women's Health

## 2016-02-22 VITALS — BP 100/54 | HR 92 | Wt 154.0 lb

## 2016-02-22 DIAGNOSIS — Z1389 Encounter for screening for other disorder: Secondary | ICD-10-CM

## 2016-02-22 DIAGNOSIS — Z3483 Encounter for supervision of other normal pregnancy, third trimester: Secondary | ICD-10-CM

## 2016-02-22 DIAGNOSIS — Z3A33 33 weeks gestation of pregnancy: Secondary | ICD-10-CM

## 2016-02-22 DIAGNOSIS — Z331 Pregnant state, incidental: Secondary | ICD-10-CM

## 2016-02-22 LAB — POCT URINALYSIS DIPSTICK
Glucose, UA: NEGATIVE
Ketones, UA: NEGATIVE
Leukocytes, UA: NEGATIVE
Nitrite, UA: NEGATIVE
Protein, UA: NEGATIVE
RBC UA: NEGATIVE

## 2016-02-22 NOTE — Patient Instructions (Addendum)
Call the office [phone]) or go to [facility] if:  You begin to have strong, frequent contractions  Your water breaks.  Sometimes it is a big gush of fluid, sometimes it is just a trickle that keeps getting your panties wet or running down your legs  You have vaginal bleeding.  It is normal to have a small amount of spotting if your cervix was checked.   You don't feel your baby moving like normal.  If you don't, get you something to eat and drink and lay down and focus on feeling your baby move.  You should feel at least 10 movements in 2 hours.  If you don't, you should call the office or go to [facility].   Tdap Vaccine  It is recommended that you get the Tdap vaccine during the third trimester of EACH pregnancy to help protect your baby from getting pertussis (whooping cough)  27-36 weeks is the BEST time to do this so that you can pass the protection on to your baby. During pregnancy is better than after pregnancy, but if you are unable to get it during pregnancy it will be offered at the hospital.   You can get this vaccine at the health department or your family doctor  Everyone who will be around your baby should also be up-to-date on their vaccines. Adults (who are not pregnant) only need 1 dose of Tdap during adulthood.        Preterm Labor and Birth Information The normal length of a pregnancy is 39-41 weeks. Preterm labor is when labor starts before 37 completed weeks of pregnancy. What are the risk factors for preterm labor? Preterm labor is more likely to occur in women who:  Have certain infections during pregnancy such as a bladder infection, sexually transmitted infection, or infection inside the uterus (chorioamnionitis).  Have a shorter-than-normal cervix.  Have gone into preterm labor before.  Have had surgery on their cervix.  Are younger than age 31 or older than age 15.  Are African American.  Are pregnant with twins or multiple babies  (multiple gestation).  Take street drugs or smoke while pregnant.  Do not gain enough weight while pregnant.  Became pregnant shortly after having been pregnant. What are the symptoms of preterm labor? Symptoms of preterm labor include:  Cramps similar to those that can happen during a menstrual period. The cramps may happen with diarrhea.  Pain in the abdomen or lower back.  Regular uterine contractions that may feel like tightening of the abdomen.  A feeling of increased pressure in the pelvis.  Increased watery or bloody mucus discharge from the vagina.  Water breaking (ruptured amniotic sac). Why is it important to recognize signs of preterm labor? It is important to recognize signs of preterm labor because babies who are born prematurely may not be fully developed. This can put them at an increased risk for:  Long-term (chronic) heart and lung problems.  Difficulty immediately after birth with regulating body systems, including blood sugar, body temperature, heart rate, and breathing rate.  Bleeding in the brain.  Cerebral palsy.  Learning difficulties.  Death. These risks are highest for babies who are born before 64 weeks of pregnancy. How is preterm labor treated? Treatment depends on the length of your pregnancy, your condition, and the health of your baby. It may involve:  Having a stitch (suture) placed in your cervix to prevent your cervix from opening too early (cerclage).  Taking or being given medicines, such  as:  Hormone medicines. These may be given early in pregnancy to help support the pregnancy.  Medicine to stop contractions.  Medicines to help mature the baby's lungs. These may be prescribed if the risk of delivery is high.  Medicines to prevent your baby from developing cerebral palsy. If the labor happens before 34 weeks of pregnancy, you may need to stay in the hospital. What should I do if I think I am in preterm labor? If you think that  you are going into preterm labor, call your health care provider right away. How can I prevent preterm labor in future pregnancies? To increase your chance of having a full-term pregnancy:  Do not use any tobacco products, such as cigarettes, chewing tobacco, and e-cigarettes. If you need help quitting, ask your health care provider.  Do not use street drugs or medicines that have not been prescribed to you during your pregnancy.  Talk with your health care provider before taking any herbal supplements, even if you have been taking them regularly.  Make sure you gain a healthy amount of weight during your pregnancy.  Watch for infection. If you think that you might have an infection, get it checked right away.  Make sure to tell your health care provider if you have gone into preterm labor before. This information is not intended to replace advice given to you by your health care provider. Make sure you discuss any questions you have with your health care provider. Document Released: 03/26/2003 Document Revised: 06/16/2015 Document Reviewed: 05/27/2015 Elsevier Interactive Patient Education  2017 [employer].

## 2016-02-22 NOTE — Progress Notes (Signed)
Low-risk OB appointment [id number] [redacted]w[redacted]d Estimated Date of Delivery: 04/09/16 BP (!) 100/54   Pulse 92   Wt 154 lb (69.9 kg)   LMP 07/10/2015   BMI 28.17 kg/m   BP, weight, and urine reviewed.  Refer to obstetrical flow sheet for FH & FHR.  Reports good fm.  Denies regular uc's, lof, vb, or uti s/s. Swelling in legs, bruising on Lt dorsum she thinks is r/t swelling. No edema noted today. Can get compression stockings if desired. Also reports vulvar swelling- denies abnormal d/c, itching/odor/irritation, discussed pelvic congestion, can try ice pack prn if uncomfortable.  Reviewed ptl s/s, fkc. Recommended Tdap at HD/PCP per CDC guidelines.  Plan:  Continue routine obstetrical care  F/U in 2wks for OB appointment

## 2016-03-07 ENCOUNTER — Ambulatory Visit (INDEPENDENT_AMBULATORY_CARE_PROVIDER_SITE_OTHER): Payer: BLUE CROSS/BLUE SHIELD | Admitting: Women's Health

## 2016-03-07 ENCOUNTER — Encounter: Payer: Self-pay | Admitting: Women's Health

## 2016-03-07 VITALS — BP 104/60 | HR 84 | Wt 156.0 lb

## 2016-03-07 DIAGNOSIS — Z331 Pregnant state, incidental: Secondary | ICD-10-CM

## 2016-03-07 DIAGNOSIS — Z3A35 35 weeks gestation of pregnancy: Secondary | ICD-10-CM

## 2016-03-07 DIAGNOSIS — Z1389 Encounter for screening for other disorder: Secondary | ICD-10-CM

## 2016-03-07 DIAGNOSIS — Z3483 Encounter for supervision of other normal pregnancy, third trimester: Secondary | ICD-10-CM

## 2016-03-07 LAB — POCT URINALYSIS DIPSTICK
Blood, UA: NEGATIVE
Glucose, UA: NEGATIVE
KETONES UA: NEGATIVE
NITRITE UA: NEGATIVE

## 2016-03-07 NOTE — Progress Notes (Signed)
Low-risk OB appointment [id number] [redacted]w[redacted]d Estimated Date of Delivery: 04/09/16 BP 104/60   Pulse 84   Wt 156 lb (70.8 kg)   LMP 07/10/2015   BMI 28.53 kg/m   BP, weight, and urine reviewed.  Refer to obstetrical flow sheet for FH & FHR.  Reports good fm.  Denies regular uc's, lof, vb, or uti s/s. Some cramping, Rt sciatica makes her leg give out, leg cramps Rt leg- discussed and gave printed info on all.  Reviewed ptl s/s, fkc. Plan:  Continue routine obstetrical care  F/U in 1wk2d for OB appointment and gbs

## 2016-03-07 NOTE — Patient Instructions (Addendum)
Tips to Help Leg Cramps  Increase dietary sources of calcium (milk, yogurt, cheese, leafy greens, seafood, legumes, and fruit) and magnesium (dark leafy greens, nuts, seeds, fish, beans, whole grains, avocados, yogurt, bananas, dried fruit, dark chocolate)  Spoonful of regular yellow mustard every night  Pickle juice  Magnesium supplement: 36mmol in the morning, 58mmol at night (can find in the vitamin aisle)  Dorsiflexion of foot: pointing your toes back towards your knee during the cramp      Call the office [phone]) or go to [facility] if:  You begin to have strong, frequent contractions  Your water breaks.  Sometimes it is a big gush of fluid, sometimes it is just a trickle that keeps getting your panties wet or running down your legs  You have vaginal bleeding.  It is normal to have a small amount of spotting if your cervix was checked.   You don't feel your baby moving like normal.  If you don't, get you something to eat and drink and lay down and focus on feeling your baby move.  You should feel at least 10 movements in 2 hours.  If you don't, you should call the office or go to [place] your health care provider which exercises are safe for you. Do exercises exactly as told by your health care provider and adjust them as directed. It is normal to feel mild stretching, pulling, tightness, or discomfort as you do these exercises, but you should stop right away if you feel sudden pain or your pain gets worse.Do not begin these exercises until told by your health care provider. Stretching and range of motion exercises These exercises warm up your muscles and joints and improve the movement and flexibility of your hips and your back. These exercises also help to relieve pain, numbness, and tingling. Exercise A: Sciatic nerve glide 6. Sit in a chair with your head facing down toward your chest. Place your hands behind your back. Let your  shoulders slump forward. 7. Slowly straighten one of your knees while you tilt your head back as if you are looking toward the ceiling. Only straighten your leg as far as you can without making your symptoms worse. 8. Hold for __________ seconds. 9. Slowly return to the starting position. 10. Repeat with your other leg. Repeat __________ times. Complete this exercise __________ times a day. Exercise B: Knee to chest with hip adduction and internal rotation 1. Lie on your back on a firm surface with both legs straight. 2. Bend one of your knees and move it up toward your chest until you feel a gentle stretch in your lower back and buttock. Then, move your knee toward the shoulder that is on the opposite side from your leg.  Hold your leg in this position by holding onto the front of your knee. 3. Hold for __________ seconds. 4. Slowly return to the starting position. 5. Repeat with your other leg. Repeat __________ times. Complete this exercise __________ times a day. Exercise C: Prone extension on elbows 1. Lie on your abdomen on a firm surface. A bed may be too soft for this exercise. 2. Prop yourself up on your elbows. 3. Use your arms to help lift your chest up until you feel a gentle stretch in your abdomen and your lower back.  This will place some of your body weight on your elbows. If this is uncomfortable, try stacking pillows under your chest.  Your hips  should stay down, against the surface that you are lying on. Keep your hip and back muscles relaxed. 4. Hold for __________ seconds. 5. Slowly relax your upper body and return to the starting position. Repeat __________ times. Complete this exercise __________ times a day. Strengthening exercises These exercises build strength and endurance in your back. Endurance is the ability to use your muscles for a long time, even after they get tired. Exercise D: Pelvic tilt 1. Lie on your back on a firm surface. Bend your knees and keep  your feet flat. 2. Tense your abdominal muscles. Tip your pelvis up toward the ceiling and flatten your lower back into the floor.  To help with this exercise, you may place a small towel under your lower back and try to push your back into the towel. 3. Hold for __________ seconds. 4. Let your muscles relax completely before you repeat this exercise. Repeat __________ times. Complete this exercise __________ times a day. Exercise E: Alternating arm and leg raises 1. Get on your hands and knees on a firm surface. If you are on a hard floor, you may want to use padding to cushion your knees, such as an exercise mat. 2. Line up your arms and legs. Your hands should be below your shoulders, and your knees should be below your hips. 3. Lift your left leg behind you. At the same time, raise your right arm and straighten it in front of you.  Do not lift your leg higher than your hip.  Do not lift your arm higher than your shoulder.  Keep your abdominal and back muscles tight.  Keep your hips facing the ground.  Do not arch your back.  Keep your balance carefully, and do not hold your breath. 4. Hold for __________ seconds. 5. Slowly return to the starting position and repeat with your right leg and your left arm. Repeat __________ times. Complete this exercise __________ times a day. Posture and body mechanics   Body mechanics refers to the movements and positions of your body while you do your daily activities. Posture is part of body mechanics. Good posture and healthy body mechanics can help to relieve stress in your body's tissues and joints. Good posture means that your spine is in its natural S-curve position (your spine is neutral), your shoulders are pulled back slightly, and your head is not tipped forward. The following are general guidelines for applying improved posture and body mechanics to your everyday activities. Standing   When standing, keep your spine neutral and your  feet about hip-width apart. Keep a slight bend in your knees. Your ears, shoulders, and hips should line up.  When you do a task in which you stand in one place for a long time, place one foot up on a stable object that is 2-4 inches (5-10 cm) high, such as a footstool. This helps keep your spine neutral. Sitting  When sitting, keep your spine neutral and keep your feet flat on the floor. Use a footrest, if necessary, and keep your thighs parallel to the floor. Avoid rounding your shoulders, and avoid tilting your head forward.  When working at a desk or a computer, keep your desk at a height where your hands are slightly lower than your elbows. Slide your chair under your desk so you are close enough to maintain good posture.  When working at a computer, place your monitor at a height where you are looking straight ahead and you do not have to  tilt your head forward or downward to look at the screen. Resting   When lying down and resting, avoid positions that are most painful for you.  If you have pain with activities such as sitting, bending, stooping, or squatting (flexion-based activities), lie in a position in which your body does not bend very much. For example, avoid curling up on your side with your arms and knees near your chest (fetal position).  If you have pain with activities such as standing for a long time or reaching with your arms (extension-based activities), lie with your spine in a neutral position and bend your knees slightly. Try the following positions:  Lying on your side with a pillow between your knees.  Lying on your back with a pillow under your knees. Lifting   When lifting objects, keep your feet at least shoulder-width apart and tighten your abdominal muscles.  Bend your knees and hips and keep your spine neutral. It is important to lift using the strength of your legs, not your back. Do not lock your knees straight out.  Always ask for help to lift heavy  or awkward objects. This information is not intended to replace advice given to you by your health care provider. Make sure you discuss any questions you have with your health care provider. Document Released: 01/03/2005 Document Revised: 09/10/2015 Document Reviewed: 09/19/2014 Elsevier Interactive Patient Education  2017 [employer].

## 2016-03-09 ENCOUNTER — Telehealth: Payer: Self-pay | Admitting: *Deleted

## 2016-03-09 MED ORDER — PENICILLIN V POTASSIUM 500 MG PO TABS: 500.0000 mg | ORAL_TABLET | Freq: Four times a day (QID) | ORAL | 0 refills | Status: DC

## 2016-03-09 NOTE — Telephone Encounter (Signed)
Advised to use warm or cold compresses, take 2 ES Tylenol every 6 hours, and call dentist ASAP. Antibiotic sent to pharmacy. Pt voiced understanding. [place]

## 2016-03-09 NOTE — Telephone Encounter (Signed)
Spoke with pt. Pt has a toothache. Tylenol is not helping. She hasn't seen a dentist. Can you prescribe something? Thanks!! [place]

## 2016-03-09 NOTE — Telephone Encounter (Signed)
Pt aware antibiotic sent to pharmacy but pt needs to see dentist ASAP. Pt voiced understanding. [place]

## 2016-03-16 ENCOUNTER — Ambulatory Visit (INDEPENDENT_AMBULATORY_CARE_PROVIDER_SITE_OTHER): Payer: BLUE CROSS/BLUE SHIELD | Admitting: Advanced Practice Midwife

## 2016-03-16 ENCOUNTER — Encounter: Payer: Self-pay | Admitting: Advanced Practice Midwife

## 2016-03-16 VITALS — BP 90/60 | HR 80 | Wt 155.0 lb

## 2016-03-16 DIAGNOSIS — Z3685 Encounter for antenatal screening for Streptococcus B: Secondary | ICD-10-CM

## 2016-03-16 DIAGNOSIS — Z3483 Encounter for supervision of other normal pregnancy, third trimester: Secondary | ICD-10-CM

## 2016-03-16 DIAGNOSIS — Z331 Pregnant state, incidental: Secondary | ICD-10-CM

## 2016-03-16 DIAGNOSIS — Z1389 Encounter for screening for other disorder: Secondary | ICD-10-CM

## 2016-03-16 DIAGNOSIS — O09293 Supervision of pregnancy with other poor reproductive or obstetric history, third trimester: Secondary | ICD-10-CM

## 2016-03-16 DIAGNOSIS — Z3A37 37 weeks gestation of pregnancy: Secondary | ICD-10-CM

## 2016-03-16 LAB — POCT URINALYSIS DIPSTICK
Glucose, UA: NEGATIVE
Ketones, UA: NEGATIVE
Leukocytes, UA: NEGATIVE
NITRITE UA: NEGATIVE
RBC UA: NEGATIVE

## 2016-03-16 NOTE — Progress Notes (Signed)
[id number] [redacted]w[redacted]d Estimated Date of Delivery: 04/09/16  Blood pressure 90/60, pulse 80, weight 155 lb (70.3 kg), last menstrual period 07/10/2015.   BP weight and urine results all reviewed and noted.  Please refer to the obstetrical flow sheet for the fundal height and fetal heart rate documentation:  Patient reports good fetal movement, denies any bleeding and no rupture of membranes symptoms or regular contractions. Patient is without complaints. All questions were answered.  Orders Placed This Encounter  Procedures  . Strep Gp B NAA  . US OB Follow Up  . POCT urinalysis dipstick    Plan:  Continued routine obstetrical care, will start maternity leave 03/29/16.  FMLA papers were already filled out. Note written  Return in about 1 week (around 03/23/2016) for LROB, US:EFW.

## 2016-03-16 NOTE — Patient Instructions (Signed)

## 2016-03-18 LAB — STREP GP B NAA: STREP GROUP B AG: NEGATIVE

## 2016-03-23 ENCOUNTER — Other Ambulatory Visit: Payer: BLUE CROSS/BLUE SHIELD

## 2016-03-23 ENCOUNTER — Encounter: Payer: BLUE CROSS/BLUE SHIELD | Admitting: Advanced Practice Midwife

## 2016-03-25 ENCOUNTER — Encounter: Payer: Self-pay | Admitting: Obstetrics & Gynecology

## 2016-03-25 ENCOUNTER — Ambulatory Visit (INDEPENDENT_AMBULATORY_CARE_PROVIDER_SITE_OTHER): Payer: BLUE CROSS/BLUE SHIELD

## 2016-03-25 ENCOUNTER — Ambulatory Visit (INDEPENDENT_AMBULATORY_CARE_PROVIDER_SITE_OTHER): Payer: BLUE CROSS/BLUE SHIELD | Admitting: Obstetrics & Gynecology

## 2016-03-25 VITALS — BP 100/60 | HR 102 | Wt 156.0 lb

## 2016-03-25 DIAGNOSIS — Z3A38 38 weeks gestation of pregnancy: Secondary | ICD-10-CM | POA: Diagnosis not present

## 2016-03-25 DIAGNOSIS — Z331 Pregnant state, incidental: Secondary | ICD-10-CM

## 2016-03-25 DIAGNOSIS — O09293 Supervision of pregnancy with other poor reproductive or obstetric history, third trimester: Secondary | ICD-10-CM | POA: Diagnosis not present

## 2016-03-25 DIAGNOSIS — Z1389 Encounter for screening for other disorder: Secondary | ICD-10-CM

## 2016-03-25 DIAGNOSIS — Z3483 Encounter for supervision of other normal pregnancy, third trimester: Secondary | ICD-10-CM

## 2016-03-25 LAB — POCT URINALYSIS DIPSTICK
GLUCOSE UA: NEGATIVE
Ketones, UA: NEGATIVE
Nitrite, UA: NEGATIVE
RBC UA: NEGATIVE

## 2016-03-25 NOTE — Progress Notes (Signed)
[id number] [redacted]w[redacted]d Estimated Date of Delivery: 04/09/16  Blood pressure 100/60, pulse (!) 102, weight 156 lb (70.8 kg), last menstrual period 07/10/2015.   BP weight and urine results all reviewed and noted.  Please refer to the obstetrical flow sheet for the fundal height and fetal heart rate documentation:  Patient reports good fetal movement, denies any bleeding and no rupture of membranes symptoms or regular contractions. Patient is without complaints. All questions were answered.  Orders Placed This Encounter  Procedures  . POCT Urinalysis Dipstick    Plan:  Continued routine obstetrical care, EFW 46%  Return in about 1 week (around 04/01/2016).

## 2016-03-25 NOTE — Progress Notes (Signed)
[place] 53+9 wks,cephalic,ant pl gr 1,normal ov's bilat,fhr 139 bpm,EFW 3154 g 46%,afi 13.8 cm

## 2016-04-01 ENCOUNTER — Ambulatory Visit (INDEPENDENT_AMBULATORY_CARE_PROVIDER_SITE_OTHER): Payer: BLUE CROSS/BLUE SHIELD | Admitting: Obstetrics and Gynecology

## 2016-04-01 ENCOUNTER — Encounter: Payer: Self-pay | Admitting: Obstetrics and Gynecology

## 2016-04-01 VITALS — BP 100/56 | HR 82 | Wt 160.0 lb

## 2016-04-01 DIAGNOSIS — Z331 Pregnant state, incidental: Secondary | ICD-10-CM

## 2016-04-01 DIAGNOSIS — Z1389 Encounter for screening for other disorder: Secondary | ICD-10-CM

## 2016-04-01 DIAGNOSIS — Z3483 Encounter for supervision of other normal pregnancy, third trimester: Secondary | ICD-10-CM

## 2016-04-01 LAB — POCT URINALYSIS DIPSTICK
Glucose, UA: NEGATIVE
KETONES UA: NEGATIVE
Nitrite, UA: NEGATIVE
RBC UA: NEGATIVE

## 2016-04-01 NOTE — Progress Notes (Signed)
DEBORA STOCKDALE is a 26 y.o. female   [id number]  Estimated Date of Delivery: 04/09/16 LROB [redacted]w[redacted]d  Chief Complaint  Patient presents with  . Routine Prenatal Visit  ____  Patient complaints: pain to the BLE but notes increased pain to the LLE in recent days. She has no other acute complaints or symptoms at this time. Patient reports  good fetal movement; denies any bleeding , rupture of membranes,or regular contractions.  Blood pressure (!) 100/56, pulse 82, weight 160 lb (72.6 kg), last menstrual period 07/10/2015.   Urine results:notable for trace protein, moderate leukocytes refer to the ob flow sheet for FH and FHR, ,                          Physical Examination: General appearance - alert, well appearing, and in no distress                                      Abdomen - FH 37 cm ,                                                         -FHR 145                                                         soft, nontender, nondistended, no masses or organomegaly                                      Pelvic - Pelvic exam: VULVA: normal appearing vulva with no masses, tenderness or lesions, VAGINA: normal appearing vagina with normal color and discharge, no lesions. CERVIX: 2 cm long, firm; no bleeding                                             Questions were answered. Assessment: LROB [id number] @ [redacted]w[redacted]d   Plan:  Continued routine obstetrical care  F/u in 1 week for routine OB care   By signing my name below, I, Evelene Croon, attest that this documentation has been prepared under the direction and in the presence of Jonnie Kind, MD . Electronically Signed: Evelene Croon, Scribe. 04/01/2016. 12:31 PM. I personally performed the services described in this documentation, which was SCRIBED in my presence. The recorded information has been reviewed and considered accurate. It has been edited as necessary during review. Jonnie Kind, MD

## 2016-04-08 ENCOUNTER — Encounter: Payer: Self-pay | Admitting: Obstetrics & Gynecology

## 2016-04-08 ENCOUNTER — Ambulatory Visit (INDEPENDENT_AMBULATORY_CARE_PROVIDER_SITE_OTHER): Payer: BLUE CROSS/BLUE SHIELD | Admitting: Obstetrics & Gynecology

## 2016-04-08 VITALS — BP 118/62 | HR 110 | Wt 161.0 lb

## 2016-04-08 DIAGNOSIS — Z3483 Encounter for supervision of other normal pregnancy, third trimester: Secondary | ICD-10-CM | POA: Diagnosis not present

## 2016-04-08 DIAGNOSIS — Z331 Pregnant state, incidental: Secondary | ICD-10-CM

## 2016-04-08 DIAGNOSIS — Z1389 Encounter for screening for other disorder: Secondary | ICD-10-CM

## 2016-04-08 LAB — POCT URINALYSIS DIPSTICK
GLUCOSE UA: NEGATIVE
Ketones, UA: NEGATIVE
Nitrite, UA: NEGATIVE
Protein, UA: NEGATIVE
RBC UA: NEGATIVE

## 2016-04-08 NOTE — Progress Notes (Signed)
[id number] [redacted]w[redacted]d Estimated Date of Delivery: 04/09/16  Blood pressure 118/62, pulse (!) 110, weight 161 lb (73 kg), last menstrual period 07/10/2015.   BP weight and urine results all reviewed and noted.  Please refer to the obstetrical flow sheet for the fundal height and fetal heart rate documentation:  Patient reports good fetal movement, denies any bleeding and no rupture of membranes symptoms or regular contractions. Patient is without complaints. All questions were answered.  Orders Placed This Encounter  Procedures  . POCT urinalysis dipstick    Plan:  Continued routine obstetrical care, membranes stripped  cx 3-4/50/-2  Return in about 6 weeks (around 05/20/2016) for post partum visit.

## 2016-04-08 NOTE — Treatment Plan (Signed)
   Induction Assessment Scheduling Form: [facility]ax to [facility]:  [phone]  [name]                                                                                   DOB:  1990-05-29                                                            MRN:  [id number]                                                                     Phone #:   [phone]                         Provider:  [facility]  GP:  [id number]                                                            Estimated Date of Delivery: 04/09/16  Dating Criteria: early sonogram    Medical Indications for induction:  Post Dates Admission Date/Time:  04/16/2016@0730  Gestational age on admission:  [redacted]w[redacted]d   Filed Weights   04/08/16 1036  Weight: 161 lb (73 kg)   HIV:  Non Reactive (12/27 0907) GBS: Negative (02/28 1630)  3.5 cm dilated, 50 effaced, -2 station, cervical position posterior, consistency soft, Bishop score 10, presenting part vertex   Method of induction(proposed):  choice   Scheduling Provider Signature:  Florian Buff, MD                                            Today's Date:  04/08/2016

## 2016-04-10 ENCOUNTER — Encounter (HOSPITAL_COMMUNITY): Payer: Self-pay

## 2016-04-10 ENCOUNTER — Inpatient Hospital Stay (HOSPITAL_COMMUNITY): Payer: BLUE CROSS/BLUE SHIELD | Admitting: Anesthesiology

## 2016-04-10 ENCOUNTER — Inpatient Hospital Stay (HOSPITAL_COMMUNITY)
Admission: AD | Admit: 2016-04-10 | Discharge: 2016-04-12 | DRG: 775 | Disposition: A | Discharge status: Home / Self Care | Payer: BLUE CROSS/BLUE SHIELD | Source: Ambulatory Visit | Attending: Obstetrics and Gynecology | Admitting: Obstetrics and Gynecology

## 2016-04-10 ENCOUNTER — Inpatient Hospital Stay (EMERGENCY_DEPARTMENT_HOSPITAL)
Admission: AD | Admit: 2016-04-10 | Discharge: 2016-04-10 | Disposition: A | Discharge status: Home / Self Care | Payer: BLUE CROSS/BLUE SHIELD | Source: Ambulatory Visit | Attending: Obstetrics & Gynecology | Admitting: Obstetrics & Gynecology

## 2016-04-10 DIAGNOSIS — Z833 Family history of diabetes mellitus: Secondary | ICD-10-CM

## 2016-04-10 DIAGNOSIS — Z3A40 40 weeks gestation of pregnancy: Secondary | ICD-10-CM

## 2016-04-10 DIAGNOSIS — Z8249 Family history of ischemic heart disease and other diseases of the circulatory system: Secondary | ICD-10-CM | POA: Diagnosis not present

## 2016-04-10 DIAGNOSIS — Z3493 Encounter for supervision of normal pregnancy, unspecified, third trimester: Secondary | ICD-10-CM | POA: Diagnosis present

## 2016-04-10 DIAGNOSIS — O471 False labor at or after 37 completed weeks of gestation: Secondary | ICD-10-CM | POA: Diagnosis not present

## 2016-04-10 LAB — TYPE AND SCREEN
ABO/RH(D): O POS
ANTIBODY SCREEN: NEGATIVE

## 2016-04-10 LAB — CBC
HCT: 34.6 % — ABNORMAL LOW (ref 36.0–46.0)
Hemoglobin: 12 g/dL (ref 12.0–15.0)
MCH: 30.3 pg (ref 26.0–34.0)
MCHC: 34.7 g/dL (ref 30.0–36.0)
MCV: 87.4 fL (ref 78.0–100.0)
Platelets: 144 10*3/uL — ABNORMAL LOW (ref 150–400)
RBC: 3.96 MIL/uL (ref 3.87–5.11)
RDW: 14.5 % (ref 11.5–15.5)
WBC: 16.4 10*3/uL — AB (ref 4.0–10.5)

## 2016-04-10 LAB — ABO/RH: ABO/RH(D): O POS

## 2016-04-10 LAB — POCT FERN TEST

## 2016-04-10 MED ORDER — ACETAMINOPHEN 325 MG PO TABS
650.0000 mg | ORAL_TABLET | ORAL | Status: DC | PRN
  Administered 2016-04-10: 650 mg via ORAL
  Filled 2016-04-10: qty 2

## 2016-04-10 MED ORDER — LACTATED RINGERS IV SOLN
INTRAVENOUS | Status: DC
  Administered 2016-04-10 (×2): via INTRAVENOUS

## 2016-04-10 MED ORDER — FENTANYL CITRATE (PF) 100 MCG/2ML IJ SOLN
100.0000 ug | Freq: Once | INTRAMUSCULAR | Status: AC
  Administered 2016-04-10: 100 ug via INTRAVENOUS
  Filled 2016-04-10: qty 2

## 2016-04-10 MED ORDER — LACTATED RINGERS IV SOLN: 500.0000 mL | INTRAVENOUS | Status: DC | PRN

## 2016-04-10 MED ORDER — ONDANSETRON HCL 4 MG/2ML IJ SOLN: 4.0000 mg | Freq: Four times a day (QID) | INTRAMUSCULAR | Status: DC | PRN

## 2016-04-10 MED ORDER — TERBUTALINE SULFATE 1 MG/ML IJ SOLN
0.2500 mg | Freq: Once | INTRAMUSCULAR | Status: DC | PRN
  Filled 2016-04-10: qty 1

## 2016-04-10 MED ORDER — OXYTOCIN BOLUS FROM INFUSION
500.0000 mL | Freq: Once | INTRAVENOUS | Status: AC
  Administered 2016-04-10: 500 mL via INTRAVENOUS

## 2016-04-10 MED ORDER — LIDOCAINE HCL (PF) 1 % IJ SOLN
30.0000 mL | INTRAMUSCULAR | Status: DC | PRN
  Filled 2016-04-10: qty 30

## 2016-04-10 MED ORDER — PHENYLEPHRINE 40 MCG/ML (10ML) SYRINGE FOR IV PUSH (FOR BLOOD PRESSURE SUPPORT)
80.0000 ug | PREFILLED_SYRINGE | INTRAVENOUS | Status: DC | PRN
  Filled 2016-04-10: qty 5
  Filled 2016-04-10: qty 10

## 2016-04-10 MED ORDER — DIPHENHYDRAMINE HCL 50 MG/ML IJ SOLN: 12.5000 mg | INTRAMUSCULAR | Status: DC | PRN

## 2016-04-10 MED ORDER — FENTANYL 2.5 MCG/ML BUPIVACAINE 1/10 % EPIDURAL INFUSION (WH - ANES)
14.0000 mL/h | INTRAMUSCULAR | Status: DC | PRN
  Administered 2016-04-10: 14 mL/h via EPIDURAL
  Filled 2016-04-10: qty 100

## 2016-04-10 MED ORDER — LIDOCAINE HCL (PF) 1 % IJ SOLN
INTRAMUSCULAR | Status: DC | PRN
  Administered 2016-04-10 (×2): 5 mL

## 2016-04-10 MED ORDER — IBUPROFEN 600 MG PO TABS
600.0000 mg | ORAL_TABLET | Freq: Four times a day (QID) | ORAL | Status: DC
  Administered 2016-04-11 – 2016-04-12 (×7): 600 mg via ORAL
  Filled 2016-04-10 (×7): qty 1

## 2016-04-10 MED ORDER — OXYCODONE-ACETAMINOPHEN 5-325 MG PO TABS: 1.0000 | ORAL_TABLET | ORAL | Status: DC | PRN

## 2016-04-10 MED ORDER — OXYTOCIN 40 UNITS IN LACTATED RINGERS INFUSION - SIMPLE MED
2.5000 [IU]/h | INTRAVENOUS | Status: DC
  Filled 2016-04-10: qty 1000

## 2016-04-10 MED ORDER — EPHEDRINE 5 MG/ML INJ
10.0000 mg | INTRAVENOUS | Status: DC | PRN
  Filled 2016-04-10: qty 2

## 2016-04-10 MED ORDER — LACTATED RINGERS IV SOLN
500.0000 mL | Freq: Once | INTRAVENOUS | Status: AC
  Administered 2016-04-10: 500 mL via INTRAVENOUS

## 2016-04-10 MED ORDER — FLEET ENEMA 7-19 GM/118ML RE ENEM: 1.0000 | ENEMA | RECTAL | Status: DC | PRN

## 2016-04-10 MED ORDER — SOD CITRATE-CITRIC ACID 500-334 MG/5ML PO SOLN: 30.0000 mL | ORAL | Status: DC | PRN

## 2016-04-10 MED ORDER — OXYCODONE-ACETAMINOPHEN 5-325 MG PO TABS: 2.0000 | ORAL_TABLET | ORAL | Status: DC | PRN

## 2016-04-10 MED ORDER — PHENYLEPHRINE 40 MCG/ML (10ML) SYRINGE FOR IV PUSH (FOR BLOOD PRESSURE SUPPORT)
80.0000 ug | PREFILLED_SYRINGE | INTRAVENOUS | Status: DC | PRN
  Filled 2016-04-10: qty 5

## 2016-04-10 NOTE — Discharge Instructions (Signed)

## 2016-04-10 NOTE — MAU Note (Signed)
Called report to Dr. Hardin Negus patient [phone] [redacted]w[redacted]d came in for rule out rupture of membranes, having regular contractions, cervix when last checked was 2-3 cm

## 2016-04-10 NOTE — Progress Notes (Addendum)
MD made aware of pt status by charge nurse. Orders received to walk patient when reactive for an hr and recheck cervix.   1714: reactive strip. See flowsheet for details of EFM. Pt up to walk. Instructed pt to come back in an hr to recheck cervix.

## 2016-04-10 NOTE — Anesthesia Preprocedure Evaluation (Signed)

## 2016-04-10 NOTE — H&P (Signed)
LABOR AND DELIVERY ADMISSION HISTORY AND PHYSICAL NOTE  Deanna Hughes is a 26 y.o. female [phone] with IUP at [redacted]w[redacted]d by 6w [place] presenting for SOL. Pt states that contractions started last night and have continued.  She has also had several gushes of fluid since this morning with positive pooling. However, fern was negative.  She was initially sent home this morning after not making any cervical change.  She ambulated for 1 hour and cervix changed from 4/80/-2 to 5.5/90/-1.  She is being admitted for labor.  She reports positive fetal movement. She denies leakage of fluid or vaginal bleeding.  Prenatal History/Complications:  Past Medical History: Past Medical History:  Diagnosis Date  . BV (bacterial vaginosis) 04/02/2015  . Chlamydia    treated 6/6  . Chronic back pain   . Chronic headaches   . Contraceptive management 01/25/2013  . Depression   . Encounter for Nexplanon removal 02/27/2015  . Irregular bleeding 06/03/2013  . Miscarriage 03/27/2013  . Nexplanon insertion 04/11/2013   nexplanon inserted 04/11/13 remove 3/32/18  . Right sided abdominal pain   . Scoliosis   . Seasonal allergies 07/03/2012  . Sinus infection 08/27/2013  . Supervision of normal pregnancy 08/27/2015    [facility] at   7+5 weeks  FOB  Dating By  LMP  Pap  GC/CT Initial:                36+wks: Genetic Screen NT/IT:  CF screen  Anatomic [place]  Flu vaccine  Tdap Recommended ~ 28wks Glucose Screen  2 hr GBS  Feed Preference  Contraception  Circumcision  Childbirth Classes  Pediatrician    . Threatened abortion in early pregnancy 03/20/2013  . Vaginal discharge 10/03/2012  . Yeast infection 07/03/2012    Past Surgical History: Past Surgical History:  Procedure Laterality Date  . NO PAST SURGERIES      Obstetrical History: OB History    Gravida Para Term Preterm AB Living   5 2 2  0 2 2   SAB TAB Ectopic Multiple Live Births   2 0 0 0 2      Social History: Social History   Social History   . Marital status: Single    Spouse name: N/A  . Number of children: N/A  . Years of education: N/A   Social History Main Topics  . Smoking status: Never Smoker  . Smokeless tobacco: Never Used  . Alcohol use No  . Drug use: No  . Sexual activity: Not Currently    Birth control/ protection: None   Other Topics Concern  . None   Social History Narrative  . None    Family History: Family History  Problem Relation Age of Onset  . Asthma Mother   . Asthma Sister   . Asthma Brother   . Asthma Daughter   . Diabetes Maternal Grandmother   . Hypertension Maternal Grandmother   . Asthma Maternal Grandmother   . Heart disease Maternal Grandmother   . Stroke Maternal Grandmother   . Hypertension Maternal Grandfather   . Asthma Maternal Grandfather   . Heart disease Maternal Grandfather   . Asthma Son     Allergies: No Known Allergies  Prescriptions Prior to Admission  Medication Sig Dispense Refill Last Dose  . acetaminophen (TYLENOL) 325 MG tablet Take 650 mg by mouth every 6 (six) hours as needed for mild pain, moderate pain, fever or headache.    Past Week at Unknown time  .  Prenat-FeCbn-FeAsp-Meth-FA-DHA (PRENATE MINI) 18-0.6-0.4-350 MG CAPS Take 1 capsule by mouth daily. 30 capsule 11 04/09/2016 at Unknown time     Review of Systems   All systems reviewed and negative except as stated in HPI  Blood pressure 115/71, pulse (!) 110, temperature 98 F (36.7 C), resp. rate 18, last menstrual period 07/10/2015. General appearance: alert, cooperative and moderate distress Lungs: clear to auscultation bilaterally Heart: regular rate and rhythm Abdomen: soft, non-tender; bowel sounds normal Extremities: No calf swelling or tenderness Presentation: cephalic Fetal monitoring: 140 Uterine activity: q4-16m Dilation: 5.5 Effacement (%): 90 Station: -1 Exam by:: Dr. Vanetta Shawl   Prenatal labs: ABO, Rh: O/Positive/-- (08/10 1425) Antibody: Negative (12/27 0907) Rubella:  Immune RPR: Non Reactive (12/27 0907)  HBsAg: Negative (08/10 1425)  HIV: Non Reactive (12/27 0907)  GBS: Negative (02/28 1630)  2 hr Glucola: normal Genetic screening:  negative Anatomy US: normal  Prenatal Transfer Tool  Maternal Diabetes: No Genetic Screening: Normal Maternal Ultrasounds/Referrals: None Fetal Ultrasounds or other Referrals:  None Maternal Substance Abuse:  No Significant Maternal Medications:  None Significant Maternal Lab Results: None  Results for orders placed or performed during the hospital encounter of 04/10/16 (from the past 24 hour(s))  POCT fern test   Collection Time: 04/10/16 12:50 PM  Result Value Ref Range   POCT Fern Test      Patient Active Problem List   Diagnosis Date Noted  . Normal labor 04/10/2016  . Inguinal hernia 12/14/2015  . Supervision of normal pregnancy 08/27/2015  . BV (bacterial vaginosis) 04/02/2015  . Sinus infection 08/27/2013  . Irregular bleeding 06/03/2013  . Seasonal allergies 07/03/2012  . Right sided abdominal pain   . Defecation urgency     Assessment: Deanna Hughes is a 26 y.o. P5T6144 at 101w1d here for SOL  #Labor:active #Pain: Desires epidural #FWB: Cat I strip #ID: GBS neg #MOF: breast #MOC: depo #Circ:  NA  Aura Camps, MD Family Medicine Resident, PGY-2 04/10/2016, 6:12 PM

## 2016-04-10 NOTE — MAU Note (Signed)
Pt presents to MAU with complaints of contractions and LOF. Denies any VB

## 2016-04-10 NOTE — MAU Note (Signed)
Contractions since yesterday lost mucus plug yesterday, presents with having 3 gushes of fluid and regular contractions.

## 2016-04-10 NOTE — Progress Notes (Signed)
Patient ID: Deanna Hughes, female   DOB: [birth year], 26 y.o.   MRN: [id number]  Pushing x 30-28mins now VSS, afeb FHR 140s, +accels w/ pushing Ctx q 2 mins, spont Vtx- half dollar amt visualized w/ pushing  IUP@term  Pushing  Pushed 1 hr w/ prior baby Anticipate SVD  Serita Grammes CNM 04/10/2016 10:44 PM

## 2016-04-10 NOTE — Anesthesia Procedure Notes (Signed)
Epidural Patient location during procedure: OB  Staffing Anesthesiologist: Montez Hageman Performed: anesthesiologist   Preanesthetic Checklist Completed: patient identified, site marked, surgical consent, pre-op evaluation, timeout performed, IV checked, risks and benefits discussed and monitors and equipment checked  Epidural Patient position: sitting Prep: DuraPrep Patient monitoring: heart rate, continuous pulse ox and blood pressure Approach: right paramedian Location: L3-L4 Injection technique: LOR saline  Needle:  Needle type: Tuohy  Needle gauge: 17 G Needle length: 9 cm and 9 Needle insertion depth: 5 cm Catheter type: closed end flexible Catheter size: 20 Guage Catheter at skin depth: 9 cm Test dose: negative  Assessment Events: blood not aspirated, injection not painful, no injection resistance, negative IV test and no paresthesia  Additional Notes Patient identified. Risks/Benefits/Options discussed with patient including but not limited to bleeding, infection, nerve damage, paralysis, failed block, incomplete pain control, headache, blood pressure changes, nausea, vomiting, reactions to medication both or allergic, itching and postpartum back pain. Confirmed with bedside nurse the patient's most recent platelet count. Confirmed with patient that they are not currently taking any anticoagulation, have any bleeding history or any family history of bleeding disorders. Patient expressed understanding and wished to proceed. All questions were answered. Sterile technique was used throughout the entire procedure. Please see nursing notes for vital signs. Test dose was given through epidural needle and negative prior to continuing to dose epidural or start infusion. Warning signs of high block given to the patient including shortness of breath, tingling/numbness in hands, complete motor block, or any concerning symptoms with instructions to call for help. Patient was given  instructions on fall risk and not to get out of bed. All questions and concerns addressed with instructions to call with any issues.

## 2016-04-10 NOTE — MAU Provider Note (Signed)
Chief Complaint:  Rupture of Membranes and Contractions   First Provider Initiated Contact with Patient 04/10/16 1107      HPI: Deanna Hughes is a 26 y.o. A8T4196 at [redacted]w[redacted]d who presents to maternity admissions reporting contractions and gush of fluid.  She states that contractions started last night around 10pm.  She was able to sleep through them, but this morning they have become more intense.  She states that she also has had 3 gushes of fluid with contractions this morning.  Last intercourse was several weeks ago.  Last check in the office 3/23 was 3-4/50/-2.   Denies vaginal bleeding. Good fetal movement.   Pregnancy Course:   Past Medical History: Past Medical History:  Diagnosis Date  . BV (bacterial vaginosis) 04/02/2015  . Chlamydia    treated 6/6  . Chronic back pain   . Chronic headaches   . Contraceptive management 01/25/2013  . Depression   . Encounter for Nexplanon removal 02/27/2015  . Irregular bleeding 06/03/2013  . Miscarriage 03/27/2013  . Nexplanon insertion 04/11/2013   nexplanon inserted 04/11/13 remove 3/32/18  . Right sided abdominal pain   . Scoliosis   . Seasonal allergies 07/03/2012  . Sinus infection 08/27/2013  . Supervision of normal pregnancy 08/27/2015    [facility] at   7+5 weeks  FOB  Dating By  LMP  Pap  GC/CT Initial:                36+wks: Genetic Screen NT/IT:  CF screen  Anatomic [place]  Flu vaccine  Tdap Recommended ~ 28wks Glucose Screen  2 hr GBS  Feed Preference  Contraception  Circumcision  Childbirth Classes  Pediatrician    . Threatened abortion in early pregnancy 03/20/2013  . Vaginal discharge 10/03/2012  . Yeast infection 07/03/2012    Past obstetric history: OB History  Gravida Para Term Preterm AB Living  5 2 2  0 2 2  SAB TAB Ectopic Multiple Live Births  2 0 0 0 2    # Outcome Date GA Lbr Len/2nd Weight Sex Delivery Anes PTL Lv  5 Current           4 Term 07/21/11 [redacted]w[redacted]d 16:15 / 01:05 9 lb 5 oz (4.224 kg) M  Vag-Spont EPI  LIV     Birth Comments: No problems at birth  39 SAB              Birth Comments: System Generated. Please review and update pregnancy details.  2 SAB              Birth Comments: System Generated. Please review and update pregnancy details.  1 Term  [redacted]w[redacted]d 24:00 7 lb 10 oz (3.459 kg) M Vag-Spont EPI  LIV      Past Surgical History: Past Surgical History:  Procedure Laterality Date  . NO PAST SURGERIES       Family History: Family History  Problem Relation Age of Onset  . Asthma Mother   . Asthma Sister   . Asthma Brother   . Asthma Daughter   . Diabetes Maternal Grandmother   . Hypertension Maternal Grandmother   . Asthma Maternal Grandmother   . Heart disease Maternal Grandmother   . Stroke Maternal Grandmother   . Hypertension Maternal Grandfather   . Asthma Maternal Grandfather   . Heart disease Maternal Grandfather   . Asthma Son     Social History: Social History  Substance Use Topics  . Smoking status: Never  Smoker  . Smokeless tobacco: Never Used  . Alcohol use No    Allergies: No Known Allergies  Meds:  Prescriptions Prior to Admission  Medication Sig Dispense Refill Last Dose  . acetaminophen (TYLENOL) 325 MG tablet Take 650 mg by mouth every 6 (six) hours as needed for mild pain, moderate pain, fever or headache.    Past Week at Unknown time  . Prenat-FeCbn-FeAsp-Meth-FA-DHA (PRENATE MINI) 18-0.6-0.4-350 MG CAPS Take 1 capsule by mouth daily. 30 capsule 11 04/09/2016 at Unknown time    I have reviewed patient's Past Medical Hx, Surgical Hx, Family Hx, Social Hx, medications and allergies.   ROS:  A comprehensive ROS was negative except per HPI.    Physical Exam   Patient Vitals for the past 24 hrs:  BP Temp Pulse Resp Height Weight  04/10/16 1045 (!) 100/59 98.3 F (36.8 C) (!) 124 16 - -  04/10/16 1042 - - - - 5\' 2"  (1.575 m) 158 lb (71.7 kg)   Constitutional: Well-developed, well-nourished female in no acute distress.   Cardiovascular: normal rate Respiratory: normal effort GI: Abd soft, non-tender, gravid appropriate for gestational age. Pos BS x 4 MS: Extremities nontender, no edema, normal ROM Neurologic: Alert and oriented x 4.  GU: Neg CVAT. Pelvic: NEFG, physiologic discharge, no blood, cervix clean. No CMT Dilation: 3.5 Effacement (%): 70 Cervical Position: Posterior Station: -3 Exam by:: Dr. Vanetta Shawl FHT:  Baseline 140, moderate variability, accelerations present, no decelerations Contractions: q 4-5 mins   Labs: Results for orders placed or performed during the hospital encounter of 04/10/16 (from the past 24 hour(s))  POCT fern test     Status: Normal   Collection Time: 04/10/16 12:50 PM  Result Value Ref Range   POCT [place] Test      Imaging:  US Ob Follow Up  Result Date: 03/25/2016 FOLLOW UP SONOGRAM Deanna Hughes is in the office for a follow up sonogram for EFW. She is a 26 y.o. year old G5P2022 with Estimated Date of Delivery: 04/09/16 by early ultrasound now at  [redacted]w[redacted]d weeks gestation. Thus far the pregnancy has been complicated by HX of macrosomia prior pregnancy. GESTATION: SINGLETON PRESENTATION: cephalic FETAL ACTIVITY:          Heart rate         139          The fetus is active. AMNIOTIC FLUID: The amniotic fluid volume is  normal, 13.8 cm. PLACENTA LOCALIZATION:  anterior GRADE 1 CERVIX: Limited view ADNEXA: The ovaries are normal. GESTATIONAL AGE AND  BIOMETRICS: Gestational criteria: Estimated Date of Delivery: 04/09/16 by early ultrasound now at [redacted]w[redacted]d Previous Scans:4          BIPARIETAL DIAMETER           8.54 cm         34+2 weeks HEAD CIRCUMFERENCE           32.05 cm         36+1 weeks ABDOMINAL CIRCUMFERENCE           33.95 cm         37+5 weeks FEMUR LENGTH           7.40 cm         37+6 weeks  AVERAGE EGA(BY THIS SCAN):  36+3 weeks                                                 ESTIMATED FETAL WEIGHT:       3154  grams, 46 %  ANATOMICAL SURVEY                                                                            COMMENTS CEREBRAL VENTRICLES yes normal  CHOROID PLEXUS yes normal                  NASAL BONE yes normal  NOSE/LIP    FACIAL PROFILE yes normal  4 CHAMBERED HEART yes normal  OUTFLOW TRACTS yes normal  DIAPHRAGM yes normal  STOMACH yes normal  RENAL REGION yes normal  BLADDER yes normal      3 VESSEL CORD yes normal              GENITALIA yes normal female     SUSPECTED ABNORMALITIES:  no QUALITY OF SCAN: satisfactory TECHNICIAN COMMENTS: [place] 85+8 wks,cephalic,ant pl gr 1,normal ov's bilat,fhr 139 bpm,EFW 3154 g 46%,afi 13.8 cm A copy of this report including all images has been saved and backed up to a second source for retrieval if needed. All measures and details of the anatomical scan, placentation, fluid volume and pelvic anatomy are contained in that report. Amber Heide Guile 03/25/2016 12:30 PM Clinical Impression and recommendations: I have reviewed the sonogram results above, combined with the patient's current clinical course, below are my impressions and any appropriate recommendations for management based on the sonographic findings. 1.  [id number] Estimated Date of Delivery: 04/09/16 by serial sonographic evaluations 2.  Fetal sonographic surveillance findings: a). Normal fluid volume b). Normal growth percentile with appropriate interval growth 3.  Normal general sonographic findings Recommend continued prenatal evaluations and care based on this sonogram and as clinically indicated from the patient's clinical course. EURE,LUTHER H 03/25/2016 12:56 PM    MAU Course: SSE: pooling of small amount of clear fluid; Fern negative. Will walk pt for an hour and recheck. Rechecked CE after 1 hour of ambulating. No change.  MDM: Plan of care reviewed with patient, including labs and tests ordered and medical treatment.   Assessment: 1. False labor after 37 completed weeks of gestation     Plan: Discharge home in  stable condition.  Labor precautions and fetal kick counts  Follow-up Information    [facility] Follow up.   Specialty:  Obstetrics and Gynecology Why:  Go to next scheduled appointment Contact information: [address] [place] [phone]          Allergies as of 04/10/2016   No Known Allergies     Medication List    TAKE these medications   acetaminophen 325 MG tablet Commonly known as:  TYLENOL Take 650 mg by mouth every 6 (six) hours as needed for mild pain, moderate pain, fever or headache.   PRENATE MINI 18-0.6-0.4-350 MG Caps Take 1 capsule by mouth daily.  Aura Camps, MD Family Medicine Resident, PGY-2 [facility] for [facility], [facility] 04/10/2016 12:53 PM   OB FELLOW MAU DISCHARGE ATTESTATION  I have seen and examined this patient; I agree with above documentation in the resident's note. Likely in latent labor.    Katherine Basset, DO OB Fellow

## 2016-04-11 ENCOUNTER — Encounter (HOSPITAL_COMMUNITY): Payer: Self-pay | Admitting: *Deleted

## 2016-04-11 LAB — RPR: RPR Ser Ql: NONREACTIVE

## 2016-04-11 MED ORDER — ONDANSETRON HCL 4 MG/2ML IJ SOLN: 4.0000 mg | INTRAMUSCULAR | Status: DC | PRN

## 2016-04-11 MED ORDER — TETANUS-DIPHTH-ACELL PERTUSSIS 5-2.5-18.5 LF-MCG/0.5 IM SUSP: 0.5000 mL | Freq: Once | INTRAMUSCULAR | Status: DC

## 2016-04-11 MED ORDER — ZOLPIDEM TARTRATE 5 MG PO TABS: 5.0000 mg | ORAL_TABLET | Freq: Every evening | ORAL | Status: DC | PRN

## 2016-04-11 MED ORDER — PRENATAL MULTIVITAMIN CH
1.0000 | ORAL_TABLET | Freq: Every day | ORAL | Status: DC
  Administered 2016-04-11 – 2016-04-12 (×2): 1 via ORAL
  Filled 2016-04-11 (×2): qty 1

## 2016-04-11 MED ORDER — ONDANSETRON HCL 4 MG PO TABS: 4.0000 mg | ORAL_TABLET | ORAL | Status: DC | PRN

## 2016-04-11 MED ORDER — SENNOSIDES-DOCUSATE SODIUM 8.6-50 MG PO TABS
2.0000 | ORAL_TABLET | ORAL | Status: DC
  Administered 2016-04-11 (×2): 2 via ORAL
  Filled 2016-04-11 (×2): qty 2

## 2016-04-11 MED ORDER — ACETAMINOPHEN 325 MG PO TABS
650.0000 mg | ORAL_TABLET | ORAL | Status: DC | PRN
  Administered 2016-04-11 (×3): 650 mg via ORAL
  Filled 2016-04-11 (×3): qty 2

## 2016-04-11 MED ORDER — WITCH HAZEL-GLYCERIN EX PADS
1.0000 "application " | MEDICATED_PAD | CUTANEOUS | Status: DC | PRN
  Administered 2016-04-12: 1 via TOPICAL

## 2016-04-11 MED ORDER — DIPHENHYDRAMINE HCL 25 MG PO CAPS: 25.0000 mg | ORAL_CAPSULE | Freq: Four times a day (QID) | ORAL | Status: DC | PRN

## 2016-04-11 MED ORDER — SIMETHICONE 80 MG PO CHEW: 80.0000 mg | CHEWABLE_TABLET | ORAL | Status: DC | PRN

## 2016-04-11 MED ORDER — OXYCODONE HCL 5 MG PO TABS
5.0000 mg | ORAL_TABLET | ORAL | Status: DC | PRN
  Administered 2016-04-11 – 2016-04-12 (×4): 5 mg via ORAL
  Filled 2016-04-11 (×4): qty 1

## 2016-04-11 MED ORDER — COCONUT OIL OIL: 1.0000 "application " | TOPICAL_OIL | Status: DC | PRN

## 2016-04-11 MED ORDER — BENZOCAINE-MENTHOL 20-0.5 % EX AERO
1.0000 "application " | INHALATION_SPRAY | CUTANEOUS | Status: DC | PRN
  Administered 2016-04-12: 1 via TOPICAL
  Filled 2016-04-11 (×2): qty 56

## 2016-04-11 MED ORDER — DIBUCAINE 1 % RE OINT
1.0000 "application " | TOPICAL_OINTMENT | RECTAL | Status: DC | PRN
  Administered 2016-04-12: 1 via RECTAL
  Filled 2016-04-11 (×2): qty 28

## 2016-04-11 NOTE — Progress Notes (Signed)
Post Partum Day #1 Subjective: no complaints, up ad lib, voiding, tolerating PO and normal lochia  Objective: Blood pressure (!) 107/53, pulse 63, temperature 98.2 F (36.8 C), temperature source Oral, resp. rate 18, height 5\' 2"  (1.575 m), weight 71.7 kg (158 lb), last menstrual period 07/10/2015, SpO2 100 %, unknown if currently breastfeeding.  Physical Exam:  General: alert Lochia: appropriate Uterine Fundus: firm and NT at U-1 DVT Evaluation: No evidence of DVT seen on physical exam.   Recent Labs  04/10/16 1750  HGB 12.0  HCT 34.6*    Assessment/Plan: Plan for discharge tomorrow   LOS: 1 day   Bodie Abernethy C Dekota Kirlin 04/11/2016, 7:22 AM

## 2016-04-11 NOTE — Lactation Note (Signed)
This note was copied from a baby's chart. Lactation Consultation Note  Patient Name: Deanna Hughes [id number] Date: 04/11/2016 Reason for consult: Initial assessment;Other (Comment) (according the snapshot - on moms chart Breast w/ formula / see LC note )  Baby is 25 hours old , and per mom recently fed the baby 30 ml. LC walked in the room.  Baby was spitting up ,and mom and her sister were managing well .  LC reviewed use of the bulb syringe - and and frequent burping. And also the emergency light if needed.  Per mom feels comfortable with the bulb syringe.  Mom expressed desire to breast feed. LC recommended since the baby just fed 30 ml of formula , she needed to wait for her to get hungry and before feeding her formula to call on the nurses light for Lactation to assist with latching.  Per mom active with [facility].  Mother informed of post-discharge support and given phone number to the lactation department, including services for phone call assistance; out-patient appointments; and breastfeeding support group. List of other breastfeeding resources in the community given in the handout. Encouraged mother to call for problems or concerns related to breastfeeding.   Maternal Data    Feeding Feeding Type:  (per mom baby just was fed at 1:30 pm 30 ml of formula )  LATCH Score/Interventions                      Lactation Tools Discussed/Used WIC Program: Yes (per mom [facility] )   Consult Status Consult Status: Follow-up Date: 04/11/16 Follow-up type: In-patient    [place] 04/11/2016, 1:25 PM

## 2016-04-11 NOTE — Anesthesia Postprocedure Evaluation (Signed)
Anesthesia Post Note  Patient: Deanna Hughes  Procedure(s) Performed: * No procedures listed *  Patient location during evaluation: Mother Baby Anesthesia Type: Epidural Level of consciousness: awake and alert Pain management: pain level controlled Vital Signs Assessment: post-procedure vital signs reviewed and stable Respiratory status: spontaneous breathing, nonlabored ventilation and respiratory function stable Cardiovascular status: stable Postop Assessment: no headache, no backache and epidural receding Anesthetic complications: no        Last Vitals:  Vitals:   04/11/16 0200 04/11/16 0605  BP: (!) 119/57 (!) 107/53  Pulse: 79 63  Resp: 18 18  Temp: 37.2 C 36.8 C    Last Pain:  Vitals:   04/11/16 0605  TempSrc: Oral  PainSc: 7    Pain Goal: Patients Stated Pain Goal: 4 (04/10/16 1645)               Riki Sheer

## 2016-04-11 NOTE — Progress Notes (Signed)
MOB was referred for history of depression/anxiety. * Referral screened out by Clinical Social Worker because none of the following criteria appear to apply: ~ History of anxiety/depression during this pregnancy, or of post-partum depression. ~ Diagnosis of anxiety and/or depression within last 3 years OR * MOB's symptoms currently being treated with medication and/or therapy.  CSW completed chart review and there were no indications of MH concerns prenatally.  MOB reported that MOB's OBGYN was concerned about 4 years ago but MOB was not concerned. CSW educated MOB about PPD. CSW informed MOB of possible supports and interventions to decrease PPD.  CSW also encouraged MOB to seek medical attention if needed for increased signs and symptoms for PPD. CSW provided MOB with a PPD checklist and encouraged MOB to utilize it weekly.  No other psychosocial stressors indicated at this time.   Laurey Arrow, MSW, LCSW Clinical Social Work [phone]

## 2016-04-12 MED ORDER — NORETHINDRONE 0.35 MG PO TABS: 1.0000 | ORAL_TABLET | Freq: Every day | ORAL | 11 refills | Status: DC

## 2016-04-12 MED ORDER — PRAMOXINE HCL 1 % RE FOAM: 1.0000 "application " | Freq: Three times a day (TID) | RECTAL | 0 refills | Status: DC | PRN

## 2016-04-12 MED ORDER — MEDROXYPROGESTERONE ACETATE 150 MG/ML IM SUSP: 150.0000 mg | Freq: Once | INTRAMUSCULAR | Status: DC

## 2016-04-12 MED ORDER — IBUPROFEN 600 MG PO TABS: 600.0000 mg | ORAL_TABLET | Freq: Four times a day (QID) | ORAL | 0 refills | Status: DC

## 2016-04-12 NOTE — Discharge Instructions (Signed)
Contraception Choices Contraception, also called birth control, means things to use or ways to try not to get pregnant. Hormonal birth control  This kind of birth control uses hormones. Here are some types of hormonal birth control:  A tube that is put under skin of the arm (implant). The tube can stay in for as long as 3 years.  Shots to get every 3 months (injections).  Pills to take every day (birth control pills).  A patch to change 1 time each week for 3 weeks (birth control patch). After that, the patch is taken off for 1 week.  A ring to put in the vagina. The ring is left in for 3 weeks. Then it is taken out of the vagina for 1 week. Then a new ring is put in.  Pills to take after unprotected sex (emergency birth control pills). Barrier birth control  Here are some types of barrier birth control:  A thin covering that is put on the penis before sex (female condom). The covering is thrown away after sex.  A soft, loose covering that is put in the vagina before sex (female condom). The covering is thrown away after sex.  A rubber bowl that sits over the cervix (diaphragm). The bowl must be made for you. The bowl is put into the vagina before sex. The bowl is left in for 6-8 hours after sex. It is taken out within 24 hours.  A small, soft cup that fits over the cervix (cervical cap). The cup must be made for you. The cup can be left in for 6-8 hours after sex. It is taken out within 48 hours.  A sponge that is put into the vagina before sex. It must be left in for at least 6 hours after sex. It must be taken out within 30 hours. Then it is thrown away.  A chemical that kills or stops sperm from getting into the uterus (spermicide). It may be a pill, cream, jelly, or foam to put in the vagina. The chemical should be used at least 10-15 minutes before sex. IUD (intrauterine) birth control An IUD is a small, T-shaped piece of plastic. It is put inside the uterus. There are two  kinds:  Hormone IUD. This kind can stay in for 3-5 years.  Copper IUD. This kind can stay in for 10 years. Permanent birth control Here are some types of permanent birth control:  Surgery to block the fallopian tubes.  Having an insert put into each fallopian tube.  Surgery to tie off the tubes that carry sperm (vasectomy). Natural planning birth control Here are some types of natural planning birth control:  Not having sex on the days the woman could get pregnant.  Using a calendar:  To keep track of the length of each period.  To find out what days pregnancy can happen.  To plan to not have sex on days when pregnancy can happen.  Watching for symptoms of ovulation and not having sex during ovulation. One way the woman can check for ovulation is to check her temperature.  Waiting to have sex until after ovulation. Summary  Contraception, also called birth control, means things to use or ways to try not to get pregnant.  Hormonal methods of birth control include implants, injections, pills, patches, vaginal rings, and emergency birth control pills.  Barrier methods of birth control can include female condoms, female condoms, diaphragms, cervical caps, sponges, and spermicides.  There are two types of IUD (intrauterine device)  birth control. An IUD can be put in a woman's uterus to prevent pregnancy for 3-5 years.  Permanent sterilization can be done through a procedure for males, females, or both.  Natural planning methods involve not having sex on the days when the woman could get pregnant. This information is not intended to replace advice given to you by your health care provider. Make sure you discuss any questions you have with your health care provider. Document Released: 10/31/2008 Document Revised: 01/14/2016 Document Reviewed: 01/14/2016 Elsevier Interactive Patient Education  2017 [place] Instructions for Mom  ACTIVITY  Gradually return to your  regular activities.  Let yourself rest. Nap while your baby sleeps.  Avoid lifting anything that is heavier than 10 lb (4.5 kg) until your health care provider says it is okay.  Avoid activities that take a lot of effort and energy (are strenuous) until approved by your health care provider. Walking at a slow-to-moderate pace is usually safe.  If you had a cesarean delivery:  Do not vacuum, climb stairs, or drive a car for 4-6 weeks.  Have someone help you at home until you feel like you can do your usual activities yourself.  Do exercises as told by your health care provider, if this applies. VAGINAL BLEEDING You may continue to bleed for 4-6 weeks after delivery. Over time, the amount of blood usually decreases and the color of the blood usually gets lighter. However, the flow of bright red blood may increase if you have been too active. If you need to use more than one pad in an hour because your pad gets soaked, or if you pass a large clot:  Lie down.  Raise your feet.  Place a cold compress on your lower abdomen.  Rest.  Call your health care provider. If you are breastfeeding, your period should return anytime between 8 weeks after delivery and the time that you stop breastfeeding. If you are not breastfeeding, your period should return 6-8 weeks after delivery. PERINEAL CARE The perineal area, or perineum, is the part of your body between your thighs. After delivery, this area needs special care. Follow these instructions as told by your health care provider.  Take warm tub baths for 15-20 minutes.  Use medicated pads and pain-relieving sprays and creams as told.  Do not use tampons or douches until vaginal bleeding has stopped.  Each time you go to the bathroom:  Use a peri bottle.  Change your pad.  Use towelettes in place of toilet paper until your stitches have healed.  Do Kegel exercises every day. Kegel exercises help to maintain the muscles that support the  vagina, bladder, and bowels. You can do these exercises while you are standing, sitting, or lying down. To do Kegel exercises:  Tighten the muscles of your abdomen and the muscles that surround your birth canal.  Hold for a few seconds.  Relax.  Repeat until you have done this 5 times in a row.  To prevent hemorrhoids from developing or getting worse:  Drink enough fluid to keep your urine clear or pale yellow.  Avoid straining when having a bowel movement.  Take over-the-counter medicines and stool softeners as told by your health care provider. BREAST CARE  Wear a tight-fitting bra.  Avoid taking over-the-counter pain medicine for breast discomfort.  Apply ice to the breasts to help with discomfort as needed:  Put ice in a plastic bag.  Place a towel between your skin and the bag.  Leave the ice on for 20 minutes or as told by your health care provider. NUTRITION  Eat a well-balanced diet.  Do not try to lose weight quickly by cutting back on calories.  Take your prenatal vitamins until your postpartum checkup or until your health care provider tells you to stop. POSTPARTUM DEPRESSION You may find yourself crying for no apparent reason and unable to cope with all of the changes that come with having a newborn. This mood is called postpartum depression. Postpartum depression happens because your hormone levels change after delivery. If you have postpartum depression, get support from your partner, friends, and family. If the depression does not go away on its own after several weeks, contact your health care provider. BREAST SELF-EXAM Do a breast self-exam each month, at the same time of the month. If you are breastfeeding, check your breasts just after a feeding, when your breasts are less full. If you are breastfeeding and your period has started, check your breasts on day 5, 6, or 7 of your period. Report any lumps, bumps, or discharge to your health care provider. Know  that breasts are normally lumpy if you are breastfeeding. This is temporary, and it is not a health risk. INTIMACY AND SEXUALITY Avoid sexual activity for at least 3-4 weeks after delivery or until the brownish-red vaginal flow is completely gone. If you want to avoid pregnancy, use some form of birth control. You can get pregnant after delivery, even if you have not had your period. SEEK MEDICAL CARE IF:  You feel unable to cope with the changes that a child brings to your life, and these feelings do not go away after several weeks.  You notice a lump, a bump, or discharge on your breast. SEEK IMMEDIATE MEDICAL CARE IF:  Blood soaks your pad in 1 hour or less.  You have:  Severe pain or cramping in your lower abdomen.  A bad-smelling vaginal discharge.  A fever that is not controlled by medicine.  A fever, and an area of your breast is red and sore.  Pain or redness in your calf.  Sudden, severe chest pain.  Shortness of breath.  Painful or bloody urination.  Problems with your vision.  You vomit for 12 hours or longer.  You develop a severe headache.  You have serious thoughts about hurting yourself, your child, or anyone else. This information is not intended to replace advice given to you by your health care provider. Make sure you discuss any questions you have with your health care provider. Document Released: 01/01/2000 Document Revised: 06/11/2015 Document Reviewed: 07/07/2014 Elsevier Interactive Patient Education  2017 [employer].

## 2016-04-12 NOTE — Discharge Summary (Signed)
OB Discharge Summary  Patient Name: Deanna Hughes DOB: [birth year] MRN: [id number]  Date of admission: 04/10/2016 Delivering MD: Aura Camps   Date of discharge: 04/12/2016  Admitting diagnosis: 40.1 WKS, CTXS Intrauterine pregnancy: [redacted]w[redacted]d     Secondary diagnosis:Active Problems:   Normal labor     Discharge diagnosis: Term Pregnancy Delivered                                                                      Augmentation: AROM  Complications: None  Hospital course:  Onset of Labor With Vaginal Delivery     26 y.o. yo [id number] at [redacted]w[redacted]d was admitted in Active Labor on 04/10/2016. Patient had an uncomplicated labor course as follows:  Membrane Rupture Time/Date: 4:00 PM ,04/10/2016   Intrapartum Procedures: Episiotomy: None [1]                                         Lacerations:  None [1]  Patient had a delivery of a Viable infant. 04/10/2016  Information for the patient's newborn:  [name], [name] [[id number]]  Delivery Method: Vag-Spont    Pateint had an uncomplicated postpartum course.  She is ambulating, tolerating a regular diet, passing flatus, and urinating well. Patient is discharged home in stable condition on 04/12/16.   Physical exam  Vitals:   04/11/16 0605 04/11/16 0900 04/11/16 1807 04/12/16 0500  BP: (!) 107/53 (!) 100/43 (!) 98/58 107/65  Pulse: 63 63 72 70  Resp: 18 16 19 16   Temp: 98.2 F (36.8 C) 98.2 F (36.8 C) 97.8 F (36.6 C) 97.7 F (36.5 C)  TempSrc: Oral  Oral Oral  SpO2:      Weight:      Height:       General: alert Lochia: appropriate Uterine Fundus: firm at U Incision: N/A DVT Evaluation: No evidence of DVT seen on physical exam. Labs: Lab Results  Component Value Date   WBC 16.4 (H) 04/10/2016   HGB 12.0 04/10/2016   HCT 34.6 (L) 04/10/2016   MCV 87.4 04/10/2016   PLT 144 (L) 04/10/2016   CMP Latest Ref Rng & Units 08/10/2015  Glucose 65 - 99 mg/dL 90  BUN 6 - 20 mg/dL 7  Creatinine 0.44 - 1.00 mg/dL 0.66   Sodium 135 - 145 mmol/L 133(L)  Potassium 3.5 - 5.1 mmol/L 2.9(L)  Chloride 101 - 111 mmol/L 107  CO2 22 - 32 mmol/L 23  Calcium 8.9 - 10.3 mg/dL 8.5(L)  Total Protein 6.0 - 8.3 g/dL -  Total Bilirubin 0.3 - 1.2 mg/dL -  Alkaline Phos 39 - 117 U/L -  AST 0 - 37 U/L -  ALT 0 - 35 U/L -    Discharge instruction: per After Visit Summary and "Baby and Me Booklet".  After Visit Meds:  Allergies as of 04/12/2016   No Known Allergies     Medication List    TAKE these medications   acetaminophen 325 MG tablet Commonly known as:  TYLENOL Take 650 mg by mouth every 6 (six) hours as needed for mild pain, moderate pain, fever or headache.   ibuprofen 600  MG tablet Commonly known as:  ADVIL,MOTRIN Take 1 tablet (600 mg total) by mouth every 6 (six) hours.   pramoxine 1 % foam Commonly known as:  PROCTOFOAM Place 1 application rectally 3 (three) times daily as needed for itching.   PRENATE MINI 18-0.6-0.4-350 MG Caps Take 1 capsule by mouth daily.       Diet: routine diet  Activity: Advance as tolerated. Pelvic rest for 6 weeks.   Outpatient follow up:6 weeks Follow up Appt:Future Appointments Date Time Provider [place]  05/20/2016 9:30 AM Roma Schanz, CNM [facility]   Follow up visit: No Follow-up on file.  Postpartum contraception: Depo Provera prior to discharge home  Newborn Data: Live born female  Birth Weight: 7 lb 14.1 oz (3575 g) APGAR: 9, 9  Baby Feeding: Bottle Disposition:home with mother   04/12/2016 Emily Filbert, MD

## 2016-04-16 ENCOUNTER — Inpatient Hospital Stay (HOSPITAL_COMMUNITY): Admission: RE | Admit: 2016-04-16 | Payer: BLUE CROSS/BLUE SHIELD | Source: Ambulatory Visit

## 2016-04-19 ENCOUNTER — Other Ambulatory Visit (HOSPITAL_COMMUNITY): Payer: Self-pay | Admitting: Obstetrics & Gynecology

## 2016-04-20 ENCOUNTER — Encounter: Payer: Self-pay | Admitting: Advanced Practice Midwife

## 2016-04-20 ENCOUNTER — Ambulatory Visit (INDEPENDENT_AMBULATORY_CARE_PROVIDER_SITE_OTHER): Payer: BLUE CROSS/BLUE SHIELD | Admitting: Advanced Practice Midwife

## 2016-04-20 VITALS — BP 120/80 | HR 80 | Wt 148.0 lb

## 2016-04-20 DIAGNOSIS — N719 Inflammatory disease of uterus, unspecified: Secondary | ICD-10-CM

## 2016-04-20 MED ORDER — AMOXICILLIN-POT CLAVULANATE 875-125 MG PO TABS: 1.0000 | ORAL_TABLET | Freq: Two times a day (BID) | ORAL | 0 refills | Status: DC

## 2016-04-20 MED ORDER — ONDANSETRON HCL 4 MG PO TABS: 4.0000 mg | ORAL_TABLET | Freq: Three times a day (TID) | ORAL | 0 refills | Status: DC | PRN

## 2016-04-20 MED ORDER — NAPROXEN 500 MG PO TABS
500.0000 mg | ORAL_TABLET | Freq: Two times a day (BID) | ORAL | 1 refills | Status: DC
Start: 2016-04-20 — End: 2016-05-20

## 2016-04-20 NOTE — Patient Instructions (Addendum)
General Surgeon:  (for hernia)  Dr. Ronnell Freshwater [phone]

## 2016-04-20 NOTE — Progress Notes (Signed)
[facility] Visit  Patient name: Deanna Hughes MRN [id number]  Date of birth: June 28, 1990  CC & HPI:  Deanna Hughes is a 26 y.o.  female presenting today for stomach pain and nausea.  Had a NSVD 04/10/16. Over the past 4 days, she has had "sharp pain" ijn lower abdomen, "gushes of blood" w/the pain. Also has constant nausea, breasts are still engorged.  Denies fever.   Pertinent History Reviewed:  Medical & Surgical Hx:   Past Medical History:  Diagnosis Date  . BV (bacterial vaginosis) 04/02/2015  . Chlamydia    treated 6/6  . Chronic back pain   . Chronic headaches   . Contraceptive management 01/25/2013  . Depression   . Encounter for Nexplanon removal 02/27/2015  . Irregular bleeding 06/03/2013  . Miscarriage 03/27/2013  . Nexplanon insertion 04/11/2013   nexplanon inserted 04/11/13 remove 3/32/18  . Right sided abdominal pain   . Scoliosis   . Seasonal allergies 07/03/2012  . Sinus infection 08/27/2013  . Supervision of normal pregnancy 08/27/2015    [facility] Initiated Care at   7+5 weeks  FOB  Dating By  LMP  Pap  GC/CT Initial:                36+wks: Genetic Screen NT/IT:  CF screen  Anatomic [place]  Flu vaccine  Tdap Recommended ~ 28wks Glucose Screen  2 hr GBS  Feed Preference  Contraception  Circumcision  Childbirth Classes  Pediatrician    . Threatened abortion in early pregnancy 03/20/2013  . Vaginal discharge 10/03/2012  . Yeast infection 07/03/2012   Past Surgical History:  Procedure Laterality Date  . NO PAST SURGERIES     Family History  Problem Relation Age of Onset  . Asthma Mother   . Asthma Sister   . Asthma Brother   . Asthma Daughter   . Diabetes Maternal Grandmother   . Hypertension Maternal Grandmother   . Asthma Maternal Grandmother   . Heart disease Maternal Grandmother   . Stroke Maternal Grandmother   . Hypertension Maternal Grandfather   . Asthma Maternal Grandfather   . Heart disease Maternal Grandfather   . Asthma Son      Current Outpatient Prescriptions:  .  ibuprofen (ADVIL,MOTRIN) 600 MG tablet, Take 1 tablet (600 mg total) by mouth every 6 (six) hours., Disp: 30 tablet, Rfl: 0 .  Prenat-FeCbn-FeAsp-Meth-FA-DHA (PRENATE MINI) 18-0.6-0.4-350 MG CAPS, Take 1 capsule by mouth daily., Disp: 30 capsule, Rfl: 11 .  acetaminophen (TYLENOL) 325 MG tablet, Take 650 mg by mouth every 6 (six) hours as needed for mild pain, moderate pain, fever or headache. , Disp: , Rfl:  .  amoxicillin-clavulanate (AUGMENTIN) 875-125 MG tablet, Take 1 tablet by mouth 2 (two) times daily., Disp: 14 tablet, Rfl: 0 .  naproxen (NAPROSYN) 500 MG tablet, Take 1 tablet (500 mg total) by mouth 2 (two) times daily with a meal., Disp: 30 tablet, Rfl: 1 .  norethindrone (ORTHO MICRONOR) 0.35 MG tablet, Take 1 tablet (0.35 mg total) by mouth daily. (Patient not taking: Reported on 04/20/2016), Disp: 1 Package, Rfl: 11 .  ondansetron (ZOFRAN) 4 MG tablet, Take 1 tablet (4 mg total) by mouth every 8 (eight) hours as needed for nausea or vomiting., Disp: 20 tablet, Rfl: 0 .  pramoxine (PROCTOFOAM) 1 % foam, Place 1 application rectally 3 (three) times daily as needed for itching. (Patient not taking: Reported on 04/20/2016), Disp: 15 g, Rfl: 0 Social History: Reviewed -  reports that she has never smoked. She has never used smokeless tobacco.  Review of Systems:   Constitutional: Negative for fever and chills Eyes: Negative for visual disturbances Respiratory: Negative for shortness of breath, dyspnea Cardiovascular: Negative for chest pain or palpitations  Gastrointestinal: Negative for diarrhea and constipation; Genitourinary: Negative for dysuria and urgency, vaginal irritation or itching Musculoskeletal: Negative for  joint pain, myalgias  Neurological: Negative for dizziness and headaches    Objective Findings:    Physical Examination: General appearance - well appearing, and in no distress Mental status - alert, oriented to person,  place, and time Chest:  Normal respiratory effort Breasts:  No erythema but still engorged Heart - normal rate and regular rhythm Abdomen:  Soft,tender over lower abdo Pelvic: Small amount of blood, no odor. Tender to bimanual Musculoskeletal:  Normal range of motion without pain Extremities:  No edema    No results found for this or any previous visit (from the past 24 hour(s)).    Assessment & Plan:  A:   Endometritis Breast engorgement P:  augmentin 875mg  BID zofran prn Naprosyn prn    Return for As scheduled.  CRESENZO-DISHMAN,Nesta Kimple CNM 04/20/2016 4:49 PM

## 2016-04-25 ENCOUNTER — Other Ambulatory Visit: Payer: Self-pay | Admitting: *Deleted

## 2016-04-26 ENCOUNTER — Other Ambulatory Visit: Payer: Self-pay | Admitting: *Deleted

## 2016-05-04 ENCOUNTER — Other Ambulatory Visit (HOSPITAL_COMMUNITY): Payer: Self-pay | Admitting: *Deleted

## 2016-05-04 DIAGNOSIS — R519 Headache, unspecified: Secondary | ICD-10-CM

## 2016-05-04 DIAGNOSIS — R51 Headache: Principal | ICD-10-CM

## 2016-05-12 ENCOUNTER — Ambulatory Visit (HOSPITAL_COMMUNITY): Payer: BLUE CROSS/BLUE SHIELD

## 2016-05-12 ENCOUNTER — Encounter (HOSPITAL_COMMUNITY): Payer: Self-pay

## 2016-05-20 ENCOUNTER — Encounter: Payer: Self-pay | Admitting: Women's Health

## 2016-05-20 ENCOUNTER — Ambulatory Visit (INDEPENDENT_AMBULATORY_CARE_PROVIDER_SITE_OTHER): Payer: BLUE CROSS/BLUE SHIELD | Admitting: Women's Health

## 2016-05-20 DIAGNOSIS — Z3202 Encounter for pregnancy test, result negative: Secondary | ICD-10-CM | POA: Diagnosis not present

## 2016-05-20 DIAGNOSIS — R1030 Lower abdominal pain, unspecified: Secondary | ICD-10-CM

## 2016-05-20 LAB — POCT URINE PREGNANCY: PREG TEST UR: NEGATIVE

## 2016-05-20 NOTE — Progress Notes (Signed)
Subjective:    Deanna Hughes is a 26 y.o. [phone] African American female who presents for a postpartum visit. She is 5 weeks postpartum following a spontaneous vaginal delivery at 40.1 gestational weeks. Anesthesia: epidural. I have fully reviewed the prenatal and intrapartum course. Postpartum course has been uncomplicated. Baby's course has been uncomplicated. Baby is feeding by bottle. Bleeding no bleeding. Bowel function is normal. Bladder function is normal. Patient is not sexually active. Last sexual activity: prior to birth of baby. Contraception method is wants nexplanon. Postpartum depression screening: negative. Score 0.  Last pap 02/06/14 and was neg. Reports low back pain, lower abdominal pain/cramping- was treated for pp endometritis earlier but pain didn't improve. Bump on Rt breast that popped and is better.   The following portions of the patient's history were reviewed and updated as appropriate: allergies, current medications, past medical history, past surgical history and problem list.  Review of Systems Pertinent items are noted in HPI.   Vitals:   05/20/16 0936  BP: 96/60  Pulse: 76  Weight: 148 lb (67.1 kg)  Height: 5\' 2"  (1.575 m)   Patient's last menstrual period was 07/10/2015.  Objective:   General:  alert, cooperative and no distress   Breasts:  Appears to be resolved boil on Rt breast  Lungs: clear to auscultation bilaterally  Heart:  regular rate and rhythm  Abdomen: soft, nontender   Vulva: normal  Vagina: normal vagina  Cervix:  closed  Corpus: Well-involuted, some tenderness to palpation  Adnexa:  Non-palpable  Rectal Exam: No hemorrhoids        Assessment:   Postpartum exam 5 wks s/p SVB Bottlefeeding Lower abdominal pain Lower back pain Resolved Rt breast boil Depression screening Contraception counseling   Plan:  Contraception: abstinence until nexplanon, order today Follow up in: 3 weeks for nexplanon insertion, or earlier if  needed Let us know if lower abdominal pain not improving- will get pelvic u/s  Tawnya Crook CNM, WHNP-BC 05/20/2016 9:52 AM

## 2016-05-20 NOTE — Patient Instructions (Signed)
NO SEX UNTIL AFTER YOU GET YOUR BIRTH CONTROL   Let [place] know if lower abdominal pain not improving or if area on breast returns  Etonogestrel implant What is this medicine? ETONOGESTREL (et oh noe JES trel) is a contraceptive (birth control) device. It is used to prevent pregnancy. It can be used for up to 3 years. This medicine may be used for other purposes; ask your health care provider or pharmacist if you have questions. COMMON BRAND NAME(S): Implanon, Nexplanon What should I tell my health care provider before I take this medicine? They need to know if you have any of these conditions: -abnormal vaginal bleeding -blood vessel disease or blood clots -cancer of the breast, cervix, or liver -depression -diabetes -gallbladder disease -headaches -heart disease or recent heart attack -high blood pressure -high cholesterol -kidney disease -liver disease -renal disease -seizures -tobacco smoker -an unusual or allergic reaction to etonogestrel, other hormones, anesthetics or antiseptics, medicines, foods, dyes, or preservatives -pregnant or trying to get pregnant -breast-feeding How should I use this medicine? This device is inserted just under the skin on the inner side of your upper arm by a health care professional. Talk to your pediatrician regarding the use of this medicine in children. Special care may be needed. Overdosage: If you think you have taken too much of this medicine contact a poison control center or emergency room at once. NOTE: This medicine is only for you. Do not share this medicine with others. What if I miss a dose? This does not apply. What may interact with this medicine? Do not take this medicine with any of the following medications: -amprenavir -bosentan -fosamprenavir This medicine may also interact with the following medications: -barbiturate medicines for inducing sleep or treating seizures -certain medicines for fungal infections like  ketoconazole and itraconazole -grapefruit juice -griseofulvin -medicines to treat seizures like carbamazepine, felbamate, oxcarbazepine, phenytoin, topiramate -modafinil -phenylbutazone -rifampin -rufinamide -some medicines to treat HIV infection like atazanavir, indinavir, lopinavir, nelfinavir, tipranavir, ritonavir -St. John's wort This list may not describe all possible interactions. Give your health care provider a list of all the medicines, herbs, non-prescription drugs, or dietary supplements you use. Also tell them if you smoke, drink alcohol, or use illegal drugs. Some items may interact with your medicine. What should I watch for while using this medicine? This product does not protect you against HIV infection (AIDS) or other sexually transmitted diseases. You should be able to feel the implant by pressing your fingertips over the skin where it was inserted. Contact your doctor if you cannot feel the implant, and use a non-hormonal birth control method (such as condoms) until your doctor confirms that the implant is in place. If you feel that the implant may have broken or become bent while in your arm, contact your healthcare provider. What side effects may I notice from receiving this medicine? Side effects that you should report to your doctor or health care professional as soon as possible: -allergic reactions like skin rash, itching or hives, swelling of the face, lips, or tongue -breast lumps -changes in emotions or moods -depressed mood -heavy or prolonged menstrual bleeding -pain, irritation, swelling, or bruising at the insertion site -scar at site of insertion -signs of infection at the insertion site such as fever, and skin redness, pain or discharge -signs of pregnancy -signs and symptoms of a blood clot such as breathing problems; changes in vision; chest pain; severe, sudden headache; pain, swelling, warmth in the leg; trouble speaking; sudden numbness or  weakness of  the face, arm or leg -signs and symptoms of liver injury like dark yellow or brown urine; general ill feeling or flu-like symptoms; light-colored stools; loss of appetite; nausea; right upper belly pain; unusually weak or tired; yellowing of the eyes or skin -unusual vaginal bleeding, discharge -signs and symptoms of a stroke like changes in vision; confusion; trouble speaking or understanding; severe headaches; sudden numbness or weakness of the face, arm or leg; trouble walking; dizziness; loss of balance or coordination Side effects that usually do not require medical attention (report to your doctor or health care professional if they continue or are bothersome): -acne -back pain -breast pain -changes in weight -dizziness -general ill feeling or flu-like symptoms -headache -irregular menstrual bleeding -nausea -sore throat -vaginal irritation or inflammation This list may not describe all possible side effects. Call your doctor for medical advice about side effects. You may report side effects to FDA at 1-800-FDA-1088. Where should I keep my medicine? This drug is given in a hospital or clinic and will not be stored at home. NOTE: This sheet is a summary. It may not cover all possible information. If you have questions about this medicine, talk to your doctor, pharmacist, or health care provider.  2018 Elsevier/Gold Standard (2015-07-23 11:19:22)

## 2016-06-02 ENCOUNTER — Other Ambulatory Visit: Payer: Self-pay | Admitting: Women's Health

## 2016-06-10 ENCOUNTER — Encounter: Payer: BLUE CROSS/BLUE SHIELD | Admitting: Women's Health

## 2016-06-22 ENCOUNTER — Encounter: Payer: BLUE CROSS/BLUE SHIELD | Admitting: Advanced Practice Midwife

## 2016-06-30 ENCOUNTER — Ambulatory Visit (INDEPENDENT_AMBULATORY_CARE_PROVIDER_SITE_OTHER): Payer: BLUE CROSS/BLUE SHIELD | Admitting: Obstetrics and Gynecology

## 2016-06-30 ENCOUNTER — Encounter: Payer: Self-pay | Admitting: Obstetrics and Gynecology

## 2016-06-30 VITALS — BP 104/64 | HR 96 | Wt 154.0 lb

## 2016-06-30 DIAGNOSIS — Z3046 Encounter for surveillance of implantable subdermal contraceptive: Secondary | ICD-10-CM

## 2016-06-30 DIAGNOSIS — Z3202 Encounter for pregnancy test, result negative: Secondary | ICD-10-CM | POA: Diagnosis not present

## 2016-06-30 LAB — POCT URINE PREGNANCY: Preg Test, Ur: NEGATIVE

## 2016-06-30 NOTE — Progress Notes (Signed)
Patient ID: Deanna Hughes, female   DOB: 11-Apr-1990, 26 y.o.   MRN: [id number]   [place] PROCEDURE NOTE  HOLLACE MICHELLI is a 26 y.o. [phone] here for Nexplanon insertion. No other gynecologic concerns.Patient's last menstrual period was 06/05/2016 (exact date). Abstinent x 4 wk  Nexplanon Insertion Procedure Patient identified, informed consent performed, consent signed.   Patient does understand that irregular bleeding is a very common side effect of this medication. She was advised to have backup contraception for one week after placement. Pregnancy test in clinic today was negative.  Appropriate time out taken.  Patient's left arm was prepped and draped in the usual sterile fashion. The ruler used to measure and mark insertion area.  Patient was prepped with alcohol swab and then injected with 3 ml of 1% lidocaine.  She was prepped with betadine, Nexplanon removed from packaging,  Device confirmed in needle, then inserted full length of needle and withdrawn per handbook instructions. Nexplanon was able to palpated in the patient's arm; patient palpated the insert herself. There was minimal blood loss.  Patient insertion site covered with guaze and a pressure bandage to reduce any bruising.  The patient tolerated the procedure well and was given post procedure instructions.    By signing my name below, I, Hansel Feinstein, attest that this documentation has been prepared under the direction and in the presence of Jonnie Kind, MD. Electronically Signed: Hansel Feinstein, ED Scribe. 06/30/16. 2:32 PM.  I personally performed the services described in this documentation, which was SCRIBED in my presence. The recorded information has been reviewed and considered accurate. It has been edited as necessary during review. Jonnie Kind, MD

## 2016-06-30 NOTE — Patient Instructions (Signed)
Etonogestrel implant What is this medicine? ETONOGESTREL (et oh noe JES trel) is a contraceptive (birth control) device. It is used to prevent pregnancy. It can be used for up to 3 years. This medicine may be used for other purposes; ask your health care provider or pharmacist if you have questions. COMMON BRAND NAME(S): Implanon, Nexplanon What should I tell my health care provider before I take this medicine? They need to know if you have any of these conditions: -abnormal vaginal bleeding -blood vessel disease or blood clots -cancer of the breast, cervix, or liver -depression -diabetes -gallbladder disease -headaches -heart disease or recent heart attack -high blood pressure -high cholesterol -kidney disease -liver disease -renal disease -seizures -tobacco smoker -an unusual or allergic reaction to etonogestrel, other hormones, anesthetics or antiseptics, medicines, foods, dyes, or preservatives -pregnant or trying to get pregnant -breast-feeding How should I use this medicine? This device is inserted just under the skin on the inner side of your upper arm by a health care professional. Talk to your pediatrician regarding the use of this medicine in children. Special care may be needed. Overdosage: If you think you have taken too much of this medicine contact a poison control center or emergency room at once. NOTE: This medicine is only for you. Do not share this medicine with others. What if I miss a dose? This does not apply. What may interact with this medicine? Do not take this medicine with any of the following medications: -amprenavir -bosentan -fosamprenavir This medicine may also interact with the following medications: -barbiturate medicines for inducing sleep or treating seizures -certain medicines for fungal infections like ketoconazole and itraconazole -grapefruit juice -griseofulvin -medicines to treat seizures like carbamazepine, felbamate, oxcarbazepine,  phenytoin, topiramate -modafinil -phenylbutazone -rifampin -rufinamide -some medicines to treat HIV infection like atazanavir, indinavir, lopinavir, nelfinavir, tipranavir, ritonavir -St. [place] Helms's wort This list may not describe all possible interactions. Give your health care provider a list of all the medicines, herbs, non-prescription drugs, or dietary supplements you use. Also tell them if you smoke, drink alcohol, or use illegal drugs. Some items may interact with your medicine. What should I watch for while using this medicine? This product does not protect you against HIV infection (AIDS) or other sexually transmitted diseases. You should be able to feel the implant by pressing your fingertips over the skin where it was inserted. Contact your doctor if you cannot feel the implant, and use a non-hormonal birth control method (such as condoms) until your doctor confirms that the implant is in place. If you feel that the implant may have broken or become bent while in your arm, contact your healthcare provider. What side effects may I notice from receiving this medicine? Side effects that you should report to your doctor or health care professional as soon as possible: -allergic reactions like skin rash, itching or hives, swelling of the face, lips, or tongue -breast lumps -changes in emotions or moods -depressed mood -heavy or prolonged menstrual bleeding -pain, irritation, swelling, or bruising at the insertion site -scar at site of insertion -signs of infection at the insertion site such as fever, and skin redness, pain or discharge -signs of pregnancy -signs and symptoms of a blood clot such as breathing problems; changes in vision; chest pain; severe, sudden headache; pain, swelling, warmth in the leg; trouble speaking; sudden numbness or weakness of the face, arm or leg -signs and symptoms of liver injury like dark yellow or brown urine; general ill feeling or flu-like symptoms;  light-colored   stools; loss of appetite; nausea; right upper belly pain; unusually weak or tired; yellowing of the eyes or skin -unusual vaginal bleeding, discharge -signs and symptoms of a stroke like changes in vision; confusion; trouble speaking or understanding; severe headaches; sudden numbness or weakness of the face, arm or leg; trouble walking; dizziness; loss of balance or coordination Side effects that usually do not require medical attention (report to your doctor or health care professional if they continue or are bothersome): -acne -back pain -breast pain -changes in weight -dizziness -general ill feeling or flu-like symptoms -headache -irregular menstrual bleeding -nausea -sore throat -vaginal irritation or inflammation This list may not describe all possible side effects. Call your doctor for medical advice about side effects. You may report side effects to FDA at 1-800-FDA-1088. Where should I keep my medicine? This drug is given in a hospital or clinic and will not be stored at home. NOTE: This sheet is a summary. It may not cover all possible information. If you have questions about this medicine, talk to your doctor, pharmacist, or health care provider.  2018 Elsevier/Gold Standard (2015-07-23 11:19:22)  

## 2016-08-11 ENCOUNTER — Ambulatory Visit: Payer: BLUE CROSS/BLUE SHIELD | Admitting: Advanced Practice Midwife

## 2016-08-18 ENCOUNTER — Ambulatory Visit: Payer: BLUE CROSS/BLUE SHIELD | Admitting: Adult Health

## 2016-08-24 ENCOUNTER — Ambulatory Visit: Payer: BLUE CROSS/BLUE SHIELD | Admitting: Advanced Practice Midwife

## 2016-08-26 ENCOUNTER — Ambulatory Visit: Payer: BLUE CROSS/BLUE SHIELD | Admitting: Adult Health

## 2016-09-14 ENCOUNTER — Encounter: Payer: Self-pay | Admitting: Advanced Practice Midwife

## 2016-09-14 ENCOUNTER — Telehealth: Payer: Self-pay | Admitting: *Deleted

## 2016-09-14 ENCOUNTER — Ambulatory Visit (INDEPENDENT_AMBULATORY_CARE_PROVIDER_SITE_OTHER): Payer: 59 | Admitting: Advanced Practice Midwife

## 2016-09-14 VITALS — BP 102/60 | HR 96 | Ht 62.0 in | Wt 167.0 lb

## 2016-09-14 DIAGNOSIS — K4091 Unilateral inguinal hernia, without obstruction or gangrene, recurrent: Secondary | ICD-10-CM | POA: Diagnosis not present

## 2016-09-14 DIAGNOSIS — B379 Candidiasis, unspecified: Secondary | ICD-10-CM | POA: Diagnosis not present

## 2016-09-14 MED ORDER — FLUCONAZOLE 150 MG PO TABS: ORAL_TABLET | ORAL | 2 refills | Status: DC

## 2016-09-14 NOTE — Telephone Encounter (Signed)
[facility] called and informed that Manus Gunning ordered a non urgent surgical consult for inguinal hernia. Appt for Thursday Sept 27, 2018 @ 11:00 received. Pt notified of appt time and informed her if she needs to change the appt that she can reach them at [phone]. Pt verbalized understanding and confirmed appt time.

## 2016-09-14 NOTE — Progress Notes (Signed)
[facility] Visit  Patient name: Deanna Hughes MRN [id number]  Date of birth: Oct 24, 1990  CC & HPI:  Deanna Hughes is a 26 y.o. African American female presenting today for  C/o increased vaginal dc after taking abx for tooth, some odor/itch.  Also wants surgical consult re hernia  Pertinent History Reviewed:  Medical & Surgical Hx:   Past Medical History:  Diagnosis Date  . BV (bacterial vaginosis) 04/02/2015  . Chlamydia    treated 6/6  . Chronic back pain   . Chronic headaches   . Contraceptive management 01/25/2013  . Depression   . Encounter for Nexplanon removal 02/27/2015  . Irregular bleeding 06/03/2013  . Miscarriage 03/27/2013  . Nexplanon insertion 04/11/2013   nexplanon inserted 04/11/13 remove 3/32/18  . Right sided abdominal pain   . Scoliosis   . Seasonal allergies 07/03/2012  . Sinus infection 08/27/2013  . Supervision of normal pregnancy 08/27/2015    [facility] at   7+5 weeks  FOB  Dating By  LMP  Pap  GC/CT Initial:                36+wks: Genetic Screen NT/IT:  CF screen  Anatomic [place]  Flu vaccine  Tdap Recommended ~ 28wks Glucose Screen  2 hr GBS  Feed Preference  Contraception  Circumcision  Childbirth Classes  Pediatrician    . Threatened abortion in early pregnancy 03/20/2013  . Vaginal discharge 10/03/2012  . Yeast infection 07/03/2012   Past Surgical History:  Procedure Laterality Date  . NO PAST SURGERIES     Family History  Problem Relation Age of Onset  . Asthma Mother   . Asthma Sister   . Asthma Brother   . Asthma Daughter   . Diabetes Maternal Grandmother   . Hypertension Maternal Grandmother   . Asthma Maternal Grandmother   . Heart disease Maternal Grandmother   . Stroke Maternal Grandmother   . Hypertension Maternal Grandfather   . Asthma Maternal Grandfather   . Heart disease Maternal Grandfather   . Asthma Son     Current Outpatient Prescriptions:  .  acetaminophen (TYLENOL) 325 MG tablet, Take 650 mg  by mouth every 6 (six) hours as needed for mild pain, moderate pain, fever or headache. , Disp: , Rfl:  .  fluticasone (FLONASE) 50 MCG/ACT nasal spray, Place 2 sprays into both nostrils daily. , Disp: , Rfl:  .  fluconazole (DIFLUCAN) 150 MG tablet, 1 po stat; repeat in 3 days, Disp: 2 tablet, Rfl: 2 .  loratadine (CLARITIN) 10 MG tablet, TK 1 T PO D, Disp: , Rfl: 6 .  Prenat-FeCbn-FeAsp-Meth-FA-DHA (PRENATE MINI) 18-0.6-0.4-350 MG CAPS, Take 1 capsule by mouth daily. (Patient not taking: Reported on 06/30/2016), Disp: 30 capsule, Rfl: 11 Social History: Reviewed -  reports that she has never smoked. She has never used smokeless tobacco.  Review of Systems:   Constitutional: Negative for fever and chills Eyes: Negative for visual disturbances Respiratory: Negative for shortness of breath, dyspnea Cardiovascular: Negative for chest pain or palpitations  Gastrointestinal: Negative for vomiting, diarrhea and constipation; no abdominal pain Genitourinary: Negative for dysuria and urgency, vaginal irritation or itching Musculoskeletal: Negative for back pain, joint pain, myalgias  Neurological: Negative for dizziness and headaches    Objective Findings:    Physical Examination: General appearance - well appearing, and in no distress Mental status - alert, oriented to person, place, and time Chest:  Normal respiratory effort Heart - normal rate  and regular rhythm Abdomen:  Soft, nontender   Pelvic: small amount of thick white dc  Wet prep small yeast , few wbc, no clue. Right inguinal hernia, wants surgical referral Musculoskeletal:  Normal range of motion without pain Extremities:  No edema    No results found for this or any previous visit (from the past 24 hour(s)).    Assessment & Plan:  A:   yeast P:  rx diflucan Referral in EPIC to [facility] and sent to RN to call   Return for If you have any problems.  CRESENZO-DISHMAN,Josuha Fontanez CNM 09/14/2016 12:23  PM

## 2016-10-13 ENCOUNTER — Ambulatory Visit: Payer: BLUE CROSS/BLUE SHIELD | Admitting: General Surgery

## 2016-11-03 ENCOUNTER — Ambulatory Visit (INDEPENDENT_AMBULATORY_CARE_PROVIDER_SITE_OTHER): Payer: 59 | Admitting: General Surgery

## 2016-11-03 DIAGNOSIS — K4090 Unilateral inguinal hernia, without obstruction or gangrene, not specified as recurrent: Secondary | ICD-10-CM | POA: Diagnosis not present

## 2016-11-03 NOTE — Progress Notes (Signed)
Deanna Hughes; [id number]; 07/02/1990   HPI Patient is a 26 year old black female who is referred to my care by [facility], [place] for evaluation and treatment of a right internal hernia. Is been present for 3 years, but recently it is starting to protrude more and causing her discomfort. She has a pain a 5 out of 10. She states she is easily able to reduce it when she lies down. No nausea or vomiting have been noted. Past Medical History:  Diagnosis Date  . BV (bacterial vaginosis) 04/02/2015  . Chlamydia    treated 6/6  . Chronic back pain   . Chronic headaches   . Contraceptive management 01/25/2013  . Depression   . Encounter for Nexplanon removal 02/27/2015  . Irregular bleeding 06/03/2013  . Miscarriage 03/27/2013  . Nexplanon insertion 04/11/2013   nexplanon inserted 04/11/13 remove 3/32/18  . Right sided abdominal pain   . Scoliosis   . Seasonal allergies 07/03/2012  . Sinus infection 08/27/2013  . Supervision of normal pregnancy 08/27/2015    [facility]inic [facility] Care at   7+5 weeks  FOB  Dating By  LMP  Pap  GC/CT Initial:                36+wks: Genetic Screen NT/IT:  CF screen  Anatomic [place]  Flu vaccine  Tdap Recommended ~ 28wks Glucose Screen  2 hr GBS  Feed Preference  Contraception  Circumcision  Childbirth Classes  Pediatrician    . Threatened abortion in early pregnancy 03/20/2013  . Vaginal discharge 10/03/2012  . Yeast infection 07/03/2012    Past Surgical History:  Procedure Laterality Date  . NO PAST SURGERIES      Family History  Problem Relation Age of Onset  . Asthma Mother   . Asthma Sister   . Asthma Brother   . Asthma Daughter   . Diabetes Maternal Grandmother   . Hypertension Maternal Grandmother   . Asthma Maternal Grandmother   . Heart disease Maternal Grandmother   . Stroke Maternal Grandmother   . Hypertension Maternal Grandfather   . Asthma Maternal Grandfather   . Heart disease Maternal Grandfather   . Asthma Son      Current Outpatient Prescriptions on File Prior to Visit  Medication Sig Dispense Refill  . acetaminophen (TYLENOL) 325 MG tablet Take 650 mg by mouth every 6 (six) hours as needed for mild pain, moderate pain, fever or headache.     . fluconazole (DIFLUCAN) 150 MG tablet 1 po stat; repeat in 3 days 2 tablet 2  . fluticasone (FLONASE) 50 MCG/ACT nasal spray Place 2 sprays into both nostrils daily.     Marland Kitchen loratadine (CLARITIN) 10 MG tablet TK 1 T PO D  6  . Prenat-FeCbn-FeAsp-Meth-FA-DHA (PRENATE MINI) 18-0.6-0.4-350 MG CAPS Take 1 capsule by mouth daily. (Patient not taking: Reported on 06/30/2016) 30 capsule 11   No current facility-administered medications on file prior to visit.     No Known Allergies  History  Alcohol Use No    History  Smoking Status  . Never Smoker  Smokeless Tobacco  . Never Used    Review of Systems  Constitutional: Negative.   HENT: Negative.   Eyes: Negative.   Respiratory: Negative.   Cardiovascular: Negative.   Gastrointestinal: Negative.   Genitourinary: Negative.   Musculoskeletal: Negative.   Skin: Negative.   Neurological: Negative.   Endo/Heme/Allergies: Negative.   Psychiatric/Behavioral: Negative.     Objective   There were no vitals  filed for this visit.  Physical Exam  Constitutional: She is oriented to person, place, and time and well-developed, well-nourished, and in no distress.  HENT:  Head: Normocephalic and atraumatic.  Cardiovascular: Normal rate, regular rhythm and normal heart sounds.  Exam reveals no gallop and no friction rub.   No murmur heard. Pulmonary/Chest: Effort normal and breath sounds normal. No respiratory distress. She has no wheezes. She has no rales.  Abdominal: Soft. Bowel sounds are normal. She exhibits no distension. There is no tenderness. There is no rebound.  Easily reducible small right inguinal hernia.  Neurological: She is alert and oriented to person, place, and time.  Skin: Skin is warm  and dry.  Vitals reviewed.   Assessment  Symptomatic right inguinal hernia Plan   Patient will call to schedule surgery. She will undergo a right internal herniorrhaphy with mesh. The risks and benefits of the procedure including bleeding, infection, mesh use, and the possibility of recurrence of the hernia were fully explained to the patient, who gave informed consent.

## 2016-11-03 NOTE — Patient Instructions (Signed)

## 2016-11-23 ENCOUNTER — Other Ambulatory Visit (HOSPITAL_COMMUNITY): Payer: Self-pay

## 2016-11-28 ENCOUNTER — Ambulatory Visit: Payer: 59 | Admitting: Adult Health

## 2016-12-01 ENCOUNTER — Ambulatory Visit: Payer: 59 | Admitting: Advanced Practice Midwife

## 2016-12-14 ENCOUNTER — Inpatient Hospital Stay (HOSPITAL_COMMUNITY): Admission: RE | Admit: 2016-12-14 | Payer: Self-pay | Source: Ambulatory Visit

## 2016-12-19 DIAGNOSIS — J309 Allergic rhinitis, unspecified: Secondary | ICD-10-CM | POA: Diagnosis not present

## 2016-12-19 DIAGNOSIS — H6092 Unspecified otitis externa, left ear: Secondary | ICD-10-CM | POA: Diagnosis not present

## 2016-12-19 DIAGNOSIS — M419 Scoliosis, unspecified: Secondary | ICD-10-CM | POA: Diagnosis not present

## 2016-12-22 ENCOUNTER — Encounter: Payer: Self-pay | Admitting: *Deleted

## 2016-12-22 ENCOUNTER — Ambulatory Visit: Payer: 59 | Admitting: Advanced Practice Midwife

## 2016-12-30 NOTE — H&P (Signed)
Deanna Hughes; [id number]; 04-03-1990   HPI Patient is a 26 year old black female who is referred to my care by [facility], [place] for evaluation and treatment of a right internal hernia. Is been present for 3 years, but recently it is starting to protrude more and causing her discomfort. She has a pain a 5 out of 10. She states she is easily able to reduce it when she lies down. No nausea or vomiting have been noted. Past Medical History:  Diagnosis Date  . BV (bacterial vaginosis) 04/02/2015  . Chlamydia    treated 6/6  . Chronic back pain   . Chronic headaches   . Contraceptive management 01/25/2013  . Depression   . Encounter for Nexplanon removal 02/27/2015  . Irregular bleeding 06/03/2013  . Miscarriage 03/27/2013  . Nexplanon insertion 04/11/2013   nexplanon inserted 04/11/13 remove 3/32/18  . Right sided abdominal pain   . Scoliosis   . Seasonal allergies 07/03/2012  . Sinus infection 08/27/2013  . Supervision of normal pregnancy 08/27/2015    [facility] at   7+5 weeks  FOB  Dating By  LMP  Pap  GC/CT Initial:                36+wks: Genetic Screen NT/IT:  CF screen  Anatomic [place]  Flu vaccine  Tdap Recommended ~ 28wks Glucose Screen  2 hr GBS  Feed Preference  Contraception  Circumcision  Childbirth Classes  Pediatrician    . Threatened abortion in early pregnancy 03/20/2013  . Vaginal discharge 10/03/2012  . Yeast infection 07/03/2012    Past Surgical History:  Procedure Laterality Date  . NO PAST SURGERIES      Family History  Problem Relation Age of Onset  . Asthma Mother   . Asthma Sister   . Asthma Brother   . Asthma Daughter   . Diabetes Maternal Grandmother   . Hypertension Maternal Grandmother   . Asthma Maternal Grandmother   . Heart disease Maternal Grandmother   . Stroke Maternal Grandmother   . Hypertension Maternal Grandfather   . Asthma Maternal Grandfather   . Heart disease Maternal Grandfather   . Asthma Son      Current Outpatient Prescriptions on File Prior to Visit  Medication Sig Dispense Refill  . acetaminophen (TYLENOL) 325 MG tablet Take 650 mg by mouth every 6 (six) hours as needed for mild pain, moderate pain, fever or headache.     . fluconazole (DIFLUCAN) 150 MG tablet 1 po stat; repeat in 3 days 2 tablet 2  . fluticasone (FLONASE) 50 MCG/ACT nasal spray Place 2 sprays into both nostrils daily.     Marland Kitchen loratadine (CLARITIN) 10 MG tablet TK 1 T PO D  6  . Prenat-FeCbn-FeAsp-Meth-FA-DHA (PRENATE MINI) 18-0.6-0.4-350 MG CAPS Take 1 capsule by mouth daily. (Patient not taking: Reported on 06/30/2016) 30 capsule 11   No current facility-administered medications on file prior to visit.     No Known Allergies  History  Alcohol Use No    History  Smoking Status  . Never Smoker  Smokeless Tobacco  . Never Used    Review of Systems  Constitutional: Negative.   HENT: Negative.   Eyes: Negative.   Respiratory: Negative.   Cardiovascular: Negative.   Gastrointestinal: Negative.   Genitourinary: Negative.   Musculoskeletal: Negative.   Skin: Negative.   Neurological: Negative.   Endo/Heme/Allergies: Negative.   Psychiatric/Behavioral: Negative.     Objective   There were no vitals  filed for this visit.  Physical Exam  Constitutional: She is oriented to person, place, and time and well-developed, well-nourished, and in no distress.  HENT:  Head: Normocephalic and atraumatic.  Cardiovascular: Normal rate, regular rhythm and normal heart sounds.  Exam reveals no gallop and no friction rub.   No murmur heard. Pulmonary/Chest: Effort normal and breath sounds normal. No respiratory distress. She has no wheezes. She has no rales.  Abdominal: Soft. Bowel sounds are normal. She exhibits no distension. There is no tenderness. There is no rebound.  Easily reducible small right inguinal hernia.  Neurological: She is alert and oriented to person, place, and time.  Skin: Skin is warm  and dry.  Vitals reviewed.   Assessment  Symptomatic right inguinal hernia Plan   Patient will call to schedule surgery. She will undergo a right internal herniorrhaphy with mesh. The risks and benefits of the procedure including bleeding, infection, mesh use, and the possibility of recurrence of the hernia were fully explained to the patient, who gave informed consent.

## 2017-01-16 NOTE — Patient Instructions (Signed)
Deanna Hughes  01/16/2017     @PREFPERIOPPHARMACY @   Your procedure is scheduled on  01/27/2017   Report to [facility] at  615   A.M.  Call this number if you have problems the morning of surgery:  [phone]   Remember:  Do not eat food or drink liquids after midnight.  Take these medicines the morning of surgery with A SIP OF WATER  None   Do not wear jewelry, make-up or nail polish.  Do not wear lotions, powders, or perfumes, or deodorant.  Do not shave 48 hours prior to surgery.  Men may shave face and neck.  Do not bring valuables to the hospital.  [facility] is not responsible for any belongings or valuables.  Contacts, dentures or bridgework may not be worn into surgery.  Leave your suitcase in the car.  After surgery it may be brought to your room.  For patients admitted to the hospital, discharge time will be determined by your treatment team.  Patients discharged the day of surgery will not be allowed to drive home.   Name and phone number of your driver:   family Special instructions:  None  Please read over the following fact sheets that you were given. Anesthesia Post-op Instructions and Care and Recovery After Surgery       Open Hernia Repair, Adult Open hernia repair is a surgical procedure to fix a hernia. A hernia occurs when an internal organ or tissue pushes out through a weak spot in the abdominal wall muscles. Hernias commonly occur in the groin and around the navel. Most hernias tend to get worse over time. Often, surgery is done to prevent the hernia from becoming bigger, uncomfortable, or an emergency. Emergency surgery may be needed if abdominal contents get stuck in the opening (incarcerated hernia) or the blood supply gets cut off (strangulated hernia). In an open repair, an incision is made in the abdomen to perform the surgery. Tell a health care provider about:  Any allergies you have.  All medicines you are taking,  including vitamins, herbs, eye drops, creams, and over-the-counter medicines.  Any problems you or family members have had with anesthetic medicines.  Any blood or bone disorders you have.  Any surgeries you have had.  Any medical conditions you have, including any recent cold or flu symptoms.  Whether you are pregnant or may be pregnant. What are the risks? Generally, this is a safe procedure. However, problems may occur, including:  Long-lasting (chronic) pain.  Bleeding.  Infection.  Damage to the testicle. This can cause shrinking or swelling.  Damage to the bladder, blood vessels, intestine, or nerves near the hernia.  Trouble passing urine.  Allergic reactions to medicines.  Return of the hernia.  What happens before the procedure? Staying hydrated Follow instructions from your health care provider about hydration, which may include:  Up to 2 hours before the procedure - you may continue to drink clear liquids, such as water, clear fruit juice, black coffee, and plain tea.  Eating and drinking restrictions Follow instructions from your health care provider about eating and drinking, which may include:  8 hours before the procedure - stop eating heavy meals or foods such as meat, fried foods, or fatty foods.  6 hours before the procedure - stop eating light meals or foods, such as toast or cereal.  6 hours before the procedure - stop drinking milk or drinks that  contain milk.  2 hours before the procedure - stop drinking clear liquids.  Medicines  Ask your health care provider about: ? Changing or stopping your regular medicines. This is especially important if you are taking diabetes medicines or blood thinners. ? Taking medicines such as aspirin and ibuprofen. These medicines can thin your blood. Do not take these medicines before your procedure if your health care provider instructs you not to.  You may be given antibiotic medicine to help prevent  infection. General instructions  You may have blood tests or imaging studies.  Ask your health care provider how your surgical site will be marked or identified.  If you smoke, do not smoke for at least 2 weeks before your procedure or for as long as told by your health care provider.  Let your health care provider know if you develop a cold or any infection before your surgery.  Plan to have someone take you home from the hospital or clinic.  If you will be going home right after the procedure, plan to have someone with you for 24 hours. What happens during the procedure?  To reduce your risk of infection: ? Your health care team will wash or sanitize their hands. ? Your skin will be washed with soap. ? Hair may be removed from the surgical area.  An IV tube will be inserted into one of your veins.  You will be given one or more of the following: ? A medicine to help you relax (sedative). ? A medicine to numb the area (local anesthetic). ? A medicine to make you fall asleep (general anesthetic).  Your surgeon will make an incision over the hernia.  The tissues of the hernia will be moved back into place.  The edges of the hernia may be stitched together.  The opening in the abdominal muscles will be closed with stitches (sutures). Or, your surgeon will place a mesh patch made of manmade (synthetic) material over the opening.  The incision will be closed.  A bandage (dressing) may be placed over the incision. The procedure may vary among health care providers and hospitals. What happens after the procedure?  Your blood pressure, heart rate, breathing rate, and blood oxygen level will be monitored until the medicines you were given have worn off.  You may be given medicine for pain.  Do not drive for 24 hours if you received a sedative. This information is not intended to replace advice given to you by your health care provider. Make sure you discuss any questions you  have with your health care provider. Document Released: 06/29/2000 Document Revised: 07/24/2015 Document Reviewed: 06/17/2015 Elsevier Interactive Patient Education  2018 [place], Adult, Care After These instructions give you information about caring for yourself after your procedure. Your doctor may also give you more specific instructions. If you have problems or questions, contact your doctor. Follow these instructions at home: Surgical cut (incision) care   Follow instructions from your doctor about how to take care of your surgical cut area. Make sure you: ? Wash your hands with soap and water before you change your bandage (dressing). If you cannot use soap and water, use hand sanitizer. ? Change your bandage as told by your doctor. ? Leave stitches (sutures), skin glue, or skin tape (adhesive) strips in place. They may need to stay in place for 2 weeks or longer. If tape strips get loose and curl up, you may trim the loose edges. Do  not remove tape strips completely unless your doctor says it is okay.  Check your surgical cut every day for signs of infection. Check for: ? More redness, swelling, or pain. ? More fluid or blood. ? Warmth. ? Pus or a bad smell. Activity  Do not drive or use heavy machinery while taking prescription pain medicine. Do not drive until your doctor says it is okay.  Until your doctor says it is okay: ? Do not lift anything that is heavier than 10 lb (4.5 kg). ? Do not play contact sports.  Return to your normal activities as told by your doctor. Ask your doctor what activities are safe. General instructions  To prevent or treat having a hard time pooping (constipation) while you are taking prescription pain medicine, your doctor may recommend that you: ? Drink enough fluid to keep your pee (urine) clear or pale yellow. ? Take over-the-counter or prescription medicines. ? Eat foods that are high in fiber, such as fresh  fruits and vegetables, whole grains, and beans. ? Limit foods that are high in fat and processed sugars, such as fried and sweet foods.  Take over-the-counter and prescription medicines only as told by your doctor.  Do not take baths, swim, or use a hot tub until your doctor says it is okay.  Keep all follow-up visits as told by your doctor. This is important. Contact a doctor if:  You develop a rash.  You have more redness, swelling, or pain around your surgical cut.  You have more fluid or blood coming from your surgical cut.  Your surgical cut feels warm to the touch.  You have pus or a bad smell coming from your surgical cut.  You have a fever or chills.  You have blood in your poop (stool).  You have not pooped in 2-3 days.  Medicine does not help your pain. Get help right away if:  You have chest pain or you are short of breath.  You feel light-headed.  You feel weak and dizzy (feel faint).  You have very bad pain.  You throw up (vomit) and your pain is worse. This information is not intended to replace advice given to you by your health care provider. Make sure you discuss any questions you have with your health care provider. Document Released: 01/24/2014 Document Revised: 07/24/2015 Document Reviewed: 06/17/2015 Elsevier Interactive Patient Education  2018 [place] Anesthesia, Adult General anesthesia is the use of medicines to make a person "go to sleep" (be unconscious) for a medical procedure. General anesthesia is often recommended when a procedure:  Is long.  Requires you to be still or in an unusual position.  Is major and can cause you to lose blood.  Is impossible to do without general anesthesia.  The medicines used for general anesthesia are called general anesthetics. In addition to making you sleep, the medicines:  Prevent pain.  Control your blood pressure.  Relax your muscles.  Tell a health care provider about:  Any  allergies you have.  All medicines you are taking, including vitamins, herbs, eye drops, creams, and over-the-counter medicines.  Any problems you or family members have had with anesthetic medicines.  Types of anesthetics you have had in the past.  Any bleeding disorders you have.  Any surgeries you have had.  Any medical conditions you have.  Any history of heart or lung conditions, such as heart failure, sleep apnea, or chronic obstructive pulmonary disease (COPD).  Whether you are pregnant  or may be pregnant.  Whether you use tobacco, alcohol, marijuana, or street drugs.  Any history of Armed forces logistics/support/administrative officer.  Any history of depression or anxiety. What are the risks? Generally, this is a safe procedure. However, problems may occur, including:  Allergic reaction to anesthetics.  Lung and heart problems.  Inhaling food or liquids from your stomach into your lungs (aspiration).  Injury to nerves.  Waking up during your procedure and being unable to move (rare).  Extreme agitation or a state of mental confusion (delirium) when you wake up from the anesthetic.  Air in the bloodstream, which can lead to stroke.  These problems are more likely to develop if you are having a major surgery or if you have an advanced medical condition. You can prevent some of these complications by answering all of your health care provider's questions thoroughly and by following all pre-procedure instructions. General anesthesia can cause side effects, including:  Nausea or vomiting  A sore throat from the breathing tube.  Feeling cold or shivery.  Feeling tired, washed out, or achy.  Sleepiness or drowsiness.  Confusion or agitation.  What happens before the procedure? Staying hydrated Follow instructions from your health care provider about hydration, which may include:  Up to 2 hours before the procedure - you may continue to drink clear liquids, such as water, clear fruit juice,  black coffee, and plain tea.  Eating and drinking restrictions Follow instructions from your health care provider about eating and drinking, which may include:  8 hours before the procedure - stop eating heavy meals or foods such as meat, fried foods, or fatty foods.  6 hours before the procedure - stop eating light meals or foods, such as toast or cereal.  6 hours before the procedure - stop drinking milk or drinks that contain milk.  2 hours before the procedure - stop drinking clear liquids.  Medicines  Ask your health care provider about: ? Changing or stopping your regular medicines. This is especially important if you are taking diabetes medicines or blood thinners. ? Taking medicines such as aspirin and ibuprofen. These medicines can thin your blood. Do not take these medicines before your procedure if your health care provider instructs you not to. ? Taking new dietary supplements or medicines. Do not take these during the week before your procedure unless your health care provider approves them.  If you are told to take a medicine or to continue taking a medicine on the day of the procedure, take the medicine with sips of water. General instructions   Ask if you will be going home the same day, the following day, or after a longer hospital stay. ? Plan to have someone take you home. ? Plan to have someone stay with you for the first 24 hours after you leave the hospital or clinic.  For 3-6 weeks before the procedure, try not to use any tobacco products, such as cigarettes, chewing tobacco, and e-cigarettes.  You may brush your teeth on the morning of the procedure, but make sure to spit out the toothpaste. What happens during the procedure?  You will be given anesthetics through a mask and through an IV tube in one of your veins.  You may receive medicine to help you relax (sedative).  As soon as you are asleep, a breathing tube may be used to help you breathe.  An  anesthesia specialist will stay with you throughout the procedure. He or she will help keep you comfortable and  safe by continuing to give you medicines and adjusting the amount of medicine that you get. He or she will also watch your blood pressure, pulse, and oxygen levels to make sure that the anesthetics do not cause any problems.  If a breathing tube was used to help you breathe, it will be removed before you wake up. The procedure may vary among health care providers and hospitals. What happens after the procedure?  You will wake up, often slowly, after the procedure is complete, usually in a recovery area.  Your blood pressure, heart rate, breathing rate, and blood oxygen level will be monitored until the medicines you were given have worn off.  You may be given medicine to help you calm down if you feel anxious or agitated.  If you will be going home the same day, your health care provider may check to make sure you can stand, drink, and urinate.  Your health care providers will treat your pain and side effects before you go home.  Do not drive for 24 hours if you received a sedative.  You may: ? Feel nauseous and vomit. ? Have a sore throat. ? Have mental slowness. ? Feel cold or shivery. ? Feel sleepy. ? Feel tired. ? Feel sore or achy, even in parts of your body where you did not have surgery. This information is not intended to replace advice given to you by your health care provider. Make sure you discuss any questions you have with your health care provider. Document Released: 04/12/2007 Document Revised: 06/16/2015 Document Reviewed: 12/18/2014 Elsevier Interactive Patient Education  2018 [place] Anesthesia, Adult, Care After These instructions provide you with information about caring for yourself after your procedure. Your health care provider may also give you more specific instructions. Your treatment has been planned according to current medical  practices, but problems sometimes occur. Call your health care provider if you have any problems or questions after your procedure. What can I expect after the procedure? After the procedure, it is common to have:  Vomiting.  A sore throat.  Mental slowness.  It is common to feel:  Nauseous.  Cold or shivery.  Sleepy.  Tired.  Sore or achy, even in parts of your body where you did not have surgery.  Follow these instructions at home: For at least 24 hours after the procedure:  Do not: ? Participate in activities where you could fall or become injured. ? Drive. ? Use heavy machinery. ? Drink alcohol. ? Take sleeping pills or medicines that cause drowsiness. ? Make important decisions or sign legal documents. ? Take care of children on your own.  Rest. Eating and drinking  If you vomit, drink water, juice, or soup when you can drink without vomiting.  Drink enough fluid to keep your urine clear or pale yellow.  Make sure you have little or no nausea before eating solid foods.  Follow the diet recommended by your health care provider. General instructions  Have a responsible adult stay with you until you are awake and alert.  Return to your normal activities as told by your health care provider. Ask your health care provider what activities are safe for you.  Take over-the-counter and prescription medicines only as told by your health care provider.  If you smoke, do not smoke without supervision.  Keep all follow-up visits as told by your health care provider. This is important. Contact a health care provider if:  You continue to have nausea or vomiting  at home, and medicines are not helpful.  You cannot drink fluids or start eating again.  You cannot urinate after 8-12 hours.  You develop a skin rash.  You have fever.  You have increasing redness at the site of your procedure. Get help right away if:  You have difficulty breathing.  You have  chest pain.  You have unexpected bleeding.  You feel that you are having a life-threatening or urgent problem. This information is not intended to replace advice given to you by your health care provider. Make sure you discuss any questions you have with your health care provider. Document Released: 04/11/2000 Document Revised: 06/08/2015 Document Reviewed: 12/18/2014 Elsevier Interactive Patient Education  [employer].

## 2017-01-20 ENCOUNTER — Other Ambulatory Visit: Payer: Self-pay

## 2017-01-20 ENCOUNTER — Encounter (HOSPITAL_COMMUNITY)
Admission: RE | Admit: 2017-01-20 | Discharge: 2017-01-20 | Disposition: A | Discharge status: Home / Self Care | Payer: 59 | Source: Ambulatory Visit | Attending: General Surgery | Admitting: General Surgery

## 2017-01-20 ENCOUNTER — Encounter (HOSPITAL_COMMUNITY): Payer: Self-pay

## 2017-01-20 DIAGNOSIS — J302 Other seasonal allergic rhinitis: Secondary | ICD-10-CM | POA: Insufficient documentation

## 2017-01-20 DIAGNOSIS — Z01812 Encounter for preprocedural laboratory examination: Secondary | ICD-10-CM | POA: Diagnosis not present

## 2017-01-20 DIAGNOSIS — R109 Unspecified abdominal pain: Secondary | ICD-10-CM | POA: Insufficient documentation

## 2017-01-20 DIAGNOSIS — B9689 Other specified bacterial agents as the cause of diseases classified elsewhere: Secondary | ICD-10-CM | POA: Insufficient documentation

## 2017-01-20 DIAGNOSIS — N760 Acute vaginitis: Secondary | ICD-10-CM | POA: Diagnosis not present

## 2017-01-20 DIAGNOSIS — R152 Fecal urgency: Secondary | ICD-10-CM | POA: Insufficient documentation

## 2017-01-20 DIAGNOSIS — N926 Irregular menstruation, unspecified: Secondary | ICD-10-CM | POA: Diagnosis not present

## 2017-01-20 LAB — CBC WITH DIFFERENTIAL/PLATELET
BASOS ABS: 0 10*3/uL (ref 0.0–0.1)
Basophils Relative: 0 %
EOS ABS: 0.1 10*3/uL (ref 0.0–0.7)
Eosinophils Relative: 1 %
HEMATOCRIT: 36.6 % (ref 36.0–46.0)
Hemoglobin: 12 g/dL (ref 12.0–15.0)
Lymphocytes Relative: 16 %
Lymphs Abs: 1.8 10*3/uL (ref 0.7–4.0)
MCH: 28.4 pg (ref 26.0–34.0)
MCHC: 32.8 g/dL (ref 30.0–36.0)
MCV: 86.7 fL (ref 78.0–100.0)
MONO ABS: 0.9 10*3/uL (ref 0.1–1.0)
MONOS PCT: 9 %
NEUTROS ABS: 8.2 10*3/uL — AB (ref 1.7–7.7)
NEUTROS PCT: 74 %
Platelets: 215 10*3/uL (ref 150–400)
RBC: 4.22 MIL/uL (ref 3.87–5.11)
RDW: 12.5 % (ref 11.5–15.5)
WBC: 11 10*3/uL — ABNORMAL HIGH (ref 4.0–10.5)

## 2017-01-20 LAB — BASIC METABOLIC PANEL
ANION GAP: 9 (ref 5–15)
BUN: 9 mg/dL (ref 6–20)
CO2: 24 mmol/L (ref 22–32)
CREATININE: 0.69 mg/dL (ref 0.44–1.00)
Calcium: 8.9 mg/dL (ref 8.9–10.3)
Chloride: 102 mmol/L (ref 101–111)
GLUCOSE: 84 mg/dL (ref 65–99)
Potassium: 3.4 mmol/L — ABNORMAL LOW (ref 3.5–5.1)
Sodium: 135 mmol/L (ref 135–145)

## 2017-01-20 LAB — HCG, SERUM, QUALITATIVE: PREG SERUM: NEGATIVE

## 2017-01-26 ENCOUNTER — Encounter (HOSPITAL_COMMUNITY): Payer: Self-pay | Admitting: Anesthesiology

## 2017-01-27 ENCOUNTER — Encounter (HOSPITAL_COMMUNITY): Admission: RE | Payer: Self-pay | Source: Ambulatory Visit

## 2017-01-27 ENCOUNTER — Telehealth: Payer: Self-pay | Admitting: *Deleted

## 2017-01-27 ENCOUNTER — Ambulatory Visit (HOSPITAL_COMMUNITY): Admission: RE | Admit: 2017-01-27 | Payer: 59 | Source: Ambulatory Visit | Admitting: General Surgery

## 2017-01-27 DIAGNOSIS — K4090 Unilateral inguinal hernia, without obstruction or gangrene, not specified as recurrent: Secondary | ICD-10-CM

## 2017-01-27 SURGERY — REPAIR, HERNIA, INGUINAL, ADULT
Anesthesia: General | Laterality: Right

## 2017-01-27 NOTE — Telephone Encounter (Signed)
Patient states she went to Dr Arnoldo Morale for hernia consult and repair. Surgery was scheduled but he would not sign papers for her to be out of work until she had the surgery.  Patient stated that her boss would not give her the time off until they were signed.  Says she would like to go to another provider.  Will put in referral to [facility].

## 2017-02-01 ENCOUNTER — Ambulatory Visit: Payer: 59 | Admitting: General Surgery

## 2017-02-06 ENCOUNTER — Encounter: Payer: Self-pay | Admitting: *Deleted

## 2017-02-14 ENCOUNTER — Ambulatory Visit (INDEPENDENT_AMBULATORY_CARE_PROVIDER_SITE_OTHER): Payer: 59 | Admitting: General Surgery

## 2017-02-14 ENCOUNTER — Encounter: Payer: Self-pay | Admitting: General Surgery

## 2017-02-14 ENCOUNTER — Encounter: Payer: Self-pay | Admitting: Advanced Practice Midwife

## 2017-02-14 ENCOUNTER — Ambulatory Visit (INDEPENDENT_AMBULATORY_CARE_PROVIDER_SITE_OTHER): Payer: 59 | Admitting: Advanced Practice Midwife

## 2017-02-14 VITALS — BP 102/66 | HR 90 | Ht 62.0 in | Wt 175.0 lb

## 2017-02-14 VITALS — BP 102/68 | HR 88 | Resp 12 | Ht 62.0 in | Wt 173.0 lb

## 2017-02-14 DIAGNOSIS — N898 Other specified noninflammatory disorders of vagina: Secondary | ICD-10-CM

## 2017-02-14 DIAGNOSIS — K4090 Unilateral inguinal hernia, without obstruction or gangrene, not specified as recurrent: Secondary | ICD-10-CM | POA: Diagnosis not present

## 2017-02-14 NOTE — Patient Instructions (Addendum)
The patient is aware to call back for any questions or concerns.  She will try aspirin to see if she has a reaction.  Inguinal Hernia, Adult An inguinal hernia is when fat or the intestines push through the area where the leg meets the lower belly (groin) and make a rounded lump (bulge). This condition happens over time. There are three types of inguinal hernias. These types include:  Hernias that can be pushed back into the belly (are reducible).  Hernias that cannot be pushed back into the belly (are incarcerated).  Hernias that cannot be pushed back into the belly and lose their blood supply (get strangulated). This type needs emergency surgery.  Follow these instructions at home: Lifestyle  Drink enough fluid to keep your urine (pee) clear or pale yellow.  Eat plenty of fruits, vegetables, and whole grains. These have a lot of fiber. Talk with your doctor if you have questions.  Avoid lifting heavy objects.  Avoid standing for long periods of time.  Do not use tobacco products. These include cigarettes, chewing tobacco, or e-cigarettes. If you need help quitting, ask your doctor.  Try to stay at a healthy weight. General instructions  Do not try to force the hernia back in.  Watch your hernia for any changes in color or size. Let your doctor know if there are any changes.  Take over-the-counter and prescription medicines only as told by your doctor.  Keep all follow-up visits as told by your doctor. This is important. Contact a doctor if:  You have a fever.  You have new symptoms.  Your symptoms get worse. Get help right away if:  The area where the legs meets the lower belly has: ? Pain that gets worse suddenly. ? A bulge that gets bigger suddenly and does not go down. ? A bulge that turns red or purple. ? A bulge that is painful to the touch.  You are a man and your scrotum: ? Suddenly feels painful. ? Suddenly changes in size.  You feel sick to your  stomach (nauseous) and this feeling does not go away.  You throw up (vomit) and this keeps happening.  You feel your heart beating a lot more quickly than normal.  You cannot poop (have a bowel movement) or pass gas. This information is not intended to replace advice given to you by your health care provider. Make sure you discuss any questions you have with your health care provider. Document Released: 02/03/2006 Document Revised: 06/11/2015 Document Reviewed: 11/13/2013 Elsevier Interactive Patient Education  [employer].  The patient is scheduled for surgery at [facility] on 03/17/17. She will pre admit by phone. The patient is aware of date and instructions.

## 2017-02-14 NOTE — Progress Notes (Signed)
Patient ID: Deanna Hughes, female   DOB: [birth year], 27 y.o.   MRN: [id number]  Chief Complaint  Patient presents with  . Hernia    HPI Deanna Hughes is a 27 y.o. female.  Patient here today for an evaluation of a right groin hernia.  She states that she noticed a knot in right groin for about 1 year.  It does seem to be causing some groin pain.  No nausea, vomiting, constipation or diarrhea noted. She states it has gotten larger and want go down. She did see another MD in [place] but they would not complete her FMLA papers prior to her surgery.   She works at [employer] in [employer].  HPI  Past Medical History:  Diagnosis Date  . BV (bacterial vaginosis) 04/02/2015  . Chlamydia    treated 6/6  . Chronic back pain   . Chronic headaches   . Contraceptive management 01/25/2013  . Depression   . Encounter for Nexplanon removal 02/27/2015  . Irregular bleeding 06/03/2013  . Miscarriage 03/27/2013  . Nexplanon insertion 04/11/2013   nexplanon inserted 04/11/13 remove 3/32/18  . Right sided abdominal pain   . Scoliosis   . Seasonal allergies 07/03/2012  . Sinus infection 08/27/2013  . Supervision of normal pregnancy 08/27/2015    [facility] at   7+5 weeks  FOB  Dating By  LMP  Pap  GC/CT Initial:                36+wks: Genetic Screen NT/IT:  CF screen  Anatomic [place]  Flu vaccine  Tdap Recommended ~ 28wks Glucose Screen  2 hr GBS  Feed Preference  Contraception  Circumcision  Childbirth Classes  Pediatrician    . Threatened abortion in early pregnancy 03/20/2013  . Vaginal discharge 10/03/2012  . Yeast infection 07/03/2012    Past Surgical History:  Procedure Laterality Date  . NO PAST SURGERIES    . WISDOM TOOTH EXTRACTION      Family History  Problem Relation Age of Onset  . Asthma Mother   . Asthma Sister   . Asthma Brother   . Asthma Daughter   . Diabetes Maternal Grandmother   . Hypertension Maternal Grandmother   . Asthma Maternal Grandmother    . Heart disease Maternal Grandmother   . Stroke Maternal Grandmother   . Hypertension Maternal Grandfather   . Asthma Maternal Grandfather   . Heart disease Maternal Grandfather   . Asthma Son   . Colon cancer Neg Hx     Social History Social History   Tobacco Use  . Smoking status: Never Smoker  . Smokeless tobacco: Never Used  Substance Use Topics  . Alcohol use: No  . Drug use: No    Allergies  Allergen Reactions  . Advil [Ibuprofen] Other (See Comments)    Breakout, heart palpitations and trouble breathing. Cant take Advil but can take Ibuprofen    Current Outpatient Medications  Medication Sig Dispense Refill  . acetaminophen (TYLENOL) 500 MG tablet Take 1,000 mg every 6 (six) hours as needed by mouth for mild pain, moderate pain, fever or headache.     . etonogestrel (NEXPLANON) 68 MG IMPL implant 1 each once by Subdermal route.    . fluticasone (FLONASE) 50 MCG/ACT nasal spray Place 2 sprays into both nostrils daily as needed for allergies.   2  . ibuprofen (ADVIL,MOTRIN) 200 MG tablet Take 200 mg every 6 (six) hours as needed by mouth for  headache or moderate pain.    . Prenat-FeCbn-FeAsp-Meth-FA-DHA (PRENATE MINI) 18-0.6-0.4-350 MG CAPS Take 1 capsule by mouth daily. 30 capsule 11   No current facility-administered medications for this visit.     Review of Systems Review of Systems  Constitutional: Negative.   Respiratory: Negative.   Cardiovascular: Negative.   Gastrointestinal: Positive for abdominal pain. Negative for constipation and diarrhea.    Blood pressure 102/68, pulse 88, resp. rate 12, height 5\' 2"  (1.575 m), weight 173 lb (78.5 kg), last menstrual period 02/06/2017, not currently breastfeeding.  Physical Exam Physical Exam  Constitutional: She is oriented to person, place, and time. She appears well-developed and well-nourished.  HENT:  Mouth/Throat: Oropharynx is clear and moist.  Eyes: Conjunctivae are normal. No scleral icterus.  Neck:  Neck supple.  Cardiovascular: Normal rate, regular rhythm and normal heart sounds.  Pulses:      Femoral pulses are 2+ on the right side, and 2+ on the left side. Pulmonary/Chest: Effort normal and breath sounds normal.  Abdominal: Soft. Normal appearance. There is no tenderness. A hernia is present. Hernia confirmed positive in the right inguinal area. Hernia confirmed negative in the left inguinal area.    Lymphadenopathy:    She has no cervical adenopathy.  Neurological: She is alert and oriented to person, place, and time.  Skin: Skin is warm and dry.  Psychiatric: Her behavior is normal.    Data Reviewed Surgical evaluation of November 03, 2016 reviewed.  Assessment    Symptomatically right inguinal hernia.    Plan    Patient reported that Advil caused based bar palpitations without rash or dyspnea.  Anti-inflammatories would be helpful after surgery. She will try a single adult aspirin to see if she has a reaction similar to that she experienced with Advil.  If not, she may be able to tolerate Aleve.  Possible role for mesh placement was discussed.  She has 3 children and at this time is not planning in the near future to have others, but she may decide later to have additional children.  Backspace single layer prosthetic mesh may be better than a bilayer mesh.  Hernia precautions and incarceration were discussed with the patient. If they develop symptoms of an incarcerated hernia, they were encouraged to seek prompt medical attention.  I have recommended repair of the hernia using mesh on an outpatient basis in the near future. The risk of infection was reviewed. The role of prosthetic mesh to minimize the risk of recurrence was reviewed.  Plan on being out of work 4 weeks. May return to work sooner if desires.    HPI, Physical Exam, Assessment and Plan have been scribed under the direction and in the presence of Robert Bellow, MD. Karie Fetch, RN  I have completed  the exam and reviewed the above documentation for accuracy and completeness.  I agree with the above.  Haematologist has been used and any errors in dictation or transcription are unintentional.  Hervey Ard, M.D., F.A.C.S. The patient is scheduled for surgery at [facility] on 03/17/17. She will pre admit by phone. The patient is aware of date and instructions.  Documented by Caryl-Lyn Otis Brace LPN  Forest Gleason Krystena Reitter 02/15/2017, 11:01 AM

## 2017-02-14 NOTE — Progress Notes (Signed)
cCHIEF COMPLAINT/HPI:  27 y.o. female complains of  vaginal  Odor off and on "for a while"  Says it smells fishy at times.  No irritaiton/itch. . No UTI symptoms.   Denies history of known exposure to STD or symptoms in partner.  Patient's last menstrual period was 02/06/2017 (exact date).  Has not tried an OTC product.   Review of Systems  Constitutional: Negative for fever and chills Eyes: Negative for visual disturbances Respiratory: Negative for shortness of breath, dyspnea Cardiovascular: Negative for chest pain or palpitations  Gastrointestinal: Negative for vomiting, diarrhea and constipation Genitourinary: Negative for dysuria and urgency Musculoskeletal: Negative for back pain, joint pain, myalgias  Neurological: Negative for dizziness and headaches    Past Medical History: Past Medical History:  Diagnosis Date  . BV (bacterial vaginosis) 04/02/2015  . Chlamydia    treated 6/6  . Chronic back pain   . Chronic headaches   . Contraceptive management 01/25/2013  . Depression   . Encounter for Nexplanon removal 02/27/2015  . Irregular bleeding 06/03/2013  . Miscarriage 03/27/2013  . Nexplanon insertion 04/11/2013   nexplanon inserted 04/11/13 remove 3/32/18  . Right sided abdominal pain   . Scoliosis   . Seasonal allergies 07/03/2012  . Sinus infection 08/27/2013  . Supervision of normal pregnancy 08/27/2015    [facility] at   7+5 weeks  FOB  Dating By  LMP  Pap  GC/CT Initial:                36+wks: Genetic Screen NT/IT:  CF screen  Anatomic [place]  Flu vaccine  Tdap Recommended ~ 28wks Glucose Screen  2 hr GBS  Feed Preference  Contraception  Circumcision  Childbirth Classes  Pediatrician    . Threatened abortion in early pregnancy 03/20/2013  . Vaginal discharge 10/03/2012  . Yeast infection 07/03/2012    Past Surgical History: Past Surgical History:  Procedure Laterality Date  . NO PAST SURGERIES    . WISDOM TOOTH EXTRACTION      Obstetrical History: OB  History    Gravida Para Term Preterm AB Living   5 3 3  0 2 3   SAB TAB Ectopic Multiple Live Births   2 0 0 0 3      Social History: Social History   Socioeconomic History  . Marital status: Single    Spouse name: None  . Number of children: None  . Years of education: None  . Highest education level: None  Social Needs  . Financial resource strain: None  . Food insecurity - worry: None  . Food insecurity - inability: None  . Transportation needs - medical: None  . Transportation needs - non-medical: None  Occupational History  . None  Tobacco Use  . Smoking status: Never Smoker  . Smokeless tobacco: Never Used  Substance and Sexual Activity  . Alcohol use: No  . Drug use: No  . Sexual activity: Not Currently    Birth control/protection: Implant    Comment: not in 4 weeks  Other Topics Concern  . None  Social History Narrative  . None    Family History: Family History  Problem Relation Age of Onset  . Asthma Mother   . Asthma Sister   . Asthma Brother   . Asthma Daughter   . Diabetes Maternal Grandmother   . Hypertension Maternal Grandmother   . Asthma Maternal Grandmother   . Heart disease Maternal Grandmother   . Stroke Maternal Grandmother   .  Hypertension Maternal Grandfather   . Asthma Maternal Grandfather   . Heart disease Maternal Grandfather   . Asthma Son     Allergies: Allergies  Allergen Reactions  . Advil [Ibuprofen] Other (See Comments)    Breakout, heart palpitations and trouble breathing. Cant take Advil but can take Ibuprofen        PHYSICAL EXAM: Physical Examination: General appearance - well appearing, and in no distress Mental status - alert, oriented to person, place, and time Chest:  Normal respiratory effort Heart - normal rate and regular rhythm Abdomen:  Soft, nontender Pelvic: SSE:  Normal appearing discharge, wet prep neg trich, neg clue, neg wbc, neg yeast; no odor   Musculoskeletal:  Normal range of motion  without pain Extremities:  No edema    Labs: No results found for this or any previous visit (from the past 24 hour(s)).   Assessment: Intermittent vaginal odor w/o sx of infection  Plan:  No orders of the defined types were placed in this encounter.  nuswab collected May try ph balancer/probiotic prn  CRESENZO-DISHMAN,Alcie Runions 02/14/17 2:55 PM

## 2017-02-14 NOTE — Patient Instructions (Signed)
Vaginal probiotic/pH balancer  Such as Luvena or RepHresh   Can use 2-3 times a week instead of every day  If there is still a problem,      UP4 ADULT Superior, compatible and safe strains DDS  -1 L.acidophilus (super strain) with B. Longum, B.Bifidum & B.Infantis Acid-resistant-survives stomach acid and Bile salts   Fortified with prebiotic Fructooligosaccharide to enhance growth and performance Potency: 15 Billion CFU/capsule Dosage: 1 capsule daily, best before a meal.  Amazon:  $21.90 for 60 caps   iF THERE IS STILL A PROBLEM ADD,   FLORAJEN ACIDOPHILUS High Potency Acidophilus Florajen high potency acidophilus is especially effective for restoring and maintaining a healthy, comfortable balance of vaginal flora. It is also beneficial for overall intestinal health and maintaining the immune system. [place] cultures per capsule Available in 30 and 60 capsules   Amazon:  $19.99 for 60 caps

## 2017-02-17 DIAGNOSIS — J309 Allergic rhinitis, unspecified: Secondary | ICD-10-CM | POA: Diagnosis not present

## 2017-02-17 DIAGNOSIS — H6692 Otitis media, unspecified, left ear: Secondary | ICD-10-CM | POA: Diagnosis not present

## 2017-02-21 ENCOUNTER — Telehealth: Payer: Self-pay | Admitting: Obstetrics & Gynecology

## 2017-02-21 NOTE — Telephone Encounter (Signed)
INformed trich, Gc/Chl negative but the rest of test has not resulted. Informed Manus Gunning may send message regarding results when final. Verbalized understanding.

## 2017-02-22 LAB — NUSWAB VAGINITIS PLUS (VG+)
CANDIDA ALBICANS, NAA: NEGATIVE
CANDIDA GLABRATA, NAA: NEGATIVE
Chlamydia trachomatis, NAA: NEGATIVE
Megasphaera 1: HIGH Score — AB
Neisseria gonorrhoeae, NAA: NEGATIVE
Trich vag by NAA: NEGATIVE

## 2017-02-23 ENCOUNTER — Encounter: Payer: Self-pay | Admitting: Advanced Practice Midwife

## 2017-03-08 ENCOUNTER — Emergency Department (HOSPITAL_COMMUNITY)
Admission: EM | Admit: 2017-03-08 | Discharge: 2017-03-08 | Disposition: A | Discharge status: Home / Self Care | Payer: 59 | Attending: Emergency Medicine | Admitting: Emergency Medicine

## 2017-03-08 ENCOUNTER — Other Ambulatory Visit: Payer: Self-pay

## 2017-03-08 ENCOUNTER — Encounter (HOSPITAL_COMMUNITY): Payer: Self-pay

## 2017-03-08 DIAGNOSIS — K047 Periapical abscess without sinus: Secondary | ICD-10-CM | POA: Diagnosis not present

## 2017-03-08 DIAGNOSIS — Z79899 Other long term (current) drug therapy: Secondary | ICD-10-CM | POA: Diagnosis not present

## 2017-03-08 DIAGNOSIS — K0889 Other specified disorders of teeth and supporting structures: Secondary | ICD-10-CM | POA: Diagnosis present

## 2017-03-08 MED ORDER — TRAMADOL HCL 50 MG PO TABS: 50.0000 mg | ORAL_TABLET | Freq: Four times a day (QID) | ORAL | 0 refills | Status: DC | PRN

## 2017-03-08 MED ORDER — CLINDAMYCIN HCL 150 MG PO CAPS: 450.0000 mg | ORAL_CAPSULE | Freq: Three times a day (TID) | ORAL | 0 refills | Status: DC

## 2017-03-08 NOTE — ED Triage Notes (Signed)
Pt was sent here by Dr. Simone Curia for an evaluation of cellulitis of left upper tooth. Pt's left side of face is swelling. Pain started this morning. Pt has a prescription for antibiotics, but has not started it yet. Was sent here for IV antibiotics

## 2017-03-08 NOTE — Discharge Instructions (Signed)
Complete your entire course of antibiotics as prescribed.  You  may use the tramadol for pain relief but do not drive within 4 hours of taking as this will make you drowsy.  Avoid applying heat or ice to this abscess area which can worsen your symptoms.  You may use warm salt water swish and spit treatment or half peroxide and water swish and spit after meals to keep this area clean as discussed.  Call your dentist and/or the oral surgeon you have been referred to for continuing care of this dental problem.  Return here for a recheck if you have any worsening pain or swelling.

## 2017-03-08 NOTE — ED Provider Notes (Signed)
[facility] EMERGENCY DEPARTMENT Provider Note   CSN: [id number] Arrival date & time: 03/08/17  1114     History   Chief Complaint Chief Complaint  Patient presents with  . Oral Swelling    HPI Deanna Hughes is a 27 y.o. female presenting with dental pain and cheek swelling which she woke with today.  She went to her dentist at [place] who sent her here for further evaluation over concern for cellulitis.  She also, incidentally was referred by her to an oral surgeon in [place] for extraction of this tooth. Pt denies drainage from the site, fevers or chills.  She also mentions chronic left ear infection which has not responded to multiple medications, last treated with cipro last month.  She is scheduled to see an ENT doctor in [place] tomorrow.  The history is provided by the patient.    Past Medical History:  Diagnosis Date  . BV (bacterial vaginosis) 04/02/2015  . Chlamydia    treated 6/6  . Chronic back pain   . Chronic headaches   . Contraceptive management 01/25/2013  . Depression   . Encounter for Nexplanon removal 02/27/2015  . Irregular bleeding 06/03/2013  . Miscarriage 03/27/2013  . Nexplanon insertion 04/11/2013   nexplanon inserted 04/11/13 remove 3/32/18  . Right sided abdominal pain   . Scoliosis   . Seasonal allergies 07/03/2012  . Sinus infection 08/27/2013  . Supervision of normal pregnancy 08/27/2015    [facility] at   7+5 weeks  FOB  Dating By  LMP  Pap  GC/CT Initial:                36+wks: Genetic Screen NT/IT:  CF screen  Anatomic [place]  Flu vaccine  Tdap Recommended ~ 28wks Glucose Screen  2 hr GBS  Feed Preference  Contraception  Circumcision  Childbirth Classes  Pediatrician    . Threatened abortion in early pregnancy 03/20/2013  . Vaginal discharge 10/03/2012  . Yeast infection 07/03/2012    Patient Active Problem List   Diagnosis Date Noted  . Right inguinal hernia 12/14/2015  . BV (bacterial vaginosis) 04/02/2015  . Sinus  infection 08/27/2013  . Irregular bleeding 06/03/2013  . Seasonal allergies 07/03/2012  . Defecation urgency     Past Surgical History:  Procedure Laterality Date  . NO PAST SURGERIES    . WISDOM TOOTH EXTRACTION      OB History    Gravida Para Term Preterm AB Living   5 3 3  0 2 3   SAB TAB Ectopic Multiple Live Births   2 0 0 0 3      Obstetric Comments   1st Menstrual Cycle:  15  1st Pregnancy:  18        Home Medications    Prior to Admission medications   Medication Sig Start Date End Date Taking? Authorizing Provider  acetaminophen (TYLENOL) 500 MG tablet Take 1,000 mg by mouth 3 (three) times daily as needed for moderate pain or headache.     [provider]  ciprofloxacin (CIPRO) 500 MG tablet Take 500 mg by mouth 2 (two) times daily. 02/17/17   [provider]  clindamycin (CLEOCIN) 150 MG capsule Take 3 capsules (450 mg total) by mouth 3 (three) times daily. 03/08/17   Evalee Jefferson, PA-C  etonogestrel (NEXPLANON) 68 MG IMPL implant 1 each once by Subdermal route.    [provider]  fluticasone (FLONASE) 50 MCG/ACT nasal spray Place 2 sprays  into both nostrils daily.  12/19/16   [provider]  ibuprofen (ADVIL,MOTRIN) 200 MG tablet Take 400 mg by mouth 3 (three) times daily as needed for headache or moderate pain.     [provider]  loratadine (CLARITIN) 10 MG tablet Take 10 mg by mouth daily.    [provider]  traMADol (ULTRAM) 50 MG tablet Take 1 tablet (50 mg total) by mouth every 6 (six) hours as needed. 03/08/17   Evalee Jefferson, PA-C    Family History Family History  Problem Relation Age of Onset  . Asthma Mother   . Asthma Sister   . Asthma Brother   . Asthma Daughter   . Diabetes Maternal Grandmother   . Hypertension Maternal Grandmother   . Asthma Maternal Grandmother   . Heart disease Maternal Grandmother   . Stroke Maternal Grandmother   . Hypertension Maternal Grandfather   . Asthma Maternal  Grandfather   . Heart disease Maternal Grandfather   . Asthma Son   . Colon cancer Neg Hx     Social History Social History   Tobacco Use  . Smoking status: Never Smoker  . Smokeless tobacco: Never Used  Substance Use Topics  . Alcohol use: No  . Drug use: No     Allergies   Advil [ibuprofen]   Review of Systems Review of Systems  Constitutional: Negative for chills and fever.  HENT: Positive for dental problem and ear pain. Negative for congestion, facial swelling, rhinorrhea, sinus pressure, sore throat, trouble swallowing and voice change.   Eyes: Negative for discharge.  Respiratory: Negative for cough, shortness of breath, wheezing and stridor.   Cardiovascular: Negative for chest pain.  Gastrointestinal: Negative for abdominal pain.  Genitourinary: Negative.   Musculoskeletal: Negative for neck pain and neck stiffness.     Physical Exam Updated Vital Signs BP 132/78 (BP Location: Right Arm)   Pulse 92   Temp 98.9 F (37.2 C) (Oral)   Resp 16   Ht 5\' 2"  (1.575 m)   Wt 78.5 kg (173 lb)   LMP 02/19/2017   SpO2 99%   BMI 31.64 kg/m   Physical Exam  Constitutional: She is oriented to person, place, and time. She appears well-developed and well-nourished. No distress.  HENT:  Head: Normocephalic and atraumatic.  Right Ear: Tympanic membrane and external ear normal.  Left Ear: External ear normal. There is tenderness.  Mouth/Throat: Oropharynx is clear and moist and mucous membranes are normal. No oral lesions. No trismus in the jaw. Dental caries present. No dental abscesses.  Left opacified TM, no erythema. No bulging. Deep cavity left upper 2nd molar tooth. Mild gingival edema and erythema. Subtle fullness to left cheek without erythema or induration.    Eyes: Conjunctivae and EOM are normal.  Neck: Normal range of motion. Neck supple.  Cardiovascular: Normal rate and normal heart sounds.  Pulmonary/Chest: Effort normal.  Musculoskeletal: Normal range of  motion.  Lymphadenopathy:    She has no cervical adenopathy.  Neurological: She is alert and oriented to person, place, and time.  Skin: Skin is warm and dry. No erythema.  Psychiatric: She has a normal mood and affect.     ED Treatments / Results  Labs (all labs ordered are listed, but only abnormal results are displayed) Labs Reviewed - No data to display  EKG  EKG Interpretation None       Radiology No results found.  Procedures Procedures (including critical care time)  Medications Ordered in ED Medications -  No data to display   Initial Impression / Assessment and Plan / ED Course  I have reviewed the triage vital signs and the nursing notes.  Pertinent labs & imaging results that were available during my care of the patient were reviewed by me and considered in my medical decision making (see chart for details).     Pt with dental infection and early abscess, no identifiable area amenable to I&D.  She was placed on Clindamycin. Advised to f/u with dentist, oral surgeon which her dentist arranged and her ENT doc tomorrow as her pcp scheduled.  Final Clinical Impressions(s) / ED Diagnoses   Final diagnoses:  Dental abscess    ED Discharge Orders        Ordered    clindamycin (CLEOCIN) 150 MG capsule  3 times daily     03/08/17 1319    traMADol (ULTRAM) 50 MG tablet  Every 6 hours PRN     03/08/17 1319       Evalee Jefferson, PA-C 03/08/17 1338    Isla Pence, MD 03/08/17 1421

## 2017-03-09 DIAGNOSIS — H9202 Otalgia, left ear: Secondary | ICD-10-CM | POA: Diagnosis not present

## 2017-03-09 DIAGNOSIS — K047 Periapical abscess without sinus: Secondary | ICD-10-CM | POA: Insufficient documentation

## 2017-03-10 ENCOUNTER — Encounter
Admission: RE | Admit: 2017-03-10 | Discharge: 2017-03-10 | Disposition: A | Discharge status: Home / Self Care | Payer: 59 | Source: Ambulatory Visit | Attending: General Surgery | Admitting: General Surgery

## 2017-03-10 ENCOUNTER — Other Ambulatory Visit: Payer: Self-pay

## 2017-03-10 NOTE — Patient Instructions (Signed)
Your procedure is scheduled on: 03-17-17 Report to Same Day Surgery 2nd floor medical mall [facility] Entrance-take elevator on left to 2nd floor.  Check in with surgery information desk.) To find out your arrival time please call [phone] between 1PM - 3PM on 03-16-17 Remember: Instructions that are not followed completely may result in serious medical risk, up to and including death, or upon the discretion of your surgeon and anesthesiologist your surgery may need to be rescheduled.    _x___ 1. Do not eat food after midnight the night before your procedure. NO GUM OR CANDY AFTER MIDNIGHT. You may drink clear liquids up to 2 hours before you are scheduled to arrive at the hospital for your procedure.  Do not drink clear liquids within 2 hours of your scheduled arrival to the hospital.  Clear liquids include  --Water or Apple juice without pulp  --Clear carbohydrate beverage such as ClearFast or Gatorade  --Black Coffee or Clear Tea (No milk, no creamers, do not add anything to the coffee or Tea      __x__ 2. No Alcohol for 24 hours before or after surgery.   __x__3. No Smoking or e-cigarettes for 24 prior to surgery.  Do not use any chewable tobacco products for at least 6 hour prior to surgery   ____  4. Bring all medications with you on the day of surgery if instructed.    __x__ 5. Notify your doctor if there is any change in your medical condition     (cold, fever, infections).    x___6. On the morning of surgery brush your teeth with toothpaste and water.  You may rinse your mouth with mouth wash if you wish.  Do not swallow any toothpaste or mouthwash.   Do not wear jewelry, make-up, hairpins, clips or nail polish.  Do not wear lotions, powders, or perfumes. You may wear deodorant.  Do not shave 48 hours prior to surgery. Men may shave face and neck.  Do not bring valuables to the hospital.    [facility] is not responsible for any belongings or valuables.    Contacts, dentures or bridgework may not be worn into surgery.  Leave your suitcase in the car. After surgery it may be brought to your room.  For patients admitted to the hospital, discharge time is determined by your treatment team.  _  Patients discharged the day of surgery will not be allowed to drive home.  You will need someone to drive you home and stay with you the night of your procedure.    Please read over the following fact sheets that you were given:   [facility] Preparing for Surgery and or MRSA Information   _x___ Take anti-hypertensive listed below, cardiac, seizure, asthma, anti-reflux and psychiatric medicines. These include:  1. YOU MAY TAKE TRAMADOL AM OF SURGERY IF NEEDED WITH A SMALL SIP OF WATER  2.  3.  4.  5.  6.  ____Fleets enema or Magnesium Citrate as directed.   ____ Use CHG Soap or sage wipes as directed on instruction sheet   ____ Use inhalers on the day of surgery and bring to hospital day of surgery  ____ Stop Metformin and Janumet 2 days prior to surgery.    ____ Take 1/2 of usual insulin dose the night before surgery and none on the morning surgery.   ____ Follow recommendations from Cardiologist, Pulmonologist or PCP regarding stopping Aspirin, Coumadin, Plavix ,Eliquis, Effient, or Pradaxa, and Pletal.  ____Stop Anti-inflammatories  such as Advil, Aleve, Ibuprofen, Motrin, Naproxen, Naprosyn, Goodies powders or aspirin products. OK to take Tylenol    ____ Stop supplements until after surgery.     ____ Bring C-Pap to the hospital.

## 2017-03-13 ENCOUNTER — Telehealth: Payer: Self-pay | Admitting: *Deleted

## 2017-03-13 NOTE — Telephone Encounter (Signed)
The procedure will need to be rescheduled when her current gum abscess has resolved.  She will need a preoperative visit in about 3 weeks with surgery to be scheduled 1 month from now.Marland Kitchen

## 2017-03-13 NOTE — Telephone Encounter (Signed)
Patient called the office today to report that she went to the dentist for an abscess on her gum last Thursday. She was placed on clindamycin 150 mg one pill three times a day for 21 days.   The patient is presently scheduled for right open inguinal hernia repair for 03-17-17 with Dr. Bary Castilla. Will check with Dr. Bary Castilla to see if we can proceed as scheduled or if surgery needs to be rescheduled at this time.

## 2017-03-13 NOTE — Telephone Encounter (Signed)
Patient notified.   Surgery has been rescheduled from 03-17-17 to 04-17-17.  Pre-op visit has been scheduled accordingly.

## 2017-03-15 ENCOUNTER — Telehealth: Payer: Self-pay | Admitting: *Deleted

## 2017-03-15 NOTE — Telephone Encounter (Signed)
Patient called regarding her FMLA papers. She stated that since her surgery date got changed to 04/17/17 she needs you to send them a letter stating that the date was changed.

## 2017-03-15 NOTE — Telephone Encounter (Signed)
RTW 05-15-17 faxed letter.

## 2017-03-30 ENCOUNTER — Ambulatory Visit (INDEPENDENT_AMBULATORY_CARE_PROVIDER_SITE_OTHER): Payer: 59 | Admitting: Otolaryngology

## 2017-03-30 DIAGNOSIS — H9202 Otalgia, left ear: Secondary | ICD-10-CM

## 2017-03-30 DIAGNOSIS — H93293 Other abnormal auditory perceptions, bilateral: Secondary | ICD-10-CM | POA: Diagnosis not present

## 2017-04-03 ENCOUNTER — Other Ambulatory Visit: Payer: 59 | Admitting: Adult Health

## 2017-04-06 ENCOUNTER — Encounter: Payer: Self-pay | Admitting: *Deleted

## 2017-04-06 ENCOUNTER — Ambulatory Visit: Payer: 59 | Admitting: General Surgery

## 2017-04-13 ENCOUNTER — Ambulatory Visit (INDEPENDENT_AMBULATORY_CARE_PROVIDER_SITE_OTHER): Payer: 59 | Admitting: General Surgery

## 2017-04-13 ENCOUNTER — Encounter: Payer: Self-pay | Admitting: General Surgery

## 2017-04-13 VITALS — BP 104/64 | HR 70 | Resp 16 | Ht 62.0 in | Wt 174.0 lb

## 2017-04-13 DIAGNOSIS — K4090 Unilateral inguinal hernia, without obstruction or gangrene, not specified as recurrent: Secondary | ICD-10-CM

## 2017-04-13 NOTE — Progress Notes (Signed)
Patient ID: Deanna Hughes, female   DOB: Sep 13, 1990, 27 y.o.   MRN: [id number]  Chief Complaint  Patient presents with  . Pre-op Exam    HPI Deanna Hughes is a 27 y.o. female here today for her pre op right inguinal hernia repair scheduled for 04/17/2017.   Patient states she can't take aspirin.   Patient states she doesn't wasn't any mesh used during her hernia repair.   HPI  Past Medical History:  Diagnosis Date  . BV (bacterial vaginosis) 04/02/2015  . Chlamydia    treated 6/6  . Chronic back pain   . Chronic headaches    MIGRAINES  . Contraceptive management 01/25/2013  . Depression   . Encounter for Nexplanon removal 02/27/2015  . Irregular bleeding 06/03/2013  . Miscarriage 03/27/2013  . Nexplanon insertion 04/11/2013   nexplanon inserted 04/11/13 remove 3/32/18  . Right sided abdominal pain   . Scoliosis   . Seasonal allergies 07/03/2012  . Sinus infection 08/27/2013  . Supervision of normal pregnancy 08/27/2015    [facility]iated Care at   7+5 weeks  FOB  Dating By  LMP  Pap  GC/CT Initial:                36+wks: Genetic Screen NT/IT:  CF screen  Anatomic [place]  Flu vaccine  Tdap Recommended ~ 28wks Glucose Screen  2 hr GBS  Feed Preference  Contraception  Circumcision  Childbirth Classes  Pediatrician    . Threatened abortion in early pregnancy 03/20/2013  . Vaginal discharge 10/03/2012  . Yeast infection 07/03/2012    Past Surgical History:  Procedure Laterality Date  . WISDOM TOOTH EXTRACTION      Family History  Problem Relation Age of Onset  . Asthma Mother   . Asthma Sister   . Asthma Brother   . Asthma Daughter   . Diabetes Maternal Grandmother   . Hypertension Maternal Grandmother   . Asthma Maternal Grandmother   . Heart disease Maternal Grandmother   . Stroke Maternal Grandmother   . Hypertension Maternal Grandfather   . Asthma Maternal Grandfather   . Heart disease Maternal Grandfather   . Asthma Son   . Colon cancer Neg Hx     Social  History Social History   Tobacco Use  . Smoking status: Never Smoker  . Smokeless tobacco: Never Used  Substance Use Topics  . Alcohol use: No  . Drug use: No    Allergies  Allergen Reactions  . Advil [Ibuprofen] Other (See Comments)    Breakout, heart palpitations and trouble breathing. Cant take Advil but can take Ibuprofen    Current Outpatient Medications  Medication Sig Dispense Refill  . acetaminophen (TYLENOL) 500 MG tablet Take 1,000 mg by mouth 3 (three) times daily as needed for moderate pain or headache.     . etonogestrel (NEXPLANON) 68 MG IMPL implant 1 each once by Subdermal route.    . fluticasone (FLONASE) 50 MCG/ACT nasal spray Place 2 sprays into both nostrils daily.   2  . ibuprofen (ADVIL,MOTRIN) 200 MG tablet Take 400 mg by mouth 3 (three) times daily as needed for headache or moderate pain.     Marland Kitchen loratadine (CLARITIN) 10 MG tablet Take 10 mg by mouth daily.    . traMADol (ULTRAM) 50 MG tablet Take 1 tablet (50 mg total) by mouth every 6 (six) hours as needed. 15 tablet 0   No current facility-administered medications for this visit.  Review of Systems Review of Systems  Constitutional: Negative.   Respiratory: Negative.   Cardiovascular: Negative.     Blood pressure 104/64, pulse 70, resp. rate 16, height 5\' 2"  (1.575 m), weight 174 lb (78.9 kg), not currently breastfeeding.  Physical Exam Physical Exam  Constitutional: She is oriented to person, place, and time. She appears well-developed and well-nourished.  HENT:  Mouth/Throat:    Eyes: Conjunctivae are normal. No scleral icterus.  Neck: Neck supple.  Cardiovascular: Normal rate, regular rhythm and normal heart sounds.  Pulmonary/Chest: Effort normal and breath sounds normal.  Abdominal: Soft. Normal appearance. There is no tenderness. A hernia is present. Hernia confirmed positive in the right inguinal area.    Lymphadenopathy:    She has no cervical adenopathy.  Neurological: She is  alert and oriented to person, place, and time.  Skin: Skin is warm and dry.       Assessment    Right inguinal hernia.     Plan  Hernia precautions and incarceration were discussed with the patient. If they develop symptoms of an incarcerated hernia, they were encouraged to seek prompt medical attention.  Reviewed pros and cons of mesh reinforcement.  Will plan on a primary repair.   With reaction to aspirin, no anti-inflammatories to be used during surgery or post op.    No picking up her kids for at least two weeks after surgery.   HPI, Physical Exam, Assessment and Plan have been scribed under the direction and in the presence of Hervey Ard, MD.  Gaspar Cola, CMA  I have completed the exam and reviewed the above documentation for accuracy and completeness.  I agree with the above.  Haematologist has been used and any errors in dictation or transcription are unintentional.  Hervey Ard, M.D., F.A.C.S.  Gaspar Cola 04/13/2017, 10:15 AM

## 2017-04-13 NOTE — Patient Instructions (Addendum)

## 2017-04-16 MED ORDER — CEFAZOLIN SODIUM-DEXTROSE 2-4 GM/100ML-% IV SOLN: 2.0000 g | INTRAVENOUS | Status: DC

## 2017-04-17 ENCOUNTER — Encounter: Admission: RE | Payer: Self-pay | Source: Ambulatory Visit

## 2017-04-17 ENCOUNTER — Ambulatory Visit: Admission: RE | Admit: 2017-04-17 | Payer: 59 | Source: Ambulatory Visit | Admitting: General Surgery

## 2017-04-17 ENCOUNTER — Telehealth: Payer: Self-pay | Admitting: *Deleted

## 2017-04-17 ENCOUNTER — Encounter: Payer: Self-pay | Admitting: *Deleted

## 2017-04-17 SURGERY — REPAIR, HERNIA, INGUINAL, ADULT
Anesthesia: Choice | Laterality: Right

## 2017-04-17 NOTE — Progress Notes (Signed)
Patient cancelled surgery for today, 04-17-17 with hospital staff. Per Dr. Bary Castilla, patient did not have transportation for surgery and contacted hospital staff over the weekend.   Per Dr. Bary Castilla, if the patient reschedules hernia surgery, patient will need to be the last scheduled case of the day.

## 2017-04-17 NOTE — Telephone Encounter (Signed)
Patient called the office to report that her transportation for day of surgery today had an emergency and was unable to be with her at the hospital today. The patient wishes to get her surgery rescheduled for later this week.   Surgery has been rescheduled for Wednesday, 04-19-17 at [facility] with Dr. Bary Castilla. The patient is aware that this will probably be her last chance reschedule and the importance of showing up for surgery on Wednesday. She verbalizes understanding.

## 2017-04-18 MED ORDER — CEFAZOLIN SODIUM-DEXTROSE 2-4 GM/100ML-% IV SOLN
2.0000 g | INTRAVENOUS | Status: AC
  Administered 2017-04-19: 2 g via INTRAVENOUS

## 2017-04-19 ENCOUNTER — Ambulatory Visit: Payer: 59 | Admitting: Anesthesiology

## 2017-04-19 ENCOUNTER — Ambulatory Visit
Admission: RE | Admit: 2017-04-19 | Discharge: 2017-04-19 | Disposition: A | Discharge status: Home / Self Care | Payer: 59 | Source: Ambulatory Visit | Attending: General Surgery | Admitting: General Surgery

## 2017-04-19 ENCOUNTER — Encounter
Admission: RE | Disposition: A | Discharge status: Home / Self Care | Payer: Self-pay | Source: Ambulatory Visit | Attending: General Surgery

## 2017-04-19 ENCOUNTER — Encounter: Payer: Self-pay | Admitting: *Deleted

## 2017-04-19 DIAGNOSIS — D1779 Benign lipomatous neoplasm of other sites: Secondary | ICD-10-CM | POA: Insufficient documentation

## 2017-04-19 DIAGNOSIS — K4090 Unilateral inguinal hernia, without obstruction or gangrene, not specified as recurrent: Secondary | ICD-10-CM | POA: Insufficient documentation

## 2017-04-19 HISTORY — PX: INGUINAL HERNIA REPAIR: SHX194

## 2017-04-19 LAB — POCT PREGNANCY, URINE: PREG TEST UR: NEGATIVE

## 2017-04-19 SURGERY — REPAIR, HERNIA, INGUINAL, ADULT
Anesthesia: General | Laterality: Right | Wound class: Clean

## 2017-04-19 MED ORDER — HYDROMORPHONE HCL 1 MG/ML IJ SOLN
INTRAMUSCULAR | Status: AC
  Administered 2017-04-19: 0.5 mg via INTRAVENOUS
  Filled 2017-04-19: qty 1

## 2017-04-19 MED ORDER — HYDROCODONE-ACETAMINOPHEN 5-325 MG PO TABS
ORAL_TABLET | ORAL | Status: AC
  Filled 2017-04-19: qty 1

## 2017-04-19 MED ORDER — FAMOTIDINE 20 MG PO TABS
ORAL_TABLET | ORAL | Status: AC
  Administered 2017-04-19: 20 mg via ORAL
  Filled 2017-04-19: qty 1

## 2017-04-19 MED ORDER — ACETAMINOPHEN 10 MG/ML IV SOLN
INTRAVENOUS | Status: DC | PRN
  Administered 2017-04-19: 1000 mg via INTRAVENOUS

## 2017-04-19 MED ORDER — CEFAZOLIN SODIUM-DEXTROSE 2-4 GM/100ML-% IV SOLN
INTRAVENOUS | Status: AC
  Filled 2017-04-19: qty 100

## 2017-04-19 MED ORDER — HYDROCODONE-ACETAMINOPHEN 5-325 MG PO TABS: 1.0000 | ORAL_TABLET | ORAL | 0 refills | Status: DC | PRN

## 2017-04-19 MED ORDER — ONDANSETRON HCL 4 MG/2ML IJ SOLN
INTRAMUSCULAR | Status: DC | PRN
  Administered 2017-04-19: 4 mg via INTRAVENOUS

## 2017-04-19 MED ORDER — FENTANYL CITRATE (PF) 100 MCG/2ML IJ SOLN
INTRAMUSCULAR | Status: AC
  Administered 2017-04-19: 25 ug via INTRAVENOUS
  Filled 2017-04-19: qty 2

## 2017-04-19 MED ORDER — PHENYLEPHRINE HCL 10 MG/ML IJ SOLN
INTRAMUSCULAR | Status: DC | PRN
  Administered 2017-04-19: 100 ug via INTRAVENOUS
  Administered 2017-04-19: 200 ug via INTRAVENOUS

## 2017-04-19 MED ORDER — PROPOFOL 10 MG/ML IV BOLUS
INTRAVENOUS | Status: DC | PRN
  Administered 2017-04-19: 130 mg via INTRAVENOUS

## 2017-04-19 MED ORDER — LACTATED RINGERS IV SOLN
INTRAVENOUS | Status: DC
  Administered 2017-04-19: 14:00:00 via INTRAVENOUS

## 2017-04-19 MED ORDER — ONDANSETRON HCL 4 MG/2ML IJ SOLN
4.0000 mg | Freq: Once | INTRAMUSCULAR | Status: DC | PRN
Start: 2017-04-19 — End: 2017-04-19

## 2017-04-19 MED ORDER — BUPIVACAINE-EPINEPHRINE (PF) 0.5% -1:200000 IJ SOLN
INTRAMUSCULAR | Status: DC | PRN
  Administered 2017-04-19: 10 mL
  Administered 2017-04-19: 20 mL via PERINEURAL

## 2017-04-19 MED ORDER — FENTANYL CITRATE (PF) 100 MCG/2ML IJ SOLN
INTRAMUSCULAR | Status: DC | PRN
  Administered 2017-04-19 (×4): 25 ug via INTRAVENOUS

## 2017-04-19 MED ORDER — BUPIVACAINE-EPINEPHRINE (PF) 0.5% -1:200000 IJ SOLN
INTRAMUSCULAR | Status: AC
  Filled 2017-04-19: qty 30

## 2017-04-19 MED ORDER — FAMOTIDINE 20 MG PO TABS
20.0000 mg | ORAL_TABLET | Freq: Once | ORAL | Status: AC
  Administered 2017-04-19: 20 mg via ORAL

## 2017-04-19 MED ORDER — HYDROMORPHONE HCL 1 MG/ML IJ SOLN
0.5000 mg | INTRAMUSCULAR | Status: AC | PRN
  Administered 2017-04-19 (×4): 0.5 mg via INTRAVENOUS

## 2017-04-19 MED ORDER — HYDROCODONE-ACETAMINOPHEN 5-325 MG PO TABS
1.0000 | ORAL_TABLET | ORAL | Status: AC | PRN
  Administered 2017-04-19: 1 via ORAL

## 2017-04-19 MED ORDER — LIDOCAINE HCL (CARDIAC) 20 MG/ML IV SOLN
INTRAVENOUS | Status: DC | PRN
  Administered 2017-04-19: 60 mg via INTRAVENOUS

## 2017-04-19 MED ORDER — FENTANYL CITRATE (PF) 100 MCG/2ML IJ SOLN
25.0000 ug | INTRAMUSCULAR | Status: AC | PRN
  Administered 2017-04-19 (×6): 25 ug via INTRAVENOUS

## 2017-04-19 MED ORDER — GABAPENTIN 300 MG PO CAPS
ORAL_CAPSULE | ORAL | Status: AC
  Administered 2017-04-19: 300 mg via ORAL
  Filled 2017-04-19: qty 1

## 2017-04-19 MED ORDER — DEXAMETHASONE SODIUM PHOSPHATE 10 MG/ML IJ SOLN
INTRAMUSCULAR | Status: DC | PRN
  Administered 2017-04-19: 10 mg via INTRAVENOUS

## 2017-04-19 MED ORDER — ACETAMINOPHEN 10 MG/ML IV SOLN
INTRAVENOUS | Status: AC
  Filled 2017-04-19: qty 100

## 2017-04-19 MED ORDER — MIDAZOLAM HCL 5 MG/5ML IJ SOLN
INTRAMUSCULAR | Status: DC | PRN
  Administered 2017-04-19: 2 mg via INTRAVENOUS

## 2017-04-19 MED ORDER — GABAPENTIN 300 MG PO CAPS
300.0000 mg | ORAL_CAPSULE | ORAL | Status: AC
  Administered 2017-04-19: 300 mg via ORAL

## 2017-04-19 SURGICAL SUPPLY — 33 items
BLADE SURG 15 STRL SS SAFETY (BLADE) ×4 IMPLANT
CANISTER SUCT 1200ML W/VALVE (MISCELLANEOUS) ×2 IMPLANT
CHLORAPREP W/TINT 26ML (MISCELLANEOUS) ×2 IMPLANT
DECANTER SPIKE VIAL GLASS SM (MISCELLANEOUS) ×2 IMPLANT
DRAIN PENROSE 1/4X12 LTX (DRAIN) ×2 IMPLANT
DRAPE LAPAROTOMY 100X77 ABD (DRAPES) ×2 IMPLANT
DRSG TEGADERM 4X4.75 (GAUZE/BANDAGES/DRESSINGS) ×2 IMPLANT
DRSG TELFA 4X3 1S NADH ST (GAUZE/BANDAGES/DRESSINGS) ×2 IMPLANT
ELECT REM PT RETURN 9FT ADLT (ELECTROSURGICAL) ×2
ELECTRODE REM PT RTRN 9FT ADLT (ELECTROSURGICAL) ×1 IMPLANT
GLOVE BIO SURGEON STRL SZ7.5 (GLOVE) ×2 IMPLANT
GLOVE INDICATOR 8.0 STRL GRN (GLOVE) ×2 IMPLANT
GOWN STRL REUS W/ TWL LRG LVL3 (GOWN DISPOSABLE) ×2 IMPLANT
GOWN STRL REUS W/TWL LRG LVL3 (GOWN DISPOSABLE) ×4
KIT TURNOVER KIT A (KITS) ×2 IMPLANT
LABEL OR SOLS (LABEL) ×2 IMPLANT
NDL HYPO 25X1 1.5 SAFETY (NEEDLE) ×1 IMPLANT
NEEDLE HYPO 22GX1.5 SAFETY (NEEDLE) ×4 IMPLANT
NEEDLE HYPO 25X1 1.5 SAFETY (NEEDLE) ×2 IMPLANT
PACK BASIN MINOR ARMC (MISCELLANEOUS) ×2 IMPLANT
STRIP CLOSURE SKIN 1/2X4 (GAUZE/BANDAGES/DRESSINGS) ×2 IMPLANT
SUT PDS AB 0 CT1 27 (SUTURE) ×2 IMPLANT
SUT SURGILON 0 BLK (SUTURE) ×4 IMPLANT
SUT VIC AB 2-0 SH 27 (SUTURE) ×2
SUT VIC AB 2-0 SH 27XBRD (SUTURE) ×1 IMPLANT
SUT VIC AB 3-0 54X BRD REEL (SUTURE) ×1 IMPLANT
SUT VIC AB 3-0 BRD 54 (SUTURE) ×2
SUT VIC AB 3-0 SH 27 (SUTURE) ×2
SUT VIC AB 3-0 SH 27X BRD (SUTURE) ×1 IMPLANT
SUT VIC AB 4-0 FS2 27 (SUTURE) ×2 IMPLANT
SWABSTK COMLB BENZOIN TINCTURE (MISCELLANEOUS) ×2 IMPLANT
SYR 10ML LL (SYRINGE) ×2 IMPLANT
SYR 3ML LL SCALE MARK (SYRINGE) ×2 IMPLANT

## 2017-04-19 NOTE — Progress Notes (Signed)
Ice pack to right lower abdomen

## 2017-04-19 NOTE — Anesthesia Procedure Notes (Signed)
Procedure Name: LMA Insertion Date/Time: 04/19/2017 2:00 PM Performed by: Eben Burow, CRNA Pre-anesthesia Checklist: Patient identified, Emergency Drugs available, Suction available, Timeout performed and Patient being monitored Patient Re-evaluated:Patient Re-evaluated prior to induction Oxygen Delivery Method: Circle system utilized Preoxygenation: Pre-oxygenation with 100% oxygen Induction Type: IV induction LMA: LMA inserted LMA Size: 3.5 Number of attempts: 1 Placement Confirmation: positive ETCO2 and breath sounds checked- equal and bilateral Tube secured with: Tape Dental Injury: Teeth and Oropharynx as per pre-operative assessment

## 2017-04-19 NOTE — Anesthesia Postprocedure Evaluation (Signed)
Anesthesia Post Note  Patient: Deanna Hughes  Procedure(s) Performed: HERNIA REPAIR INGUINAL ADULT (Right )  Patient location during evaluation: PACU Anesthesia Type: General Level of consciousness: awake and alert Pain management: pain level controlled Vital Signs Assessment: post-procedure vital signs reviewed and stable Respiratory status: spontaneous breathing and respiratory function stable Cardiovascular status: stable Anesthetic complications: no     Last Vitals:  Vitals:   04/19/17 1453  BP: (!) 107/59  Pulse: (!) 104  Resp: 18  Temp: 37 C  SpO2: 99%    Last Pain:  Vitals:   04/19/17 1453  PainSc: 9                  Arend Bahl K

## 2017-04-19 NOTE — Progress Notes (Signed)
Dr. Bary Castilla called and states pt may go home

## 2017-04-19 NOTE — Op Note (Signed)
Preoperative diagnosis: Right inguinal hernia.  Postoperative diagnosis: Same, inguinal lipoma.  Operative procedure: Primary repair of right indirect inguinal hernia with excision of lipoma.  Operating Surgeon: Hervey Ard, MD.  Anesthesia: General by LMA, Marcaine 0.5% with 1-200,000 units of epinephrine, 30 cc.  Estimated blood loss: Less than 5 cc  Clinical note: This 27 year old woman is developed a symptomatic right inguinal hernia was admitted for elective repair.  She desired a non-mesh repair.  Kefzol was administered prior to the procedure.  Operative note: With the patient under adequate general anesthesia the area was cleansed with ChloraPrep and draped.  Field block anesthesia was established for postoperative analgesia.  A 5 cm skin line incision along the anticipated course of the inguinal canal was carried down through skin subtendinous tissue with hemostasis achieved by electrocautery.  The external oblique was opened in the direction of its fibers.  The ilioinguinal and iliohypogastric nerves were identified and protected.  The lipoma of the inguinal canal was ligated with a 2-0 Vicryl tie excised and discarded.  This was approximately 1-1/2 x 5 cm in length.  The round ligament was mobilized and divided distally with a 3-0 Vicryl tie.  Proximally it was twisted upon itself to include the hernia sac and transfixed with a 3-0 Vicryl suture ligature followed by 3-0 Vicryl tie.  This was excised and discarded.  The internal ring was obliterated with a 0 Surgilon suture from the iliopubic tract to the undersurface of the transverse abdominis aponeurosis.  The shelving edge of the transverse abdominis aponeurosis was then brought down to the inguinal ligament with interrupted 0 Surgilon sutures without tension.  The external oblique was closed with a running 2-0 Vicryl suture taking care not to encompass the above-mentioned nerves.  Scarpa's fascia was closed with a running 3-0  Vicryl suture and the skin closed with a running 4-0 Vicryl subcuticular suture.  Benzoin, Steri-Strips followed by Telfa and Tegaderm dressing applied.  Patient tolerated procedure well and was taken to recovery room in stable condition.

## 2017-04-19 NOTE — Anesthesia Preprocedure Evaluation (Signed)
Anesthesia Evaluation  Patient identified by MRN, date of birth, ID band Patient awake    Reviewed: Allergy & Precautions, NPO status , Patient's Chart, lab work & pertinent test results  History of Anesthesia Complications Negative for: history of anesthetic complications  Airway Mallampati: II       Dental   Pulmonary neg sleep apnea, neg COPD,           Cardiovascular (-) hypertension(-) Past MI and (-) CHF (-) dysrhythmias (-) Valvular Problems/Murmurs     Neuro/Psych neg Seizures Depression    GI/Hepatic Neg liver ROS, neg GERD  ,  Endo/Other  neg diabetes  Renal/GU negative Renal ROS     Musculoskeletal   Abdominal   Peds  Hematology   Anesthesia Other Findings   Reproductive/Obstetrics                             Anesthesia Physical Anesthesia Plan  ASA: II  Anesthesia Plan: General   Post-op Pain Management:    Induction: Intravenous  PONV Risk Score and Plan: 3 and Ondansetron, Dexamethasone and Midazolam  Airway Management Planned: LMA  Additional Equipment:   Intra-op Plan:   Post-operative Plan:   Informed Consent: I have reviewed the patients History and Physical, chart, labs and discussed the procedure including the risks, benefits and alternatives for the proposed anesthesia with the patient or authorized representative who has indicated his/her understanding and acceptance.     Plan Discussed with:   Anesthesia Plan Comments:         Anesthesia Quick Evaluation

## 2017-04-19 NOTE — Anesthesia Post-op Follow-up Note (Signed)
Anesthesia QCDR form completed.        

## 2017-04-19 NOTE — H&P (Addendum)
27 year old female for right inguinal hernia repair.  No interval change in clinical history or exam. Patient has requested that prosthetic mesh not be utilized.  The patient had shaved the general area plan for surgery 3-4 days ago.  No evidence of active infection.  No contraindication to proceeding.

## 2017-04-19 NOTE — Transfer of Care (Signed)
Immediate Anesthesia Transfer of Care Note  Patient: CHRISTIANE SISTARE  Procedure(s) Performed: HERNIA REPAIR INGUINAL ADULT (Right )  Patient Location: PACU  Anesthesia Type:General  Level of Consciousness: awake, alert , oriented and patient cooperative  Airway & Oxygen Therapy: Patient Spontanous Breathing and Patient connected to nasal cannula oxygen  Post-op Assessment: Report given to RN and Post -op Vital signs reviewed and stable  Post vital signs: Reviewed and stable  Last Vitals:  Vitals Value Taken Time  BP 107/59 04/19/2017  2:53 PM  Temp    Pulse 98 04/19/2017  2:53 PM  Resp 18 04/19/2017  2:53 PM  SpO2 100 % 04/19/2017  2:53 PM  Vitals shown include unvalidated device data.  Last Pain: There were no vitals filed for this visit.       Complications: No apparent anesthesia complications

## 2017-04-20 ENCOUNTER — Encounter: Payer: Self-pay | Admitting: General Surgery

## 2017-05-02 ENCOUNTER — Ambulatory Visit (INDEPENDENT_AMBULATORY_CARE_PROVIDER_SITE_OTHER): Payer: 59 | Admitting: General Surgery

## 2017-05-02 ENCOUNTER — Encounter: Payer: Self-pay | Admitting: General Surgery

## 2017-05-02 VITALS — BP 128/72 | HR 82 | Resp 12 | Ht 64.0 in | Wt 171.0 lb

## 2017-05-02 DIAGNOSIS — K4090 Unilateral inguinal hernia, without obstruction or gangrene, not specified as recurrent: Secondary | ICD-10-CM

## 2017-05-02 NOTE — Patient Instructions (Signed)
The patient is aware to use a heating pad as needed for comfort. Resume activities as tolerated. Proper lifting techniques reviewed.  Return in one month.

## 2017-05-02 NOTE — Progress Notes (Signed)
Patient ID: Deanna Hughes, female   DOB: 04-Aug-1990, 27 y.o.   MRN: [id number]  Chief Complaint  Patient presents with  . Routine Post Op    HPI Deanna Hughes is a 27 y.o. female here today for her post op right inguinal hernia repair done on 04/19/2017. Patient states she is the area is very sore and having some burning pain. She has been using an ice pack daily. Moves her bowels every three days.  HPI  Past Medical History:  Diagnosis Date  . BV (bacterial vaginosis) 04/02/2015  . Chlamydia    treated 6/6  . Chronic back pain   . Chronic headaches    MIGRAINES  . Contraceptive management 01/25/2013  . Depression   . Encounter for Nexplanon removal 02/27/2015  . Irregular bleeding 06/03/2013  . Miscarriage 03/27/2013  . Nexplanon insertion 04/11/2013   nexplanon inserted 04/11/13 remove 3/32/18  . Right sided abdominal pain   . Scoliosis   . Seasonal allergies 07/03/2012  . Sinus infection 08/27/2013  . Supervision of normal pregnancy 08/27/2015    [facility] Initiated Care at   7+5 weeks  FOB  Dating By  LMP  Pap  GC/CT Initial:                36+wks: Genetic Screen NT/IT:  CF screen  Anatomic [place]  Flu vaccine  Tdap Recommended ~ 28wks Glucose Screen  2 hr GBS  Feed Preference  Contraception  Circumcision  Childbirth Classes  Pediatrician    . Threatened abortion in early pregnancy 03/20/2013  . Vaginal discharge 10/03/2012  . Yeast infection 07/03/2012    Past Surgical History:  Procedure Laterality Date  . INGUINAL HERNIA REPAIR Right 04/19/2017   Procedure: HERNIA REPAIR INGUINAL ADULT;  Surgeon: Robert Bellow, MD;  Location: [facility] ORS;  Service: General;  Laterality: Right;  . WISDOM TOOTH EXTRACTION      Family History  Problem Relation Age of Onset  . Asthma Mother   . Asthma Sister   . Asthma Brother   . Asthma Daughter   . Diabetes Maternal Grandmother   . Hypertension Maternal Grandmother   . Asthma Maternal Grandmother   . Heart disease Maternal  Grandmother   . Stroke Maternal Grandmother   . Hypertension Maternal Grandfather   . Asthma Maternal Grandfather   . Heart disease Maternal Grandfather   . Asthma Son   . Colon cancer Neg Hx     Social History Social History   Tobacco Use  . Smoking status: Never Smoker  . Smokeless tobacco: Never Used  Substance Use Topics  . Alcohol use: No  . Drug use: No    Allergies  Allergen Reactions  . Aspirin Shortness Of Breath and Other (See Comments)    Heart beats fast  . Advil [Ibuprofen] Other (See Comments)    Breakout, heart palpitations and trouble breathing. Cant take Advil but can take Ibuprofen    Current Outpatient Medications  Medication Sig Dispense Refill  . acetaminophen (TYLENOL) 500 MG tablet Take 1,000 mg by mouth 3 (three) times daily as needed for moderate pain or headache.     . etonogestrel (NEXPLANON) 68 MG IMPL implant 1 each once by Subdermal route.    . fluticasone (FLONASE) 50 MCG/ACT nasal spray Place 2 sprays into both nostrils daily.   2  . HYDROcodone-acetaminophen (NORCO/VICODIN) 5-325 MG tablet Take 1 tablet by mouth every 4 (four) hours as needed for moderate pain. 20 tablet 0  .  ibuprofen (ADVIL,MOTRIN) 200 MG tablet Take 400 mg by mouth 3 (three) times daily as needed for headache or moderate pain.     Marland Kitchen loratadine (CLARITIN) 10 MG tablet Take 10 mg by mouth daily.     No current facility-administered medications for this visit.     Review of Systems Review of Systems  Constitutional: Negative.   Respiratory: Negative.   Cardiovascular: Negative.     Blood pressure 128/72, pulse 82, resp. rate 12, height 5\' 4"  (1.626 m), weight 171 lb (77.6 kg), not currently breastfeeding.  Physical Exam Physical Exam  Constitutional: She is oriented to person, place, and time. She appears well-developed and well-nourished.  Cardiovascular: Normal rate, regular rhythm and normal heart sounds.  Pulmonary/Chest: Effort normal and breath sounds normal.   Abdominal:    Right inguinal hernia repair is intact and healing well.   Neurological: She is alert and oriented to person, place, and time.  Skin: Skin is warm and dry.    Data Reviewed Primary repair of indirect hernia.  Assessment    Soreness postop in part due to inability to tolerate nonsteroidals.    Plan  The patient is aware to use a heating pad as needed for comfort. Resume activities as tolerated. Proper lifting techniques reviewed.  We will plan to have the patient return to work May 17, 2017 with a 20 pound weight restriction.  Return in one month.   HPI, Physical Exam, Assessment and Plan have been scribed under the direction and in the presence of Hervey Ard, MD.  Gaspar Cola, CMA  I have completed the exam and reviewed the above documentation for accuracy and completeness.  I agree with the above.  Haematologist has been used and any errors in dictation or transcription are unintentional.  Hervey Ard, M.D., F.A.C.S.  Forest Gleason Dannell Raczkowski 05/03/2017, 5:22 PM

## 2017-05-03 ENCOUNTER — Telehealth: Payer: Self-pay | Admitting: *Deleted

## 2017-05-03 NOTE — Telephone Encounter (Signed)
No lifting over 20 pounds for 1 month. Letter for pt to pick up, pt agrees.

## 2017-06-01 ENCOUNTER — Encounter: Payer: Self-pay | Admitting: General Surgery

## 2017-06-01 ENCOUNTER — Ambulatory Visit (INDEPENDENT_AMBULATORY_CARE_PROVIDER_SITE_OTHER): Payer: 59 | Admitting: General Surgery

## 2017-06-01 VITALS — BP 102/64 | HR 84 | Resp 16 | Ht 64.0 in | Wt 176.0 lb

## 2017-06-01 DIAGNOSIS — K4090 Unilateral inguinal hernia, without obstruction or gangrene, not specified as recurrent: Secondary | ICD-10-CM

## 2017-06-01 NOTE — Patient Instructions (Signed)
Follow up as needed. Use good judgement with lifting.  May use heat or ice to the area.

## 2017-06-01 NOTE — Progress Notes (Signed)
Patient ID: Deanna Hughes, female   DOB: 08-01-1990, 27 y.o.   MRN: [id number]  Chief Complaint  Patient presents with  . Routine Post Op    HPI Deanna Hughes is a 27 y.o. female here for follow up from right inguinal hernia surgery done on 04/19/17. She reports that the area still hurts and burns. She states that anything tight in the area is painful. She reports some constipation but no problems with urination. She has been using Tylenol for comfort.  HPI  Past Medical History:  Diagnosis Date  . BV (bacterial vaginosis) 04/02/2015  . Chlamydia    treated 6/6  . Chronic back pain   . Chronic headaches    MIGRAINES  . Contraceptive management 01/25/2013  . Depression   . Encounter for Nexplanon removal 02/27/2015  . Irregular bleeding 06/03/2013  . Miscarriage 03/27/2013  . Nexplanon insertion 04/11/2013   nexplanon inserted 04/11/13 remove 3/32/18  . Right sided abdominal pain   . Scoliosis   . Seasonal allergies 07/03/2012  . Sinus infection 08/27/2013  . Supervision of normal pregnancy 08/27/2015    [facility] at   7+5 weeks  FOB  Dating By  LMP  Pap  GC/CT Initial:                36+wks: Genetic Screen NT/IT:  CF screen  Anatomic [place]  Flu vaccine  Tdap Recommended ~ 28wks Glucose Screen  2 hr GBS  Feed Preference  Contraception  Circumcision  Childbirth Classes  Pediatrician    . Threatened abortion in early pregnancy 03/20/2013  . Vaginal discharge 10/03/2012  . Yeast infection 07/03/2012    Past Surgical History:  Procedure Laterality Date  . INGUINAL HERNIA REPAIR Right 04/19/2017   Procedure: HERNIA REPAIR INGUINAL ADULT;  Surgeon: Robert Bellow, MD;  Location: [facility] ORS;  Service: General;  Laterality: Right;  . WISDOM TOOTH EXTRACTION      Family History  Problem Relation Age of Onset  . Asthma Mother   . Asthma Sister   . Asthma Brother   . Asthma Daughter   . Diabetes Maternal Grandmother   . Hypertension Maternal Grandmother   . Asthma  Maternal Grandmother   . Heart disease Maternal Grandmother   . Stroke Maternal Grandmother   . Hypertension Maternal Grandfather   . Asthma Maternal Grandfather   . Heart disease Maternal Grandfather   . Asthma Son   . Colon cancer Neg Hx     Social History Social History   Tobacco Use  . Smoking status: Never Smoker  . Smokeless tobacco: Never Used  Substance Use Topics  . Alcohol use: No  . Drug use: No    Allergies  Allergen Reactions  . Aspirin Shortness Of Breath and Other (See Comments)    Heart beats fast  . Advil [Ibuprofen] Other (See Comments)    Breakout, heart palpitations and trouble breathing. Cant take Advil but can take Ibuprofen    Current Outpatient Medications  Medication Sig Dispense Refill  . acetaminophen (TYLENOL) 500 MG tablet Take 1,000 mg by mouth 3 (three) times daily as needed for moderate pain or headache.     . etonogestrel (NEXPLANON) 68 MG IMPL implant 1 each once by Subdermal route.    . fluticasone (FLONASE) 50 MCG/ACT nasal spray Place 2 sprays into both nostrils daily.   2  . loratadine (CLARITIN) 10 MG tablet Take 10 mg by mouth daily.     No current facility-administered  medications for this visit.     Review of Systems Review of Systems  Constitutional: Negative.   Respiratory: Negative.   Cardiovascular: Negative.   Gastrointestinal: Positive for constipation. Negative for abdominal distention, abdominal pain, anal bleeding, blood in stool, diarrhea, nausea, rectal pain and vomiting.    Blood pressure 102/64, pulse 84, resp. rate 16, height 5\' 4"  (1.626 m), weight 176 lb (79.8 kg), last menstrual period 04/11/2017, not currently breastfeeding.  Physical Exam Physical Exam  Constitutional: She is oriented to person, place, and time. She appears well-developed and well-nourished.  Abdominal:    Right inguinal hernia incision well healed.   Neurological: She is alert and oriented to person, place, and time.  Skin: Skin is  warm and dry.  Psychiatric: She has a normal mood and affect.       Assessment    Steady progress post right primary inguinal hernia repair.    Plan     Unfortunately, the patient is unable to tolerate anti-inflammatories.  Local heat been encouraged.  Proper lifting technique reviewed.  She may increase her activities at home as tolerated.  Work restrictions remain in place until June 17, 2017.  Follow up as needed. Use good judgement with lifting.      HPI, Physical Exam, Assessment and Plan have been scribed under the direction and in the presence of Robert Bellow, MD  Concepcion Living, LPN   I have completed the exam and reviewed the above documentation for accuracy and completeness.  I agree with the above.  Haematologist has been used and any errors in dictation or transcription are unintentional.  Hervey Ard, M.D., F.A.C.S.  Forest Gleason Larue Drawdy 06/01/2017, 9:17 AM

## 2017-07-08 ENCOUNTER — Emergency Department (HOSPITAL_COMMUNITY)
Admission: EM | Admit: 2017-07-08 | Discharge: 2017-07-08 | Disposition: A | Discharge status: Home / Self Care | Payer: 59 | Attending: Emergency Medicine | Admitting: Emergency Medicine

## 2017-07-08 ENCOUNTER — Encounter (HOSPITAL_COMMUNITY): Payer: Self-pay | Admitting: *Deleted

## 2017-07-08 ENCOUNTER — Other Ambulatory Visit: Payer: Self-pay

## 2017-07-08 DIAGNOSIS — R040 Epistaxis: Secondary | ICD-10-CM | POA: Insufficient documentation

## 2017-07-08 DIAGNOSIS — Z79899 Other long term (current) drug therapy: Secondary | ICD-10-CM | POA: Insufficient documentation

## 2017-07-08 DIAGNOSIS — F329 Major depressive disorder, single episode, unspecified: Secondary | ICD-10-CM | POA: Insufficient documentation

## 2017-07-08 MED ORDER — OXYMETAZOLINE HCL 0.05 % NA SOLN: NASAL | 0 refills | Status: DC

## 2017-07-08 NOTE — Discharge Instructions (Addendum)
Use the pinching technique as we discussed.  Remember you need to hold it for 20 minutes.  If no starts to rebleed.  Pinch it for 20 minutes but does not stop but then blow out all the clots pinch it for another 20 minutes.  That does not stop it then use the Afrin nasal spray.  If that does not stop it then return.

## 2017-07-08 NOTE — ED Notes (Signed)
Pt works here in dietary  At home, and had spontaneous bleeding from L nares  Bleeding is currently resolved

## 2017-07-08 NOTE — ED Triage Notes (Signed)
Onset of right sided nose bleed about 30 minutes ago

## 2017-07-08 NOTE — ED Provider Notes (Signed)
[facility] EMERGENCY DEPARTMENT Provider Note   CSN: [id number] Arrival date & time: 07/08/17  1941     History   Chief Complaint Chief Complaint  Patient presents with  . Epistaxis    HPI Deanna SEAWRIGHT is a 27 y.o. female.  Patient with bleeding from her right nares shortly prior to arrival.  Patient does not have any bleeding disorder.  No history of prior nosebleeds.  Patient did have a history of seasonal allergies and was on Flonase but has not been on that for a long period of time.  Patient does not have a current upper respiratory infection no nasal congestion.  Patient states shortly after the bleed patient did have a forehead headache.  It was not severe.  She did not have any complaints prior to the nosebleed.  The bleeding stopped shortly after arrival spontaneously.     Past Medical History:  Diagnosis Date  . BV (bacterial vaginosis) 04/02/2015  . Chlamydia    treated 6/6  . Chronic back pain   . Chronic headaches    MIGRAINES  . Contraceptive management 01/25/2013  . Depression   . Encounter for Nexplanon removal 02/27/2015  . Irregular bleeding 06/03/2013  . Miscarriage 03/27/2013  . Nexplanon insertion 04/11/2013   nexplanon inserted 04/11/13 remove 3/32/18  . Right sided abdominal pain   . Scoliosis   . Seasonal allergies 07/03/2012  . Sinus infection 08/27/2013  . Supervision of normal pregnancy 08/27/2015    [facility] at   7+5 weeks  FOB  Dating By  LMP  Pap  GC/CT Initial:                36+wks: Genetic Screen NT/IT:  CF screen  Anatomic [place]  Flu vaccine  Tdap Recommended ~ 28wks Glucose Screen  2 hr GBS  Feed Preference  Contraception  Circumcision  Childbirth Classes  Pediatrician    . Threatened abortion in early pregnancy 03/20/2013  . Vaginal discharge 10/03/2012  . Yeast infection 07/03/2012    Patient Active Problem List   Diagnosis Date Noted  . Right inguinal hernia 12/14/2015  . BV (bacterial vaginosis) 04/02/2015  .  Sinus infection 08/27/2013  . Irregular bleeding 06/03/2013  . Seasonal allergies 07/03/2012  . Defecation urgency     Past Surgical History:  Procedure Laterality Date  . INGUINAL HERNIA REPAIR Right 04/19/2017   Procedure: HERNIA REPAIR INGUINAL ADULT;  Surgeon: Robert Bellow, MD;  Location: [facility] ORS;  Service: General;  Laterality: Right;  . WISDOM TOOTH EXTRACTION       OB History    Gravida  5   Para  3   Term  3   Preterm  0   AB  2   Living  3     SAB  2   TAB  0   Ectopic  0   Multiple  0   Live Births  3        Obstetric Comments  1st Menstrual Cycle:  15  1st Pregnancy:  18          Home Medications    Prior to Admission medications   Medication Sig Start Date End Date Taking? Authorizing Provider  acetaminophen (TYLENOL) 500 MG tablet Take 1,000 mg by mouth 3 (three) times daily as needed for moderate pain or headache.    Yes [provider]  amoxicillin (AMOXIL) 500 MG capsule TAKE ONE CAPSULE BY MOUTH THREE TIMES DAILY 06/29/17  Yes [provider]  etonogestrel (NEXPLANON) 68 MG IMPL implant 1 each once by Subdermal route.   Yes [provider]  fluticasone (FLONASE) 50 MCG/ACT nasal spray Place 2 sprays into both nostrils daily as needed for allergies.  12/19/16  Yes [provider]  lidocaine (XYLOCAINE) 2 % solution 15 mLs as directed.  06/29/17   [provider]  oxymetazoline (AFRIN NASAL SPRAY) 0.05 % nasal spray 2 sprays into bleeding nostril every 6 hours as needed for bleeding only used for 2 days. 07/08/17   Fredia Sorrow, MD    Family History Family History  Problem Relation Age of Onset  . Asthma Mother   . Asthma Sister   . Asthma Brother   . Asthma Daughter   . Diabetes Maternal Grandmother   . Hypertension Maternal Grandmother   . Asthma Maternal Grandmother   . Heart disease Maternal Grandmother   . Stroke Maternal Grandmother   . Hypertension Maternal Grandfather   .  Asthma Maternal Grandfather   . Heart disease Maternal Grandfather   . Asthma Son   . Colon cancer Neg Hx     Social History Social History   Tobacco Use  . Smoking status: Never Smoker  . Smokeless tobacco: Never Used  Substance Use Topics  . Alcohol use: No  . Drug use: No     Allergies   Aspirin and Advil [ibuprofen]   Review of Systems Review of Systems  Constitutional: Negative for fever.  HENT: Positive for nosebleeds. Negative for congestion.   Eyes: Negative for redness, itching and visual disturbance.  Respiratory: Negative for shortness of breath.   Cardiovascular: Negative for chest pain.  Gastrointestinal: Negative for abdominal pain.  Musculoskeletal: Negative for neck stiffness.  Skin: Negative for rash and wound.  Allergic/Immunologic: Positive for environmental allergies. Negative for immunocompromised state.  Neurological: Positive for headaches. Negative for syncope.  Hematological: Does not bruise/bleed easily.     Physical Exam Updated Vital Signs BP 128/88 (BP Location: Right Arm)   Pulse (!) 101   Temp 98.6 F (37 C) (Oral)   Resp 20   Ht 1.575 m (5\' 2" )   Wt 78.5 kg (173 lb)   LMP 05/28/2017   SpO2 100%   BMI 31.64 kg/m   Physical Exam  Constitutional: She is oriented to person, place, and time. She appears well-developed and well-nourished. No distress.  HENT:  Head: Normocephalic and atraumatic.  Mouth/Throat: Oropharynx is clear and moist.  No blood draining down the posterior pharynx.  Left nares normal.  Right nares with some fresh blood but no active bleeding anterior part of the nose.  Eyes: Pupils are equal, round, and reactive to light. Conjunctivae and EOM are normal.  Neck: Normal range of motion. Neck supple.  Cardiovascular: Normal rate, regular rhythm and normal heart sounds.  Pulmonary/Chest: Effort normal and breath sounds normal. No respiratory distress.  Abdominal: Soft. Bowel sounds are normal.  Neurological: She  is alert and oriented to person, place, and time. No cranial nerve deficit or sensory deficit. She exhibits normal muscle tone. Coordination normal.  Skin: Skin is warm.  Nursing note and vitals reviewed.    ED Treatments / Results  Labs (all labs ordered are listed, but only abnormal results are displayed) Labs Reviewed - No data to display  EKG None  Radiology No results found.  Procedures Procedures (including critical care time)  Medications Ordered in ED Medications - No data to display   Initial Impression / Assessment and Plan /  ED Course  I have reviewed the triage vital signs and the nursing notes.  Pertinent labs & imaging results that were available during my care of the patient were reviewed by me and considered in my medical decision making (see chart for details).    Patient with onset of sudden nosebleed shortly prior to arrival.  Stopped here spontaneously.  No further active bleeding.  Patient is not on nasal steroids for her allergies currently are has not been on recently.  No prior history of nosebleed.  Shortly after the nosebleed occurred patient did have a headache more forehead area.  Not severe.  No loss of conscious no nausea vomiting no visual changes.  Bleeding currently controlled.  Gave patient instructions on how to handle her recurrent bleed.  Also gave prescription for Afrin.  Patient stable for discharge home nontoxic no acute distress.  Vital signs normal.   Final Clinical Impressions(s) / ED Diagnoses   Final diagnoses:  Right-sided epistaxis    ED Discharge Orders        Ordered    oxymetazoline (AFRIN NASAL SPRAY) 0.05 % nasal spray     07/08/17 2037       Fredia Sorrow, MD 07/08/17 2110

## 2017-08-15 DIAGNOSIS — R51 Headache: Secondary | ICD-10-CM | POA: Diagnosis not present

## 2017-08-22 ENCOUNTER — Encounter: Payer: Self-pay | Admitting: Women's Health

## 2017-08-22 ENCOUNTER — Ambulatory Visit (INDEPENDENT_AMBULATORY_CARE_PROVIDER_SITE_OTHER): Payer: 59 | Admitting: Women's Health

## 2017-08-22 VITALS — BP 101/63 | HR 97 | Ht 62.0 in | Wt 167.4 lb

## 2017-08-22 DIAGNOSIS — N898 Other specified noninflammatory disorders of vagina: Secondary | ICD-10-CM | POA: Diagnosis not present

## 2017-08-22 LAB — POCT WET PREP (WET MOUNT)
Clue Cells Wet Prep Whiff POC: NEGATIVE
Trichomonas Wet Prep HPF POC: ABSENT

## 2017-08-22 NOTE — Progress Notes (Signed)
   GYN VISIT Patient name: Deanna Hughes MRN [id number]  Date of birth: 1990-07-18 Chief Complaint:   white vaginal discharge  History of Present Illness:   Deanna Hughes is a 27 y.o. [phone] African American female being seen today for report of white vaginal d/c x 2-3wks. Has not tried any otc meds. No itching/odor/irritation, no change in sex partners, declines gc/ct testing, was neg in Jan.     Patient's last menstrual period was 08/05/2017. The current method of family planning is Nexplanon. Last pap 02/06/14. Results were:  normal Review of Systems:   Pertinent items are noted in HPI Denies fever/chills, dizziness, headaches, visual disturbances, fatigue, shortness of breath, chest pain, abdominal pain, vomiting, abnormal vaginal discharge/itching/odor/irritation, problems with periods, bowel movements, urination, or intercourse unless otherwise stated above.  Pertinent History Reviewed:  Reviewed past medical,surgical, social, obstetrical and family history.  Reviewed problem list, medications and allergies. Physical Assessment:   Vitals:   08/22/17 1421  BP: 101/63  Pulse: 97  Weight: 167 lb 6.4 oz (75.9 kg)  Height: 5\' 2"  (1.575 m)  Body mass index is 30.62 kg/m.       Physical Examination:   General appearance: alert, well appearing, and in no distress  Mental status: alert, oriented to person, place, and time  Skin: warm & dry   Cardiovascular: normal heart rate noted  Respiratory: normal respiratory effort, no distress  Abdomen: soft, non-tender   Pelvic: VULVA: normal appearing vulva with no masses, tenderness or lesions, VAGINA: normal appearing vagina with normal color and discharge, no lesions, CERVIX: normal appearing cervix without discharge or lesions  Extremities: no edema   Results for orders placed or performed in visit on 08/22/17 (from the past 24 hour(s))  POCT Wet Prep [facility])   Collection Time: 08/22/17  2:50 PM  Result Value Ref Range   Source Wet Prep POC vaginal    WBC, Wet Prep HPF POC few    Bacteria Wet Prep HPF POC Few Few   BACTERIA WET PREP MORPHOLOGY POC     Clue Cells Wet Prep HPF POC None None   Clue Cells Wet Prep Whiff POC Negative Whiff    Yeast Wet Prep HPF POC None None   KOH Wet Prep POC  None   Trichomonas Wet Prep HPF POC Absent Absent    Assessment & Plan:  1) Normal vaginal d/c  2) Past due for pap> schedule  Meds: No orders of the defined types were placed in this encounter.   Orders Placed This Encounter  Procedures  . POCT Wet Prep [facility])    Return in about 1 month (around 09/22/2017) for Pap & physical.  Roma Schanz CNM, [facility] 08/22/2017 2:51 PM

## 2017-09-28 ENCOUNTER — Ambulatory Visit (INDEPENDENT_AMBULATORY_CARE_PROVIDER_SITE_OTHER): Payer: 59 | Admitting: Women's Health

## 2017-09-28 ENCOUNTER — Encounter: Payer: Self-pay | Admitting: Women's Health

## 2017-09-28 ENCOUNTER — Other Ambulatory Visit (HOSPITAL_COMMUNITY)
Admission: RE | Admit: 2017-09-28 | Discharge: 2017-09-28 | Disposition: A | Discharge status: Home / Self Care | Payer: 59 | Source: Ambulatory Visit | Attending: Adult Health | Admitting: Adult Health

## 2017-09-28 VITALS — BP 111/74 | HR 92 | Ht 63.0 in | Wt 164.0 lb

## 2017-09-28 DIAGNOSIS — N926 Irregular menstruation, unspecified: Secondary | ICD-10-CM | POA: Diagnosis not present

## 2017-09-28 DIAGNOSIS — Z01411 Encounter for gynecological examination (general) (routine) with abnormal findings: Secondary | ICD-10-CM

## 2017-09-28 DIAGNOSIS — Z3009 Encounter for other general counseling and advice on contraception: Secondary | ICD-10-CM | POA: Insufficient documentation

## 2017-09-28 DIAGNOSIS — Z862 Personal history of diseases of the blood and blood-forming organs and certain disorders involving the immune mechanism: Secondary | ICD-10-CM | POA: Diagnosis not present

## 2017-09-28 DIAGNOSIS — Z01419 Encounter for gynecological examination (general) (routine) without abnormal findings: Secondary | ICD-10-CM | POA: Diagnosis not present

## 2017-09-28 DIAGNOSIS — Z113 Encounter for screening for infections with a predominantly sexual mode of transmission: Secondary | ICD-10-CM

## 2017-09-28 LAB — POCT WET PREP (WET MOUNT)
Clue Cells Wet Prep Whiff POC: NEGATIVE
Trichomonas Wet Prep HPF POC: ABSENT

## 2017-09-28 MED ORDER — MEGESTROL ACETATE 40 MG PO TABS: ORAL_TABLET | ORAL | 1 refills | Status: DC

## 2017-09-28 NOTE — Progress Notes (Signed)
WELL-WOMAN EXAMINATION Patient name: Deanna Hughes MRN [id number]  Date of birth: 02-14-1990 Chief Complaint:   Gynecologic Exam (having irregular periods and pain; has Nexplanon)  History of Present Illness:   Deanna Hughes is a 27 y.o. [phone] African American female being seen today for a routine well-woman exam.  Current complaints: bleeding x 2wks on nexplanon w/ pain lower abdomen. No sex in 2mths, hasn't changed sex partners. Denies abnormal discharge, itching/odor/irritation.  Has had to use megace in past. Also reports cyst/boil on Rt breast that popped up during her pregnancy in 2018, drained itself, but 'never went away'.        Has h/o anemia, wants to check CBC No LMP recorded. (Menstrual status: Irregular Periods). The current method of family planning is Nexplanon inserted 06/30/16 Last pap 02/06/14. Results were: normal Last mammogram: never. Results were: n/a Last colonoscopy: never. Results were: n/a  Review of Systems:   Pertinent items are noted in HPI Denies any headaches, blurred vision, fatigue, shortness of breath, chest pain, abdominal pain, abnormal vaginal discharge/itching/odor/irritation, problems with periods, bowel movements, urination, or intercourse unless otherwise stated above. Pertinent History Reviewed:  Reviewed past medical,surgical, social and family history.  Reviewed problem list, medications and allergies. Physical Assessment:   Vitals:   09/28/17 0909  BP: 111/74  Pulse: 92  Weight: 164 lb (74.4 kg)  Height: 5\' 3"  (1.6 m)  Body mass index is 29.05 kg/m.        Physical Examination:   General appearance - well appearing, and in no distress  Mental status - alert, oriented to person, place, and time  Psych:  She has a normal mood and affect  Skin - warm and dry, normal color, no suspicious lesions noted  Chest - effort normal, all lung fields clear to auscultation bilaterally  Heart - normal rate and regular rhythm  Neck:  midline  trachea, no thyromegaly or nodules  Breasts - breasts appear normal, no suspicious masses, no skin or nipple changes or  axillary nodes; Rt breast w/ hyperpigmented firm area at 3 o'clock Rt  Abdomen - soft, nontender, nondistended, no masses or organomegaly  Pelvic - VULVA: normal appearing vulva with no masses, tenderness or lesions  VAGINA: normal appearing vagina with normal color and mod amt menstrual colored nonodorous discharge, no lesions  CERVIX: normal appearing cervix without discharge or lesions, no CMT  Thin prep pap is done w/ reflex HR HPV cotesting  UTERUS: uterus is felt to be normal size, shape, consistency and nontender   ADNEXA: No adnexal masses noted, small amt tenderness Rt side  Extremities:  No swelling or varicosities noted  Results for orders placed or performed in visit on 09/28/17 (from the past 24 hour(s))  POCT Wet Prep [facility])   Collection Time: 09/28/17  9:56 AM  Result Value Ref Range   Source Wet Prep POC vaginal    WBC, Wet Prep HPF POC none    Bacteria Wet Prep HPF POC Few Few   BACTERIA WET PREP MORPHOLOGY POC     Clue Cells Wet Prep HPF POC None None   Clue Cells Wet Prep Whiff POC Negative Whiff    Yeast Wet Prep HPF POC None None   KOH Wet Prep POC     Trichomonas Wet Prep HPF POC Absent Absent    Assessment & Plan:  1) Well-Woman Exam  2) Irregular bleeding w/ nexplanon> neg wet prep, send gc/ct, rx megace  3) STD screen> gc/ct, hiv, rpr  Labs/procedures today: pap w/ gc/ct, hiv, rpr, wet prep  Mammogram @25yo  or sooner if problems Colonoscopy @25yo  or sooner if problems  Orders Placed This Encounter  Procedures  . CBC  . HIV antibody  . RPR  . POCT Wet Prep [facility])    Follow-up: Return in about 1 year (around 09/29/2018) for Physical.  [place], [facility] 09/28/2017 9:57 AM

## 2017-09-29 LAB — CBC
Hematocrit: 40.4 % (ref 34.0–46.6)
Hemoglobin: 13.3 g/dL (ref 11.1–15.9)
MCH: 29.2 pg (ref 26.6–33.0)
MCHC: 32.9 g/dL (ref 31.5–35.7)
MCV: 89 fL (ref 79–97)
PLATELETS: 241 10*3/uL (ref 150–450)
RBC: 4.55 x10E6/uL (ref 3.77–5.28)
RDW: 12.7 % (ref 12.3–15.4)
WBC: 5.8 10*3/uL (ref 3.4–10.8)

## 2017-09-29 LAB — RPR: RPR Ser Ql: NONREACTIVE

## 2017-09-29 LAB — HIV ANTIBODY (ROUTINE TESTING W REFLEX): HIV SCREEN 4TH GENERATION: NONREACTIVE

## 2017-10-02 LAB — CYTOLOGY - PAP
CHLAMYDIA, DNA PROBE: NEGATIVE
DIAGNOSIS: NEGATIVE
NEISSERIA GONORRHEA: NEGATIVE

## 2017-11-02 ENCOUNTER — Ambulatory Visit (INDEPENDENT_AMBULATORY_CARE_PROVIDER_SITE_OTHER): Payer: 59

## 2017-11-02 ENCOUNTER — Other Ambulatory Visit: Payer: Self-pay | Admitting: Advanced Practice Midwife

## 2017-11-02 ENCOUNTER — Ambulatory Visit (INDEPENDENT_AMBULATORY_CARE_PROVIDER_SITE_OTHER): Payer: 59 | Admitting: Advanced Practice Midwife

## 2017-11-02 ENCOUNTER — Other Ambulatory Visit: Payer: Self-pay

## 2017-11-02 ENCOUNTER — Encounter: Payer: Self-pay | Admitting: Advanced Practice Midwife

## 2017-11-02 VITALS — BP 115/72 | HR 79 | Ht 62.0 in | Wt 166.0 lb

## 2017-11-02 DIAGNOSIS — G8929 Other chronic pain: Secondary | ICD-10-CM | POA: Diagnosis not present

## 2017-11-02 DIAGNOSIS — N83201 Unspecified ovarian cyst, right side: Secondary | ICD-10-CM

## 2017-11-02 DIAGNOSIS — R1031 Right lower quadrant pain: Secondary | ICD-10-CM | POA: Diagnosis not present

## 2017-11-02 DIAGNOSIS — Z113 Encounter for screening for infections with a predominantly sexual mode of transmission: Secondary | ICD-10-CM | POA: Diagnosis not present

## 2017-11-02 DIAGNOSIS — R300 Dysuria: Secondary | ICD-10-CM

## 2017-11-02 MED ORDER — DRONABINOL 2.5 MG PO CAPS: 2.5000 mg | ORAL_CAPSULE | Freq: Two times a day (BID) | ORAL | 3 refills | Status: DC

## 2017-11-02 NOTE — Progress Notes (Signed)
PELVIC US TA/TV:homogeneous anteverted uterus,wnl,simple right ovarian cyst w/arterial and venous flow 3.6 x 3.3 x 3.4 cm,normal left ovary,EEC 6.6 mm,right adnexal discomfort during ultrasound,small amount of cul de sac fluid,ovaries appear mobile,Fran discussed results w/pt

## 2017-11-02 NOTE — Progress Notes (Signed)
[facility] Visit  Patient name: [name] MRN [id number]  Date of birth: 04/14/1990  CC & HPI:  Deanna Hughes is a 27 y.o.  female presenting today for STD screen . Had unprotected sex 3 weeks ago w/a guy whom she later found out has herpes. Still w/same partner as before, hasn't had sex w/him since her indiscretion. Wants STD testing, aware that there is not a reliable test for HSV. Also, still having daily RLQ pain that she has had for a few months. Also had some dysuria a few weeks ago that has since improved  Pertinent History Reviewed:  Medical & Surgical Hx:   Past Medical History:  Diagnosis Date  . BV (bacterial vaginosis) 04/02/2015  . Chlamydia    treated 6/6  . Chronic back pain   . Chronic headaches    MIGRAINES  . Contraceptive management 01/25/2013  . Depression   . Encounter for Nexplanon removal 02/27/2015  . Irregular bleeding 06/03/2013  . Miscarriage 03/27/2013  . Nexplanon insertion 04/11/2013   nexplanon inserted 04/11/13 remove 3/32/18  . Right sided abdominal pain   . Scoliosis   . Seasonal allergies 07/03/2012  . Sinus infection 08/27/2013  . Supervision of normal pregnancy 08/27/2015    [facility] at   7+5 weeks  FOB  Dating By  LMP  Pap  GC/CT Initial:                36+wks: Genetic Screen NT/IT:  CF screen  Anatomic [place]  Flu vaccine  Tdap Recommended ~ 28wks Glucose Screen  2 hr GBS  Feed Preference  Contraception  Circumcision  Childbirth Classes  Pediatrician    . Threatened abortion in early pregnancy 03/20/2013  . Vaginal discharge 10/03/2012  . Yeast infection 07/03/2012   Past Surgical History:  Procedure Laterality Date  . INGUINAL HERNIA REPAIR Right 04/19/2017   Procedure: HERNIA REPAIR INGUINAL ADULT;  Surgeon: Robert Bellow, MD;  Location: [facility] ORS;  Service: General;  Laterality: Right;  . WISDOM TOOTH EXTRACTION     Family History  Problem Relation Age of Onset  . Asthma Mother   . Asthma Sister   .  Asthma Brother   . Asthma Daughter   . Diabetes Maternal Grandmother   . Hypertension Maternal Grandmother   . Asthma Maternal Grandmother   . Heart disease Maternal Grandmother   . Stroke Maternal Grandmother   . Hypertension Maternal Grandfather   . Asthma Maternal Grandfather   . Heart disease Maternal Grandfather   . Asthma Son   . Colon cancer Neg Hx     Current Outpatient Medications:  .  acetaminophen (TYLENOL) 500 MG tablet, Take 1,000 mg by mouth 3 (three) times daily as needed for moderate pain or headache. , Disp: , Rfl:  .  etonogestrel (NEXPLANON) 68 MG IMPL implant, 1 each once by Subdermal route., Disp: , Rfl:  .  fluticasone (FLONASE) 50 MCG/ACT nasal spray, Place 2 sprays into both nostrils daily as needed for allergies. , Disp: , Rfl: 2 .  megestrol (MEGACE) 40 MG tablet, 3x5d, 2x5d, then 1 daily to help control vaginal bleeding. Stop taking when bleeding stops., Disp: 45 tablet, Rfl: 1 .  dronabinol (MARINOL) 2.5 MG capsule, Take 1 capsule (2.5 mg total) by mouth 2 (two) times daily before lunch and supper., Disp: 60 capsule, Rfl: 3 Social History: Reviewed -  reports that she has never smoked. She has never used smokeless tobacco.  Review  of Systems:   Constitutional: Negative for fever and chills Eyes: Negative for visual disturbances Respiratory: Negative for shortness of breath, dyspnea Cardiovascular: Negative for chest pain or palpitations  Gastrointestinal: Negative for vomiting, diarrhea and constipation; no abdominal pain Genitourinary: Negative for dysuria and urgency, vaginal irritation or itching Musculoskeletal: Negative for back pain, joint pain, myalgias  Neurological: Negative for dizziness and headaches    Objective Findings:    Physical Examination: Vitals:   11/02/17 1154  BP: 115/72  Pulse: 79   General appearance - well appearing, and in no distress Mental status - alert, oriented to person, place, and time Chest:  Normal  respiratory effort Heart - normal rate and regular rhythm Abdomen:  Soft, nontender Pelvic: Normal appearing discharge, no lesions Musculoskeletal:  Normal range of motion without pain Extremities:  No edema    No results found for this or any previous visit (from the past 24 hour(s)).    Assessment & Plan:  A:   GC/CHL/Trich screen P:  Pelvic US    Return for pelvic US.  Joaquim Lai Cresenzo-Dishmon CNM 11/02/2017 2:19 PM

## 2017-11-03 ENCOUNTER — Ambulatory Visit (INDEPENDENT_AMBULATORY_CARE_PROVIDER_SITE_OTHER): Payer: 59 | Admitting: Obstetrics & Gynecology

## 2017-11-03 ENCOUNTER — Encounter: Payer: Self-pay | Admitting: Obstetrics & Gynecology

## 2017-11-03 VITALS — BP 110/73 | HR 75 | Ht 62.0 in | Wt 167.0 lb

## 2017-11-03 DIAGNOSIS — N83201 Unspecified ovarian cyst, right side: Secondary | ICD-10-CM

## 2017-11-03 MED ORDER — MEGESTROL ACETATE 40 MG PO TABS: ORAL_TABLET | ORAL | 3 refills | Status: DC

## 2017-11-03 NOTE — Progress Notes (Signed)
Follow up appointment for results  Chief Complaint  Patient presents with  . Pre-op Exam    ovarian cyst    Blood pressure 110/73, pulse 75, height 5\' 2"  (1.575 m), weight 167 lb (75.8 kg), not currently breastfeeding.  US Transvaginal Non-ob  Result Date: 11/03/2017 GYNECOLOGIC SONOGRAM Deanna Hughes is a 27 y.o. [id number] LMP 09/26/2017,she is here for a pelvic sonogram for right lower quadrant pain. Uterus                      7.2 x 5.3 x 5.4 cm, Vol 128 ml,homogeneous anteverted uterus,wnl Endometrium          6.6 mm, symmetrical, wnl Right ovary             3.8 x 3.7 x 3.9 cm, simple right ovarian cyst w/arterial and venous flow 3.6 x 3.3 x 3.4 cm Left ovary                3 x 2.7 x 2.1 cm, wnl small amount of cul de sac fluid Technician Comments: PELVIC US TA/TV:homogeneous anteverted uterus,wnl,simple right ovarian cyst w/arterial and venous flow 3.6 x 3.3 x 3.4 cm,normal left ovary,EEC 6.6 mm,right adnexal discomfort during ultrasound,small amount of cul de sac fluid,ovaries appear mobile,Fran discussed results w/pt [employer] 11/02/2017 3:10 PM Clinical Impression and recommendations: I have reviewed the sonogram results above, combined with the patient's current clinical course, below are my impressions and any appropriate recommendations for management based on the sonographic findings. Uterus is normal with normal endometrium(on nexplanon) 4 cm right ovarian cyst is present, simple, no torsion Left ovary is normal Florian Buff 11/03/2017 11:04 AM   US Pelvis (transabdominal Only)  Result Date: 11/03/2017 GYNECOLOGIC SONOGRAM Deanna Hughes is a 27 y.o. [id number] LMP 09/26/2017,she is here for a pelvic sonogram for right lower quadrant pain. Uterus                      7.2 x 5.3 x 5.4 cm, Vol 128 ml,homogeneous anteverted uterus,wnl Endometrium          6.6 mm, symmetrical, wnl Right ovary             3.8 x 3.7 x 3.9 cm, simple right ovarian cyst w/arterial and venous flow 3.6 x 3.3 x  3.4 cm Left ovary                3 x 2.7 x 2.1 cm, wnl small amount of cul de sac fluid Technician Comments: PELVIC US TA/TV:homogeneous anteverted uterus,wnl,simple right ovarian cyst w/arterial and venous flow 3.6 x 3.3 x 3.4 cm,normal left ovary,EEC 6.6 mm,right adnexal discomfort during ultrasound,small amount of cul de sac fluid,ovaries appear mobile,Fran discussed results w/pt [employer] 11/02/2017 3:10 PM Clinical Impression and recommendations: I have reviewed the sonogram results above, combined with the patient's current clinical course, below are my impressions and any appropriate recommendations for management based on the sonographic findings. Uterus is normal with normal endometrium(on nexplanon) 4 cm right ovarian cyst is present, simple, no torsion Left ovary is normal Florian Buff 11/03/2017 11:04 AM   US Pelvic Doppler Limited  Result Date: 11/03/2017 GYNECOLOGIC SONOGRAM Deanna Hughes is a 27 y.o. [id number] LMP 09/26/2017,she is here for a pelvic sonogram for right lower quadrant pain. Uterus                      7.2 x 5.3 x  5.4 cm, Vol 128 ml,homogeneous anteverted uterus,wnl Endometrium          6.6 mm, symmetrical, wnl Right ovary             3.8 x 3.7 x 3.9 cm, simple right ovarian cyst w/arterial and venous flow 3.6 x 3.3 x 3.4 cm Left ovary                3 x 2.7 x 2.1 cm, wnl small amount of cul de sac fluid Technician Comments: PELVIC US TA/TV:homogeneous anteverted uterus,wnl,simple right ovarian cyst w/arterial and venous flow 3.6 x 3.3 x 3.4 cm,normal left ovary,EEC 6.6 mm,right adnexal discomfort during ultrasound,small amount of cul de sac fluid,ovaries appear mobile,Fran discussed results w/pt [employer] 11/02/2017 3:10 PM Clinical Impression and recommendations: I have reviewed the sonogram results above, combined with the patient's current clinical course, below are my impressions and any appropriate recommendations for management based on the sonographic findings. Uterus  is normal with normal endometrium(on nexplanon) 4 cm right ovarian cyst is present, simple, no torsion Left ovary is normal Florian Buff 11/03/2017 11:04 AM      MEDS ordered this encounter: Meds ordered this encounter  Medications  . megestrol (MEGACE) 40 MG tablet    Sig: 3 tablets a day for 5 days, 2 tablets a day for 5 days then 1 tablet daily    Dispense:  45 tablet    Refill:  3    Orders for this encounter: Orders Placed This Encounter  Procedures  . US PELVIS TRANSVANGINAL NON-OB (TV ONLY)  . US PELVIS TRANSVANGINAL NON-OB (TV ONLY)    Impression: Cyst of right ovary - Plan: US PELVIS TRANSVANGINAL NON-OB (TV ONLY), US PELVIS TRANSVANGINAL NON-OB (TV ONLY)    Plan: >suppression with megestrol for 6 weeks, then re eval with sonogram at that time >if significant increase in pai in the meantime then contact the office  Follow Up: Return in about 6 weeks (around 12/15/2017) for GYN sono, Follow up, with Dr Elonda Husky.       Face to face time:  15 minutes  Greater than 50% of the visit time was spent in counseling and coordination of care with the patient.  The summary and outline of the counseling and care coordination is summarized in the note above.   All questions were answered.  Past Medical History:  Diagnosis Date  . BV (bacterial vaginosis) 04/02/2015  . Chlamydia    treated 6/6  . Chronic back pain   . Chronic headaches    MIGRAINES  . Contraceptive management 01/25/2013  . Depression   . Encounter for Nexplanon removal 02/27/2015  . Irregular bleeding 06/03/2013  . Miscarriage 03/27/2013  . Nexplanon insertion 04/11/2013   nexplanon inserted 04/11/13 remove 3/32/18  . Right sided abdominal pain   . Scoliosis   . Seasonal allergies 07/03/2012  . Sinus infection 08/27/2013  . Supervision of normal pregnancy 08/27/2015    [facility] at   7+5 weeks  FOB  Dating By  LMP  Pap  GC/CT Initial:                36+wks: Genetic Screen NT/IT:  CF  screen  Anatomic [place]  Flu vaccine  Tdap Recommended ~ 28wks Glucose Screen  2 hr GBS  Feed Preference  Contraception  Circumcision  Childbirth Classes  Pediatrician    . Threatened abortion in early pregnancy 03/20/2013  . Vaginal discharge 10/03/2012  . Yeast infection 07/03/2012  Past Surgical History:  Procedure Laterality Date  . INGUINAL HERNIA REPAIR Right 04/19/2017   Procedure: HERNIA REPAIR INGUINAL ADULT;  Surgeon: Robert Bellow, MD;  Location: [facility] ORS;  Service: General;  Laterality: Right;  . WISDOM TOOTH EXTRACTION      OB History    Gravida  5   Para  3   Term  3   Preterm  0   AB  2   Living  3     SAB  2   TAB  0   Ectopic  0   Multiple  0   Live Births  3        Obstetric Comments  1st Menstrual Cycle:  15  1st Pregnancy:  18         Allergies  Allergen Reactions  . Aspirin Shortness Of Breath and Other (See Comments)    Heart beats fast  . Advil [Ibuprofen] Other (See Comments)    Breakout, heart palpitations and trouble breathing. Cant take Advil but can take Ibuprofen    Social History   Socioeconomic History  . Marital status: Single    Spouse name: Not on file  . Number of children: Not on file  . Years of education: Not on file  . Highest education level: Not on file  Occupational History  . Not on file  Social Needs  . Financial resource strain: Not on file  . Food insecurity:    Worry: Not on file    Inability: Not on file  . Transportation needs:    Medical: Not on file    Non-medical: Not on file  Tobacco Use  . Smoking status: Never Smoker  . Smokeless tobacco: Never Used  Substance and Sexual Activity  . Alcohol use: No  . Drug use: No  . Sexual activity: Yes    Birth control/protection: Implant  Lifestyle  . Physical activity:    Days per week: Not on file    Minutes per session: Not on file  . Stress: Not on file  Relationships  . Social connections:    Talks on phone: Not on file    Gets  together: Not on file    Attends religious service: Not on file    Active member of club or organization: Not on file    Attends meetings of clubs or organizations: Not on file    Relationship status: Not on file  Other Topics Concern  . Not on file  Social History Narrative  . Not on file    Family History  Problem Relation Age of Onset  . Asthma Mother   . Asthma Sister   . Asthma Brother   . Asthma Daughter   . Diabetes Maternal Grandmother   . Hypertension Maternal Grandmother   . Asthma Maternal Grandmother   . Heart disease Maternal Grandmother   . Stroke Maternal Grandmother   . Hypertension Maternal Grandfather   . Asthma Maternal Grandfather   . Heart disease Maternal Grandfather   . Asthma Son   . Colon cancer Neg Hx

## 2017-11-04 LAB — URINE CULTURE: ORGANISM ID, BACTERIA: NO GROWTH

## 2017-11-06 LAB — NUSWAB VAGINITIS PLUS (VG+)
Candida albicans, NAA: NEGATIVE
Candida glabrata, NAA: NEGATIVE
Chlamydia trachomatis, NAA: NEGATIVE
Neisseria gonorrhoeae, NAA: NEGATIVE
TRICH VAG BY NAA: NEGATIVE

## 2017-12-08 ENCOUNTER — Ambulatory Visit (INDEPENDENT_AMBULATORY_CARE_PROVIDER_SITE_OTHER): Payer: 59

## 2017-12-08 ENCOUNTER — Ambulatory Visit (INDEPENDENT_AMBULATORY_CARE_PROVIDER_SITE_OTHER): Payer: 59 | Admitting: Obstetrics & Gynecology

## 2017-12-08 ENCOUNTER — Encounter: Payer: Self-pay | Admitting: Obstetrics & Gynecology

## 2017-12-08 VITALS — BP 102/63 | HR 93 | Ht 62.0 in | Wt 165.0 lb

## 2017-12-08 DIAGNOSIS — N926 Irregular menstruation, unspecified: Secondary | ICD-10-CM | POA: Diagnosis not present

## 2017-12-08 DIAGNOSIS — N83201 Unspecified ovarian cyst, right side: Secondary | ICD-10-CM

## 2017-12-08 DIAGNOSIS — G8929 Other chronic pain: Secondary | ICD-10-CM

## 2017-12-08 DIAGNOSIS — R1031 Right lower quadrant pain: Secondary | ICD-10-CM | POA: Diagnosis not present

## 2017-12-08 NOTE — Progress Notes (Signed)
PELVIC US TA/TV: homogeneous anteverted uterus,wnl,EEC 4.5 mm,normal ovaries bilat,ovaries appear mobile,no free fluid,no pain during ultrasound

## 2017-12-08 NOTE — Progress Notes (Signed)
Follow up appointment for results  Chief Complaint  Patient presents with  . Follow-up    gyn u/s    Blood pressure 102/63, pulse 93, height 5\' 2"  (1.575 m), weight 165 lb (74.8 kg), not currently breastfeeding.  US Pelvis Transvanginal Non-ob (tv Only)  Result Date: 12/08/2017 GYNECOLOGIC SONOGRAM Deanna Hughes is a 27 y.o. [id number] LMP 11/02/2017 for a pelvic sonogram for f/u right ovarian cyst. Uterus                      6.6 x 5.2 x 5.4 cm,vol 96 ml,  homogeneous anteverted uterus,wnl Endometrium          4.5 mm, symmetrical, wnl Right ovary             2.9 x 2.8 x 2.8 cm, wnl Left ovary                2.5 x 1.4 x 2 cm, wnl No free fluid Technician Comments: PELVIC US TA/TV: homogeneous anteverted uterus,wnl,EEC 4.5 mm,normal ovaries bilat,ovaries appear mobile,no free fluid,no pain during ultrasound [employer] 12/08/2017 11:03 AM Clinical Impression and recommendations: I have reviewed the sonogram results above, combined with the patient's current clinical course, below are my impressions and any appropriate recommendations for management based on the sonographic findings. This sonogram is a follow up of a scan on 11/02/2017 for RLQ pain and revealed a 3.8 cm right ovarian cyst Uterus and endometrium are normal with resolved ovarian cyst and normal ovaries No follow up scan is required Florian Buff 12/08/2017 11:08 AM   US Pelvis Transvanginal Non-ob (tv Only)  Result Date: 12/08/2017 GYNECOLOGIC SONOGRAM Deanna Hughes is a 27 y.o. [id number] LMP 11/02/2017 for a pelvic sonogram for f/u right ovarian cyst. Uterus                      6.6 x 5.2 x 5.4 cm,vol 96 ml,  homogeneous anteverted uterus,wnl Endometrium          4.5 mm, symmetrical, wnl Right ovary             2.9 x 2.8 x 2.8 cm, wnl Left ovary                2.5 x 1.4 x 2 cm, wnl No free fluid Technician Comments: PELVIC US TA/TV: homogeneous anteverted uterus,wnl,EEC 4.5 mm,normal ovaries bilat,ovaries appear mobile,no free  fluid,no pain during ultrasound [employer] 12/08/2017 11:03 AM Clinical Impression and recommendations: I have reviewed the sonogram results above, combined with the patient's current clinical course, below are my impressions and any appropriate recommendations for management based on the sonographic findings. This sonogram is a follow up of a scan on 11/02/2017 for RLQ pain and revealed a 3.8 cm right ovarian cyst Uterus and endometrium are normal with resolved ovarian cyst and normal ovaries No follow up scan is required Florian Buff 12/08/2017 11:08 AM      MEDS ordered this encounter: No orders of the defined types were placed in this encounter.   Orders for this encounter: No orders of the defined types were placed in this encounter.   Impression: Cyst of right ovary  Chronic RLQ pain  Irregular bleeding    Plan: >pt desires to stay on megestrol due to her irregular bleeding profile, the right ovarian cyst has resolved  Follow Up: Return if symptoms worsen or fail to improve.       Face to face  time:  10 minutes  Greater than 50% of the visit time was spent in counseling and coordination of care with the patient.  The summary and outline of the counseling and care coordination is summarized in the note above.   All questions were answered.  Past Medical History:  Diagnosis Date  . BV (bacterial vaginosis) 04/02/2015  . Chlamydia    treated 6/6  . Chronic back pain   . Chronic headaches    MIGRAINES  . Contraceptive management 01/25/2013  . Depression   . Encounter for Nexplanon removal 02/27/2015  . Irregular bleeding 06/03/2013  . Miscarriage 03/27/2013  . Nexplanon insertion 04/11/2013   nexplanon inserted 04/11/13 remove 3/32/18  . Right sided abdominal pain   . Scoliosis   . Seasonal allergies 07/03/2012  . Sinus infection 08/27/2013  . Supervision of normal pregnancy 08/27/2015    [facility] at   7+5 weeks  FOB  Dating By  LMP  Pap   GC/CT Initial:                36+wks: Genetic Screen NT/IT:  CF screen  Anatomic [place]  Flu vaccine  Tdap Recommended ~ 28wks Glucose Screen  2 hr GBS  Feed Preference  Contraception  Circumcision  Childbirth Classes  Pediatrician    . Threatened abortion in early pregnancy 03/20/2013  . Vaginal discharge 10/03/2012  . Yeast infection 07/03/2012    Past Surgical History:  Procedure Laterality Date  . INGUINAL HERNIA REPAIR Right 04/19/2017   Procedure: HERNIA REPAIR INGUINAL ADULT;  Surgeon: Robert Bellow, MD;  Location: [facility] ORS;  Service: General;  Laterality: Right;  . WISDOM TOOTH EXTRACTION      OB History    Gravida  5   Para  3   Term  3   Preterm  0   AB  2   Living  3     SAB  2   TAB  0   Ectopic  0   Multiple  0   Live Births  3        Obstetric Comments  1st Menstrual Cycle:  15  1st Pregnancy:  18         Allergies  Allergen Reactions  . Aspirin Shortness Of Breath and Other (See Comments)    Heart beats fast  . Advil [Ibuprofen] Other (See Comments)    Breakout, heart palpitations and trouble breathing. Cant take Advil but can take Ibuprofen    Social History   Socioeconomic History  . Marital status: Single    Spouse name: Not on file  . Number of children: Not on file  . Years of education: Not on file  . Highest education level: Not on file  Occupational History  . Not on file  Social Needs  . Financial resource strain: Not on file  . Food insecurity:    Worry: Not on file    Inability: Not on file  . Transportation needs:    Medical: Not on file    Non-medical: Not on file  Tobacco Use  . Smoking status: Never Smoker  . Smokeless tobacco: Never Used  Substance and Sexual Activity  . Alcohol use: No  . Drug use: No  . Sexual activity: Yes    Birth control/protection: Implant  Lifestyle  . Physical activity:    Days per week: Not on file    Minutes per session: Not on file  . Stress: Not on file  Relationships  .  Social connections:    Talks on phone: Not on file    Gets together: Not on file    Attends religious service: Not on file    Active member of club or organization: Not on file    Attends meetings of clubs or organizations: Not on file    Relationship status: Not on file  Other Topics Concern  . Not on file  Social History Narrative  . Not on file    Family History  Problem Relation Age of Onset  . Asthma Mother   . Asthma Sister   . Asthma Brother   . Asthma Daughter   . Diabetes Maternal Grandmother   . Hypertension Maternal Grandmother   . Asthma Maternal Grandmother   . Heart disease Maternal Grandmother   . Stroke Maternal Grandmother   . Hypertension Maternal Grandfather   . Asthma Maternal Grandfather   . Heart disease Maternal Grandfather   . Asthma Son   . Colon cancer Neg Hx

## 2018-01-21 ENCOUNTER — Other Ambulatory Visit: Payer: Self-pay

## 2018-01-21 ENCOUNTER — Encounter (HOSPITAL_COMMUNITY): Payer: Self-pay | Admitting: *Deleted

## 2018-01-21 ENCOUNTER — Emergency Department (HOSPITAL_COMMUNITY)
Admission: EM | Admit: 2018-01-21 | Discharge: 2018-01-22 | Disposition: A | Discharge status: Home / Self Care | Payer: 59 | Attending: Emergency Medicine | Admitting: Emergency Medicine

## 2018-01-21 ENCOUNTER — Emergency Department (HOSPITAL_COMMUNITY): Payer: 59

## 2018-01-21 DIAGNOSIS — Y999 Unspecified external cause status: Secondary | ICD-10-CM | POA: Insufficient documentation

## 2018-01-21 DIAGNOSIS — W19XXXA Unspecified fall, initial encounter: Secondary | ICD-10-CM

## 2018-01-21 DIAGNOSIS — S299XXA Unspecified injury of thorax, initial encounter: Secondary | ICD-10-CM | POA: Diagnosis not present

## 2018-01-21 DIAGNOSIS — S298XXA Other specified injuries of thorax, initial encounter: Secondary | ICD-10-CM | POA: Insufficient documentation

## 2018-01-21 DIAGNOSIS — Z79899 Other long term (current) drug therapy: Secondary | ICD-10-CM | POA: Diagnosis not present

## 2018-01-21 DIAGNOSIS — S3991XA Unspecified injury of abdomen, initial encounter: Secondary | ICD-10-CM | POA: Insufficient documentation

## 2018-01-21 DIAGNOSIS — Y929 Unspecified place or not applicable: Secondary | ICD-10-CM | POA: Diagnosis not present

## 2018-01-21 DIAGNOSIS — Y939 Activity, unspecified: Secondary | ICD-10-CM | POA: Diagnosis not present

## 2018-01-21 DIAGNOSIS — W109XXA Fall (on) (from) unspecified stairs and steps, initial encounter: Secondary | ICD-10-CM | POA: Insufficient documentation

## 2018-01-21 DIAGNOSIS — R0781 Pleurodynia: Secondary | ICD-10-CM | POA: Diagnosis not present

## 2018-01-21 MED ORDER — HYDROCODONE-ACETAMINOPHEN 5-325 MG PO TABS
2.0000 | ORAL_TABLET | Freq: Once | ORAL | Status: AC
  Administered 2018-01-22: 2 via ORAL
  Filled 2018-01-21: qty 2

## 2018-01-21 NOTE — ED Triage Notes (Signed)
Pt c/o falling down steps yesterday while trying to catch her child, pt states that she has Pain to right back area that radiates to right abd area,

## 2018-01-22 DIAGNOSIS — R0781 Pleurodynia: Secondary | ICD-10-CM | POA: Diagnosis not present

## 2018-01-22 DIAGNOSIS — Z79899 Other long term (current) drug therapy: Secondary | ICD-10-CM | POA: Diagnosis not present

## 2018-01-22 DIAGNOSIS — S299XXA Unspecified injury of thorax, initial encounter: Secondary | ICD-10-CM | POA: Diagnosis not present

## 2018-01-22 DIAGNOSIS — S3991XA Unspecified injury of abdomen, initial encounter: Secondary | ICD-10-CM | POA: Diagnosis not present

## 2018-01-22 DIAGNOSIS — S298XXA Other specified injuries of thorax, initial encounter: Secondary | ICD-10-CM | POA: Diagnosis not present

## 2018-01-22 MED ORDER — HYDROCODONE-ACETAMINOPHEN 5-325 MG PO TABS: 1.0000 | ORAL_TABLET | Freq: Four times a day (QID) | ORAL | 0 refills | Status: DC | PRN

## 2018-01-22 NOTE — ED Provider Notes (Signed)
[facility] EMERGENCY DEPARTMENT Provider Note   CSN: [id number] Arrival date & time: 01/21/18  2105     History   Chief Complaint Chief Complaint  Patient presents with  . Fall    HPI Deanna Hughes is a 28 y.o. female.  The history is provided by the patient.  Fall  This is a new problem. The current episode started 12 to 24 hours ago. The problem has been gradually worsening. Associated symptoms include abdominal pain. Pertinent negatives include no headaches and no shortness of breath. Associated symptoms comments: Chest wall pain. The symptoms are aggravated by walking. The symptoms are relieved by rest.   Patient presents after mechanical fall.  She reports she fell down stairs yesterday while trying to help her child.  She does report that she may have fallen head over heels.  Denies LOC.  No headache or vomiting at this time.  No lightheadedness.  Most of the pain is located in her right shoulder, right chest, right abdomen. Past Medical History:  Diagnosis Date  . BV (bacterial vaginosis) 04/02/2015  . Chlamydia    treated 6/6  . Chronic back pain   . Chronic headaches    MIGRAINES  . Contraceptive management 01/25/2013  . Depression   . Encounter for Nexplanon removal 02/27/2015  . Irregular bleeding 06/03/2013  . Miscarriage 03/27/2013  . Nexplanon insertion 04/11/2013   nexplanon inserted 04/11/13 remove 3/32/18  . Right sided abdominal pain   . Scoliosis   . Seasonal allergies 07/03/2012  . Sinus infection 08/27/2013  . Supervision of normal pregnancy 08/27/2015    [facility] at   7+5 weeks  FOB  Dating By  LMP  Pap  GC/CT Initial:                36+wks: Genetic Screen NT/IT:  CF screen  Anatomic [place]  Flu vaccine  Tdap Recommended ~ 28wks Glucose Screen  2 hr GBS  Feed Preference  Contraception  Circumcision  Childbirth Classes  Pediatrician    . Threatened abortion in early pregnancy 03/20/2013  . Vaginal discharge 10/03/2012  . Yeast infection  07/03/2012    Patient Active Problem List   Diagnosis Date Noted  . Right inguinal hernia 12/14/2015  . BV (bacterial vaginosis) 04/02/2015  . Sinus infection 08/27/2013  . Irregular bleeding 06/03/2013  . Seasonal allergies 07/03/2012  . Defecation urgency     Past Surgical History:  Procedure Laterality Date  . INGUINAL HERNIA REPAIR Right 04/19/2017   Procedure: HERNIA REPAIR INGUINAL ADULT;  Surgeon: Robert Bellow, MD;  Location: [facility] ORS;  Service: General;  Laterality: Right;  . WISDOM TOOTH EXTRACTION       OB History    Gravida  5   Para  3   Term  3   Preterm  0   AB  2   Living  3     SAB  2   TAB  0   Ectopic  0   Multiple  0   Live Births  3        Obstetric Comments  1st Menstrual Cycle:  15  1st Pregnancy:  18          Home Medications    Prior to Admission medications   Medication Sig Start Date End Date Taking? Authorizing Provider  acetaminophen (TYLENOL) 500 MG tablet Take 1,000 mg by mouth 3 (three) times daily as needed for moderate pain or headache.  [provider]  dronabinol (MARINOL) 2.5 MG capsule Take 1 capsule (2.5 mg total) by mouth 2 (two) times daily before lunch and supper. 11/02/17   Cresenzo-Dishmon, Joaquim Lai, CNM  etonogestrel (NEXPLANON) 68 MG IMPL implant 1 each once by Subdermal route.    [provider]  fluticasone (FLONASE) 50 MCG/ACT nasal spray Place 2 sprays into both nostrils daily as needed for allergies.  12/19/16   [provider]  megestrol (MEGACE) 40 MG tablet 3x5d, 2x5d, then 1 daily to help control vaginal bleeding. Stop taking when bleeding stops. Patient not taking: Reported on 12/08/2017 09/28/17   Roma Schanz, CNM  megestrol (MEGACE) 40 MG tablet 3 tablets a day for 5 days, 2 tablets a day for 5 days then 1 tablet daily 11/03/17   Florian Buff, MD    Family History Family History  Problem Relation Age of Onset  . Asthma Mother   . Asthma Sister   .  Asthma Brother   . Asthma Daughter   . Diabetes Maternal Grandmother   . Hypertension Maternal Grandmother   . Asthma Maternal Grandmother   . Heart disease Maternal Grandmother   . Stroke Maternal Grandmother   . Hypertension Maternal Grandfather   . Asthma Maternal Grandfather   . Heart disease Maternal Grandfather   . Asthma Son   . Colon cancer Neg Hx     Social History Social History   Tobacco Use  . Smoking status: Never Smoker  . Smokeless tobacco: Never Used  Substance Use Topics  . Alcohol use: No  . Drug use: No     Allergies   Aspirin and Advil [ibuprofen]   Review of Systems Review of Systems  Constitutional: Negative for fever.  Respiratory: Negative for shortness of breath.   Gastrointestinal: Positive for abdominal pain. Negative for vomiting.  Musculoskeletal: Positive for arthralgias. Negative for back pain and neck pain.  Neurological: Negative for syncope, light-headedness and headaches.  All other systems reviewed and are negative.    Physical Exam Updated Vital Signs BP 119/78 (BP Location: Right Arm)   Pulse 90   Temp 98.7 F (37.1 C) (Oral)   Resp 18   Ht 1.575 m (_0 )   Wt 72.6 kg   LMP 01/05/2018   SpO2 100%   BMI 29.26 kg/m   Physical Exam CONSTITUTIONAL: Well developed/well nourished HEAD: Normocephalic/atraumatic, no signs of trauma EYES: EOMI/PERRL ENMT: Mucous membranes moist, no signs of trauma NECK: supple no meningeal signs SPINE/BACK:entire spine nontender, nexus criteria met, no bruising/crepitance/stepoffs noted to spine CV: S1/S2 noted, no murmurs/rubs/gallops noted LUNGS: Lungs are clear to auscultation bilaterally, no apparent distress Chest-diffuse tenderness to right chest wall anterior and posterior surfaces.  No crepitus or bruising ABDOMEN: soft, mild diffuse tenderness to right upper and right lower quadrant., no rebound or guarding, bowel sounds noted throughout abdomen No significant bruising  noted GU:no cva tenderness NEURO: Pt is awake/alert/appropriate, moves all extremitiesx4.  No facial droop.   GCS 15 EXTREMITIES: pulses normal/equal, full ROM, tenderness to palpation range of motion of right shoulder but no deformities.  All other extremities/joints palpated/ranged and nontender SKIN: warm, color normal PSYCH: no abnormalities of mood noted, alert and oriented to situation   ED Treatments / Results  Labs (all labs ordered are listed, but only abnormal results are displayed) Labs Reviewed - No data to display  EKG None  Radiology Dg Ribs Unilateral W/chest Right  Result Date: 01/22/2018 CLINICAL DATA:  Patient fell down steps yesterday while  trying to catch her child. Pain in the back and right abdomen. EXAM: RIGHT RIBS AND CHEST - 3+ VIEW COMPARISON:  CXR 03/11/2015 FINDINGS: No fracture or other bone lesions are seen involving the ribs. 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 There is no evidence of pneumothorax or pleural effusion. Both lungs are clear. Heart size and mediastinal contours are within normal limits. IMPRESSION: No acute displaced rib fracture or pulmonary contusions. Stable dextroconvex curvature the mid to lower thoracic spine. Electronically Signed   By: Ashley Royalty M.D.   On: 01/22/2018 00:16    Procedures Procedures (including critical care time)  Medications Ordered in ED Medications  HYDROcodone-acetaminophen (NORCO/VICODIN) 5-325 MG per tablet 2 tablet (2 tablets Oral Given 01/22/18 0013)     Initial Impression / Assessment and Plan / ED Course  I have reviewed the triage vital signs and the nursing notes.  Pertinent  imaging results that were available during my care of the patient were reviewed by me and considered in my medical decision making (see chart for details).     12:06 AM Patient with mechanical fall over 24 hours ago.  She has significant chest wall tenderness, will proceed with x-ray imaging.  She  does have abdominal tenderness, but no bruising, no rebound, no guarding.  Denies vomiting or lightheadedness.  At this time my suspicion for acute abdominal trauma is low. 1:19 AM Imaging negative.  On repeat exam she has no focal abdominal tenderness. I Do not feel further imaging is required at this time.  Will discharge home.  We discussed return precautions Final Clinical Impressions(s) / ED Diagnoses   Final diagnoses:  Fall, initial encounter  Blunt trauma to chest, initial encounter  Blunt abdominal trauma, initial encounter    ED Discharge Orders         Ordered    HYDROcodone-acetaminophen (NORCO/VICODIN) 5-325 MG tablet  Every 6 hours PRN     01/22/18 0106           Ripley Fraise, MD 01/22/18 0120

## 2018-01-24 ENCOUNTER — Encounter: Payer: Self-pay | Admitting: Women's Health

## 2018-01-24 ENCOUNTER — Ambulatory Visit (INDEPENDENT_AMBULATORY_CARE_PROVIDER_SITE_OTHER): Payer: 59 | Admitting: Women's Health

## 2018-01-24 VITALS — BP 120/69 | HR 91 | Ht 62.0 in | Wt 165.0 lb

## 2018-01-24 DIAGNOSIS — Z113 Encounter for screening for infections with a predominantly sexual mode of transmission: Secondary | ICD-10-CM | POA: Diagnosis not present

## 2018-01-24 DIAGNOSIS — N760 Acute vaginitis: Secondary | ICD-10-CM | POA: Diagnosis not present

## 2018-01-24 DIAGNOSIS — B9689 Other specified bacterial agents as the cause of diseases classified elsewhere: Secondary | ICD-10-CM

## 2018-01-24 DIAGNOSIS — N898 Other specified noninflammatory disorders of vagina: Secondary | ICD-10-CM

## 2018-01-24 DIAGNOSIS — F410 Panic disorder [episodic paroxysmal anxiety] without agoraphobia: Secondary | ICD-10-CM

## 2018-01-24 DIAGNOSIS — F419 Anxiety disorder, unspecified: Secondary | ICD-10-CM

## 2018-01-24 LAB — POCT WET PREP (WET MOUNT)
CLUE CELLS WET PREP WHIFF POC: POSITIVE
TRICHOMONAS WET PREP HPF POC: ABSENT

## 2018-01-24 MED ORDER — METRONIDAZOLE 500 MG PO TABS: 500.0000 mg | ORAL_TABLET | Freq: Two times a day (BID) | ORAL | 0 refills | Status: DC

## 2018-01-24 MED ORDER — CITALOPRAM HYDROBROMIDE 20 MG PO TABS: 20.0000 mg | ORAL_TABLET | Freq: Every day | ORAL | 6 refills | Status: DC

## 2018-01-24 NOTE — Progress Notes (Signed)
GYN VISIT Patient name: Deanna Hughes MRN [id number]  Date of birth: 1990-12-02 Chief Complaint:   vaginal discharge with odor  History of Present Illness:   Deanna Hughes is a 28 y.o. [phone] African American female being seen today for report of vaginal d/c w/ odor x 1wk, some itching/irritation. Also reports thinks nexplanon has caused anxiety/panic attacks, were bad in beginning, then went away, now that her brother passed they have come back bad again. Interested in trying meds, declines therapy. Does not want to remove nexplanon.   Depression screen [facility] 2/9 01/24/2018 09/28/2017  Decreased Interest 0 0  Down, Depressed, Hopeless 0 0  PHQ - 2 Score 0 0        Patient's last menstrual period was 01/05/2018. The current method of family planning is Nexplanon inserted 06/30/16. Last pap 09/28/17. Results were:  normal Review of Systems:   Pertinent items are noted in HPI Denies fever/chills, dizziness, headaches, visual disturbances, fatigue, shortness of breath, chest pain, abdominal pain, vomiting, abnormal vaginal discharge/itching/odor/irritation, problems with periods, bowel movements, urination, or intercourse unless otherwise stated above.  Pertinent History Reviewed:  Reviewed past medical,surgical, social, obstetrical and family history.  Reviewed problem list, medications and allergies. Physical Assessment:   Vitals:   01/24/18 0956  BP: 120/69  Pulse: 91  Weight: 165 lb (74.8 kg)  Height: 5\' 2"  (1.575 m)  Body mass index is 30.18 kg/m.       Physical Examination:   General appearance: alert, well appearing, and in no distress  Mental status: alert, oriented to person, place, and time  Skin: warm & dry   Cardiovascular: normal heart rate noted  Respiratory: normal respiratory effort, no distress  Abdomen: soft, non-tender   Pelvic: VULVA: normal appearing vulva with no masses, tenderness or lesions, VAGINA: normal appearing vagina with normal color and mod amt  thin white malodorous discharge, no lesions, CERVIX: normal appearing cervix without discharge or lesions  Extremities: no edema   Results for orders placed or performed in visit on 01/24/18 (from the past 24 hour(s))  POCT Wet Prep [facility])   Collection Time: 01/24/18 10:33 AM  Result Value Ref Range   Source Wet Prep POC vaginal    WBC, Wet Prep HPF POC many    Bacteria Wet Prep HPF POC Few Few   BACTERIA WET PREP MORPHOLOGY POC     Clue Cells Wet Prep HPF POC Moderate (A) None   Clue Cells Wet Prep Whiff POC Positive Whiff    Yeast Wet Prep HPF POC None None   KOH Wet Prep POC     Trichomonas Wet Prep HPF POC Absent Absent    Assessment & Plan:  1) BV> Rx metronidazole 500mg  BID x 7d for BV, no sex or etoh while taking   2) Anxiety/panic attacks> rx celexa 20mg  daily, understands can take few weeks to notice improvement, declines counseling/therapy, f/u 4wks  Meds:  Meds ordered this encounter  Medications  . citalopram (CELEXA) 20 MG tablet    Sig: Take 1 tablet (20 mg total) by mouth daily.    Dispense:  30 tablet    Refill:  6    Order Specific Question:   Supervising Provider    Answer:   Elonda Husky, LUTHER H [[id number]]  . metroNIDAZOLE (FLAGYL) 500 MG tablet    Sig: Take 1 tablet (500 mg total) by mouth 2 (two) times daily.    Dispense:  14 tablet    Refill:  0  Order Specific Question:   Supervising Provider    Answer:   Florian Buff [[id number]]    Orders Placed This Encounter  Procedures  . GC/Chlamydia Probe Amp  . POCT Wet Prep [facility])    Return in about 4 weeks (around 02/21/2018) for F/U.  [place], [facility] 01/24/2018 10:35 AM

## 2018-01-24 NOTE — Patient Instructions (Signed)

## 2018-01-26 ENCOUNTER — Encounter (HOSPITAL_COMMUNITY): Payer: Self-pay | Admitting: Emergency Medicine

## 2018-01-26 ENCOUNTER — Emergency Department (HOSPITAL_COMMUNITY)
Admission: EM | Admit: 2018-01-26 | Discharge: 2018-01-27 | Disposition: A | Discharge status: Home / Self Care | Payer: 59 | Attending: Emergency Medicine | Admitting: Emergency Medicine

## 2018-01-26 ENCOUNTER — Other Ambulatory Visit: Payer: Self-pay

## 2018-01-26 ENCOUNTER — Emergency Department (HOSPITAL_COMMUNITY): Payer: 59

## 2018-01-26 DIAGNOSIS — R1031 Right lower quadrant pain: Secondary | ICD-10-CM | POA: Diagnosis not present

## 2018-01-26 DIAGNOSIS — Z79899 Other long term (current) drug therapy: Secondary | ICD-10-CM | POA: Insufficient documentation

## 2018-01-26 DIAGNOSIS — N83291 Other ovarian cyst, right side: Secondary | ICD-10-CM | POA: Insufficient documentation

## 2018-01-26 DIAGNOSIS — N949 Unspecified condition associated with female genital organs and menstrual cycle: Secondary | ICD-10-CM

## 2018-01-26 DIAGNOSIS — N838 Other noninflammatory disorders of ovary, fallopian tube and broad ligament: Secondary | ICD-10-CM | POA: Diagnosis not present

## 2018-01-26 LAB — CBC
HEMATOCRIT: 38.4 % (ref 36.0–46.0)
Hemoglobin: 12.2 g/dL (ref 12.0–15.0)
MCH: 28.8 pg (ref 26.0–34.0)
MCHC: 31.8 g/dL (ref 30.0–36.0)
MCV: 90.6 fL (ref 80.0–100.0)
Platelets: 188 10*3/uL (ref 150–400)
RBC: 4.24 MIL/uL (ref 3.87–5.11)
RDW: 12.4 % (ref 11.5–15.5)
WBC: 9.1 10*3/uL (ref 4.0–10.5)
nRBC: 0 % (ref 0.0–0.2)

## 2018-01-26 LAB — COMPREHENSIVE METABOLIC PANEL
ALT: 14 U/L (ref 0–44)
ANION GAP: 6 (ref 5–15)
AST: 16 U/L (ref 15–41)
Albumin: 4.1 g/dL (ref 3.5–5.0)
Alkaline Phosphatase: 63 U/L (ref 38–126)
BUN: 11 mg/dL (ref 6–20)
CO2: 26 mmol/L (ref 22–32)
Calcium: 9.1 mg/dL (ref 8.9–10.3)
Chloride: 106 mmol/L (ref 98–111)
Creatinine, Ser: 0.6 mg/dL (ref 0.44–1.00)
GFR calc non Af Amer: >60 mL/min (ref >60)
Glucose, Bld: 90 mg/dL (ref 70–99)
Potassium: 3.6 mmol/L (ref 3.5–5.1)
Sodium: 138 mmol/L (ref 135–145)
Total Bilirubin: 0.6 mg/dL (ref 0.3–1.2)
Total Protein: 6.8 g/dL (ref 6.5–8.1)

## 2018-01-26 LAB — GC/CHLAMYDIA PROBE AMP
CHLAMYDIA, DNA PROBE: NEGATIVE
Neisseria gonorrhoeae by PCR: NEGATIVE

## 2018-01-26 MED ORDER — IOPAMIDOL (ISOVUE-300) INJECTION 61%
100.0000 mL | Freq: Once | INTRAVENOUS | Status: AC | PRN
  Administered 2018-01-27: 100 mL via INTRAVENOUS

## 2018-01-26 MED ORDER — MORPHINE SULFATE (PF) 4 MG/ML IV SOLN
4.0000 mg | Freq: Once | INTRAVENOUS | Status: AC
  Administered 2018-01-26: 4 mg via INTRAVENOUS
  Filled 2018-01-26: qty 1

## 2018-01-26 MED ORDER — ONDANSETRON HCL 4 MG/2ML IJ SOLN
4.0000 mg | Freq: Once | INTRAMUSCULAR | Status: AC
  Administered 2018-01-26: 4 mg via INTRAVENOUS
  Filled 2018-01-26: qty 2

## 2018-01-26 MED ORDER — SODIUM CHLORIDE 0.9 % IV BOLUS
500.0000 mL | Freq: Once | INTRAVENOUS | Status: AC
  Administered 2018-01-26: 500 mL via INTRAVENOUS

## 2018-01-26 NOTE — ED Notes (Signed)
To CT

## 2018-01-26 NOTE — ED Notes (Signed)
Pt seen here on Sun for abd pain  Saw GYN thurs for same   Here tonight for complaint of abd pain  Reported hard stool but then reports that she is "going fine"

## 2018-01-26 NOTE — ED Triage Notes (Signed)
Pt reports ongoing abd pain since she was last seen here on 01/21/2018, but pain has increased, pt reports last BM was yesterday and was hard stool and somewhat difficult to pass. Pt denies vaginal discharge and urinary symptoms.

## 2018-01-27 DIAGNOSIS — N83291 Other ovarian cyst, right side: Secondary | ICD-10-CM | POA: Diagnosis not present

## 2018-01-27 DIAGNOSIS — Z79899 Other long term (current) drug therapy: Secondary | ICD-10-CM | POA: Diagnosis not present

## 2018-01-27 LAB — URINALYSIS, ROUTINE W REFLEX MICROSCOPIC
Bilirubin Urine: NEGATIVE
Glucose, UA: NEGATIVE mg/dL
Hgb urine dipstick: NEGATIVE
KETONES UR: NEGATIVE mg/dL
Nitrite: NEGATIVE
PH: 7 (ref 5.0–8.0)
Protein, ur: NEGATIVE mg/dL
Specific Gravity, Urine: 1.021 (ref 1.005–1.030)

## 2018-01-27 LAB — PREGNANCY, URINE: Preg Test, Ur: NEGATIVE

## 2018-01-27 MED ORDER — ONDANSETRON HCL 4 MG/2ML IJ SOLN
4.0000 mg | Freq: Once | INTRAMUSCULAR | Status: AC
  Administered 2018-01-27: 4 mg via INTRAVENOUS
  Filled 2018-01-27: qty 2

## 2018-01-27 MED ORDER — MORPHINE SULFATE (PF) 4 MG/ML IV SOLN
4.0000 mg | Freq: Once | INTRAVENOUS | Status: AC
  Administered 2018-01-27: 4 mg via INTRAVENOUS
  Filled 2018-01-27: qty 1

## 2018-01-27 NOTE — ED Provider Notes (Signed)
I assumed care in signout to follow-up with CT imaging.  CT imaging reveals adnexal cyst.  Patient is otherwise well-appearing and in no acute distress.  Suspicion for TOA/torsion is low.  She has been given referral to OB/GYN for follow-up outpatient ultrasound.  Patient is agreeable with plan   Ripley Fraise, MD 01/27/18 0140

## 2018-01-27 NOTE — ED Provider Notes (Signed)
[facility] EMERGENCY DEPARTMENT Provider Note   CSN: [id number] Arrival date & time: 01/26/18  2013     History   Chief Complaint Chief Complaint  Patient presents with  . Abdominal Pain    HPI Deanna Hughes is a 28 y.o. female.  Right mid lateral abdominal pain with radiation to the right lower quadrant for 1 week.  Last menstrual period 01/05/2018.  No dysuria, hematuria, fever, sweats, chills, vaginal discharge, vaginal bleeding.  Severity of pain is moderate.  Palpation makes pain worse.     Past Medical History:  Diagnosis Date  . BV (bacterial vaginosis) 04/02/2015  . Chlamydia    treated 6/6  . Chronic back pain   . Chronic headaches    MIGRAINES  . Contraceptive management 01/25/2013  . Depression   . Encounter for Nexplanon removal 02/27/2015  . Irregular bleeding 06/03/2013  . Miscarriage 03/27/2013  . Nexplanon insertion 04/11/2013   nexplanon inserted 04/11/13 remove 3/32/18  . Right sided abdominal pain   . Scoliosis   . Seasonal allergies 07/03/2012  . Sinus infection 08/27/2013  . Supervision of normal pregnancy 08/27/2015    [facility] Initiated Care at   7+5 weeks  FOB  Dating By  LMP  Pap  GC/CT Initial:                36+wks: Genetic Screen NT/IT:  CF screen  Anatomic [place]  Flu vaccine  Tdap Recommended ~ 28wks Glucose Screen  2 hr GBS  Feed Preference  Contraception  Circumcision  Childbirth Classes  Pediatrician    . Threatened abortion in early pregnancy 03/20/2013  . Vaginal discharge 10/03/2012  . Yeast infection 07/03/2012    Patient Active Problem List   Diagnosis Date Noted  . Right inguinal hernia 12/14/2015  . BV (bacterial vaginosis) 04/02/2015  . Sinus infection 08/27/2013  . Irregular bleeding 06/03/2013  . Seasonal allergies 07/03/2012  . Defecation urgency     Past Surgical History:  Procedure Laterality Date  . INGUINAL HERNIA REPAIR Right 04/19/2017   Procedure: HERNIA REPAIR INGUINAL ADULT;  Surgeon: Robert Bellow, MD;   Location: [facility] ORS;  Service: General;  Laterality: Right;  . WISDOM TOOTH EXTRACTION       OB History    Gravida  5   Para  3   Term  3   Preterm  0   AB  2   Living  3     SAB  2   TAB  0   Ectopic  0   Multiple  0   Live Births  3        Obstetric Comments  1st Menstrual Cycle:  15  1st Pregnancy:  18          Home Medications    Prior to Admission medications   Medication Sig Start Date End Date Taking? Authorizing Provider  acetaminophen (TYLENOL) 500 MG tablet Take 1,000 mg by mouth 3 (three) times daily as needed for moderate pain or headache.     [provider]  citalopram (CELEXA) 20 MG tablet Take 1 tablet (20 mg total) by mouth daily. 01/24/18   Roma Schanz, CNM  dronabinol (MARINOL) 2.5 MG capsule Take 1 capsule (2.5 mg total) by mouth 2 (two) times daily before lunch and supper. 11/02/17   Cresenzo-Dishmon, Joaquim Lai, CNM  etonogestrel (NEXPLANON) 68 MG IMPL implant 1 each once by Subdermal route.    [provider]  fluticasone (FLONASE) 50 MCG/ACT nasal  spray Place 2 sprays into both nostrils daily as needed for allergies.  12/19/16   [provider]  HYDROcodone-acetaminophen (NORCO/VICODIN) 5-325 MG tablet Take 1 tablet by mouth every 6 (six) hours as needed for severe pain. 01/22/18   Ripley Fraise, MD  megestrol (MEGACE) 40 MG tablet 3 tablets a day for 5 days, 2 tablets a day for 5 days then 1 tablet daily 11/03/17   Florian Buff, MD  metroNIDAZOLE (FLAGYL) 500 MG tablet Take 1 tablet (500 mg total) by mouth 2 (two) times daily. 01/24/18   Roma Schanz, CNM  Multiple Vitamin (MULTIVITAMIN) tablet Take 1 tablet by mouth daily.    [provider]    Family History Family History  Problem Relation Age of Onset  . Asthma Mother   . Asthma Sister   . Asthma Brother   . Asthma Daughter   . Diabetes Maternal Grandmother   . Hypertension Maternal Grandmother   . Asthma Maternal Grandmother   .  Heart disease Maternal Grandmother   . Stroke Maternal Grandmother   . Hypertension Maternal Grandfather   . Asthma Maternal Grandfather   . Heart disease Maternal Grandfather   . Asthma Son   . Colon cancer Neg Hx     Social History Social History   Tobacco Use  . Smoking status: Never Smoker  . Smokeless tobacco: Never Used  Substance Use Topics  . Alcohol use: No  . Drug use: No     Allergies   Aspirin and Advil [ibuprofen]   Review of Systems Review of Systems  All other systems reviewed and are negative.    Physical Exam Updated Vital Signs BP 117/72 (BP Location: Right Arm)   Pulse 81   Temp 98.5 F (36.9 C) (Oral)   Resp 17   Ht 5\' 2"  (1.575 m)   Wt 69.4 kg   LMP 01/05/2018   SpO2 98%   BMI 27.98 kg/m   Physical Exam Vitals signs and nursing note reviewed.  Constitutional:      Appearance: She is well-developed.     Comments: nad  HENT:     Head: Normocephalic and atraumatic.  Eyes:     Conjunctiva/sclera: Conjunctivae normal.  Neck:     Musculoskeletal: Neck supple.  Cardiovascular:     Rate and Rhythm: Normal rate and regular rhythm.  Pulmonary:     Effort: Pulmonary effort is normal.     Breath sounds: Normal breath sounds.  Abdominal:     General: Bowel sounds are normal.     Palpations: Abdomen is soft.     Comments: Tender right lower quadrant.  Musculoskeletal: Normal range of motion.  Skin:    General: Skin is warm and dry.  Neurological:     Mental Status: She is alert and oriented to person, place, and time.  Psychiatric:        Behavior: Behavior normal.      ED Treatments / Results  Labs (all labs ordered are listed, but only abnormal results are displayed) Labs Reviewed  COMPREHENSIVE METABOLIC PANEL  CBC  PREGNANCY, URINE  URINALYSIS, ROUTINE W REFLEX MICROSCOPIC    EKG None  Radiology No results found.  Procedures Procedures (including critical care time)  Medications Ordered in ED Medications    iopamidol (ISOVUE-300) 61 % injection 100 mL (has no administration in time range)  sodium chloride 0.9 % bolus 500 mL (500 mLs Intravenous New Bag/Given 01/26/18 2212)  ondansetron (ZOFRAN) injection 4 mg (4 mg Intravenous Given  01/26/18 2212)  morphine 4 MG/ML injection 4 mg (4 mg Intravenous Given 01/26/18 2212)     Initial Impression / Assessment and Plan / ED Course  I have reviewed the triage vital signs and the nursing notes.  Pertinent labs & imaging results that were available during my care of the patient were reviewed by me and considered in my medical decision making (see chart for details).     Patient presented with right-sided abdominal pain for 1 week.  White count normal.  Pregnancy test negative.  Urinalysis and CT abdomen/pelvis pending.  Disc c Dr Christy Gentles.  Final Clinical Impressions(s) / ED Diagnoses   Final diagnoses:  Right lower quadrant abdominal pain    ED Discharge Orders    None       Nat Christen, MD 01/27/18 [phone]

## 2018-01-27 NOTE — Discharge Instructions (Addendum)

## 2018-02-06 ENCOUNTER — Ambulatory Visit: Payer: 59 | Admitting: Adult Health

## 2018-02-21 ENCOUNTER — Ambulatory Visit: Payer: 59 | Admitting: Women's Health

## 2018-02-22 ENCOUNTER — Ambulatory Visit: Payer: Commercial Managed Care - PPO | Admitting: Women's Health

## 2018-03-01 ENCOUNTER — Encounter: Payer: Self-pay | Admitting: Adult Health

## 2018-03-01 ENCOUNTER — Ambulatory Visit (INDEPENDENT_AMBULATORY_CARE_PROVIDER_SITE_OTHER): Payer: 59 | Admitting: Adult Health

## 2018-03-01 VITALS — BP 118/73 | HR 74 | Ht 62.0 in | Wt 166.5 lb

## 2018-03-01 DIAGNOSIS — B9689 Other specified bacterial agents as the cause of diseases classified elsewhere: Secondary | ICD-10-CM | POA: Diagnosis not present

## 2018-03-01 DIAGNOSIS — N760 Acute vaginitis: Secondary | ICD-10-CM

## 2018-03-01 DIAGNOSIS — N83201 Unspecified ovarian cyst, right side: Secondary | ICD-10-CM | POA: Diagnosis not present

## 2018-03-01 DIAGNOSIS — N898 Other specified noninflammatory disorders of vagina: Secondary | ICD-10-CM | POA: Insufficient documentation

## 2018-03-01 DIAGNOSIS — R1031 Right lower quadrant pain: Secondary | ICD-10-CM | POA: Diagnosis not present

## 2018-03-01 LAB — POCT WET PREP (WET MOUNT)
Clue Cells Wet Prep Whiff POC: POSITIVE
Trichomonas Wet Prep HPF POC: ABSENT
WBC, Wet Prep HPF POC: POSITIVE

## 2018-03-01 MED ORDER — METRONIDAZOLE 500 MG PO TABS: 500.0000 mg | ORAL_TABLET | Freq: Two times a day (BID) | ORAL | 0 refills | Status: DC

## 2018-03-01 NOTE — Progress Notes (Signed)
Patient ID: Deanna Hughes, female   DOB: 1990-02-23, 28 y.o.   MRN: [id number] History of Present Illness:  [name] is a 28 year old black female in complaining of vaginal odor and discharge. She had pelvic pain and was seen in ER 10/2018 and CT showed 6.9 cm right adexal cystic mass.Still has pain in right side, and has had panic attacks.She has nexplanon.   Current Medications, Allergies, Past Medical History, Past Surgical History, Family History and Social History were reviewed in [employer] record.     Review of Systems: +vaginal discharge with odor Has taken tub baths Pain in right side +hx panic attacks    Physical Exam:BP 118/73 (BP Location: Left Arm, Patient Position: Sitting, Cuff Size: Normal)   Pulse 74   Ht 5\' 2"  (1.575 m)   Wt 166 lb 8 oz (75.5 kg)   LMP 02/05/2018 (Approximate)   Breastfeeding No   BMI 30.45 kg/m  General:  Well developed, well nourished, no acute distress Skin:  Warm and dry Pelvic:  External genitalia is normal in appearance, no lesions.  The vagina has red side walls, with creamy discharge with odor. Urethra has no lesions or masses. The cervix is bulbous.  Uterus is felt to be normal size, shape, and contour.  No adnexal masses, RLQ  tenderness noted.Bladder is non tender, no masses felt. Wet prep:+WBCs and clue cells, Nuswab obtained. Psych:  No mood changes, alert and cooperative,seems happy Examination chaperoned by Deanna Pupa LPN. Will treat with flagyl and schedule F/U US in 3 weeks.  Impression: 1. BV (bacterial vaginosis)   2. Vaginal odor   3. Vaginal discharge   4. Pain, abdominal, RLQ   5. Right ovarian cyst       Plan: Rx flagyl 500 mg 1 bid x 7 days, no alcohol, review handout on BV    Review handout on ovary cyst Return in 3 weeks for GYN [place], will talk when results back.

## 2018-03-01 NOTE — Patient Instructions (Addendum)
Bacterial Vaginosis  Bacterial vaginosis is a vaginal infection that occurs when the normal balance of bacteria in the vagina is disrupted. It results from an overgrowth of certain bacteria. This is the most common vaginal infection among women ages 15-44. Because bacterial vaginosis increases your risk for STIs (sexually transmitted infections), getting treated can help reduce your risk for chlamydia, gonorrhea, herpes, and HIV (human immunodeficiency virus). Treatment is also important for preventing complications in pregnant women, because this condition can cause an early (premature) delivery. What are the causes? This condition is caused by an increase in harmful bacteria that are normally present in small amounts in the vagina. However, the reason that the condition develops is not fully understood. What increases the risk? The following factors may make you more likely to develop this condition:  Having a new sexual partner or multiple sexual partners.  Having unprotected sex.  Douching.  Having an intrauterine device (IUD).  Smoking.  Drug and alcohol abuse.  Taking certain antibiotic medicines.  Being pregnant. You cannot get bacterial vaginosis from toilet seats, bedding, swimming pools, or contact with objects around you. What are the signs or symptoms? Symptoms of this condition include:  Grey or white vaginal discharge. The discharge can also be watery or foamy.  A fish-like odor with discharge, especially after sexual intercourse or during menstruation.  Itching in and around the vagina.  Burning or pain with urination. Some women with bacterial vaginosis have no signs or symptoms. How is this diagnosed? This condition is diagnosed based on:  Your medical history.  A physical exam of the vagina.  Testing a sample of vaginal fluid under a microscope to look for a large amount of bad bacteria or abnormal cells. Your health care provider may use a cotton swab or  a small wooden spatula to collect the sample. How is this treated? This condition is treated with antibiotics. These may be given as a pill, a vaginal cream, or a medicine that is put into the vagina (suppository). If the condition comes back after treatment, a second round of antibiotics may be needed. Follow these instructions at home: Medicines  Take over-the-counter and prescription medicines only as told by your health care provider.  Take or use your antibiotic as told by your health care provider. Do not stop taking or using the antibiotic even if you start to feel better. General instructions  If you have a female sexual partner, tell her that you have a vaginal infection. She should see her health care provider and be treated if she has symptoms. If you have a female sexual partner, he does not need treatment.  During treatment: ? Avoid sexual activity until you finish treatment. ? Do not douche. ? Avoid alcohol as directed by your health care provider. ? Avoid breastfeeding as directed by your health care provider.  Drink enough water and fluids to keep your urine clear or pale yellow.  Keep the area around your vagina and rectum clean. ? Wash the area daily with warm water. ? Wipe yourself from front to back after using the toilet.  Keep all follow-up visits as told by your health care provider. This is important. How is this prevented?  Do not douche.  Wash the outside of your vagina with warm water only.  Use protection when having sex. This includes latex condoms and dental dams.  Limit how many sexual partners you have. To help prevent bacterial vaginosis, it is best to have sex with just one partner (  monogamous).  Make sure you and your sexual partner are tested for STIs.  Wear cotton or cotton-lined underwear.  Avoid wearing tight pants and pantyhose, especially during summer.  Limit the amount of alcohol that you drink.  Do not use any products that contain  nicotine or tobacco, such as cigarettes and e-cigarettes. If you need help quitting, ask your health care provider.  Do not use illegal drugs. Where to find more information  Centers for Disease Control and Prevention: AppraiserFraud.fi  American Sexual Health Association (ASHA): www.ashastd.org  U.S. Department of Health and Financial controller, Office on Women's Health: DustingSprays.pl or SecuritiesCard.it Contact a health care provider if:  Your symptoms do not improve, even after treatment.  You have more discharge or pain when urinating.  You have a fever.  You have pain in your abdomen.  You have pain during sex.  You have vaginal bleeding between periods. Summary  Bacterial vaginosis is a vaginal infection that occurs when the normal balance of bacteria in the vagina is disrupted.  Because bacterial vaginosis increases your risk for STIs (sexually transmitted infections), getting treated can help reduce your risk for chlamydia, gonorrhea, herpes, and HIV (human immunodeficiency virus). Treatment is also important for preventing complications in pregnant women, because the condition can cause an early (premature) delivery.  This condition is treated with antibiotic medicines. These may be given as a pill, a vaginal cream, or a medicine that is put into the vagina (suppository). This information is not intended to replace advice given to you by your health care provider. Make sure you discuss any questions you have with your health care provider. Document Released: 01/03/2005 Document Revised: 05/09/2016 Document Reviewed: 09/19/2015 Elsevier Interactive Patient Education  2019 [employer]. Ovarian Cyst An ovarian cyst is a fluid-filled sac on an ovary. The ovaries are organs that make eggs in women. Most ovarian cysts go away on their own and are not cancerous (are benign). Some cysts need treatment. Follow these instructions at  home:  Take over-the-counter and prescription medicines only as told by your doctor.  Do not drive or use heavy machinery while taking prescription pain medicine.  Get pelvic exams and Pap tests as often as told by your doctor.  Return to your normal activities as told by your doctor. Ask your doctor what activities are safe for you.  Do not use any products that contain nicotine or tobacco, such as cigarettes and e-cigarettes. If you need help quitting, ask your doctor.  Keep all follow-up visits as told by your doctor. This is important. Contact a doctor if:  Your periods are: ? Late. ? Irregular. ? Painful.   Your periods stop.  You have pelvic pain that does not go away.  You have pressure on your bladder.  You have trouble making your bladder empty when you pee (urinate).  You have pain during sex.  You have any of the following in your belly (abdomen): ? A feeling of fullness. ? Pressure. ? Discomfort. ? Pain that does not go away. ? Swelling.  You feel sick most of the time.  You have trouble pooping (have constipation).  You are not as hungry as usual (you lose your appetite).  You get very bad acne.  You start to have more hair on your body and face.  You are gaining weight or losing weight without changing your exercise and eating habits.  You think you may be pregnant. Get help right away if:  You have belly pain that is  very bad or gets worse.  You cannot eat or drink without throwing up (vomiting).  You suddenly get a fever.  Your period is a lot heavier than usual. This information is not intended to replace advice given to you by your health care provider. Make sure you discuss any questions you have with your health care provider. Document Released: 06/22/2007 Document Revised: 07/24/2015 Document Reviewed: 06/07/2015 Elsevier Interactive Patient Education  2019 [employer].

## 2018-03-06 LAB — NUSWAB VAGINITIS PLUS (VG+)
Atopobium vaginae: HIGH Score — AB
BVAB 2: HIGH {score} — AB
CANDIDA GLABRATA, NAA: NEGATIVE
Candida albicans, NAA: NEGATIVE
Chlamydia trachomatis, NAA: NEGATIVE
MEGASPHAERA 1: HIGH {score} — AB
Neisseria gonorrhoeae, NAA: NEGATIVE
Trich vag by NAA: NEGATIVE

## 2018-03-07 ENCOUNTER — Telehealth: Payer: Self-pay | Admitting: Family Medicine

## 2018-03-07 NOTE — Telephone Encounter (Signed)
lmom to call back office for appt with Jarrett Soho NP

## 2018-03-12 ENCOUNTER — Telehealth: Payer: Self-pay | Admitting: Adult Health

## 2018-03-12 DIAGNOSIS — Z131 Encounter for screening for diabetes mellitus: Secondary | ICD-10-CM | POA: Diagnosis not present

## 2018-03-12 DIAGNOSIS — R300 Dysuria: Secondary | ICD-10-CM | POA: Diagnosis not present

## 2018-03-12 DIAGNOSIS — R51 Headache: Secondary | ICD-10-CM | POA: Diagnosis not present

## 2018-03-12 DIAGNOSIS — M419 Scoliosis, unspecified: Secondary | ICD-10-CM | POA: Diagnosis not present

## 2018-03-12 MED ORDER — FLUCONAZOLE 150 MG PO TABS: ORAL_TABLET | ORAL | 1 refills | Status: DC

## 2018-03-12 NOTE — Telephone Encounter (Signed)
rx diflucan  

## 2018-03-12 NOTE — Telephone Encounter (Signed)
Patient called stating that she would like for [facility] to call her in something for her yeast infection. Patient states that Anderson Malta has given her a medication that caused a yeast infection. Please contact pt when done

## 2018-03-12 NOTE — Telephone Encounter (Signed)
LMOVM that I will send her request for medication for yeast infection to a provider. Advised to check with pharmacy later today.

## 2018-03-13 ENCOUNTER — Encounter: Payer: Self-pay | Admitting: Family Medicine

## 2018-03-13 ENCOUNTER — Ambulatory Visit (INDEPENDENT_AMBULATORY_CARE_PROVIDER_SITE_OTHER): Payer: 59 | Admitting: Family Medicine

## 2018-03-13 VITALS — BP 112/74 | HR 98 | Resp 12 | Ht 62.0 in | Wt 169.1 lb

## 2018-03-13 DIAGNOSIS — G43719 Chronic migraine without aura, intractable, without status migrainosus: Secondary | ICD-10-CM

## 2018-03-13 DIAGNOSIS — M419 Scoliosis, unspecified: Secondary | ICD-10-CM

## 2018-03-13 DIAGNOSIS — N926 Irregular menstruation, unspecified: Secondary | ICD-10-CM | POA: Diagnosis not present

## 2018-03-13 DIAGNOSIS — K649 Unspecified hemorrhoids: Secondary | ICD-10-CM | POA: Diagnosis not present

## 2018-03-13 DIAGNOSIS — J302 Other seasonal allergic rhinitis: Secondary | ICD-10-CM | POA: Diagnosis not present

## 2018-03-13 DIAGNOSIS — E669 Obesity, unspecified: Secondary | ICD-10-CM | POA: Diagnosis not present

## 2018-03-13 MED ORDER — AMITRIPTYLINE HCL 10 MG PO TABS: 10.0000 mg | ORAL_TABLET | Freq: Every day | ORAL | 0 refills | Status: DC

## 2018-03-13 NOTE — Patient Instructions (Signed)
    Thank you for coming into the office today. I appreciate the opportunity to provide you with the care for your health and wellness.  Preparation H and flushable wipes (during episodes).  Pick up your Elavil, 10 mg daily at bedtime.  It was a pleasure to see you and I look forward to continuing to work together on your health and well-being. Please do not hesitate to call the office if you need care or have questions about your care.  Have a wonderful day and week.  With Gratitude,  Cherly Beach, DNP, AGNP-BC

## 2018-03-13 NOTE — Progress Notes (Signed)
New Patient Office Visit  Subjective:  Patient ID: Deanna Hughes, female    DOB: Jul 02, 1990  Age: 28 y.o. MRN: [id number]  CC:  Chief Complaint  Patient presents with  . New Patient (Initial Visit)    establish care    HPI Deanna Hughes presents for establishment of PCP.  28 year old female patient also [facility] [place].  Pertinent history includes chronic back pain/scoliosis, chronic headaches migraines, contraception management, irregular bleeding, hemorrhoids, and ovarian cysts.  Most recently seen by gynecologist, diagnosed with BV, provided with treatment.  Was recently seen at the health department per her, was told that she had low vitamin D and was told that she to start vitamin D supplementation.  Unsure of how much.  Was also referred by the health department to a scoliosis specialist.  Unsure of who the specialist is.  Currently works in Arboriculturist at [employer].  Reports need to have a primary care provider to help manage more chronic conditions such as scoliosis and migraines.  Today in office would like to talk about her headaches and hemorrhoids.  Today in office she reports that she has ongoing bad headache/has been told she has migraines before in the past.  Not currently on any treatment for it.  Takes Tylenol, but reports this does not work.  Has an allergy to ibuprofen that causes heart palpitations and cannot breathe.  She has sensitivity to light and sound when she does have a bad headache.  No nausea and vomiting are reported.  Reports that she has 5 or more headaches a month.  Reports that she cannot call out of work due to occurrences.  Usually in order to get rid of the headache she has to lay down in a dark room.  And or try to fall asleep.  Reports that it is in the frontal area when she does have headaches.  Denies having any stress, and or triggers related to these.  Does not believe that it is related to her cycle, caffeine, food, lack  of sleep.  Would like to see if she can get on something for management of this today.   Today she also mentions that she is having some issues with external hemorrhoids.  She says it these come and go ever since she had her last son.  Who is now 67 years old.  She has not tried anything over-the-counter at this time.  Does not report having excessive bleeding from these.   Reports that she has irregular cycles, is on the Nexplanon.  Takes Megace as needed when she has irregular bleeding.  [facility]  Current medications include Tylenol as needed, Flonase as needed for allergies, Megace and Nexplanon as needed for.,  Recent use of Flagyl and Diflucan secondary to having BV.  Past Medical History:  Diagnosis Date  . BV (bacterial vaginosis) 04/02/2015  . Chlamydia    treated 6/6  . Chronic back pain   . Chronic headaches    MIGRAINES  . Contraceptive management 01/25/2013  . Depression   . Encounter for Nexplanon removal 02/27/2015  . Irregular bleeding 06/03/2013  . Miscarriage 03/27/2013  . Nexplanon insertion 04/11/2013   nexplanon inserted 04/11/13 remove 3/32/18  . Right sided abdominal pain   . Scoliosis   . Seasonal allergies 07/03/2012  . Sinus infection 08/27/2013  . Supervision of normal pregnancy 08/27/2015    [facility]iated Care at   7+5 weeks  FOB  Dating By  LMP  Pap  GC/CT Initial:                36+wks: Genetic Screen NT/IT:  CF screen  Anatomic [place]  Flu vaccine  Tdap Recommended ~ 28wks Glucose Screen  2 hr GBS  Feed Preference  Contraception  Circumcision  Childbirth Classes  Pediatrician    . Threatened abortion in early pregnancy 03/20/2013  . Vaginal discharge 10/03/2012  . Yeast infection 07/03/2012    Past Surgical History:  Procedure Laterality Date  . INGUINAL HERNIA REPAIR Right 04/19/2017   Procedure: HERNIA REPAIR INGUINAL ADULT;  Surgeon: Robert Bellow, MD;  Location: [facility] ORS;  Service: General;  Laterality: Right;  . WISDOM TOOTH  EXTRACTION      Family History  Problem Relation Age of Onset  . Asthma Mother   . Asthma Sister   . Asthma Brother   . Asthma Daughter   . Diabetes Maternal Grandmother   . Hypertension Maternal Grandmother   . Asthma Maternal Grandmother   . Heart disease Maternal Grandmother   . Stroke Maternal Grandmother   . Hypertension Maternal Grandfather   . Asthma Maternal Grandfather   . Heart disease Maternal Grandfather   . Asthma Son   . Colon cancer Neg Hx     Social History   Socioeconomic History  . Marital status: Single    Spouse name: Not on file  . Number of children: Not on file  . Years of education: Not on file  . Highest education level: Not on file  Occupational History  . Not on file  Social Needs  . Financial resource strain: Not hard at all  . Food insecurity:    Worry: Never true    Inability: Never true  . Transportation needs:    Medical: No    Non-medical: No  Tobacco Use  . Smoking status: Never Smoker  . Smokeless tobacco: Never Used  Substance and Sexual Activity  . Alcohol use: No  . Drug use: No  . Sexual activity: Yes    Birth control/protection: Implant  Lifestyle  . Physical activity:    Days per week: 0 days    Minutes per session: 0 min  . Stress: To some extent  Relationships  . Social connections:    Talks on phone: More than three times a week    Gets together: More than three times a week    Attends religious service: Never    Active member of club or organization: No    Attends meetings of clubs or organizations: Never    Relationship status: Never married  . Intimate partner violence:    Fear of current or ex partner: No    Emotionally abused: No    Physically abused: No    Forced sexual activity: No  Other Topics Concern  . Not on file  Social History Narrative   Lives with her 3 children      Zimiah (8)    Zatavious (6)      Zayla (1)- Daddy is present in life.        ROS Review of Systems    Constitutional: Negative for activity change, appetite change, chills, fatigue and fever.  HENT: Negative.   Eyes: Negative.   Respiratory: Negative for cough, chest tightness and shortness of breath.   Cardiovascular: Negative for chest pain and palpitations.  Gastrointestinal: Positive for constipation.       Hemorrhoids   Endocrine: Negative.   Genitourinary: Positive  for menstrual problem.  Musculoskeletal: Positive for back pain.  Skin: Negative.   Allergic/Immunologic: Positive for environmental allergies.  Neurological: Positive for headaches. Negative for dizziness, tremors, seizures, syncope, speech difficulty, weakness and numbness.       History of migraines  Psychiatric/Behavioral: Negative for decreased concentration and sleep disturbance. The patient is not nervous/anxious.   All other systems reviewed and are negative.   Objective:   Today's Vitals: BP 112/74   Pulse 98   Resp 12   Ht 5\' 2"  (1.575 m)   Wt 169 lb 1.3 oz (76.7 kg)   SpO2 98% Comment: room air  BMI 30.93 kg/m   Physical Exam Vitals signs and nursing note reviewed.  Constitutional:      Appearance: Normal appearance. She is overweight.     Comments: Overweight, on the borderline for being obese for height BMI 30.93.  HENT:     Head: Normocephalic.     Right Ear: External ear normal.     Left Ear: External ear normal.     Nose: Nose normal.  Eyes:     Conjunctiva/sclera: Conjunctivae normal.  Neck:     Musculoskeletal: Normal range of motion and neck supple.  Cardiovascular:     Rate and Rhythm: Normal rate and regular rhythm.     Pulses: Normal pulses.     Heart sounds: Normal heart sounds.  Pulmonary:     Effort: Pulmonary effort is normal.     Breath sounds: Normal breath sounds.  Musculoskeletal: Normal range of motion.  Skin:    General: Skin is warm and dry.     Capillary Refill: Capillary refill takes less than 2 seconds.  Neurological:     Mental Status: She is alert and  oriented to person, place, and time.  Psychiatric:        Mood and Affect: Mood normal.        Behavior: Behavior normal.        Thought Content: Thought content normal.     Assessment & Plan:   1. Intractable chronic migraine without aura and without status migrainosus Reports never having any continuous prophylaxis of migraines.   We will start her on Elavil low-dose 10 mg and wean up as needed.  For maintenance.  Would consider referral to neurology after trying medication.  Discussed medication and side effects.  Patient verbalized an understanding.   - amitriptyline (ELAVIL) 10 MG tablet; Take 1 tablet (10 mg total) by mouth at bedtime.  Dispense: 30 tablet; Refill: 0  2. Scoliosis, unspecified scoliosis type, unspecified spinal region Currently has a referral, from the health department, for a scoliosis specialist that she is not sure of the name of.  Advised her to send a message and will call the office to let us know where she is going to be going.  Advised her to contact the insurance company, to make sure that she does not need a referral from PCP, and if the health department would be doable not for this.   3. Hemorrhoids, unspecified hemorrhoid type Reports ongoing intermittent external hemorrhoids secondary to having her last child.  Has not tried anything over-the-counter, recommended getting Preparation H, and flushable wipes at this time.  Educated on the signs and symptoms of ongoing chronic blood loss, possibility of needing to see a GI specialist in the future if she has trouble with these.  4. Irregular bleeding Reports having irregular bleeding, has the Nexplanon.  Reports having to take Megace sometimes when her bleeding is  irregular and she has breakthrough.  [facility], appreciate their collaboration in her care.  Will defer to them for any  women's health needs.  5. Seasonal allergies Denies having any troubles today in the office with sinus  issues related to seasonal allergies.  Is on Flonase as needed.  6. Obesity (BMI 30-39.9) Educated about her current BMI, would like to see her get more involved in a health care ridging of exercise and proper diet.  Encouraged her to workout 30 minutes on most days of the week.  For total of 150 minutes weekly.  For heart health and diet weight loss.  Detailed her getting her weight under control, before she has any side effects in the future.    - Follow-up: 06/12/2018 annual follow-up with possible lab work.   Perlie Mayo, NP

## 2018-03-22 ENCOUNTER — Telehealth: Payer: Self-pay | Admitting: Adult Health

## 2018-03-22 ENCOUNTER — Ambulatory Visit (INDEPENDENT_AMBULATORY_CARE_PROVIDER_SITE_OTHER): Payer: 59

## 2018-03-22 DIAGNOSIS — R1031 Right lower quadrant pain: Secondary | ICD-10-CM | POA: Diagnosis not present

## 2018-03-22 DIAGNOSIS — N83201 Unspecified ovarian cyst, right side: Secondary | ICD-10-CM | POA: Diagnosis not present

## 2018-03-22 NOTE — Telephone Encounter (Signed)
Left message that US showed normal uterus, bilateral simple cyst both ovaries, the 6.8 cm cyst on CT has resolved, or gotten much smaller, will repeat US in 6 months, placed in recall

## 2018-03-22 NOTE — Progress Notes (Signed)
PELVIC US TA/TV: homogeneous anteverted uterus,wnl,EEC 5.3 mm,simple left ovarian cyst 3 x 2.8 x 1.4 cm,simple right ovarian cyst 3.7 x 2.8 x 3.6 cm,ovaries appear mobile,no free fluid,no pain during ultrasound

## 2018-03-27 ENCOUNTER — Emergency Department (HOSPITAL_COMMUNITY): Payer: 59

## 2018-03-27 ENCOUNTER — Ambulatory Visit (INDEPENDENT_AMBULATORY_CARE_PROVIDER_SITE_OTHER)
Admission: EM | Admit: 2018-03-27 | Discharge: 2018-03-27 | Disposition: A | Discharge status: Home / Self Care | Payer: 59 | Source: Home / Self Care | Attending: Family Medicine | Admitting: Family Medicine

## 2018-03-27 ENCOUNTER — Other Ambulatory Visit: Payer: Self-pay

## 2018-03-27 ENCOUNTER — Emergency Department (HOSPITAL_COMMUNITY)
Admission: EM | Admit: 2018-03-27 | Discharge: 2018-03-27 | Disposition: A | Discharge status: Home / Self Care | Payer: 59 | Attending: Emergency Medicine | Admitting: Emergency Medicine

## 2018-03-27 ENCOUNTER — Telehealth: Payer: Self-pay | Admitting: *Deleted

## 2018-03-27 ENCOUNTER — Encounter (HOSPITAL_COMMUNITY): Payer: Self-pay

## 2018-03-27 ENCOUNTER — Encounter (HOSPITAL_COMMUNITY): Payer: Self-pay | Admitting: *Deleted

## 2018-03-27 DIAGNOSIS — H6593 Unspecified nonsuppurative otitis media, bilateral: Secondary | ICD-10-CM | POA: Diagnosis not present

## 2018-03-27 DIAGNOSIS — Z79899 Other long term (current) drug therapy: Secondary | ICD-10-CM | POA: Insufficient documentation

## 2018-03-27 DIAGNOSIS — J069 Acute upper respiratory infection, unspecified: Secondary | ICD-10-CM | POA: Diagnosis not present

## 2018-03-27 DIAGNOSIS — R0981 Nasal congestion: Secondary | ICD-10-CM | POA: Diagnosis present

## 2018-03-27 DIAGNOSIS — R059 Cough, unspecified: Secondary | ICD-10-CM

## 2018-03-27 DIAGNOSIS — B9789 Other viral agents as the cause of diseases classified elsewhere: Secondary | ICD-10-CM

## 2018-03-27 DIAGNOSIS — R05 Cough: Secondary | ICD-10-CM | POA: Insufficient documentation

## 2018-03-27 DIAGNOSIS — H748X3 Other specified disorders of middle ear and mastoid, bilateral: Secondary | ICD-10-CM | POA: Diagnosis not present

## 2018-03-27 MED ORDER — PROMETHAZINE-DM 6.25-15 MG/5ML PO SYRP: 5.0000 mL | ORAL_SOLUTION | Freq: Four times a day (QID) | ORAL | 0 refills | Status: DC | PRN

## 2018-03-27 MED ORDER — BENZONATATE 100 MG PO CAPS: 100.0000 mg | ORAL_CAPSULE | Freq: Three times a day (TID) | ORAL | 0 refills | Status: DC

## 2018-03-27 MED ORDER — PREDNISONE 10 MG PO TABS: ORAL_TABLET | ORAL | 0 refills | Status: DC

## 2018-03-27 NOTE — ED Triage Notes (Signed)
Pt c/o cough and congestion that started last night

## 2018-03-27 NOTE — Telephone Encounter (Signed)
Pt called in sick. There were no available appts until Thursday. I made her appt. She said her chest is tight and hurts, she has a lot of pressure behind her eyes and in her head. She has not had a fever and she is not able to get any mucous up. Wanted to know what was recommended in the mean time until her appt.

## 2018-03-27 NOTE — Discharge Instructions (Signed)
Follow up with your primary care doctor or here if you are not seeing improvement of your symptoms over the next several days, sooner if you feel you are worsening. ° °Caring for yourself: °Get plenty of rest. °Drink plenty of fluids, enough so that your urine is light yellow or clear like water. If you have kidney, heart, or liver disease and have to limit fluids, talk with your doctor before you increase the amount of fluids you drink. °Take an over-the-counter pain medicine if needed, such as acetaminophen (Tylenol), ibuprofen (Advil, Motrin), or naproxen (Aleve), to relieve fever, headache, and muscle aches. Read and follow all instructions on the label. No one younger than 20 should take aspirin. It has been linked to Reye syndrome, a serious illness. °Before you use over the counter cough and cold medicines, check the label. These medicines may not be safe for children younger than age 6 or for people with certain health problems. °If the skin around your nose and lips becomes sore, put some petroleum jelly on the area. ° °Avoid spreading a respiratory virus: °Wash your hands regularly, and keep your hands away from your face.  °Stay home from school, work, and other public places until you are feeling better and your fever has been gone for at least 24 hours. The fever needs to have gone away on its own without the help of medicine. °

## 2018-03-27 NOTE — Telephone Encounter (Signed)
Called pt asked if she could come in tomorrow. She said no SOB but her chest was tight with congestion. She stated that she was going to Urgent Care tonight as she had to work tomorrow. She did want to keep her Thursday appt as of now in case she needs to follow up from urgent care

## 2018-03-27 NOTE — Telephone Encounter (Signed)
Noted  

## 2018-03-27 NOTE — Telephone Encounter (Signed)
Please advise 

## 2018-03-27 NOTE — ED Triage Notes (Signed)
Pt c/o cough and chest congestion since yesterday

## 2018-03-27 NOTE — Discharge Instructions (Addendum)
Drink plenty of fluids.  You may alternate Tylenol and ibuprofen if needed for body aches or fever.  Do not take the Tessalon while taking the promethazine cough syrup.  Follow-up with your primary doctor for recheck if needed.

## 2018-03-28 NOTE — ED Provider Notes (Signed)
[facility] EMERGENCY DEPARTMENT Provider Note   CSN: [id number] Arrival date & time: 03/27/18  2043    History   Chief Complaint Chief Complaint  Patient presents with  . Generalized Body Aches    HPI Deanna Hughes is a 28 y.o. female.     HPI  Deanna Hughes is a 28 y.o. female who presents to the Emergency Department complaining of nasal congestion and cough.  Symptoms present for one day.  Cough described as "dry" she reports a headache and fullness of her face and pain with eye movement.  She denies neck pain or stiffness, visual changes, dizziness, sore throat, chest pain, shortness of breath, fever and vomiting.  She was seen at [facility] urgent care just before arriving here. She comes here stating that she wants another opinion about her symptoms and she did not feel that she was properly evaluated.  She was prescribed an anti-tussive, but did not get it filled.    Past Medical History:  Diagnosis Date  . BV (bacterial vaginosis) 04/02/2015  . Chlamydia    treated 6/6  . Chronic back pain   . Chronic headaches    MIGRAINES  . Contraceptive management 01/25/2013  . Depression   . Encounter for Nexplanon removal 02/27/2015  . Irregular bleeding 06/03/2013  . Miscarriage 03/27/2013  . Nexplanon insertion 04/11/2013   nexplanon inserted 04/11/13 remove 3/32/18  . Right sided abdominal pain   . Scoliosis   . Seasonal allergies 07/03/2012  . Sinus infection 08/27/2013  . Supervision of normal pregnancy 08/27/2015    [facility] Care at   7+5 weeks  FOB  Dating By  LMP  Pap  GC/CT Initial:                36+wks: Genetic Screen NT/IT:  CF screen  Anatomic [place]  Flu vaccine  Tdap Recommended ~ 28wks Glucose Screen  2 hr GBS  Feed Preference  Contraception  Circumcision  Childbirth Classes  Pediatrician    . Threatened abortion in early pregnancy 03/20/2013  . Vaginal discharge 10/03/2012  . Yeast infection 07/03/2012    Patient Active Problem List   Diagnosis Date  Noted  . Right ovarian cyst 03/01/2018  . Pain, abdominal, RLQ 03/01/2018  . Vaginal discharge 03/01/2018  . Vaginal odor 03/01/2018  . Right inguinal hernia 12/14/2015  . BV (bacterial vaginosis) 04/02/2015  . Sinus infection 08/27/2013  . Irregular bleeding 06/03/2013  . Seasonal allergies 07/03/2012  . Defecation urgency     Past Surgical History:  Procedure Laterality Date  . INGUINAL HERNIA REPAIR Right 04/19/2017   Procedure: HERNIA REPAIR INGUINAL ADULT;  Surgeon: Robert Bellow, MD;  Location: [facility] ORS;  Service: General;  Laterality: Right;  . WISDOM TOOTH EXTRACTION       OB History    Gravida  5   Para  3   Term  3   Preterm  0   AB  2   Living  3     SAB  2   TAB  0   Ectopic  0   Multiple  0   Live Births  3        Obstetric Comments  1st Menstrual Cycle:  15  1st Pregnancy:  18          Home Medications    Prior to Admission medications   Medication Sig Start Date End Date Taking? Authorizing Provider  acetaminophen (TYLENOL) 500 MG tablet Take 1,000 mg  by mouth 3 (three) times daily as needed for moderate pain or headache.     [provider]  amitriptyline (ELAVIL) 10 MG tablet Take 1 tablet (10 mg total) by mouth at bedtime. 03/13/18   Perlie Mayo, NP  dronabinol (MARINOL) 2.5 MG capsule Take 1 capsule (2.5 mg total) by mouth 2 (two) times daily before lunch and supper. 11/02/17   Cresenzo-Dishmon, Joaquim Lai, CNM  etonogestrel (NEXPLANON) 68 MG IMPL implant 1 each once by Subdermal route.    [provider]  fluconazole (DIFLUCAN) 150 MG tablet Take 1 now and repeat 1 in 3 days if needed 03/12/18   Estill Dooms, NP  fluticasone [facility]) 50 MCG/ACT nasal spray Place 2 sprays into both nostrils daily as needed for allergies.  12/19/16   [provider]  megestrol (MEGACE) 40 MG tablet 3 tablets a day for 5 days, 2 tablets a day for 5 days then 1 tablet daily 11/03/17   Florian Buff, MD   metroNIDAZOLE (FLAGYL) 500 MG tablet Take 1 tablet (500 mg total) by mouth 2 (two) times daily. 03/01/18   Estill Dooms, NP  Multiple Vitamin (MULTIVITAMIN) tablet Take 1 tablet by mouth daily.    [provider]  predniSONE (DELTASONE) 10 MG tablet Take 6 tablets day one, 5 tablets day two, 4 tablets day three, 3 tablets day four, 2 tablets day five, then 1 tablet day six 03/27/18   Jaylun Fleener, PA-C  promethazine-dextromethorphan (PROMETHAZINE-DM) 6.25-15 MG/5ML syrup Take 5 mLs by mouth 4 (four) times daily as needed. May cause drowsiness 03/27/18   Raynee Mccasland, PA-C    Family History Family History  Problem Relation Age of Onset  . Asthma Mother   . Asthma Sister   . Asthma Brother   . Asthma Daughter   . Diabetes Maternal Grandmother   . Hypertension Maternal Grandmother   . Asthma Maternal Grandmother   . Heart disease Maternal Grandmother   . Stroke Maternal Grandmother   . Hypertension Maternal Grandfather   . Asthma Maternal Grandfather   . Heart disease Maternal Grandfather   . Asthma Son   . Colon cancer Neg Hx     Social History Social History   Tobacco Use  . Smoking status: Never Smoker  . Smokeless tobacco: Never Used  Substance Use Topics  . Alcohol use: No  . Drug use: No     Allergies   Aspirin and Advil [ibuprofen]   Review of Systems Review of Systems  Constitutional: Negative for activity change, appetite change, chills and fever.  HENT: Positive for congestion. Negative for ear pain, facial swelling, rhinorrhea, sore throat and trouble swallowing.   Eyes: Negative for visual disturbance.  Respiratory: Positive for cough. Negative for chest tightness, shortness of breath, wheezing and stridor.   Gastrointestinal: Negative for abdominal pain, nausea and vomiting.  Musculoskeletal: Negative for neck pain and neck stiffness.  Skin: Negative for rash.  Neurological: Positive for headaches. Negative for dizziness, syncope,  weakness and numbness.  Hematological: Negative for adenopathy.  Psychiatric/Behavioral: Negative for confusion.     Physical Exam Updated Vital Signs BP 117/76 (BP Location: Right Arm)   Pulse 84   Temp 98.5 F (36.9 C) (Oral)   Resp 16   Ht 5\' 2"  (1.575 m)   Wt 68 kg   SpO2 100%   BMI 27.44 kg/m   Physical Exam Vitals signs and nursing note reviewed.  Constitutional:      General: She is not in acute  distress.    Appearance: Normal appearance. She is not ill-appearing.  HENT:     Head: Atraumatic.     Right Ear: A middle ear effusion is present. No mastoid tenderness. Tympanic membrane is not injected or erythematous.     Left Ear: A middle ear effusion is present. No mastoid tenderness. Tympanic membrane is not injected or erythematous.     Mouth/Throat:     Mouth: Mucous membranes are moist.     Pharynx: Oropharynx is clear. Uvula midline. No pharyngeal swelling, oropharyngeal exudate, posterior oropharyngeal erythema or uvula swelling.  Eyes:     Conjunctiva/sclera: Conjunctivae normal.     Pupils: Pupils are equal, round, and reactive to light.  Neck:     Musculoskeletal: Full passive range of motion without pain and normal range of motion. No neck rigidity.     Meningeal: Kernig's sign absent.  Cardiovascular:     Rate and Rhythm: Normal rate and regular rhythm.     Pulses: Normal pulses.  Pulmonary:     Effort: Pulmonary effort is normal.     Breath sounds: Normal breath sounds. No wheezing.  Musculoskeletal: Normal range of motion.  Lymphadenopathy:     Cervical: No cervical adenopathy.  Skin:    Capillary Refill: Capillary refill takes less than 2 seconds.     Findings: No rash.  Neurological:     General: No focal deficit present.     Mental Status: She is alert. Mental status is at baseline.     GCS: GCS eye subscore is 4. GCS verbal subscore is 5. GCS motor subscore is 6.     Sensory: Sensation is intact. No sensory deficit.     Motor: Motor function  is intact. No weakness.     Gait: Gait is intact.     Comments: CN II-XII intact, speech clear.    Psychiatric:        Mood and Affect: Mood normal.      ED Treatments / Results  Labs (all labs ordered are listed, but only abnormal results are displayed) Labs Reviewed - No data to display  EKG None  Radiology Dg Chest 2 View  Result Date: 03/27/2018 CLINICAL DATA:  Cough EXAM: CHEST - 2 VIEW COMPARISON:  01/21/2018, 03/11/2015 FINDINGS: Mild scoliosis of the spine. No focal consolidation or effusion. Stable cardiomediastinal silhouette. Mild central airways thickening. No pneumothorax. IMPRESSION: No active cardiopulmonary disease. Mild central airways thickening which may be secondary to inflammatory process. Electronically Signed   By: Donavan Foil M.D.   On: 03/27/2018 21:40    Procedures Procedures (including critical care time)  Medications Ordered in ED Medications - No data to display   Initial Impression / Assessment and Plan / ED Course  I have reviewed the triage vital signs and the nursing notes.  Pertinent labs & imaging results that were available during my care of the patient were reviewed by me and considered in my medical decision making (see chart for details).        Pt well appearing, vitals reassuring. No hx of fever.  NV intact and no meningeal signs.  CXR neg for PNA.  Mild middle ear effusion bilaterally with sx's of  facial pain and headache, felt to be possible sinusitis. Will tx with steroids.  Pt requesting medication for cough.  She appears appropriate for d/c home.  Return precautions discussed    Final Clinical Impressions(s) / ED Diagnoses   Final diagnoses:  Middle ear effusion, bilateral  Cough  ED Discharge Orders         Ordered    promethazine-dextromethorphan (PROMETHAZINE-DM) 6.25-15 MG/5ML syrup  4 times daily PRN     03/27/18 2219    predniSONE (DELTASONE) 10 MG tablet     03/27/18 2219           Kem Parkinson,  PA-C 03/28/18 2342    Milton Ferguson, MD 03/31/18 1237

## 2018-03-28 NOTE — ED Provider Notes (Signed)
[name]   [id number] 03/27/18 Arrival Time: 1941  ASSESSMENT & PLAN:  1. Viral URI with cough     See AVS for discharge instructions.  Meds ordered this encounter  Medications  . benzonatate (TESSALON) 100 MG capsule    Sig: Take 1 capsule (100 mg total) by mouth every 8 (eight) hours.    Dispense:  21 capsule    Refill:  0   Discussed typical duration of symptoms. OTC symptom care as needed. Ensure adequate fluid intake and rest. May f/u with PCP or here as needed.  Reviewed expectations re: course of current medical issues. Questions answered. Outlined signs and symptoms indicating need for more acute intervention. Patient verbalized understanding. After Visit Summary given.   SUBJECTIVE: History from: patient.  Deanna Hughes is a 28 y.o. female who presents with complaint of nasal congestion, post-nasal drainage, and a persistent dry cough; without sore throat. Onset abrupt, yesterday; with mild fatigue and without body aches. SOB: none. Wheezing: none. Fever: no. Overall normal PO intake without n/v. Known sick contacts: no. No specific or significant aggravating or alleviating factors reported. OTC treatment: none.  Social History   Tobacco Use  Smoking Status Never Smoker  Smokeless Tobacco Never Used    ROS: As per HPI.   OBJECTIVE:  Vitals:   03/27/18 1958  BP: 124/78  Pulse: 87  Resp: 18  Temp: 98.1 F (36.7 C)  TempSrc: Oral  SpO2: 100%     General appearance: alert; appears fatigued HEENT: nasal congestion; clear runny nose; throat irritation secondary to post-nasal drainage Neck: supple without LAD CV: RRR Lungs: unlabored respirations, symmetrical air entry without wheezing; cough: mild Abd: soft Ext: no LE edema Skin: warm and dry Psychological: alert and cooperative; normal mood and affect   Allergies  Allergen Reactions  . Aspirin Shortness Of Breath and Other (See Comments)    Heart beats fast  . Advil  [Ibuprofen] Other (See Comments)    Breakout, heart palpitations and trouble breathing. Cant take Advil but can take Ibuprofen    Past Medical History:  Diagnosis Date  . BV (bacterial vaginosis) 04/02/2015  . Chlamydia    treated 6/6  . Chronic back pain   . Chronic headaches    MIGRAINES  . Contraceptive management 01/25/2013  . Depression   . Encounter for Nexplanon removal 02/27/2015  . Irregular bleeding 06/03/2013  . Miscarriage 03/27/2013  . Nexplanon insertion 04/11/2013   nexplanon inserted 04/11/13 remove 3/32/18  . Right sided abdominal pain   . Scoliosis   . Seasonal allergies 07/03/2012  . Sinus infection 08/27/2013  . Supervision of normal pregnancy 08/27/2015    [facility] at   7+5 weeks  FOB  Dating By  LMP  Pap  GC/CT Initial:                36+wks: Genetic Screen NT/IT:  CF screen  Anatomic [place]  Flu vaccine  Tdap Recommended ~ 28wks Glucose Screen  2 hr GBS  Feed Preference  Contraception  Circumcision  Childbirth Classes  Pediatrician    . Threatened abortion in early pregnancy 03/20/2013  . Vaginal discharge 10/03/2012  . Yeast infection 07/03/2012   Family History  Problem Relation Age of Onset  . Asthma Mother   . Asthma Sister   . Asthma Brother   . Asthma Daughter   . Diabetes Maternal Grandmother   . Hypertension Maternal Grandmother   . Asthma Maternal Grandmother   . Heart  disease Maternal Grandmother   . Stroke Maternal Grandmother   . Hypertension Maternal Grandfather   . Asthma Maternal Grandfather   . Heart disease Maternal Grandfather   . Asthma Son   . Colon cancer Neg Hx    Social History   Socioeconomic History  . Marital status: Single    Spouse name: Not on file  . Number of children: Not on file  . Years of education: Not on file  . Highest education level: Not on file  Occupational History  . Not on file  Social Needs  . Financial resource strain: Not hard at all  . Food insecurity:    Worry: Never true     Inability: Never true  . Transportation needs:    Medical: No    Non-medical: No  Tobacco Use  . Smoking status: Never Smoker  . Smokeless tobacco: Never Used  Substance and Sexual Activity  . Alcohol use: No  . Drug use: No  . Sexual activity: Yes    Birth control/protection: Implant  Lifestyle  . Physical activity:    Days per week: 0 days    Minutes per session: 0 min  . Stress: To some extent  Relationships  . Social connections:    Talks on phone: More than three times a week    Gets together: More than three times a week    Attends religious service: Never    Active member of club or organization: No    Attends meetings of clubs or organizations: Never    Relationship status: Never married  . Intimate partner violence:    Fear of current or ex partner: No    Emotionally abused: No    Physically abused: No    Forced sexual activity: No  Other Topics Concern  . Not on file  Social History Narrative   Lives with her 3 children      Deanna Hughes (8)    Deanna Hughes (6)      Deanna Hughes (1)- Daddy is present in life.               Vanessa Kick, MD 03/28/18 [phone]

## 2018-03-29 ENCOUNTER — Ambulatory Visit: Payer: 59 | Admitting: Family Medicine

## 2018-04-04 ENCOUNTER — Telehealth: Payer: Self-pay

## 2018-04-04 ENCOUNTER — Other Ambulatory Visit: Payer: Self-pay

## 2018-04-04 ENCOUNTER — Ambulatory Visit (INDEPENDENT_AMBULATORY_CARE_PROVIDER_SITE_OTHER): Payer: 59 | Admitting: Family Medicine

## 2018-04-04 ENCOUNTER — Other Ambulatory Visit (HOSPITAL_COMMUNITY)
Admission: RE | Admit: 2018-04-04 | Discharge: 2018-04-04 | Disposition: A | Discharge status: Home / Self Care | Payer: 59 | Source: Ambulatory Visit | Attending: Family Medicine | Admitting: Family Medicine

## 2018-04-04 ENCOUNTER — Encounter: Payer: Self-pay | Admitting: Family Medicine

## 2018-04-04 ENCOUNTER — Ambulatory Visit: Payer: 59 | Admitting: Obstetrics and Gynecology

## 2018-04-04 VITALS — BP 112/74 | HR 99 | Temp 98.5°F | Ht 62.0 in | Wt 170.1 lb

## 2018-04-04 DIAGNOSIS — K649 Unspecified hemorrhoids: Secondary | ICD-10-CM | POA: Diagnosis not present

## 2018-04-04 DIAGNOSIS — B851 Pediculosis due to Pediculus humanus corporis: Secondary | ICD-10-CM | POA: Insufficient documentation

## 2018-04-04 DIAGNOSIS — N898 Other specified noninflammatory disorders of vagina: Secondary | ICD-10-CM | POA: Diagnosis not present

## 2018-04-04 DIAGNOSIS — N760 Acute vaginitis: Secondary | ICD-10-CM

## 2018-04-04 DIAGNOSIS — B9689 Other specified bacterial agents as the cause of diseases classified elsewhere: Secondary | ICD-10-CM | POA: Diagnosis not present

## 2018-04-04 DIAGNOSIS — E669 Obesity, unspecified: Secondary | ICD-10-CM | POA: Diagnosis not present

## 2018-04-04 DIAGNOSIS — R350 Frequency of micturition: Secondary | ICD-10-CM

## 2018-04-04 DIAGNOSIS — Z0000 Encounter for general adult medical examination without abnormal findings: Secondary | ICD-10-CM | POA: Diagnosis not present

## 2018-04-04 DIAGNOSIS — M419 Scoliosis, unspecified: Secondary | ICD-10-CM

## 2018-04-04 LAB — POCT URINALYSIS DIP (CLINITEK)
Bilirubin, UA: NEGATIVE
Blood, UA: NEGATIVE
Glucose, UA: NEGATIVE mg/dL
Ketones, POC UA: NEGATIVE mg/dL
Leukocytes, UA: NEGATIVE
Nitrite, UA: NEGATIVE
PH UA: 6 (ref 5.0–8.0)
Spec Grav, UA: >=1.03 — AB (ref 1.010–1.025)
Urobilinogen, UA: 0.2 E.U./dL

## 2018-04-04 MED ORDER — FLUCONAZOLE 150 MG PO TABS: 150.0000 mg | ORAL_TABLET | Freq: Every day | ORAL | 2 refills | Status: DC

## 2018-04-04 MED ORDER — METRONIDAZOLE 500 MG PO TABS: 500.0000 mg | ORAL_TABLET | Freq: Two times a day (BID) | ORAL | 0 refills | Status: AC

## 2018-04-04 NOTE — Telephone Encounter (Signed)
Deanna Hughes, CMA  

## 2018-04-04 NOTE — Progress Notes (Signed)
Health Maintenance reviewed - currently she is up to date  Immunization History  Administered Date(s) Administered  . Influenza,inj,Quad PF,6+ Mos 10/26/2015  . Influenza-Unspecified 09/22/2016, 10/05/2017   Last Pap smear: 09/2017 clear of maglinancy Last mammogram: n/a Last colonoscopy: n/a Last DEXA: n/a Dentist: 2 twice yearly Ophtho: not seeing anyone-20/20 on vision check in office Exercise: not currently working out  Other doctors caring for patient include:  Patient Care Team: Perlie Mayo, NP as PCP - General (Family Medicine) Bary Castilla, Forest Gleason, MD as Consulting Physician (General Surgery) Christin Fudge, CNM as Referring Physician (Certified Nurse Midwife)  End of Life Discussion:  Patient does not have a living will and medical power of attorney  Code Status: Full   Subjective:   HPI  Deanna Hughes is a 28 y.o. female who presents for annual wellness visit and follow-up on chronic medical conditions.  She has the following concerns: Possible UTI or vaginal infection secondary to recent use of meds for bronchitis. Reports having increased urination, itching, vaginal discharge, odor with discharge.  Denies having any burning, fevers, chills.  Concerned for DM as she reports excess thrist, urination, and hunger-with Nausea. And frequent infections.  Reports family history of having diabetes.  Is unsure why all of a sudden she is starting to feel like this.  Is overweight.  BMI is 31.  Not currently exercising or watching what she eats really.  Back/Shoulder Pain on the Right side, due to history of scoliosis.  Unsure why this is getting worse right now.  Reports that she is a very tender over the right scapula area.  Originally the health department was response to refer her to the orthopedist.  But they have not so she is asking if we can do so.  Denies having any reduction and decreased range of motion.  Pain with movement.  Denies having any  injuries, trauma to the area.  Movement aggravates it.  Started to have continuous pattern of tenderness and pain.  Having increase rectal pain secondary to hemorrhoids.  Has had these since she had her son.  Have become worse over the last several weeks.  Has been using the Preparation H that we discussed at previous visit.  Now they are starting to bleed.  These are external in nature.  Would like to see about having them removed to reduce.   Reports having a left over cough from bronchitis, but that this is improving. Denies having any chest pain, shortness of breath, wheezing.    Overall she is doing well in the office today, aside from above.  Past Medical, Surgical, Social History, Allergies, and Medications have been Reviewed.   Past Medical History:  Diagnosis Date  . BV (bacterial vaginosis) 04/02/2015  . Chlamydia    treated 6/6  . Chronic back pain   . Chronic headaches    MIGRAINES  . Contraceptive management 01/25/2013  . Depression   . Encounter for Nexplanon removal 02/27/2015  . Irregular bleeding 06/03/2013  . Miscarriage 03/27/2013  . Nexplanon insertion 04/11/2013   nexplanon inserted 04/11/13 remove 3/32/18  . Right sided abdominal pain   . Scoliosis   . Seasonal allergies 07/03/2012  . Sinus infection 08/27/2013  . Supervision of normal pregnancy 08/27/2015    [facility] at   7+5 weeks  FOB  Dating By  LMP  Pap  GC/CT Initial:  36+wks: Genetic Screen NT/IT:  CF screen  Anatomic [place]  Flu vaccine  Tdap Recommended ~ 28wks Glucose Screen  2 hr GBS  Feed Preference  Contraception  Circumcision  Childbirth Classes  Pediatrician    . Threatened abortion in early pregnancy 03/20/2013  . Vaginal discharge 10/03/2012  . Yeast infection 07/03/2012   Past Surgical History:  Procedure Laterality Date  . INGUINAL HERNIA REPAIR Right 04/19/2017   Procedure: HERNIA REPAIR INGUINAL ADULT;  Surgeon: Robert Bellow, MD;  Location: [facility] ORS;  Service:  General;  Laterality: Right;  . WISDOM TOOTH EXTRACTION     Current Outpatient Medications on File Prior to Visit  Medication Sig Dispense Refill  . acetaminophen (TYLENOL) 500 MG tablet Take 1,000 mg by mouth 3 (three) times daily as needed for moderate pain or headache.     Marland Kitchen amitriptyline (ELAVIL) 10 MG tablet Take 1 tablet (10 mg total) by mouth at bedtime. 30 tablet 0  . dronabinol (MARINOL) 2.5 MG capsule Take 1 capsule (2.5 mg total) by mouth 2 (two) times daily before lunch and supper. 60 capsule 3  . etonogestrel (NEXPLANON) 68 MG IMPL implant 1 each once by Subdermal route.    . fluticasone (FLONASE) 50 MCG/ACT nasal spray Place 2 sprays into both nostrils daily as needed for allergies.   2  . megestrol (MEGACE) 40 MG tablet 3 tablets a day for 5 days, 2 tablets a day for 5 days then 1 tablet daily 45 tablet 3  . Multiple Vitamin (MULTIVITAMIN) tablet Take 1 tablet by mouth daily.    . promethazine-dextromethorphan (PROMETHAZINE-DM) 6.25-15 MG/5ML syrup Take 5 mLs by mouth 4 (four) times daily as needed. May cause drowsiness 118 mL 0   No current facility-administered medications on file prior to visit.       Review Of Systems  Review of Systems  Constitutional: Positive for appetite change and fatigue. Negative for activity change, chills and fever.  HENT: Negative for congestion, ear pain, hearing loss, rhinorrhea, sinus pressure, sinus pain, sore throat, tinnitus, trouble swallowing and voice change.   Eyes: Negative.   Respiratory: Positive for cough. Negative for chest tightness, shortness of breath and wheezing.   Cardiovascular: Negative for chest pain and palpitations.  Gastrointestinal: Positive for anal bleeding, nausea and rectal pain.       Hemorrhoids-getting worse   Endocrine: Positive for polyphagia and polyuria.  Genitourinary: Positive for dysuria, frequency, vaginal discharge and vaginal pain. Negative for decreased urine volume, flank pain, hematuria,  menstrual problem, pelvic pain and urgency.       Itching  Musculoskeletal: Positive for back pain.  Skin: Negative for rash and wound.  Neurological: Negative for dizziness and headaches.  Hematological: Negative.   Psychiatric/Behavioral: Negative.   All other systems reviewed and are negative.   Objective:   PHYSICAL EXAM:  BP 112/74 (BP Location: Left Arm, Patient Position: Sitting, Cuff Size: Normal)   Pulse 99   Temp 98.5 F (36.9 C)   Ht 5\' 2"  (1.575 m)   Wt 170 lb 1.3 oz (77.1 kg)   LMP 03/27/2018 (Exact Date)   SpO2 99%   Breastfeeding No   BMI 31.11 kg/m     Physical Exam Vitals signs and nursing note reviewed.  Constitutional:      General: She is not in acute distress.    Appearance: Normal appearance. She is obese. She is not ill-appearing.  HENT:     Head: Normocephalic.     Right Ear: Hearing, tympanic membrane,  ear canal and external ear normal.     Left Ear: Hearing, tympanic membrane, ear canal and external ear normal.     Ears:     Weber exam findings: does not lateralize.    Nose: Nose normal.     Mouth/Throat:     Lips: Pink.     Mouth: Mucous membranes are moist.     Pharynx: Oropharynx is clear. Uvula midline.  Eyes:     General: Lids are normal.        Right eye: No discharge.        Left eye: No discharge.     Extraocular Movements: Extraocular movements intact.     Conjunctiva/sclera: Conjunctivae normal.     Pupils: Pupils are equal, round, and reactive to light.  Neck:     Musculoskeletal: Full passive range of motion without pain, normal range of motion and neck supple.     Thyroid: No thyroid mass, thyromegaly or thyroid tenderness.  Cardiovascular:     Rate and Rhythm: Regular rhythm. Tachycardia present.  No extrasystoles are present.    Pulses: Normal pulses.     Heart sounds: Normal heart sounds, S1 normal and S2 normal.  Pulmonary:     Effort: Pulmonary effort is normal.     Breath sounds: Normal breath sounds.  Abdominal:      General: Bowel sounds are normal. There is no distension.     Palpations: Abdomen is soft. There is no mass.     Tenderness: There is no abdominal tenderness. There is no right CVA tenderness, left CVA tenderness, guarding or rebound.     Hernia: No hernia is present.  Genitourinary:    General: Normal vulva.     Exam position: Supine.     Pubic Area: No rash.      Labia:        Right: No rash.        Left: No rash or tenderness.      Vagina: Vaginal discharge and tenderness present. No bleeding.     Comments: Noted white-grayish discharge, malodorous. Tenderness on bimanual exam. no noted irritation to the labia walls. Musculoskeletal: Normal range of motion.     Right lower leg: No edema.     Left lower leg: No edema.  Lymphadenopathy:     Cervical: No cervical adenopathy.  Skin:    General: Skin is warm and dry.     Capillary Refill: Capillary refill takes less than 2 seconds.  Neurological:     General: No focal deficit present.     Mental Status: She is alert and oriented to person, place, and time.     GCS: GCS eye subscore is 4. GCS verbal subscore is 5. GCS motor subscore is 6.     Cranial Nerves: Cranial nerves are intact.     Sensory: Sensation is intact.     Motor: Motor function is intact.     Coordination: Coordination is intact.     Gait: Gait is intact.     Deep Tendon Reflexes: Reflexes are normal and symmetric.  Psychiatric:        Attention and Perception: Attention normal.        Mood and Affect: Mood normal.        Speech: Speech normal.        Behavior: Behavior normal. Behavior is cooperative.        Thought Content: Thought content normal.        Cognition and Memory: Cognition normal.  Judgment: Judgment normal.      Depression Screening  Depression screen [facility] 2/9 04/04/2018 03/13/2018 01/24/2018 09/28/2017  Decreased Interest 0 0 0 0  Down, Depressed, Hopeless 0 0 0 0  PHQ - 2 Score 0 0 0 0  Altered sleeping 0 - - -  Tired, decreased  energy 1 - - -  Change in appetite 1 - - -  Feeling bad or failure about yourself  0 - - -  Trouble concentrating 0 - - -  Moving slowly or fidgety/restless 0 - - -  Suicidal thoughts 0 - - -  PHQ-9 Score 2 - - -        Assessment & Plan:  1. Well female exam without gynecological exam Discussed monthly self breast exams and yearly mammograms; at least 30 minutes of aerobic activity at least 5 days/week and weight-bearing exercise 2x/week; proper sunscreen use reviewed; healthy diet, including goals of calcium and vitamin D intake and alcohol recommendations (less than or equal to 1 drink/day) reviewed; regular seatbelt use; changing batteries in smoke detectors.  Immunization recommendations discussed.     2. Urinary frequency Reports that she has had an increase in urinary frequency.  Has also been excessively thirsty.  Urinalysis was negative for infection.  But did have some protein in it.  Due to family history of diabetes we will check an A1c, and her kidney function.  Advised for her to keep continuing hydration for water.  - POCT URINALYSIS DIP (CLINITEK) - Hemoglobin A1c  3. Vaginal discharge Signs and symptoms today in the office present for vaginal infection.  Will provide her with medication for this.  Advised her to follow-up should symptoms not be resolved post completion of medication.  Educated about proper vaginal hygiene.  Provided with handout.  - fluconazole (DIFLUCAN) 150 MG tablet; Take 1 tablet (150 mg total) by mouth daily.  Dispense: 1 tablet; Refill: 2  4. BV (bacterial vaginosis) Recent history of having bacterial vaginosis.  Reported that she did take all of her Flagyl.  Signs and symptoms today in the office are consistent with this.  Will send out swab for culture.  Provided with Flagyl today in the office.  Advised to continue the full course of medication.  And provided with proper vaginal hygiene handout.  Encouraged her to get probiotics, as she reports  that she gets these infections often.  Also will be checking A1c to make sure that she does not have diabetes.  - metroNIDAZOLE (FLAGYL) 500 MG tablet; Take 1 tablet (500 mg total) by mouth 2 (two) times daily for 7 days.  Dispense: 14 tablet; Refill: 0  5. Hemorrhoids, unspecified hemorrhoid type Current history of having hemorrhoids secondary to pregnancy.  These of getting worse.  Now she is having bleeding from them.  Would like to have a referral for possible removal or reduction of these. Appreciate collaboration in her care.  Advised to do Epsom/witch hazel sitz bath, Tucks pads, use of Preparation H while she is waiting for appointment.  - Ambulatory referral to Gastroenterology  6. Scoliosis, unspecified scoliosis type, unspecified spinal region History of scoliosis.  Unsure of what degree that she has it.  She reports that it is causing her more back and shoulder pain.  Today she has tenderness over the right scapula and under the right scapula.  Noted degree of right shoulder being higher secondary to the curvature.  Will refer to orthopedics to help see if possible brace could help with this.  Though she is past her growing stages. Appreciate collaboration in her care.  - Ambulatory referral to Orthopedic Surgery  7. Obesity (BMI 30-39.9) Educated extensively about monitoring her diet, and getting on a regular exercise regime.  Advised for her to try to take control over her health right now.  Will be drawing labs to see how her kidney, liver, A1c are.  We will follow-up pending results.   - CBC with Differential/Platelet - COMPLETE METABOLIC PANEL WITH GFR - Hemoglobin A1c    The patient's weight, height, BMI, and visual acuity have been recorded in the chart.  I have made referrals, counseling, and provided education to the patient based on review of the above and I have provided the patient with a written personalized care plan for preventive services.     Perlie Mayo,  NP   04/04/2018

## 2018-04-04 NOTE — Patient Instructions (Addendum)
Thank you for coming into the office today. I appreciate the opportunity to provide you with the care for your health and wellness. Today we discussed: your overall health. Please read through this info I have included for you today.  Go next door for labs-I will update you on results.  Please pick up your prescriptions at the pharmacy.  Continue use of pre-H, tucks pads, and flushable wipes. Can use epsom salt/with hazel baths (shallow water) to help with swelling until appt with GI is in.   Try and stretch your back and shoulder area to help prevent muscle-see handout at end of this.  HEALTH MAINTENANCE RECOMMENDATIONS:  It is recommended that you get at least 30 minutes of aerobic exercise at least 5 days/week (for weight loss, you may need as much as 60-90 minutes). This can be any activity that gets your heart rate up. This can be divided in 10-15 minute intervals if needed, but try and build up your endurance at least once a week.  Weight bearing exercise is also recommended twice weekly.  Eat a healthy diet with lots of vegetables, fruits and fiber.  "Colorful" foods have a lot of vitamins (ie green vegetables, tomatoes, red peppers, etc).  Limit sweet tea, regular sodas and alcoholic beverages, all of which has a lot of calories and sugar.  Up to 1 alcoholic drink daily may be beneficial for women (unless trying to lose weight, watch sugars).  Drink a lot of water.  Calcium recommendations are 1200-1500 mg daily and vitamin D 1000 IU daily.  This should be obtained from diet and/or supplements (vitamins), and calcium should not be taken all at once, but in divided doses.  Monthly self breast exams and yearly mammograms for women over the age of 103 is recommended.  Sunscreen of at least SPF 30 should be used on all sun-exposed parts of the skin when outside between the hours of 10 am and 4 pm (not just when at beach or pool, but even with exercise, golf, tennis, and yard work!)  Use  a sunscreen that says "broad spectrum" so it covers both UVA and UVB rays, and make sure to reapply every 1-2 hours.  Remember to change the batteries in your smoke detectors when changing your clock times in the spring and fall.  Use your seat belt every time you are in a car, and please drive safely and not be distracted with cell phones and texting while driving.   Healthy vaginal hygiene practices   -  Avoid sleeper pajamas. Nightgowns allow air to circulate.  Sleep without underpants whenever possible.  -  Wear cotton underpants during the day. Double-rinse underwear after washing to avoid residual irritants. Do not use fabric softeners for underwear and swimsuits.  - Avoid tights, leotards, leggings, "skinny" jeans, and other tight-fitting clothing. Skirts and loose-fitting pants allow air to circulate.  - Avoid pantyliners.  Instead use tampons or cotton pads.  - Daily warm bathing is helpful:     - Soak in clean water (no soap) for 10 to 15 minutes. Adding vinegar or baking soda to the water has not been specifically studied and may not be better than clean water alone.      - Use soap to wash regions other than the genital area just before getting out of the tub. Limit use of any soap on genital areas. Use fragance-free soaps.     - Rinse the genital area well and gently pat dry.  Don't  rub.  Hair dryer to assist with drying can be used only if on cool setting.     - Do not use bubble baths or perfumed soaps.  - Do not use any feminine sprays, douches or powders.  These contain chemicals that will irritate the skin.  - If the genital area is tender or swollen, cool compresses may relieve the discomfort. Unscented wet wipes can be used instead of toilet paper for wiping.   - Emollients, such as Vaseline, may help protect skin and can be applied to the irritated area.  - Always remember to wipe front-to-back after bowel movements. Pat dry after urination.  - Do not sit in wet  swimsuits for long periods of time after swimming   Teens need about 9 hours of sleep a night. Younger children need more sleep (10-11 hours a night) and adults need slightly less (7-9 hours each night). 11 Tips to Follow: 1. No caffeine after 3pm: Avoid beverages with caffeine (soda, tea, energy drinks, etc.) especially after 3pm.  2. Don't go to bed hungry: Have your evening meal at least 3 hrs. before going to sleep. It's fine to have a small bedtime snack such as a glass of milk and a few crackers but don't have a big meal.  3. Have a nightly routine before bed: Plan on "winding down" before you go to sleep. Begin relaxing about 1 hour before you go to bed. Try doing a quiet activity such as listening to calming music, reading a book or meditating.  4. Turn off the TV and ALL electronics including video games, tablets, laptops, etc. 1 hour before sleep, and keep them out of the bedroom.  5. Turn off your cell phone and all notifications (new email and text alerts) or even better, leave your phone outside your room while you sleep. Studies have shown that a part of your brain continues to respond to certain lights and sounds even while you're still asleep.  6. Make your bedroom quiet, dark and cool. If you can't control the noise, try wearing earplugs or using a fan to block out other sounds.  7. Practice relaxation techniques. Try reading a book or meditating or drain your brain by writing a list of what you need to do the next day.  8. Don't nap unless you feel sick: you'll have a better night's sleep.  9. Don't smoke, or quit if you do. Nicotine, alcohol, and marijuana can all keep you awake. Talk to your health care provider if you need help with substance use.  10. Most importantly, wake up at the same time every day (or within 1 hour of your usual wake up time) EVEN on the weekends. A regular wake up time promotes sleep hygiene and prevents sleep problems.  11. Reduce exposure to  bright light in the last three hours of the day before going to sleep.  Maintaining good sleep hygiene and having good sleep habits lower your risk of developing sleep problems. Getting better sleep can also improve your concentration and alertness. Try the simple steps in this guide. If you still have trouble getting enough rest, make an appointment with your health care provider.    [place] YOUR HANDS WELL AND FREQUENTLY. AVOID TOUCHING YOUR FACE, UNLESS YOUR HANDS ARE FRESHLY WASHED.   GET FRESH AIR DAILY. STAY HYDRATED WITH WATER.   It was a pleasure to see you and I look forward to continuing to work together on your health and well-being. Please do not  hesitate to call the office if you need care or have questions about your care.  Have a wonderful day and week.  With Gratitude,  Cherly Beach, DNP, AGNP-BC    Back Exercises The following exercises strengthen the muscles that help to support the back. They also help to keep the lower back flexible. Doing these exercises can help to prevent back pain or lessen existing pain. If you have back pain or discomfort, try doing these exercises 2-3 times each day or as told by your health care provider. When the pain goes away, do them once each day, but increase the number of times that you repeat the steps for each exercise (do more repetitions). If you do not have back pain or discomfort, do these exercises once each day or as told by your health care provider. Exercises Single Knee to Chest Repeat these steps 3-5 times for each leg: 1. Lie on your back on a firm bed or the floor with your legs extended. 2. Bring one knee to your chest. Your other leg should stay extended and in contact with the floor. 3. Hold your knee in place by grabbing your knee or thigh. 4. Pull on your knee until you feel a gentle stretch in your lower back. 5. Hold the stretch for 10-30 seconds. 6. Slowly release and straighten your leg. Pelvic Tilt Repeat  these steps 5-10 times: 1. Lie on your back on a firm bed or the floor with your legs extended. 2. Bend your knees so they are pointing toward the ceiling and your feet are flat on the floor. 3. Tighten your lower abdominal muscles to press your lower back against the floor. This motion will tilt your pelvis so your tailbone points up toward the ceiling instead of pointing to your feet or the floor. 4. With gentle tension and even breathing, hold this position for 5-10 seconds. Cat-Cow Repeat these steps until your lower back becomes more flexible: 1. Get into a hands-and-knees position on a firm surface. Keep your hands under your shoulders, and keep your knees under your hips. You may place padding under your knees for comfort. 2. Let your head hang down, and point your tailbone toward the floor so your lower back becomes rounded like the back of a cat. 3. Hold this position for 5 seconds. 4. Slowly lift your head and point your tailbone up toward the ceiling so your back forms a sagging arch like the back of a cow. 5. Hold this position for 5 seconds.  Press-Ups Repeat these steps 5-10 times: 1. Lie on your abdomen (face-down) on the floor. 2. Place your palms near your head, about shoulder-width apart. 3. While you keep your back as relaxed as possible and keep your hips on the floor, slowly straighten your arms to raise the top half of your body and lift your shoulders. Do not use your back muscles to raise your upper torso. You may adjust the placement of your hands to make yourself more comfortable. 4. Hold this position for 5 seconds while you keep your back relaxed. 5. Slowly return to lying flat on the floor.  Bridges Repeat these steps 10 times: 1. Lie on your back on a firm surface. 2. Bend your knees so they are pointing toward the ceiling and your feet are flat on the floor. 3. Tighten your buttocks muscles and lift your buttocks off of the floor until your waist is at almost  the same height as your knees. You should  feel the muscles working in your buttocks and the back of your thighs. If you do not feel these muscles, slide your feet 1-2 inches farther away from your buttocks. 4. Hold this position for 3-5 seconds. 5. Slowly lower your hips to the starting position, and allow your buttocks muscles to relax completely. If this exercise is too easy, try doing it with your arms crossed over your chest. Abdominal Crunches Repeat these steps 5-10 times: 1. Lie on your back on a firm bed or the floor with your legs extended. 2. Bend your knees so they are pointing toward the ceiling and your feet are flat on the floor. 3. Cross your arms over your chest. 4. Tip your chin slightly toward your chest without bending your neck. 5. Tighten your abdominal muscles and slowly raise your trunk (torso) high enough to lift your shoulder blades a tiny bit off of the floor. Avoid raising your torso higher than that, because it can put too much stress on your low back and it does not help to strengthen your abdominal muscles. 6. Slowly return to your starting position. Back Lifts Repeat these steps 5-10 times: 1. Lie on your abdomen (face-down) with your arms at your sides, and rest your forehead on the floor. 2. Tighten the muscles in your legs and your buttocks. 3. Slowly lift your chest off of the floor while you keep your hips pressed to the floor. Keep the back of your head in line with the curve in your back. Your eyes should be looking at the floor. 4. Hold this position for 3-5 seconds. 5. Slowly return to your starting position. Contact a health care provider if:  Your back pain or discomfort gets much worse when you do an exercise.  Your back pain or discomfort does not lessen within 2 hours after you exercise. If you have any of these problems, stop doing these exercises right away. Do not do them again unless your health care provider says that you can. Get help right  away if:  You develop sudden, severe back pain. If this happens, stop doing the exercises right away. Do not do them again unless your health care provider says that you can. This information is not intended to replace advice given to you by your health care provider. Make sure you discuss any questions you have with your health care provider. Document Released: 02/11/2004 Document Revised: 05/09/2017 Document Reviewed: 02/27/2014 Elsevier Interactive Patient Education  2019 [employer].  tightness which might be related to your scoliosis.

## 2018-04-05 LAB — CBC WITH DIFFERENTIAL/PLATELET
Absolute Monocytes: 666 cells/uL (ref 200–950)
Basophils Absolute: 27 cells/uL (ref 0–200)
Basophils Relative: 0.3 %
EOS PCT: 1.4 %
Eosinophils Absolute: 126 cells/uL (ref 15–500)
HCT: 38.4 % (ref 35.0–45.0)
Hemoglobin: 13.2 g/dL (ref 11.7–15.5)
Lymphs Abs: 2934 cells/uL (ref 850–3900)
MCH: 29.8 pg (ref 27.0–33.0)
MCHC: 34.4 g/dL (ref 32.0–36.0)
MCV: 86.7 fL (ref 80.0–100.0)
MPV: 12.1 fL (ref 7.5–12.5)
Monocytes Relative: 7.4 %
Neutro Abs: 5247 cells/uL (ref 1500–7800)
Neutrophils Relative %: 58.3 %
Platelets: 281 10*3/uL (ref 140–400)
RBC: 4.43 10*6/uL (ref 3.80–5.10)
RDW: 12.5 % (ref 11.0–15.0)
Total Lymphocyte: 32.6 %
WBC: 9 10*3/uL (ref 3.8–10.8)

## 2018-04-05 LAB — COMPLETE METABOLIC PANEL WITH GFR
AG Ratio: 1.8 (calc) (ref 1.0–2.5)
ALT: 17 U/L (ref 6–29)
AST: 13 U/L (ref 10–30)
Albumin: 4.2 g/dL (ref 3.6–5.1)
Alkaline phosphatase (APISO): 66 U/L (ref 31–125)
BUN: 10 mg/dL (ref 7–25)
CO2: 30 mmol/L (ref 20–32)
Calcium: 9 mg/dL (ref 8.6–10.2)
Chloride: 104 mmol/L (ref 98–110)
Creat: 0.63 mg/dL (ref 0.50–1.10)
GFR, Est African American: 144 mL/min/{1.73_m2} (ref >=60)
GFR, Est Non African American: 125 mL/min/{1.73_m2} (ref >=60)
GLUCOSE: 95 mg/dL (ref 65–139)
Globulin: 2.3 g/dL (calc) (ref 1.9–3.7)
Potassium: 3.6 mmol/L (ref 3.5–5.3)
Sodium: 140 mmol/L (ref 135–146)
Total Bilirubin: 0.7 mg/dL (ref 0.2–1.2)
Total Protein: 6.5 g/dL (ref 6.1–8.1)

## 2018-04-05 LAB — URINE CYTOLOGY ANCILLARY ONLY
Bacterial vaginitis: POSITIVE — AB
Candida vaginitis: NEGATIVE

## 2018-04-05 LAB — HEMOGLOBIN A1C
Hgb A1c MFr Bld: 5.3 % of total Hgb (ref <5.7)
Mean Plasma Glucose: 105 (calc)
eAG (mmol/L): 5.8 (calc)

## 2018-04-05 NOTE — Telephone Encounter (Signed)
Results are indicted for what you are being treated for so continue the medications ordered.  Please follow up if you have a return of symptoms.

## 2018-04-05 NOTE — Progress Notes (Signed)
Labs look good. A1c is in normal range. Kidneys and Liver look good as well. Stay hydrated and eat a well balanced diet.

## 2018-04-06 ENCOUNTER — Encounter: Payer: Self-pay | Admitting: Internal Medicine

## 2018-04-09 ENCOUNTER — Other Ambulatory Visit: Payer: Self-pay

## 2018-04-09 DIAGNOSIS — G43719 Chronic migraine without aura, intractable, without status migrainosus: Secondary | ICD-10-CM

## 2018-04-09 MED ORDER — AMITRIPTYLINE HCL 10 MG PO TABS: 10.0000 mg | ORAL_TABLET | Freq: Every day | ORAL | 0 refills | Status: DC

## 2018-04-17 ENCOUNTER — Encounter: Payer: Self-pay | Admitting: Family Medicine

## 2018-05-10 ENCOUNTER — Telehealth: Payer: Self-pay | Admitting: Family Medicine

## 2018-05-10 ENCOUNTER — Encounter: Payer: Self-pay | Admitting: Family Medicine

## 2018-05-10 NOTE — Telephone Encounter (Signed)
I called and LMOM that I made her appt for Tuesday am.  Gave Abby the information for referral for Dr [facility] in [place].

## 2018-05-11 ENCOUNTER — Telehealth: Payer: Self-pay

## 2018-05-11 DIAGNOSIS — M419 Scoliosis, unspecified: Secondary | ICD-10-CM

## 2018-05-11 NOTE — Telephone Encounter (Signed)
Referral entered per provider request

## 2018-05-15 ENCOUNTER — Encounter: Payer: Self-pay | Admitting: Family Medicine

## 2018-05-15 ENCOUNTER — Other Ambulatory Visit: Payer: Self-pay

## 2018-05-15 ENCOUNTER — Ambulatory Visit (INDEPENDENT_AMBULATORY_CARE_PROVIDER_SITE_OTHER): Payer: 59 | Admitting: Family Medicine

## 2018-05-15 DIAGNOSIS — M419 Scoliosis, unspecified: Secondary | ICD-10-CM | POA: Diagnosis not present

## 2018-05-15 DIAGNOSIS — M62838 Other muscle spasm: Secondary | ICD-10-CM

## 2018-05-15 MED ORDER — CYCLOBENZAPRINE HCL 5 MG PO TABS: 5.0000 mg | ORAL_TABLET | Freq: Three times a day (TID) | ORAL | 0 refills | Status: DC | PRN

## 2018-05-15 NOTE — Progress Notes (Signed)
Virtual Visit via Video Note  I connected with Deanna Hughes on 05/15/18 at  8:20 AM EDT by a video enabled telemedicine application and verified that I am speaking with the correct person using two identifiers.   I discussed the limitations of evaluation and management by telemedicine and the availability of in person appointments. The patient expressed understanding and agreed to proceed.  Location of Patient: Home Location of Provider: Telehealth Consent was obtain for visit to be over via telehealth.  History of Present Illness: Ms. hanline is a 28 year old female patient of mine.  She presents today secondary to having continued back, shoulder, and now new onset neck spasm and pain.  Secondary to a history of scoliosis.  She is unsure as to why it is getting worse at this time.  Tried to refer her to the orthopedist but they do not see individuals with scoliosis as per her.  I have now placed referral for [facility] spine and scoliosis specialist.  Awaiting appointment but she is having discomfort and pain, ans wanted to see about getting help for it while she waited. She has tried to take 800mg  ibuprofen without relief.  She reports that she is tried physical therapy in the past but it she thinks that it made it worse.  She was supposed to wear a brace when she was younger but she did not.  Denies having any reduction or decreased range of motion.  She does have pain with movement.  Denies any injuries or trauma to the area.  Movement actually aggravates the discomfort.  She started having right scapula pain at the last visit I saw her back in March 18.  She reports that she is had to call out of work several days in a row last week secondary to the increase in discomfort and pain.  She denies any cough, fever, chills, shortness of breath, headaches, any other signs and symptoms of infection or illness at this time.  Past Medical, Surgical, Social History, Allergies, and Medications have been  Reviewed.  Review of Systems  HENT: Negative.   Eyes: Negative.   Respiratory: Negative.   Cardiovascular: Negative.   Gastrointestinal: Negative.   Endocrine: Negative.   Genitourinary: Negative.   Musculoskeletal: Positive for back pain and neck pain.  Skin: Negative.   Allergic/Immunologic: Negative.   Neurological: Negative.   Hematological: Negative.   Psychiatric/Behavioral: Negative.   All other systems reviewed and are negative.   Observations/Objective: Physical Exam Constitutional:      Appearance: Normal appearance. She is well-developed, well-groomed and normal weight.  HENT:     Head: Normocephalic and atraumatic.     Right Ear: External ear normal.     Left Ear: External ear normal.     Nose: Nose normal.  Eyes:     General: No scleral icterus.       Right eye: No discharge.        Left eye: No discharge.     Conjunctiva/sclera: Conjunctivae normal.  Neck:     Musculoskeletal: Normal range of motion.  Pulmonary:     Effort: Pulmonary effort is normal.  Musculoskeletal: Normal range of motion.  Neurological:     Mental Status: She is alert and oriented to person, place, and time.  Psychiatric:        Attention and Perception: Attention and perception normal.        Mood and Affect: Mood and affect normal.        Speech: Speech normal.  Behavior: Behavior normal. Behavior is cooperative.        Thought Content: Thought content normal.        Cognition and Memory: Cognition and memory normal.        Judgment: Judgment normal.     Assessment and Plan: 1. Scoliosis, unspecified scoliosis type, unspecified spinal region Deteriorated, History of scoliosis.  Unsure of what degree that she has it.  She reports that it is causing her more upper back, neck and shoulder pain.  Today she reports tenderness over the right scapula and under the right scapula, as well as neck pain/spasms. 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  Tried referral to ortho, but they do not deal with scoliosis, so will refer to [facility] Scoliosis specialist. Do think a brace of some kind might help. Appreciate collaboration in her care. Will provide back care exercises in AVS again.  Have added a skeletal muscle relaxant to see if this can help her while she is awaiting her scoliosis specialist appointment.  Reviewed side effects, risks and benefits of medication.   Patient acknowledged agreement and understanding of the plan.    - cyclobenzaprine (FLEXERIL) 5 MG tablet; Take 1 tablet (5 mg total) by mouth 3 (three) times daily as needed for muscle spasms.  Dispense: 30 tablet; Refill: 0  2. Neck muscle spasm See above. Provided with neck exercises in AVS  - cyclobenzaprine (FLEXERIL) 5 MG tablet; Take 1 tablet (5 mg total) by mouth 3 (three) times daily as needed for muscle spasms.  Dispense: 30 tablet; Refill: 0   Follow Up Instructions: AVS printed and mailed    I discussed the assessment and treatment plan with the patient. The patient was provided an opportunity to ask questions and all were answered. The patient agreed with the plan and demonstrated an understanding of the instructions.   The patient was advised to call back or seek an in-person evaluation if the symptoms worsen or if the condition fails to improve as anticipated.  I provided 10 minutes of non-face-to-face time during this encounter.   Perlie Mayo, NP

## 2018-05-15 NOTE — Patient Instructions (Addendum)
Thank you for completing your visit via WebEx. I appreciate the opportunity to provide you with the care for your health and wellness. Today we discussed: Neck spasms, scoliosis back pain.  You report that they NSAIDs, ibuprofen 800 mg or not really doing much for you.  This is a frontline medication to help with this discomfort that you are experiencing.  In addition to using this ibuprofen we can add a skeletal muscle relaxant.  Sometimes these medications can help with the muscles that are bunched up or pulling secondary to the curvature of the spine.  Especially if you are having discomfort that is going up into your neck.  Try to use heating pads, ice if this helps as well.  Continue to take the ibuprofen but make sure you are while monitoring for gastric upset GI upset risk of bleeding is higher when you are on this medication and risk of ulcers in your stomach as well.  I have called in the Flexeril, 5 mg to be taken 3 times daily as needed.  Please make sure you do not operate heavy machinery and or try to work with your first dose of this medication please see how your body adjust and response to it.  Let me know if this medication works for you.  Please let me know if you cannot get into the scoliosis and spine physician before the end of next month.  I would strongly consider being placed back into the brace if the specialist recommends this.  I would also consider doing physical therapy, massage as well to help with alignment and proper mechanics secondary to the curvature of your spine.  Put you in for follow-up in 1 month to check back in and see how you are doing.  If you do not need this appointment please feel free to cancel it.  [place] YOUR HANDS WELL AND FREQUENTLY. AVOID TOUCHING YOUR FACE, UNLESS YOUR HANDS ARE FRESHLY WASHED.  GET FRESH AIR DAILY. STAY HYDRATED WITH WATER.   It was a pleasure to see you and I look forward to continuing to work together on your health  and well-being. Please do not hesitate to call the office if you need care or have questions about your care.  Have a wonderful day and week. With Gratitude, Cherly Beach, DNP, AGNP-BC   Back Exercises If you have pain in your back, do these exercises 2-3 times each day or as told by your doctor. When the pain goes away, do the exercises once each day, but repeat the steps more times for each exercise (do more repetitions). If you do not have pain in your back, do these exercises once each day or as told by your doctor. Exercises Single Knee to Chest Do these steps 3-5 times in a row for each leg: 1. Lie on your back on a firm bed or the floor with your legs stretched out. 2. Bring one knee to your chest. 3. Hold your knee to your chest by grabbing your knee or thigh. 4. Pull on your knee until you feel a gentle stretch in your lower back. 5. Keep doing the stretch for 10-30 seconds. 6. Slowly let go of your leg and straighten it. Pelvic Tilt Do these steps 5-10 times in a row: 1. Lie on your back on a firm bed or the floor with your legs stretched out. 2. Bend your knees so they point up to the ceiling. Your feet should be flat on the floor. 3.  Tighten your lower belly (abdomen) muscles to press your lower back against the floor. This will make your tailbone point up to the ceiling instead of pointing down to your feet or the floor. 4. Stay in this position for 5-10 seconds while you gently tighten your muscles and breathe evenly. Cat-Cow Do these steps until your lower back bends more easily: 1. Get on your hands and knees on a firm surface. Keep your hands under your shoulders, and keep your knees under your hips. You may put padding under your knees. 2. Let your head hang down, and make your tailbone point down to the floor so your lower back is round like the back of a cat. 3. Stay in this position for 5 seconds. 4. Slowly lift your head and make your tailbone point up to the  ceiling so your back hangs low (sags) like the back of a cow. 5. Stay in this position for 5 seconds.  Press-Ups Do these steps 5-10 times in a row: 1. Lie on your belly (face-down) on the floor. 2. Place your hands near your head, about shoulder-width apart. 3. While you keep your back relaxed and keep your hips on the floor, slowly straighten your arms to raise the top half of your body and lift your shoulders. Do not use your back muscles. To make yourself more comfortable, you may change where you place your hands. 4. Stay in this position for 5 seconds. 5. Slowly return to lying flat on the floor.  Bridges Do these steps 10 times in a row: 1. Lie on your back on a firm surface. 2. Bend your knees so they point up to the ceiling. Your feet should be flat on the floor. 3. Tighten your butt muscles and lift your butt off of the floor until your waist is almost as high as your knees. If you do not feel the muscles working in your butt and the back of your thighs, slide your feet 1-2 inches farther away from your butt. 4. Stay in this position for 3-5 seconds. 5. Slowly lower your butt to the floor, and let your butt muscles relax. If this exercise is too easy, try doing it with your arms crossed over your chest. Belly Crunches Do these steps 5-10 times in a row: 1. Lie on your back on a firm bed or the floor with your legs stretched out. 2. Bend your knees so they point up to the ceiling. Your feet should be flat on the floor. 3. Cross your arms over your chest. 4. Tip your chin a little bit toward your chest but do not bend your neck. 5. Tighten your belly muscles and slowly raise your chest just enough to lift your shoulder blades a tiny bit off of the floor. 6. Slowly lower your chest and your head to the floor. Back Lifts Do these steps 5-10 times in a row: 1. Lie on your belly (face-down) with your arms at your sides, and rest your forehead on the floor. 2. Tighten the muscles in  your legs and your butt. 3. Slowly lift your chest off of the floor while you keep your hips on the floor. Keep the back of your head in line with the curve in your back. Look at the floor while you do this. 4. Stay in this position for 3-5 seconds. 5. Slowly lower your chest and your face to the floor. Contact a doctor if:  Your back pain gets a lot worse when you  do an exercise.  Your back pain does not lessen 2 hours after you exercise. If you have any of these problems, stop doing the exercises. Do not do them again unless your doctor says it is okay. Get help right away if:  You have sudden, very bad back pain. If this happens, stop doing the exercises. Do not do them again unless your doctor says it is okay. This information is not intended to replace advice given to you by your health care provider. Make sure you discuss any questions you have with your health care provider. Document Released: 02/05/2010 Document Revised: 09/27/2017 Document Reviewed: 02/27/2014 Elsevier Interactive Patient Education  2019 [employer]      Neck Exercises Neck exercises can be important for many reasons:  They can help you to improve and maintain flexibility in your neck. This can be especially important as you age.  They can help to make your neck stronger. This can make movement easier.  They can reduce or prevent neck pain.  They may help your upper back. Ask your health care provider which neck exercises would be best for you. Exercises to improve neck flexibility Neck stretch Repeat this exercise 3-5 times. 1. Do this exercise while standing or while sitting in a chair. 2. Place your feet flat on the floor, shoulder-width apart. 3. Slowly turn your head to the right. Turn it all the way to the right so you can look over your right shoulder. Do not tilt or tip your head. 4. Hold this position for 10-30 seconds. 5. Slowly turn your head to the left, to look over your left  shoulder. 6. Hold this position for 10-30 seconds.  Neck retraction Repeat this exercise 8-10 times. Do this 3-4 times a day or as told by your health care provider. 1. Do this exercise while standing or while sitting in a sturdy chair. 2. Look straight ahead. Do not bend your neck. 3. Use your fingers to push your chin backward. Do not bend your neck for this movement. Continue to face straight ahead. If you are doing the exercise properly, you will feel a slight sensation in your throat and a stretch at the back of your neck. 4. Hold the stretch for 1-2 seconds. Relax and repeat. Exercises to improve neck strength Neck press Repeat this exercise 10 times. Do it first thing in the morning and right before bed or as told by your health care provider. 1. Lie on your back on a firm bed or on the floor with a pillow under your head. 2. Use your neck muscles to push your head down on the pillow and straighten your spine. 3. Hold the position as well as you can. Keep your head facing up and your chin tucked. 4. Slowly count to 5 while holding this position. 5. Relax for a few seconds. Then repeat. Isometric strengthening Do a full set of these exercises 2 times a day or as told by your health care provider. 1. Sit in a supportive chair and place your hand on your forehead. 2. Push forward with your head and neck while pushing back with your hand. Hold for 10 seconds. 3. Relax. Then repeat the exercise 3 times. 4. Next, do thesequence again, this time putting your hand against the back of your head. Use your head and neck to push backward against the hand pressure. 5. Finally, do the same exercise on either side of your head, pushing sideways against the pressure of your hand. Prone  head lifts Repeat this exercise 5 times. Do this 2 times a day or as told by your health care provider. 1. Lie face-down, resting on your elbows so that your chest and upper back are raised. 2. Start with your head  facing downward, near your chest. Position your chin either on or near your chest. 3. Slowly lift your head upward. Lift until you are looking straight ahead. Then continue lifting your head as far back as you can stretch. 4. Hold your head up for 5 seconds. Then slowly lower it to your starting position. Supine head lifts Repeat this exercise 8-10 times. Do this 2 times a day or as told by your health care provider. 1. Lie on your back, bending your knees to point to the ceiling and keeping your feet flat on the floor. 2. Lift your head slowly off the floor, raising your chin toward your chest. 3. Hold for 5 seconds. 4. Relax and repeat. Scapular retraction Repeat this exercise 5 times. Do this 2 times a day or as told by your health care provider. 1. Stand with your arms at your sides. Look straight ahead. 2. Slowly pull both shoulders backward and downward until you feel a stretch between your shoulder blades in your upper back. 3. Hold for 10-30 seconds. 4. Relax and repeat. Contact a health care provider if:  Your neck pain or discomfort gets much worse when you do an exercise.  Your neck pain or discomfort does not improve within 2 hours after you exercise. If you have any of these problems, stop exercising right away. Do not do the exercises again unless your health care provider says that you can. Get help right away if:  You develop sudden, severe neck pain. If this happens, stop exercising right away. Do not do the exercises again unless your health care provider says that you can. This information is not intended to replace advice given to you by your health care provider. Make sure you discuss any questions you have with your health care provider. Document Released: 12/15/2014 Document Revised: 05/09/2017 Document Reviewed: 07/14/2014 Elsevier Interactive Patient Education  2019 [employer].

## 2018-05-15 NOTE — Progress Notes (Signed)
O-pain has gotten worse in last 2-3 months L-back (whole back but middle upper right and left hurt the worst) D-2-3 months the pain has gotten worse  C-sharp throbbing pain A-sitting up or working all day R-nothing relieves it T-no certain time of the day it hurts, it just hurts all the time S-9/10

## 2018-05-17 ENCOUNTER — Ambulatory Visit (INDEPENDENT_AMBULATORY_CARE_PROVIDER_SITE_OTHER): Payer: 59 | Admitting: Orthopaedic Surgery

## 2018-05-17 ENCOUNTER — Encounter (INDEPENDENT_AMBULATORY_CARE_PROVIDER_SITE_OTHER): Payer: Self-pay | Admitting: Orthopaedic Surgery

## 2018-05-17 ENCOUNTER — Other Ambulatory Visit: Payer: Self-pay

## 2018-05-17 ENCOUNTER — Telehealth (HOSPITAL_COMMUNITY): Payer: Self-pay

## 2018-05-17 VITALS — Ht 62.0 in | Wt 168.0 lb

## 2018-05-17 DIAGNOSIS — M542 Cervicalgia: Secondary | ICD-10-CM | POA: Diagnosis not present

## 2018-05-17 DIAGNOSIS — M4125 Other idiopathic scoliosis, thoracolumbar region: Secondary | ICD-10-CM | POA: Diagnosis not present

## 2018-05-17 DIAGNOSIS — M549 Dorsalgia, unspecified: Secondary | ICD-10-CM

## 2018-05-17 HISTORY — DX: Cervicalgia: M54.2

## 2018-05-17 HISTORY — DX: Dorsalgia, unspecified: M54.9

## 2018-05-17 NOTE — Telephone Encounter (Signed)
Deanna Hughes was contacted today regarding temporary reduction of Outpatient Rehabilitation Services due to concerns for community transmission of COVID-19.  Patient identity was verified.  Assessed if patient needed to be seen in person by clinician (recent fall or acute injury that requires hands on assessment and advice, change in diet order, post-surgical, special cases, etc.).    Patient did have an acute/special need that requires in person visit as her back pain has been worsening over the last 2 months and it is affecting her work/job duties.  Will direct call to front office to schedule appointment with therapist.  PT explained that she would have an in-clinic evaluation but her POC going forward would be determined after that eval whether she would continue with in-clinic visits or continue via telehealth. The patient expressed interest in being contacted for a video visit to continue their plan of care when those services become available. The patient verified that they have a video device and internet access for a telehealth visit.  Patient is aware we can be reached by telephone during limited business hours in the meantime. (include at end of note)   [name] PT, DPT

## 2018-05-17 NOTE — Progress Notes (Signed)
Office Visit Note/orthopedic consultation   Patient: Deanna Hughes           Date of Birth: May 09, 1990           MRN: [id number] Visit Date: 05/17/2018              Requested by: Perlie Mayo, NP [address] [place], [place] [address] PCP: Perlie Mayo, NP   Assessment & Plan: Visit Diagnoses:  1. Mid back pain   2. Neck pain   3. Other idiopathic scoliosis, thoracolumbar region     Plan: We will set patient up for a course of physical therapy.  I plan to recheck her in 6 weeks.  She can continue regular work activity.  We discussed that although she has some mild scoliosis that is not the source of her pain.  She has been on stretching and strengthening program.  Recheck 6 weeks.  Thanks the opportunity to see her in consultation.  Follow-Up Instructions: Return in about 6 weeks (around 06/28/2018).   Orders:  Orders Placed This Encounter  Procedures  . Ambulatory referral to Physical Therapy   No orders of the defined types were placed in this encounter.     Procedures: No procedures performed   Clinical Data: No additional findings.   Subjective: Chief Complaint  Patient presents with  . scoliosis    pain    HPI patient seen for consultation concerning scoliosis thoracic and neck pain requested by Cherly Beach NP in this 28 year old female seen with with worsening upper back pain thoracic region between the scapula also pain in her neck.  States she has scoliosis was told several years ago she needed a brace but did not wear it.  She states pain radiates to her neck she has problems lifting particularly picking up her 61-year-old she is used Tylenol and ibuprofen without relief.  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  Previous her inguinal hernia repair April 2019.  Positive for bronchitis and migraines in the past.  Patient single has children does not smoke or drink.  She does have history of migraines also abdominal pain  patient had x-rays of cervical spine few years ago which were entirely normal including oblique views.  Review of Systems no myelopathic symptoms.  No hand or finger numbness tingling or weakness.  Positive for inguinal hernia repair history of ovarian cyst.  Scoliosis.  Otherwise negative is obtains HPI.   Objective: Vital Signs: Ht 5\' 2"  (1.575 m)   Wt 168 lb (76.2 kg)   BMI 30.73 kg/m   Physical Exam Constitutional:      Appearance: She is well-developed.  HENT:     Head: Normocephalic.     Right Ear: External ear normal.     Left Ear: External ear normal.  Eyes:     Pupils: Pupils are equal, round, and reactive to light.  Neck:     Thyroid: No thyromegaly.     Trachea: No tracheal deviation.  Cardiovascular:     Rate and Rhythm: Normal rate.  Pulmonary:     Effort: Pulmonary effort is normal.  Abdominal:     Palpations: Abdomen is soft.  Skin:    General: Skin is warm and dry.  Neurological:     Mental Status: She is alert and oriented to person, place, and time.  Psychiatric:        Behavior: Behavior normal.     Ortho Exam patient has some  thoracic scoliosis mild scapular asymmetry.  No winging of the scapula no shoulder impingement upper and lower extremity reflexes are 2+.  She complains of discomfort with forward bending rotation of her neck.  Negative Spurling.  Specialty Comments:  No specialty comments available.  Imaging: CLINICAL DATA:  Cough  EXAM: CHEST - 2 VIEW  COMPARISON:  01/21/2018, 03/11/2015  FINDINGS: Mild scoliosis of the spine. No focal consolidation or effusion. Stable cardiomediastinal silhouette. Mild central airways thickening. No pneumothorax.  IMPRESSION: No active cardiopulmonary disease. Mild central airways thickening which may be secondary to inflammatory process.   Electronically Signed   By: Donavan Foil M.D.   On: 03/27/2018 21:40   PMFS History: Patient Active Problem List   Diagnosis Date Noted  .  Neck pain 05/17/2018  . Mid back pain 05/17/2018  . Other idiopathic scoliosis, thoracolumbar region 05/17/2018  . Right ovarian cyst 03/01/2018  . Pain, abdominal, RLQ 03/01/2018  . Vaginal discharge 03/01/2018  . Vaginal odor 03/01/2018  . Right inguinal hernia 12/14/2015  . BV (bacterial vaginosis) 04/02/2015  . Sinus infection 08/27/2013  . Irregular bleeding 06/03/2013  . Seasonal allergies 07/03/2012  . Defecation urgency    Past Medical History:  Diagnosis Date  . BV (bacterial vaginosis) 04/02/2015  . Chlamydia    treated 6/6  . Chronic back pain   . Chronic headaches    MIGRAINES  . Contraceptive management 01/25/2013  . Depression   . Encounter for Nexplanon removal 02/27/2015  . Irregular bleeding 06/03/2013  . Miscarriage 03/27/2013  . Nexplanon insertion 04/11/2013   nexplanon inserted 04/11/13 remove 3/32/18  . Right sided abdominal pain   . Scoliosis   . Seasonal allergies 07/03/2012  . Sinus infection 08/27/2013  . Supervision of normal pregnancy 08/27/2015    [facility] Initiated Care at   7+5 weeks  FOB  Dating By  LMP  Pap  GC/CT Initial:                36+wks: Genetic Screen NT/IT:  CF screen  Anatomic [place]  Flu vaccine  Tdap Recommended ~ 28wks Glucose Screen  2 hr GBS  Feed Preference  Contraception  Circumcision  Childbirth Classes  Pediatrician    . Threatened abortion in early pregnancy 03/20/2013  . Vaginal discharge 10/03/2012  . Yeast infection 07/03/2012    Family History  Problem Relation Age of Onset  . Asthma Mother   . Asthma Sister   . Asthma Brother   . Asthma Daughter   . Diabetes Maternal Grandmother   . Hypertension Maternal Grandmother   . Asthma Maternal Grandmother   . Heart disease Maternal Grandmother   . Stroke Maternal Grandmother   . Hypertension Maternal Grandfather   . Asthma Maternal Grandfather   . Heart disease Maternal Grandfather   . Asthma Son   . Colon cancer Neg Hx     Past Surgical History:  Procedure Laterality  Date  . INGUINAL HERNIA REPAIR Right 04/19/2017   Procedure: HERNIA REPAIR INGUINAL ADULT;  Surgeon: Robert Bellow, MD;  Location: [facility] ORS;  Service: General;  Laterality: Right;  . WISDOM TOOTH EXTRACTION     Social History   Occupational History  . Not on file  Tobacco Use  . Smoking status: Never Smoker  . Smokeless tobacco: Never Used  Substance and Sexual Activity  . Alcohol use: No  . Drug use: No  . Sexual activity: Yes    Birth control/protection: Implant

## 2018-05-18 ENCOUNTER — Encounter: Payer: Self-pay | Admitting: Family Medicine

## 2018-05-22 ENCOUNTER — Ambulatory Visit (HOSPITAL_COMMUNITY): Payer: 59 | Attending: Orthopaedic Surgery | Admitting: Physical Therapy

## 2018-05-22 ENCOUNTER — Other Ambulatory Visit: Payer: Self-pay

## 2018-05-22 ENCOUNTER — Encounter (HOSPITAL_COMMUNITY): Payer: Self-pay | Admitting: Physical Therapy

## 2018-05-22 DIAGNOSIS — M546 Pain in thoracic spine: Secondary | ICD-10-CM | POA: Insufficient documentation

## 2018-05-22 DIAGNOSIS — R29898 Other symptoms and signs involving the musculoskeletal system: Secondary | ICD-10-CM | POA: Insufficient documentation

## 2018-05-22 NOTE — Therapy (Signed)
[place], [place], [address] Phone: [phone]   Fax:  [phone]  Physical Therapy Evaluation  Patient Details  Name: Deanna Hughes MRN: [id number] Date of Birth: [birth year] Referring Provider (PT): Rodell Perna, MD   Encounter Date: 05/22/2018  PT End of Session - 05/22/18 1706    Visit Number  1    Number of Visits  12    Date for PT Re-Evaluation  07/03/18   06/12/18   Authorization Type  [facility]    Authorization Time Period  05/22/18 - 07/03/18    PT Start Time  1133    PT Stop Time  1225    PT Time Calculation (min)  52 min    Activity Tolerance  Patient tolerated treatment well;No increased pain    Behavior During Therapy  WFL for tasks assessed/performed       Past Medical History:  Diagnosis Date  . BV (bacterial vaginosis) 04/02/2015  . Chlamydia    treated 6/6  . Chronic back pain   . Chronic headaches    MIGRAINES  . Contraceptive management 01/25/2013  . Depression   . Encounter for Nexplanon removal 02/27/2015  . Irregular bleeding 06/03/2013  . Miscarriage 03/27/2013  . Nexplanon insertion 04/11/2013   nexplanon inserted 04/11/13 remove 3/32/18  . Right sided abdominal pain   . Scoliosis   . Seasonal allergies 07/03/2012  . Sinus infection 08/27/2013  . Supervision of normal pregnancy 08/27/2015    [facility] Initiated Care at   7+5 weeks  FOB  Dating By  LMP  Pap  GC/CT Initial:                36+wks: Genetic Screen NT/IT:  CF screen  Anatomic [place]  Flu vaccine  Tdap Recommended ~ 28wks Glucose Screen  2 hr GBS  Feed Preference  Contraception  Circumcision  Childbirth Classes  Pediatrician    . Threatened abortion in early pregnancy 03/20/2013  . Vaginal discharge 10/03/2012  . Yeast infection 07/03/2012    Past Surgical History:  Procedure Laterality Date  . INGUINAL HERNIA REPAIR Right 04/19/2017   Procedure: HERNIA REPAIR INGUINAL ADULT;  Surgeon: Robert Bellow, MD;   Location: [facility] ORS;  Service: General;  Laterality: Right;  . WISDOM TOOTH EXTRACTION      There were no vitals filed for this visit.   Subjective Assessment - 05/22/18 1144    Subjective  Patient reported that she has had a sudden increase in her upper back pain for about 4-5 months. 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 She stated that she has been given a diagnosis of scoliosis in the past, but that she has not had recent x-rays and is unsure of what her current scoliotic curve is at this point. She stated that she was supposed to get a brace to correct her scoliosis when she was younger, but that she never got the brace. She stated that her pain can be an aching throbbing pain in the middle of her upper back that sometimes shoots to her neck and both shoulders. She stated that the worst her pain has been is a 10/10 over the last week. She stated that overhead  lifting activities are difficult more so on the right upper extremity. Patient reported that she mostly she has pain but every once in a while she has tingling. She stated she feels like sometimes bowel movements are more difficult since the most recent flare-up of back pain.     Pertinent History  Scoliosis; Inguinal Hernia repair 2019    Limitations  Lifting;Sitting    How long can you sit comfortably?  15-20 minutes    How long can you stand comfortably?  Not limited    How long can you walk comfortably?  Not limited    Patient Stated Goals  To relieve some of the pain    Currently in Pain?  Yes    Pain Score  8     Pain Location  Back    Pain Orientation  Right;Left    Pain Descriptors / Indicators  Throbbing    Pain Type  Chronic pain    Pain Radiating Towards  Up to neck and shoulders    Pain Onset  More than a month  ago    Pain Frequency  Constant    Aggravating Factors   Lifting, sitting    Pain Relieving Factors  Laying down    Effect of Pain on Daily Activities  Moderately affects    Multiple Pain Sites  --   See above        [facility] PT Assessment - 05/22/18 0001      Assessment   Medical Diagnosis  Neck, bilateral shoulder, thoracic back pain, scoliosis    Referring Provider (PT)  Rodell Perna, MD    Onset Date/Surgical Date  --   4-5 months ago increased pain   Hand Dominance  Right    Next MD Visit  --   Unknown   Prior Therapy  Yes, same pain      Precautions   Precautions  None      Restrictions   Weight Bearing Restrictions  No      Balance Screen   Has the patient fallen in the past 6 months  No    Has the patient had a decrease in activity level because of a fear of falling?   No    Is the patient reluctant to leave their home because of a fear of falling?   No      Home Environment   Living Environment  Private residence    Living Arrangements  Alone    Type of [place] Access  Level entry    [place]  One level      Prior Function   Level of Independence  Independent;Independent with basic ADLs    Vocation  Full time employment    Vocation Requirements  Delivering trays      Cognition   Overall Cognitive Status  Within Functional Limits for tasks assessed      Observation/Other Assessments   Focus on Therapeutic Outcomes (FOTO)   49% limited      Posture/Postural Control   Posture/Postural Control  Postural limitations    Postural Limitations  Rounded Shoulders;Forward head    Posture Comments  With forward trunk flexion noted right-sided rib hump      Tone   Assessment Location  Right Upper Extremity;Left Upper Extremity;Right Lower Extremity;Left Lower Extremity      Deep Tendon Reflexes   DTR Assessment Site  Triceps    Triceps DTR  2+      ROM /  Strength   AROM / PROM / Strength  AROM;Strength      AROM   Overall AROM Comments   Cervical and UE AROM WFL bilaterally    AROM Assessment Site  Thoracic    Thoracic Flexion  Painful > than extension. 25% limited. Noted rib hump on the right side    Thoracic Extension  Painful. 50% limited    Thoracic - Right Side Bend  WFL painful    Thoracic - Left Side Bend  WFL painful    Thoracic - Right Rotation  25% limited painful     Thoracic - Left Rotation  25% limited      Strength   Overall Strength  Other (comment)   Patient's BUE WFL, some pain with elbow flex/ext test RUE   Overall Strength Comments  Cervical deep flexor strength testing: 10 seconds before loss of chin tuck      Palpation   Spinal mobility  PA spinal mobility assessment noted decreased thoracic mobility compared to lumbar mobility    Palpation comment  Patient reported tenderness to palpation inferior to left scapula near ribs, and reported tenderness to palpation surrounding cervical spine, noted increased musclar restrictions throughout cervical paraspinals      RUE Tone   RUE Tone  --      LUE Tone   LUE Tone  --      RLE Tone   RLE Tone  --   Ankle clonus absent     LLE Tone   LLE Tone  --   Ankle clonus absent               Objective measurements completed on examination: See above findings.              PT Education - 05/22/18 1704    Education Details  Educated patient on examination findings, plan of care, and initial HEP. Used a model to discuss the relationship between the muscles in her neck and back and their affects on her shoulder.     Person(s) Educated  Patient    Methods  Explanation;Handout;Demonstration    Comprehension  Verbalized understanding       PT Short Term Goals - 05/22/18 1730      PT SHORT TERM GOAL #1   Title  Patient will report understanding and regular compliance with HEP in order to improve strength, mobility, and decrease pain.     Time  3    Period  Weeks    Status  New    Target Date  06/12/18      PT SHORT TERM GOAL #2    Title  Patient will report that her upper back pain and radiating pain has not exceeded a 7/10 over the course of a 1 week period indicating improved tolerance to daily activities.     Time  3    Period  Weeks    Status  New    Target Date  06/12/18        PT Long Term Goals - 05/22/18 1740      PT LONG TERM GOAL #1   Title  Patient will report that her upper back pain and radiating pain has not exceeded a 2/10 over the course of a 1 week period indicating improved tolerance to daily activities.     Time  6    Period  Weeks    Status  New    Target Date  07/03/18      PT LONG  TERM GOAL #2   Title  Patient will demonstrate improvement in deep cervical flexor endurance test by at least 10 seconds indicating improved endurance to improve patient's posture and mechanics while working and lifting during 12-hour shifts.     Time  6    Period  Weeks    Status  New    Target Date  07/03/18      PT LONG TERM GOAL #3   Title  Patient will report ability to sit for at least 45 minutes without increase in pain in order to perform daily activities with increased ease.     Time  6    Period  Weeks    Status  New    Target Date  07/03/18             Plan - 05/22/18 1755    Clinical Impression Statement  Patient is a 28 year-old female who presented to outpatient physical therapy with primary complaint of thoracic back pain and that has been increasing over the last 4-5 months. She reported that the pain can radiate from her upper back into her neck and shoulders. Upon examination, noted patient's cervical spine appeared [facility] as well as bilateral UE. Noted some decreased thoracic ROM. Also patient's bilateral upper extremities strength was [facility], with some reported discomfort during testing of right elbow flexion/extension. With palpation noted decreased mobility of patient's thoracic spine as well as increased muscular restrictions in thoracic and cervical paraspinals. Patient demonstrated  decreased cervical endurance during the deep flexor endurance test. In addition, noted a right sided rib hump with forward trunk flexion. 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 Patient would benefit from skilled physical therapy in order to address the abovementioned deficits and help her return to her prior level of function and improve her overall quality of life. Plan to begin treatment sessions in session and monitor progress, with possible plan to transition to telehealth depending on progress.     Personal Factors and Comorbidities  Profession    Examination-Activity Limitations  Reach Overhead;Carry;Sit;Lift    Examination-Participation Restrictions  Community Activity;Other   Work   [employer] officer  Evolving/Moderate complexity    Clinical Decision Making  Moderate    Rehab Potential  Good    PT Frequency  2x / week    PT Duration  6 weeks    PT Treatment/Interventions  ADLs/Self Care Home Management;Electrical Stimulation;Traction;Functional mobility training;Therapeutic activities;Therapeutic exercise;Neuromuscular re-education;Patient/family education;Manual techniques;Passive range of motion;Dry needling;Taping    PT Next Visit Plan  Consider possible dry-needling. Review evaluation and goals. Review HEP. Focus on a combination of thoracic mobility exercises, cervical strengthening, scapular strengthening, stretching/mobilization of cervical paraspinals and thoracic region.     PT Home Exercise Plan  05/22/18: Trapezius stretch 3x30 seconds, Chin Tucks with axial extension x10 1x/day    Consulted and Agree with Plan of Care  Patient       Patient will benefit from skilled therapeutic intervention in order to improve the following deficits and impairments:  Decreased endurance, Hypomobility, Decreased activity  tolerance, Decreased strength, Impaired UE functional use, Pain, Decreased mobility, Decreased range of motion, Improper body mechanics, Postural dysfunction  Visit Diagnosis: Pain in thoracic spine  Other symptoms and signs involving the musculoskeletal system     Problem List Patient Active Problem List   Diagnosis Date Noted  . Neck  pain 05/17/2018  . Mid back pain 05/17/2018  . Other idiopathic scoliosis, thoracolumbar region 05/17/2018  . Right ovarian cyst 03/01/2018  . Pain, abdominal, RLQ 03/01/2018  . Vaginal discharge 03/01/2018  . Vaginal odor 03/01/2018  . Right inguinal hernia 12/14/2015  . BV (bacterial vaginosis) 04/02/2015  . Sinus infection 08/27/2013  . Irregular bleeding 06/03/2013  . Seasonal allergies 07/03/2012  . Defecation urgency    Clarene Critchley PT, DPT 6:04 PM, 05/22/18 [place], [place], [address] Phone: [phone]   Fax:  [phone]  Name: Deanna Hughes MRN: [id number] Date of Birth: [birth year]

## 2018-05-22 NOTE — Patient Instructions (Signed)
Axial Extension    Gently pull chin in while lengthening back of neck. Hold _2-3___ seconds. Repeat _10___ times. Do __1__ sessions per day.  http://gt2.exer.us/3   Copyright  VHI. All rights reserved.  NECK TENSION: Assisted Stretch    Reach right arm around head and hold slightly above ear. Gently bring right ear toward right shoulder. Hold position for 30 seconds. Repeat with other arm. Repeat _3__ times, alternating arms. Do _1-2__ times per day.  Copyright  VHI. All rights reserved.

## 2018-05-25 ENCOUNTER — Telehealth (HOSPITAL_COMMUNITY): Payer: Self-pay | Admitting: Physical Therapy

## 2018-05-25 ENCOUNTER — Ambulatory Visit (HOSPITAL_COMMUNITY): Payer: 59 | Admitting: Physical Therapy

## 2018-05-25 NOTE — Telephone Encounter (Signed)
Therapist called regarding patient not showing for 8:30 appointment this morning. Left a message stating hoped she was doing alright and reminded her of next appointment time as well as to call if she was unable to attend the next appointment and provided clinic phone number.  Clarene Critchley PT, DPT 12:45 PM, 05/25/18 [phone]

## 2018-05-28 ENCOUNTER — Encounter: Payer: Self-pay | Admitting: Family Medicine

## 2018-05-28 ENCOUNTER — Telehealth (HOSPITAL_COMMUNITY): Payer: Self-pay | Admitting: Physical Therapy

## 2018-05-28 NOTE — Telephone Encounter (Signed)
She called to cx due to her daughter having a ENT MD tomorrow moring

## 2018-05-29 ENCOUNTER — Ambulatory Visit (HOSPITAL_COMMUNITY): Payer: 59 | Admitting: Physical Therapy

## 2018-05-31 ENCOUNTER — Telehealth (HOSPITAL_COMMUNITY): Payer: Self-pay

## 2018-05-31 ENCOUNTER — Ambulatory Visit: Payer: 59 | Admitting: Family Medicine

## 2018-05-31 ENCOUNTER — Ambulatory Visit (HOSPITAL_COMMUNITY): Payer: 59

## 2018-05-31 NOTE — Telephone Encounter (Signed)
No show #2; called and LVM regarding missed appointment. Reminded pt of next appointment and left front office # for pt to call if she needed to change or cancel it.  [name] PT, DPT

## 2018-06-01 ENCOUNTER — Other Ambulatory Visit: Payer: Self-pay

## 2018-06-01 ENCOUNTER — Ambulatory Visit: Payer: 59 | Admitting: Family Medicine

## 2018-06-01 ENCOUNTER — Other Ambulatory Visit (HOSPITAL_COMMUNITY)
Admission: RE | Admit: 2018-06-01 | Discharge: 2018-06-01 | Disposition: A | Discharge status: Home / Self Care | Payer: 59 | Source: Ambulatory Visit | Attending: Family Medicine | Admitting: Family Medicine

## 2018-06-01 ENCOUNTER — Encounter: Payer: Self-pay | Admitting: Family Medicine

## 2018-06-01 ENCOUNTER — Ambulatory Visit (INDEPENDENT_AMBULATORY_CARE_PROVIDER_SITE_OTHER): Payer: 59 | Admitting: Family Medicine

## 2018-06-01 VITALS — BP 107/74 | HR 84 | Temp 98.7°F | Resp 14 | Ht 62.0 in | Wt 178.1 lb

## 2018-06-01 DIAGNOSIS — N898 Other specified noninflammatory disorders of vagina: Secondary | ICD-10-CM | POA: Insufficient documentation

## 2018-06-01 DIAGNOSIS — N760 Acute vaginitis: Secondary | ICD-10-CM

## 2018-06-01 DIAGNOSIS — B9689 Other specified bacterial agents as the cause of diseases classified elsewhere: Secondary | ICD-10-CM

## 2018-06-01 MED ORDER — FLUCONAZOLE 150 MG PO TABS: 150.0000 mg | ORAL_TABLET | Freq: Every day | ORAL | 0 refills | Status: DC

## 2018-06-01 MED ORDER — METRONIDAZOLE 500 MG PO TABS: 500.0000 mg | ORAL_TABLET | Freq: Two times a day (BID) | ORAL | 0 refills | Status: AC

## 2018-06-01 NOTE — Progress Notes (Signed)
Subjective:     Patient ID: Deanna Hughes, female   DOB: [birth year], 28 y.o.   MRN: [id number]  Deanna Hughes presents for Vaginal Discharge (+ odor x 1 week. was recently treated for BV recently. Symptoms went away with treatment but came back. )  Deanna Hughes is a 28 year old female patient of mine.  Who is now well-known to the clinic.  She presents today for the following concern recurrence of previous bacterial vaginosis symptoms.  Was treated back in March for the symptoms.  Symptoms went away post treatment.  The symptoms presented back 1 week ago from today.  Around March 7.  Symptoms include white-grayish discharge with foul odor.  Reports of increased itching.  Denies having any UTI symptoms such as increased urination or frequency.  Denies having any burning, fevers or chills.  Denies having any flank, pelvic or abdominal pain or vaginal pain or irregular bleeding.  Nothing has aggravated it nothing has relieved it.  She reports that it is constant and increasing.  Endorses that she did have intercourse 2 to 3 weeks prior to this.  Reports that she thinks that her pH level of her vagina is off and would like to try to get treatment for that after she is treated for the vaginosis.   Overall has no other concerns to discuss today in the office.  Reports taking all other previously prescribed or prescribed medications as directed.  And without issue.   Past Medical, Surgical, Social History, Allergies, and Medications have been Reviewed.  Past Medical History:  Diagnosis Date  . BV (bacterial vaginosis) 04/02/2015  . Chlamydia    treated 6/6  . Chronic back pain   . Chronic headaches    MIGRAINES  . Contraceptive management 01/25/2013  . Depression   . Encounter for Nexplanon removal 02/27/2015  . Irregular bleeding 06/03/2013  . Miscarriage 03/27/2013  . Nexplanon insertion 04/11/2013   nexplanon inserted 04/11/13 remove 3/32/18  . Right sided abdominal pain   . Scoliosis   .  Seasonal allergies 07/03/2012  . Sinus infection 08/27/2013  . Supervision of normal pregnancy 08/27/2015    [facility]inic [facility] Initiated Care at   7+5 weeks  FOB  Dating By  LMP  Pap  GC/CT Initial:                36+wks: Genetic Screen NT/IT:  CF screen  Anatomic [place]  Flu vaccine  Tdap Recommended ~ 28wks Glucose Screen  2 hr GBS  Feed Preference  Contraception  Circumcision  Childbirth Classes  Pediatrician    . Threatened abortion in early pregnancy 03/20/2013  . Vaginal discharge 10/03/2012  . Yeast infection 07/03/2012   Past Surgical History:  Procedure Laterality Date  . INGUINAL HERNIA REPAIR Right 04/19/2017   Procedure: HERNIA REPAIR INGUINAL ADULT;  Surgeon: Robert Bellow, MD;  Location: [facility] ORS;  Service: General;  Laterality: Right;  . WISDOM TOOTH EXTRACTION     Social History   Socioeconomic History  . Marital status: Single    Spouse name: Not on file  . Number of children: Not on file  . Years of education: Not on file  . Highest education level: Not on file  Occupational History  . Not on file  Social Needs  . Financial resource strain: Not hard at all  . Food insecurity:    Worry: Never true    Inability: Never true  . Transportation needs:    Medical: No  Non-medical: No  Tobacco Use  . Smoking status: Never Smoker  . Smokeless tobacco: Never Used  Substance and Sexual Activity  . Alcohol use: No  . Drug use: No  . Sexual activity: Yes    Birth control/protection: Implant  Lifestyle  . Physical activity:    Days per week: 0 days    Minutes per session: 0 min  . Stress: To some extent  Relationships  . Social connections:    Talks on phone: More than three times a week    Gets together: More than three times a week    Attends religious service: Never    Active member of club or organization: No    Attends meetings of clubs or organizations: Never    Relationship status: Never married  . Intimate partner violence:    Fear of current or ex  partner: No    Emotionally abused: No    Physically abused: No    Forced sexual activity: No  Other Topics Concern  . Not on file  Social History Narrative   Lives with her 3 children      Zimiah (8)    Zatavious (6)      Zayla (1)- Daddy is present in life.        Outpatient Encounter Medications as of 06/01/2018  Medication Sig  . acetaminophen (TYLENOL) 500 MG tablet Take 1,000 mg by mouth 3 (three) times daily as needed for moderate pain or headache.   Marland Kitchen amitriptyline (ELAVIL) 10 MG tablet Take 1 tablet (10 mg total) by mouth at bedtime.  . cyclobenzaprine (FLEXERIL) 5 MG tablet Take 1 tablet (5 mg total) by mouth 3 (three) times daily as needed for muscle spasms.  Marland Kitchen dronabinol (MARINOL) 2.5 MG capsule Take 1 capsule (2.5 mg total) by mouth 2 (two) times daily before lunch and supper.  . etonogestrel (NEXPLANON) 68 MG IMPL implant 1 each once by Subdermal route.  . fluticasone (FLONASE) 50 MCG/ACT nasal spray Place 2 sprays into both nostrils daily as needed for allergies.   . megestrol (MEGACE) 40 MG tablet 3 tablets a day for 5 days, 2 tablets a day for 5 days then 1 tablet daily  . Multiple Vitamin (MULTIVITAMIN) tablet Take 1 tablet by mouth daily.  . promethazine-dextromethorphan (PROMETHAZINE-DM) 6.25-15 MG/5ML syrup Take 5 mLs by mouth 4 (four) times daily as needed. May cause drowsiness  . fluconazole (DIFLUCAN) 150 MG tablet Take 1 tablet (150 mg total) by mouth daily. Take 1 pill now, maybe repeat if needed or after completed metronidazole.  . metroNIDAZOLE (FLAGYL) 500 MG tablet Take 1 tablet (500 mg total) by mouth 2 (two) times daily for 7 days.   No facility-administered encounter medications on file as of 06/01/2018.    Allergies  Allergen Reactions  . Aspirin Shortness Of Breath and Other (See Comments)    Heart beats fast  . Advil [Ibuprofen] Other (See Comments)    Breakout, heart palpitations and trouble breathing. Cant take Advil but can take Ibuprofen     Review of Systems  Constitutional: Negative for activity change, appetite change, chills and fever.  HENT: Negative.   Eyes: Negative.   Respiratory: Negative.   Cardiovascular: Negative.   Gastrointestinal: Negative.   Endocrine: Negative.   Genitourinary: Positive for vaginal discharge. Negative for decreased urine volume, difficulty urinating, dyspareunia, flank pain, frequency, hematuria, menstrual problem, pelvic pain, urgency, vaginal bleeding and vaginal pain.       Itching and foul d/c   Musculoskeletal: Negative.  Allergic/Immunologic: Negative.   Neurological: Negative.   Hematological: Negative.   Psychiatric/Behavioral: Negative.   All other systems reviewed and are negative.     Objective:     BP 107/74   Pulse 84   Temp 98.7 F (37.1 C) (Oral)   Resp 14   Ht 5\' 2"  (1.575 m)   Wt 178 lb 1.9 oz (80.8 kg)   SpO2 99%   BMI 32.58 kg/m   Physical Exam Vitals signs and nursing note reviewed.  Constitutional:      Appearance: Normal appearance. She is obese.  HENT:     Head: Normocephalic and atraumatic.     Right Ear: External ear normal.     Left Ear: External ear normal.     Nose: Nose normal.  Eyes:     General:        Right eye: No discharge.        Left eye: No discharge.     Conjunctiva/sclera: Conjunctivae normal.  Neck:     Musculoskeletal: Normal range of motion and neck supple.  Cardiovascular:     Rate and Rhythm: Normal rate and regular rhythm.     Pulses: Normal pulses.     Heart sounds: Normal heart sounds.  Pulmonary:     Effort: Pulmonary effort is normal.     Breath sounds: Normal breath sounds.  Abdominal:     General: Bowel sounds are normal.     Palpations: Abdomen is soft.  Musculoskeletal: Normal range of motion.  Skin:    General: Skin is warm and dry.     Capillary Refill: Capillary refill takes less than 2 seconds.  Neurological:     Mental Status: She is alert and oriented to person, place, and time.  Psychiatric:         Mood and Affect: Mood normal.        Behavior: Behavior normal.        Thought Content: Thought content normal.        Judgment: Judgment normal.       Assessment and Plan        1. Vaginal discharge Signs and symptoms today in the office present for vaginal infection.  Will provide her with medication for this.  Advised her to follow-up should symptoms not be resolved post completion of medication.  Educated about proper vaginal hygiene.    - fluconazole (DIFLUCAN) 150 MG tablet; Take 1 tablet (150 mg total) by mouth daily.  Dispense: 1 tablet; Refill: 2  2. BV (bacterial vaginosis) Recent history of having bacterial vaginosis.  Reported that she did take all of her Flagyl.  Symptoms today in the office are consistent with this.  Will send out swab for culture.  Provided with Flagyl today in the office.  Advised to continue the full course of medication.  Encouraged her to get probiotics, as she reports that she gets these infections often. Instructed that her partner might need testing if she continues to get these infections. Also can look at boric acid suppositories for pH balance.   - Cervicovaginal ancillary only - metroNIDAZOLE (FLAGYL) 500 MG tablet; Take 1 tablet (500 mg total) by mouth 2 (two) times daily for 7 days.  Dispense: 14 tablet; Refill: 0 - fluconazole (DIFLUCAN) 150 MG tablet; Take 1 tablet (150 mg total) by mouth daily. Take 1 pill now, maybe repeat if needed or after completed metronidazole.  Dispense: 2 tablet; Refill: 0   Return if symptoms worsen or fail to improve.  Perlie Mayo, DNP, AGNP-BC [place], [place], [place] [address] Office Hours: Mon-Thurs 8 am-5 pm; Fri 8 am-12 pm Office Phone:  [phone]  Office Fax: [phone]

## 2018-06-01 NOTE — Patient Instructions (Addendum)
    Thank you for coming into the office today. I appreciate the opportunity to provide you with the care for your health and wellness. Today we discussed: vagina health  I have sent in the medications for coverage of possible vaginosis and yeast.  Please take these as directed.  Please complete the full course of metronidazole.  Unless otherwise directed by [place].  We will let you know what the results of your testing today was and adjust medications as needed.  If you get better and still continue to have issues with this we can consider boric acid suppositories.  Please contact me once you have completed your medication and know that you are doing better and clear of discharge.  Please continue to practice social distancing at this time to keep you and your family and our community safe. We are right here with you in for you.  Festus YOUR HANDS WELL AND FREQUENTLY. AVOID TOUCHING YOUR FACE, UNLESS YOUR HANDS ARE FRESHLY WASHED.   GET FRESH AIR DAILY. STAY HYDRATED WITH WATER.   It was a pleasure to see you and I look forward to continuing to work together on your health and well-being. Please do not hesitate to call the office if you need care or have questions about your care.  Have a wonderful day and week. With Gratitude, Cherly Beach, DNP, AGNP-BC     '

## 2018-06-04 ENCOUNTER — Telehealth (HOSPITAL_COMMUNITY): Payer: Self-pay

## 2018-06-04 ENCOUNTER — Ambulatory Visit (HOSPITAL_COMMUNITY): Payer: 59

## 2018-06-04 ENCOUNTER — Encounter: Payer: Self-pay | Admitting: Family Medicine

## 2018-06-04 NOTE — Telephone Encounter (Signed)
No show #3; called and left voicemail for pt regarding no show for appt today. Informed pt that per attendance policy, all remaining appointments except for her next one will be cancelled and if she doesn't call to cancel/reschedule or show to her next appointment, she will be discharged at that time. Left front office # for pt to contact office regarding her Friday appt.   [name] PT, DPT

## 2018-06-05 LAB — CERVICOVAGINAL ANCILLARY ONLY
Bacterial vaginitis: POSITIVE — AB
Candida vaginitis: NEGATIVE
Chlamydia: NEGATIVE
Neisseria Gonorrhea: NEGATIVE
Trichomonas: NEGATIVE

## 2018-06-08 ENCOUNTER — Telehealth (HOSPITAL_COMMUNITY): Payer: Self-pay | Admitting: Physical Therapy

## 2018-06-08 ENCOUNTER — Encounter (HOSPITAL_COMMUNITY): Payer: Self-pay | Admitting: Physical Therapy

## 2018-06-08 ENCOUNTER — Ambulatory Visit (HOSPITAL_COMMUNITY): Payer: 59 | Admitting: Physical Therapy

## 2018-06-08 NOTE — Therapy (Signed)
[place], [place], [address] Phone: [phone]   Fax:  [phone]  Patient Details  Name: Deanna Hughes MRN: [id number] Date of Birth: 07-11-1990 Referring Provider:  No ref. provider found  Encounter Date: 06/08/2018   PHYSICAL THERAPY DISCHARGE SUMMARY  Visits from Start of Care: 1  Current functional level related to goals / functional outcomes: Unknown due to not returning following evaluation   Remaining deficits: Unknown due to not returning following evaluation   Education / Equipment: Initial HEP and benefits of HEP. Called and explained attendance policy and reason for discharge due to No-shows.  Plan: Patient agrees to discharge.  Patient goals were not met. Patient is being discharged due to not returning since the last visit.  ?????         Clarene Critchley PT, DPT 11:10 AM, 06/08/18 [place], [place], [address] Phone: [phone]   Fax:  [phone]

## 2018-06-08 NOTE — Telephone Encounter (Signed)
Therapist called regarding patient not showing for appointment this date. As this was her 3rd of 4th no-show explained that per attendance policy patient would be discharged from further physical therapy services. Explained that patient would need to get another referral from her MD if she wished to return to therapy. Provided clinic phone number if she had any questions or concerns.  Clarene Critchley PT, DPT 11:02 AM, 06/08/18 [phone]

## 2018-06-12 ENCOUNTER — Encounter: Payer: 59 | Admitting: Family Medicine

## 2018-06-12 ENCOUNTER — Ambulatory Visit (HOSPITAL_COMMUNITY): Payer: 59 | Admitting: Physical Therapy

## 2018-06-14 ENCOUNTER — Ambulatory Visit (HOSPITAL_COMMUNITY): Payer: 59

## 2018-06-14 ENCOUNTER — Ambulatory Visit: Payer: 59 | Admitting: Family Medicine

## 2018-06-18 ENCOUNTER — Ambulatory Visit (HOSPITAL_COMMUNITY): Payer: 59

## 2018-06-19 ENCOUNTER — Ambulatory Visit (INDEPENDENT_AMBULATORY_CARE_PROVIDER_SITE_OTHER): Payer: 59 | Admitting: Family Medicine

## 2018-06-19 ENCOUNTER — Encounter: Payer: Self-pay | Admitting: Family Medicine

## 2018-06-19 ENCOUNTER — Other Ambulatory Visit: Payer: Self-pay

## 2018-06-19 VITALS — BP 102/60 | HR 81 | Temp 99.2°F | Resp 12 | Ht 62.0 in | Wt 177.0 lb

## 2018-06-19 DIAGNOSIS — N898 Other specified noninflammatory disorders of vagina: Secondary | ICD-10-CM | POA: Diagnosis not present

## 2018-06-19 DIAGNOSIS — J302 Other seasonal allergic rhinitis: Secondary | ICD-10-CM | POA: Diagnosis not present

## 2018-06-19 DIAGNOSIS — F419 Anxiety disorder, unspecified: Secondary | ICD-10-CM | POA: Diagnosis not present

## 2018-06-19 MED ORDER — FLUTICASONE PROPIONATE 50 MCG/ACT NA SUSP: 2.0000 | Freq: Every day | NASAL | 2 refills | Status: DC | PRN

## 2018-06-19 MED ORDER — BUSPIRONE HCL 5 MG PO TABS: 5.0000 mg | ORAL_TABLET | Freq: Two times a day (BID) | ORAL | 1 refills | Status: DC

## 2018-06-19 MED ORDER — UNABLE TO FIND: 1.0000 | 3 refills | Status: DC

## 2018-06-19 NOTE — Progress Notes (Signed)
Health Maintenance reviewed -  Immunization History  Administered Date(s) Administered   Influenza,inj,Quad PF,6+ Mos 10/26/2015   Influenza-Unspecified 09/22/2016, 10/05/2017   Last Pap smear:  [facility] 2019 Last mammogram: n/a Last colonoscopy: n/a Last DEXA: n/a Dentist: 2 times year-June 2020 Ophtho: n/a Exercise: walk daily 3 miles daily   Other doctors caring for patient include:  Patient Care Team: Perlie Mayo, NP as PCP - General (Family Medicine) Bary Castilla Forest Gleason, MD as Consulting Physician (General Surgery) Christin Fudge, CNM as Referring Physician (Certified Nurse Midwife)   Subjective:   HPI  Deanna Hughes is a 28 y.o. female who presents for follow-up on chronic medical conditions.  She has the following concerns: She has the following concerns ongoing vaginal discharge and discomfort.  Thinks that her pH is off.  Is wondering about treatment for this.  She has been treated a couple times for bacterial vaginosis.  She reports that she did take all of her medications as directed.  Additionally she still has ongoing back and shoulder pain.  But reports that she is doing good with physical therapy.    She also is having some allergies and would like to have some Flonase to help with this.  Furthermore she reports that she has had an increase in her anxiety secondary to having to constantly wear a mask at work.  She is slightly claustrophobic in wearing the mask makes her have panic attacks.  Would like to know about getting something for this intermittently.  Has follow-up this afternoon with GI related to hemorrhoids.  Overall she is doing well today in office aside from what is listed above.  Reports that she has been walking 3 miles a day.  And has been looking at what she is eating better.  Is drinking 3 bottles of water comparatively because she does not like water this is much better improvement for her.  Additionally she has cut  back on her sodas and is only drinking 2 sodas a day.  Has been trying to make sure she gets vegetables 3-4 times a day.  In addition to having meat chicken and pork and fish.  And reduction in red meat.   Denies having any signs and symptoms of infection including cough, shortness of breath, fever, chills or any other signs or symptoms of COVID.  Past Medical, Surgical, Social History, Allergies, and Medications have been Reviewed.    Review Of Systems  Review of Systems  Constitutional: Negative for activity change, appetite change, chills and fever.  HENT: Negative.   Eyes: Negative for visual disturbance.  Respiratory: Negative for cough and shortness of breath.   Cardiovascular: Negative for chest pain.  Gastrointestinal: Negative.   Endocrine: Negative for polydipsia, polyphagia and polyuria.  Genitourinary: Negative.   Musculoskeletal: Positive for back pain.  Skin: Negative.   Allergic/Immunologic: Positive for environmental allergies.  Neurological: Positive for dizziness and headaches.  Hematological: Negative.   Psychiatric/Behavioral: Negative for sleep disturbance.  All other systems reviewed and are negative.   Objective:   PHYSICAL EXAM:  BP 102/60    Pulse 81    Temp 99.2 F (37.3 C) (Oral)    Resp 12    Ht 5\' 2"  (1.575 m)    Wt 177 lb (80.3 kg)    SpO2 99%    BMI 32.37 kg/m     Physical Exam Vitals signs and nursing note reviewed.  Constitutional:      Appearance: Normal appearance. She is  obese.  HENT:     Head: Normocephalic and atraumatic.     Right Ear: Tympanic membrane, ear canal and external ear normal.     Left Ear: Tympanic membrane, ear canal and external ear normal.     Nose: Nose normal.     Mouth/Throat:     Mouth: Mucous membranes are moist.     Pharynx: Oropharynx is clear.  Eyes:     General: No scleral icterus.       Right eye: No discharge.        Left eye: No discharge.     Extraocular Movements: Extraocular movements intact.       Conjunctiva/sclera: Conjunctivae normal.     Pupils: Pupils are equal, round, and reactive to light.  Neck:     Musculoskeletal: Normal range of motion and neck supple.  Cardiovascular:     Rate and Rhythm: Normal rate and regular rhythm.     Pulses: Normal pulses.          Radial pulses are 2+ on the right side and 2+ on the left side.       Dorsalis pedis pulses are 2+ on the right side and 2+ on the left side.     Heart sounds: Normal heart sounds.  Pulmonary:     Effort: Pulmonary effort is normal.     Breath sounds: Normal breath sounds.  Abdominal:     General: Abdomen is flat. Bowel sounds are normal.     Palpations: Abdomen is soft.  Musculoskeletal: Normal range of motion.     Right lower leg: No edema.     Left lower leg: No edema.  Skin:    General: Skin is warm and dry.     Capillary Refill: Capillary refill takes less than 2 seconds.  Neurological:     General: No focal deficit present.     Mental Status: She is alert and oriented to person, place, and time. Mental status is at baseline.     Cranial Nerves: Cranial nerves are intact.     Sensory: Sensation is intact.     Motor: Motor function is intact.     Gait: Gait is intact.     Deep Tendon Reflexes: Reflexes are normal and symmetric.  Psychiatric:        Attention and Perception: Attention normal.        Mood and Affect: Mood normal.        Behavior: Behavior normal.        Thought Content: Thought content normal.        Judgment: Judgment normal.     Depression Screening  Depression screen [facility] 2/9 06/19/2018 06/01/2018 05/15/2018 04/04/2018 03/13/2018  Decreased Interest 0 0 0 0 0  Down, Depressed, Hopeless 0 0 0 0 0  PHQ - 2 Score 0 0 0 0 0  Altered sleeping 0 0 0 0 -  Tired, decreased energy 0 1 0 1 -  Change in appetite 0 0 0 1 -  Feeling bad or failure about yourself  0 0 0 0 -  Trouble concentrating 0 0 0 0 -  Moving slowly or fidgety/restless 0 0 0 0 -  Suicidal thoughts 0 0 0 0 -  PHQ-9  Score 0 1 0 2 -      Assessment & Plan:   1. Anxiety Uncontrolled, will try BuSpar for intermittent use secondary to wearing mask at work.  If BuSpar does not work we will look at starting medication for  her to be on on a regular basis.  As do not want to prescribe any Xanax or any other benzo family for intermittent anxiety at this time.  Educated her on making sure she takes breaks and relaxes without the mask on if possible in a safe environment.  Reviewed side effects, risks and benefits of medication.   Patient acknowledged agreement and understanding of the plan.   - busPIRone (BUSPAR) 5 MG tablet; Take 1 tablet (5 mg total) by mouth 2 (two) times daily.  Dispense: 60 tablet; Refill: 1  2. Vaginal discharge Reports having ongoing vaginal issues.  Questions whether or not pH is off.  Has been treated in the past with bacterial vaginosis reports taking her medication as directed.  Intermittently does not have issues.  Will try boric acid to help with vaginal pH and overall health.  Additionally reviewed vaginal health with her as previously provided information at office visit in the spring.  Reviewed side effects, risks and benefits of medication.   Patient acknowledged agreement and understanding of the plan.    - UNABLE TO FIND; Place 1 suppository vaginally 3 (three) times a week. Med Name: Boric Acid Vaginal Suppository  Dispense: 30 Dose; Refill: 3  3. Seasonal allergies Uncontrolled, will provide her with Flonase as this is what is most helpful for her in the past.  Reviewed side effects, risks and benefits of medication.   Patient acknowledged agreement and understanding of the plan.   - fluticasone (FLONASE) 50 MCG/ACT nasal spray; Place 2 sprays into both nostrils daily as needed for allergies.  Dispense: 16 g; Refill: 2  Follow-up as needed   Perlie Mayo, NP   06/19/2018

## 2018-06-20 ENCOUNTER — Encounter: Payer: Self-pay | Admitting: Gastroenterology

## 2018-06-20 ENCOUNTER — Ambulatory Visit (INDEPENDENT_AMBULATORY_CARE_PROVIDER_SITE_OTHER): Payer: 59 | Admitting: Gastroenterology

## 2018-06-20 ENCOUNTER — Other Ambulatory Visit: Payer: Self-pay | Admitting: *Deleted

## 2018-06-20 ENCOUNTER — Telehealth: Payer: Self-pay | Admitting: *Deleted

## 2018-06-20 ENCOUNTER — Encounter: Payer: Self-pay | Admitting: Family Medicine

## 2018-06-20 ENCOUNTER — Ambulatory Visit (HOSPITAL_COMMUNITY): Payer: 59

## 2018-06-20 DIAGNOSIS — K648 Other hemorrhoids: Secondary | ICD-10-CM

## 2018-06-20 DIAGNOSIS — K625 Hemorrhage of anus and rectum: Secondary | ICD-10-CM | POA: Diagnosis not present

## 2018-06-20 DIAGNOSIS — K6289 Other specified diseases of anus and rectum: Secondary | ICD-10-CM

## 2018-06-20 MED ORDER — PEG 3350-KCL-NA BICARB-NACL 420 G PO SOLR: 4000.0000 mL | Freq: Once | ORAL | 0 refills | Status: AC

## 2018-06-20 MED ORDER — HYDROCORTISONE ACETATE 25 MG RE SUPP: 25.0000 mg | Freq: Two times a day (BID) | RECTAL | 1 refills | Status: DC

## 2018-06-20 NOTE — Telephone Encounter (Signed)
Called patient. She is scheduled for TCS/hem banding w/ prop with [facility] on 09/18/2018 at 8:30am. Patient needs Rx for prep to be sent to [facility] (RX sent). I advised pt she will need to go for pre-op appt and I will mail this appt with her instructions (confirmed mailing address). Orders entered   PA submitted via [facility] website. Case ID# [id number]

## 2018-06-20 NOTE — Progress Notes (Signed)
Primary Care Physician:  Perlie Mayo, NP Primary Gastroenterologist:  Dr. Oneida Alar   Chief Complaint  Patient presents with  . Hemorrhoids    Burning,bleeding,itching,hard to sit or walk when they flare up    HPI:   Deanna Hughes is a 28 y.o. female presenting today at the request of Cherly Beach, NP due to chronic symptomatic hemorrhoids. She is an employee of [employer] and works in [employer]. Requesting Dr. Oneida Alar for gastroenterologist.   She first experienced flare of hemorrhoids in 2013 after the birth of her first child, who was 10 lbs. Hemorrhoids worsened in 2018 after the birth of her last child.   Intermittent flares out of nowhere. Rectal bleeding, itching, burning, painful when wiping. Hemorrhoid cream (prep H), Tucks pads. Calms mildly. No prolapse symptoms. No rectal pain with BM. +burning. Paper hematochezia and blood in toilet as well. +constipation. BM once to three times a week. Will eat applesauce and then can go. When constipated, +straining. Toilet time 3-4 minutes. No abdominal pain.   No family history of colon cancer or colon polyps.   Past Medical History:  Diagnosis Date  . BV (bacterial vaginosis) 04/02/2015  . Chlamydia    treated 6/6  . Chronic back pain   . Chronic headaches    MIGRAINES  . Contraceptive management 01/25/2013  . Depression   . Encounter for Nexplanon removal 02/27/2015  . Irregular bleeding 06/03/2013  . Miscarriage 03/27/2013  . Nexplanon insertion 04/11/2013   nexplanon inserted 04/11/13 remove 3/32/18  . Right sided abdominal pain   . Scoliosis   . Seasonal allergies 07/03/2012  . Sinus infection 08/27/2013  . Supervision of normal pregnancy 08/27/2015    Clinic [facility] Initiated Care at   7+5 weeks  FOB  Dating By  LMP  Pap  GC/CT Initial:                36+wks: Genetic Screen NT/IT:  CF screen  Anatomic [place]  Flu vaccine  Tdap Recommended ~ 28wks Glucose Screen  2 hr GBS  Feed Preference  Contraception   Circumcision  Childbirth Classes  Pediatrician    . Threatened abortion in early pregnancy 03/20/2013  . Vaginal discharge 10/03/2012  . Yeast infection 07/03/2012    Past Surgical History:  Procedure Laterality Date  . INGUINAL HERNIA REPAIR Right 04/19/2017   Procedure: HERNIA REPAIR INGUINAL ADULT;  Surgeon: Robert Bellow, MD;  Location: [facility] ORS;  Service: General;  Laterality: Right;  . WISDOM TOOTH EXTRACTION      Current Outpatient Medications  Medication Sig Dispense Refill  . acetaminophen (TYLENOL) 500 MG tablet Take 1,000 mg by mouth 3 (three) times daily as needed for moderate pain or headache.     Marland Kitchen amitriptyline (ELAVIL) 10 MG tablet Take 1 tablet (10 mg total) by mouth at bedtime. 30 tablet 0  . busPIRone (BUSPAR) 5 MG tablet Take 1 tablet (5 mg total) by mouth 2 (two) times daily. 60 tablet 1  . cyclobenzaprine (FLEXERIL) 5 MG tablet Take 1 tablet (5 mg total) by mouth 3 (three) times daily as needed for muscle spasms. 30 tablet 0  . etonogestrel (NEXPLANON) 68 MG IMPL implant 1 each once by Subdermal route.    . fluticasone (FLONASE) 50 MCG/ACT nasal spray Place 2 sprays into both nostrils daily as needed for allergies. 16 g 2  . megestrol (MEGACE) 40 MG tablet 3 tablets a day for 5 days, 2 tablets a day for 5  days then 1 tablet daily 45 tablet 3  . Multiple Vitamin (MULTIVITAMIN) tablet Take 1 tablet by mouth daily.    Marland Kitchen UNABLE TO FIND Place 1 suppository vaginally 3 (three) times a week. Med Name: Boric Acid Vaginal Suppository 30 Dose 3   No current facility-administered medications for this visit.     Allergies as of 06/20/2018 - Review Complete 06/20/2018  Allergen Reaction Noted  . Aspirin Shortness Of Breath and Other (See Comments) 04/17/2017  . Advil [ibuprofen] Other (See Comments) 12/05/2016  . Latex Rash 06/20/2018    Family History  Problem Relation Age of Onset  . Asthma Mother   . Asthma Sister   . Asthma Brother   . Asthma Daughter   . Diabetes  Maternal Grandmother   . Hypertension Maternal Grandmother   . Asthma Maternal Grandmother   . Heart disease Maternal Grandmother   . Stroke Maternal Grandmother   . Hypertension Maternal Grandfather   . Asthma Maternal Grandfather   . Heart disease Maternal Grandfather   . Asthma Son   . Colon cancer Neg Hx   . Colon polyps Neg Hx     Social History   Socioeconomic History  . Marital status: Single    Spouse name: Not on file  . Number of children: Not on file  . Years of education: Not on file  . Highest education level: Not on file  Occupational History    Comment: [employer], cafeteria  Social Needs  . Financial resource strain: Not hard at all  . Food insecurity:    Worry: Never true    Inability: Never true  . Transportation needs:    Medical: No    Non-medical: No  Tobacco Use  . Smoking status: Never Smoker  . Smokeless tobacco: Never Used  Substance and Sexual Activity  . Alcohol use: No  . Drug use: No  . Sexual activity: Yes    Birth control/protection: Implant  Lifestyle  . Physical activity:    Days per week: 0 days    Minutes per session: 0 min  . Stress: To some extent  Relationships  . Social connections:    Talks on phone: More than three times a week    Gets together: More than three times a week    Attends religious service: Never    Active member of club or organization: No    Attends meetings of clubs or organizations: Never    Relationship status: Never married  . Intimate partner violence:    Fear of current or ex partner: No    Emotionally abused: No    Physically abused: No    Forced sexual activity: No  Other Topics Concern  . Not on file  Social History Narrative   Lives with her 3 children      Zimiah (8)    Zatavious (6)      Zayla (1)- Daddy is present in life.        Review of Systems: Gen: Denies any fever, chills, fatigue, weight loss, lack of appetite.  CV: Denies chest pain, heart palpitations, peripheral edema,  syncope.  Resp: Denies shortness of breath at rest or with exertion. Denies wheezing or cough.  GI: see HPI GU : Denies urinary burning, urinary frequency, urinary hesitancy MS: Denies joint pain, muscle weakness, cramps, or limitation of movement.  Derm: Denies rash, itching, dry skin Psych: Denies depression, anxiety, memory loss, and confusion Heme: see HPI  Physical Exam: BP 125/74   Pulse  89   Temp 97.9 F (36.6 C)   Ht 5\' 2"  (1.575 m)   Wt 176 lb (79.8 kg)   LMP 06/06/2018 (Approximate)   BMI 32.19 kg/m  General:   Alert and oriented. Pleasant and cooperative. Well-nourished and well-developed.  Head:  Normocephalic and atraumatic. Eyes:  Without icterus, sclera clear and conjunctiva pink.  Ears:  Normal auditory acuity. Nose:  No deformity, discharge,  or lesions. Mouth:  No deformity or lesions, oral mucosa pink.  Lungs:  Clear to auscultation bilaterally. No wheezes, rales, or rhonchi. No distress.  Heart:  S1, S2 present without murmurs appreciated.  Abdomen:  +BS, soft, non-tender and non-distended. No HSM noted. No guarding or rebound. No masses appreciated.  Rectal:  No external hemorrhoids. Internal exam without mass. Slight discomfort. No gross blood appreciated. No obvious anal fissure.  Msk:  Symmetrical without gross deformities. Normal posture. Extremities:  Without  edema. Neurologic:  Alert and  oriented x4 Psych:  Alert and cooperative. Normal mood and affect.

## 2018-06-20 NOTE — Assessment & Plan Note (Signed)
28 year old female with chronic intermittent rectal bleeding likely due to symptomatic hemorrhoids. On rectal exam, no evidence for prolapse, thrombosed hemorrhoids, or anal fissure. No mass internally. Discussed that rectal bleeding likely due to benign anorectal source but would be prudent to pursue colonoscopy with hemorrhoid banding at time of procedure to exclude any occult etiology. She is agreeable to this.   In interim, need to aggressively manage constipation and provide supportive measures for symptomatic hemorrhoids. Start Amitiza 24 mcg BID, samples provided. Patient to call with progress report. Anusol suppositories sent to pharmacy Proceed with colonoscopy with Dr. Oneida Alar in the near future. Possible banding at time of procedure.The risks, benefits, and alternatives have been discussed in detail with the patient. They state understanding and desire to proceed.  Propofol due to polypharmacy Return in 4 months

## 2018-06-20 NOTE — Patient Instructions (Addendum)
    Thank you for coming into the office today. I appreciate the opportunity to provide you with the care for your health and wellness. Today we discussed: Overall health  It appears that we did a physical exam earlier in the year.  So therefore this office visit we addressed allergies, anxiety, vaginal health.  Please refer to the information I provided with the back in March for vaginal health along with taking the medication prescribed today.  I have sent your medications to the pharmacy these include your Flonase for allergies, BuSpar for anxiety, boric acid for vaginal health.  Please take these as directed and if he has any issues or concerns or questions please do not hesitate to reach out.  Please continue your walking 3 miles daily.  Making sure that you have good heart health and good weight loss.  This will help you with management of both of these.  And prevent you from possibly developing diabetes later in life  Please continue to practice social distancing at this time to keep you, your family and our community safe.  If you must go out please continue to wear a mask as you did so today and while you are at work.  Please remember to practice good hand hygiene as well.   You will need a yearly follow-up in March for your annual visit.  Otherwise you can call the office as needed for the next year for anything to be seen.  [place] YOUR HANDS WELL AND FREQUENTLY. AVOID TOUCHING YOUR FACE, UNLESS YOUR HANDS ARE FRESHLY WASHED.  GET FRESH AIR DAILY. STAY HYDRATED WITH WATER.   It was a pleasure to see you and I look forward to continuing to work together on your health and well-being. Please do not hesitate to call the office if you need care or have questions about your care.  Have a wonderful day and week. With Gratitude, Cherly Beach, DNP, AGNP-BC

## 2018-06-20 NOTE — Assessment & Plan Note (Signed)
In setting of constipation, symptomatic hemorrhoids. Clinically, she has more of a discomfort than pain and does not appear to be dealing with an anal fissure. She is to call if Anusol and other supportive measures do not help and would then send in [facility] cream compounded with nitro. Colonoscopy with +/- banding as planned.

## 2018-06-20 NOTE — Progress Notes (Signed)
cc'ed to pcp °

## 2018-06-20 NOTE — Telephone Encounter (Signed)
Pre-op appt mailed with instructions 

## 2018-06-20 NOTE — Patient Instructions (Addendum)
I sent in rectal suppositories to use twice a day for the next 7 days. Then take a break, and resume if needed for another course. Please let me know if this isn't covered well.   You may use lidocaine ointment over the counter to help with rectal discomfort as needed.  Start taking Amitiza 1 gelcap with food (To avoid nausea) each evening with dinner for the next few days. Increase to twice a day if needed, as this would be the standard dosing. Please send a message or call with how this is doing.  We have arranged a colonoscopy with Dr. Oneida Alar with banding as appropriate.   We will see you back in 4 months!  It was a pleasure to see you today. I strive to create trusting relationships with patients to provide genuine, compassionate, and quality care. I value your feedback. If you receive a survey regarding your visit,  I greatly appreciate you taking time to fill this out.   Annitta Needs, PhD, ANP-BC [facility]     Hemorrhoids Hemorrhoids are swollen veins in and around the rectum or anus. There are two types of hemorrhoids:  Internal hemorrhoids. These occur in the veins that are just inside the rectum. They may poke through to the outside and become irritated and painful.  External hemorrhoids. These occur in the veins that are outside the anus and can be felt as a painful swelling or hard lump near the anus. Most hemorrhoids do not cause serious problems, and they can be managed with home treatments such as diet and lifestyle changes. If home treatments do not help the symptoms, procedures can be done to shrink or remove the hemorrhoids. What are the causes? This condition is caused by increased pressure in the anal area. This pressure may result from various things, including:  Constipation.  Straining to have a bowel movement.  Diarrhea.  Pregnancy.  Obesity.  Sitting for long periods of time.  Heavy lifting or other activity that causes you to  strain.  Anal sex.  Riding a bike for a long period of time. What are the signs or symptoms? Symptoms of this condition include:  Pain.  Anal itching or irritation.  Rectal bleeding.  Leakage of stool (feces).  Anal swelling.  One or more lumps around the anus. How is this diagnosed? This condition can often be diagnosed through a visual exam. Other exams or tests may also be done, such as:  An exam that involves feeling the rectal area with a gloved hand (digital rectal exam).  An exam of the anal canal that is done using a small tube (anoscope).  A blood test, if you have lost a significant amount of blood.  A test to look inside the colon using a flexible tube with a camera on the end (sigmoidoscopy or colonoscopy). How is this treated? This condition can usually be treated at home. However, various procedures may be done if dietary changes, lifestyle changes, and other home treatments do not help your symptoms. These procedures can help make the hemorrhoids smaller or remove them completely. Some of these procedures involve surgery, and others do not. Common procedures include:  Rubber band ligation. Rubber bands are placed at the base of the hemorrhoids to cut off their blood supply.  Sclerotherapy. Medicine is injected into the hemorrhoids to shrink them.  Infrared coagulation. A type of light energy is used to get rid of the hemorrhoids.  Hemorrhoidectomy surgery. The hemorrhoids are surgically removed, and the  veins that supply them are tied off.  Stapled hemorrhoidopexy surgery. The surgeon staples the base of the hemorrhoid to the rectal wall. Follow these instructions at home: Eating and drinking   Eat foods that have a lot of fiber in them, such as whole grains, beans, nuts, fruits, and vegetables.  Ask your health care provider about taking products that have added fiber (fiber supplements).  Reduce the amount of fat in your diet. You can do this by  eating low-fat dairy products, eating less red meat, and avoiding processed foods.  Drink enough fluid to keep your urine pale yellow. Managing pain and swelling   Take warm sitz baths for 20 minutes, 3-4 times a day to ease pain and discomfort. You may do this in a bathtub or using a portable sitz bath that fits over the toilet.  If directed, apply ice to the affected area. Using ice packs between sitz baths may be helpful. ? Put ice in a plastic bag. ? Place a towel between your skin and the bag. ? Leave the ice on for 20 minutes, 2-3 times a day. General instructions  Take over-the-counter and prescription medicines only as told by your health care provider.  Use medicated creams or suppositories as told.  Get regular exercise. Ask your health care provider how much and what kind of exercise is best for you. In general, you should do moderate exercise for at least 30 minutes on most days of the week (150 minutes each week). This can include activities such as walking, biking, or yoga.  Go to the bathroom when you have the urge to have a bowel movement. Do not wait.  Avoid straining to have bowel movements.  Keep the anal area dry and clean. Use wet toilet paper or moist towelettes after a bowel movement.  Do not sit on the toilet for long periods of time. This increases blood pooling and pain.  Keep all follow-up visits as told by your health care provider. This is important. Contact a health care provider if you have:  Increasing pain and swelling that are not controlled by treatment or medicine.  Difficulty having a bowel movement, or you are unable to have a bowel movement.  Pain or inflammation outside the area of the hemorrhoids. Get help right away if you have:  Uncontrolled bleeding from your rectum. Summary  Hemorrhoids are swollen veins in and around the rectum or anus.  Most hemorrhoids can be managed with home treatments such as diet and lifestyle  changes.  Taking warm sitz baths can help ease pain and discomfort.  In severe cases, procedures or surgery can be done to shrink or remove the hemorrhoids. This information is not intended to replace advice given to you by your health care provider. Make sure you discuss any questions you have with your health care provider. Document Released: 01/01/2000 Document Revised: 05/25/2017 Document Reviewed: 05/25/2017 Elsevier Interactive Patient Education  2019 [employer].

## 2018-06-22 NOTE — Telephone Encounter (Signed)
Thank you for your prior authorization submitted under Case ID# [id number] for Deanna Hughes. This  is to confirm that your prior authorization request has been processed and status has been updated as Not Required. Comments/Confirmation No: [id number], [id number] do not require precert

## 2018-06-25 ENCOUNTER — Encounter (HOSPITAL_COMMUNITY): Payer: 59

## 2018-06-26 ENCOUNTER — Encounter (HOSPITAL_COMMUNITY): Payer: 59

## 2018-07-05 ENCOUNTER — Ambulatory Visit: Payer: 59 | Admitting: Orthopaedic Surgery

## 2018-07-05 ENCOUNTER — Other Ambulatory Visit: Payer: Self-pay

## 2018-07-10 NOTE — Progress Notes (Addendum)
REVIEWED. TCS FOR RECTAL BLEEDING/?HEMORRHOID BANDING.

## 2018-07-11 ENCOUNTER — Encounter: Payer: Self-pay | Admitting: Family Medicine

## 2018-07-12 ENCOUNTER — Other Ambulatory Visit: Payer: Self-pay

## 2018-07-12 ENCOUNTER — Telehealth (INDEPENDENT_AMBULATORY_CARE_PROVIDER_SITE_OTHER): Payer: 59 | Admitting: Family Medicine

## 2018-07-12 ENCOUNTER — Ambulatory Visit: Payer: 59 | Admitting: Family Medicine

## 2018-07-12 ENCOUNTER — Encounter: Payer: Self-pay | Admitting: Family Medicine

## 2018-07-12 VITALS — Ht 62.0 in | Wt 171.0 lb

## 2018-07-12 DIAGNOSIS — G43009 Migraine without aura, not intractable, without status migrainosus: Secondary | ICD-10-CM | POA: Diagnosis not present

## 2018-07-12 MED ORDER — AMITRIPTYLINE HCL 50 MG PO TABS: 50.0000 mg | ORAL_TABLET | Freq: Every day | ORAL | 1 refills | Status: DC

## 2018-07-12 MED ORDER — RIZATRIPTAN BENZOATE 5 MG PO TABS: 5.0000 mg | ORAL_TABLET | ORAL | 0 refills | Status: DC | PRN

## 2018-07-12 NOTE — Patient Instructions (Addendum)
Thank you for coming into the office today. I appreciate the opportunity to provide you with the care for your health and wellness. Today we discussed: migraines  FOLLOW UP: 4 weeks  Please start the new medications as directed. Call if you have issues or trouble.  Please use the Maxalt in an acute migraine (not a daily headache)  Please take the medication Amitriptyline at bedtime. We can increase the dose as we go if some improvement, but not enough.  Can consider Neurology for further work up as well.  Please drink 8 cups of water daily.  And try to keep a journal to identify triggers for migraines.   [place] YOUR HANDS WELL AND FREQUENTLY. AVOID TOUCHING YOUR FACE, UNLESS YOUR HANDS ARE FRESHLY WASHED.   GET FRESH AIR DAILY. STAY HYDRATED WITH WATER.   It was a pleasure to see you and I look forward to continuing to work together on your health and well-being. Please do not hesitate to call the office if you need care or have questions about your care.  Have a wonderful day and week.  With Gratitude,  Cherly Beach, DNP, AGNP-BC   Migraine Headache  A migraine headache is a very strong throbbing pain on one side or both sides of your head. Migraines can also cause other symptoms. Talk with your doctor about what things may bring on (trigger) your migraine headaches. Follow these instructions at home: Medicines  Take over-the-counter and prescription medicines only as told by your doctor.  Do not drive or use heavy machinery while taking prescription pain medicine.  To prevent or treat constipation while you are taking prescription pain medicine, your doctor may recommend that you: ? Drink enough fluid to keep your pee (urine) clear or pale yellow. ? Take over-the-counter or prescription medicines. ? Eat foods that are high in fiber. These include fresh fruits and vegetables, whole grains, and beans. ? Limit foods that are high in fat and processed sugars. These  include fried and sweet foods. Lifestyle  Avoid alcohol.  Do not use any products that contain nicotine or tobacco, such as cigarettes and e-cigarettes. If you need help quitting, ask your doctor.  Get at least 8 hours of sleep every night.  Limit your stress. General instructions   Keep a journal to find out what may bring on your migraines. For example, write down: ? What you eat and drink. ? How much sleep you get. ? Any change in what you eat or drink. ? Any change in your medicines.  If you have a migraine: ? Avoid things that make your symptoms worse, such as bright lights. ? It may help to lie down in a dark, quiet room. ? Do not drive or use heavy machinery. ? Ask your doctor what activities are safe for you.  Keep all follow-up visits as told by your doctor. This is important. Contact a doctor if:  You get a migraine that is different or worse than your usual migraines. Get help right away if:  Your migraine gets very bad.  You have a fever.  You have a stiff neck.  You have trouble seeing.  Your muscles feel weak or like you cannot control them.  You start to lose your balance a lot.  You start to have trouble walking.  You pass out (faint). This information is not intended to replace advice given to you by your health care provider. Make sure you discuss any questions you have with your health  care provider. Document Released: 10/13/2007 Document Revised: 09/27/2017 Document Reviewed: 06/22/2015 Elsevier Interactive Patient Education  2019 Elsevier Inc.    Rizatriptan tablets What is this medicine? RIZATRIPTAN (rye za TRIP tan) is used to treat migraines with or without aura. An aura is a strange feeling or visual disturbance that warns you of an attack. It is not used to prevent migraines. This medicine may be used for other purposes; ask your health care provider or pharmacist if you have questions. COMMON BRAND NAME(S): Maxalt What should I tell  my health care provider before I take this medicine? They need to know if you have any of these conditions: -cigarette smoker -circulation problems in fingers and toes -diabetes -heart disease -high blood pressure -high cholesterol -history of irregular heartbeat -history of stroke -kidney disease -liver disease -stomach or intestine problems -an unusual or allergic reaction to rizatriptan, other medicines, foods, dyes, or preservatives -pregnant or trying to get pregnant -breast-feeding How should I use this medicine? Take this medicine by mouth with a glass of water. Follow the directions on the prescription label. Do not take it more often than directed. Talk to your pediatrician regarding the use of this medicine in children. While this drug may be prescribed for children as young as 6 years for selected conditions, precautions do apply. Overdosage: If you think you have taken too much of this medicine contact a poison control center or emergency room at once. NOTE: This medicine is only for you. Do not share this medicine with others. What if I miss a dose? This does not apply. This medicine is not for regular use. What may interact with this medicine? Do not take this medicine with any of the following medicines: -certain medicines for migraine headache like almotriptan, eletriptan, frovatriptan, naratriptan, rizatriptan, sumatriptan, zolmitriptan -ergot alkaloids like dihydroergotamine, ergonovine, ergotamine, methylergonovine -MAOIs like Carbex, Eldepryl, Marplan, Nardil, and Parnate This medicine may also interact with the following medications: -certain medicines for depression, anxiety, or psychotic disorders -propranolol This list may not describe all possible interactions. Give your health care provider a list of all the medicines, herbs, non-prescription drugs, or dietary supplements you use. Also tell them if you smoke, drink alcohol, or use illegal drugs. Some items may  interact with your medicine. What should I watch for while using this medicine? Visit your healthcare professional for regular checks on your progress. Tell your healthcare professional if your symptoms do not start to get better or if they get worse. You may get drowsy or dizzy. Do not drive, use machinery, or do anything that needs mental alertness until you know how this medicine affects you. Do not stand up or sit up quickly, especially if you are an older patient. This reduces the risk of dizzy or fainting spells. Alcohol may interfere with the effect of this medicine. Your mouth may get dry. Chewing sugarless gum or sucking hard candy and drinking plenty of water may help. Contact your healthcare professional if the problem does not go away or is severe. If you take migraine medicines for 10 or more days a month, your migraines may get worse. Keep a diary of headache days and medicine use. Contact your healthcare professional if your migraine attacks occur more frequently. What side effects may I notice from receiving this medicine? Side effects that you should report to your doctor or health care professional as soon as possible: -allergic reactions like skin rash, itching or hives, swelling of the face, lips, or tongue -chest pain or chest  tightness -signs and symptoms of a dangerous change in heartbeat or heart rhythm like chest pain; dizziness; fast, irregular heartbeat; palpitations; feeling faint or lightheaded; falls; breathing problems -signs and symptoms of a stroke like changes in vision; confusion; trouble speaking or understanding; severe headaches; sudden numbness or weakness of the face, arm or leg; trouble walking; dizziness; loss of balance or coordination -signs and symptoms of serotonin syndrome like irritable; confusion; diarrhea; fast or irregular heartbeat; muscle twitching; stiff muscles; trouble walking; sweating; high fever; seizures; chills; vomiting Side effects that  usually do not require medical attention (report to your doctor or health care professional if they continue or are bothersome): -diarrhea -dizziness -drowsiness -dry mouth -headache -nausea, vomiting -pain, tingling, numbness in the hands or feet -stomach pain This list may not describe all possible side effects. Call your doctor for medical advice about side effects. You may report side effects to FDA at 1-800-FDA-1088. Where should I keep my medicine? Keep out of the reach of children. Store at room temperature between 15 and 30 degrees C (59 and 86 degrees F). Keep container tightly closed. Throw away any unused medicine after the expiration date. NOTE: This sheet is a summary. It may not cover all possible information. If you have questions about this medicine, talk to your doctor, pharmacist, or health care provider.  2019 Elsevier/Gold Standard (2017-07-18 14:59:59)

## 2018-07-12 NOTE — Progress Notes (Signed)
Subjective:     Patient ID: Deanna Hughes, female   DOB: 01/30/1990, 28 y.o.   MRN: [id number]   Location of Patient: Home Location of Provider: Telehealth Consent was obtain for visit to be over via telehealth. A video enabled telemedicine application was used and I verified that I am speaking with the correct person using two identifiers.   Deanna Hughes presents for my chart visit regarding migraines migraines Today Deanna Hughes reports that she is starting to have 3-4 migraines a week.  Some of them are classified as just headaches.  She reports that they last for a few hours to a few days.  She reports that she is tried treating with Tylenol and ibuprofen.  She says that they are located in the frontal-middle area, all over her head.  Reports the pain as throbbing and stabbing.  Reports occasional nausea but no vomiting.  Reports photosensitivity.  And phono sensitivity. She has not been able to truly identify any triggers but reports that they are worse with her menstrual cycle.  Additionally she reports that she does wake up with them at times.  Does not have a current history of high blood pressure or eye trouble.  Denies having vision changes or dizziness. Reports that she is willing to try medications and to see a neurologist for further evaluation if medication started on doesn't help.   She denies having cough, fever, chills, shortness of breath or any other signs or symptoms of infection or being around anybody that has been positive for COVID.  Past Medical, Surgical, Social History, Allergies, and Medications have been Reviewed.   Past Medical History:  Diagnosis Date  . BV (bacterial vaginosis) 04/02/2015  . Chlamydia    treated 6/6  . Chronic back pain   . Chronic headaches    MIGRAINES  . Contraceptive management 01/25/2013  . Depression   . Encounter for Nexplanon removal 02/27/2015  . Irregular bleeding 06/03/2013  . Miscarriage 03/27/2013  . Nexplanon  insertion 04/11/2013   nexplanon inserted 04/11/13 remove 3/32/18  . Right sided abdominal pain   . Scoliosis   . Seasonal allergies 07/03/2012  . Sinus infection 08/27/2013  . Supervision of normal pregnancy 08/27/2015    [facility] Initiated Care at   7+5 weeks  FOB  Dating By  LMP  Pap  GC/CT Initial:                36+wks: Genetic Screen NT/IT:  CF screen  Anatomic [place]  Flu vaccine  Tdap Recommended ~ 28wks Glucose Screen  2 hr GBS  Feed Preference  Contraception  Circumcision  Childbirth Classes  Pediatrician    . Threatened abortion in early pregnancy 03/20/2013  . Vaginal discharge 10/03/2012  . Yeast infection 07/03/2012   Past Surgical History:  Procedure Laterality Date  . INGUINAL HERNIA REPAIR Right 04/19/2017   Procedure: HERNIA REPAIR INGUINAL ADULT;  Surgeon: Robert Bellow, MD;  Location: [facility] ORS;  Service: General;  Laterality: Right;  . WISDOM TOOTH EXTRACTION     Social History   Socioeconomic History  . Marital status: Single    Spouse name: Not on file  . Number of children: Not on file  . Years of education: Not on file  . Highest education level: Not on file  Occupational History    Comment: Deanna Hughes, cafeteria  Social Needs  . Financial resource strain: Not hard at all  . Food insecurity    Worry: Never  true    Inability: Never true  . Transportation needs    Medical: No    Non-medical: No  Tobacco Use  . Smoking status: Never Smoker  . Smokeless tobacco: Never Used  Substance and Sexual Activity  . Alcohol use: No  . Drug use: No  . Sexual activity: Yes    Birth control/protection: Implant  Lifestyle  . Physical activity    Days per week: 0 days    Minutes per session: 0 min  . Stress: To some extent  Relationships  . Social connections    Talks on phone: More than three times a week    Gets together: More than three times a week    Attends religious service: Never    Active member of club or organization: No    Attends meetings of  clubs or organizations: Never    Relationship status: Never married  . Intimate partner violence    Fear of current or ex partner: No    Emotionally abused: No    Physically abused: No    Forced sexual activity: No  Other Topics Concern  . Not on file  Social History Narrative   Lives with her 3 children      Deanna Hughes (8)    Deanna Hughes (6)      Deanna Hughes (1)- Daddy is present in life.        Outpatient Encounter Medications as of 07/12/2018  Medication Sig  . acetaminophen (TYLENOL) 500 MG tablet Take 1,000 mg by mouth 3 (three) times daily as needed for moderate pain or headache.   Marland Kitchen amitriptyline (ELAVIL) 10 MG tablet Take 1 tablet (10 mg total) by mouth at bedtime.  . busPIRone (BUSPAR) 5 MG tablet Take 1 tablet (5 mg total) by mouth 2 (two) times daily.  . cyclobenzaprine (FLEXERIL) 5 MG tablet Take 1 tablet (5 mg total) by mouth 3 (three) times daily as needed for muscle spasms.  Marland Kitchen etonogestrel (NEXPLANON) 68 MG IMPL implant 1 each once by Subdermal route.  . fluticasone (FLONASE) 50 MCG/ACT nasal spray Place 2 sprays into both nostrils daily as needed for allergies.  . hydrocortisone (ANUSOL-HC) 25 MG suppository Place 1 suppository (25 mg total) rectally every 12 (twelve) hours.  . megestrol (MEGACE) 40 MG tablet 3 tablets a day for 5 days, 2 tablets a day for 5 days then 1 tablet daily  . Multiple Vitamin (MULTIVITAMIN) tablet Take 1 tablet by mouth daily.  Marland Kitchen UNABLE TO FIND Place 1 suppository vaginally 3 (three) times a week. Med Name: Boric Acid Vaginal Suppository   No facility-administered encounter medications on file as of 07/12/2018.    Allergies  Allergen Reactions  . Aspirin Shortness Of Breath and Other (See Comments)    Heart beats fast  . Advil [Ibuprofen] Other (See Comments)    Breakout, heart palpitations and trouble breathing. Cant take Advil but can take Ibuprofen  . Latex Rash    Review of Systems  Constitutional: Negative for activity change, appetite  change, chills and fever.  HENT: Negative.   Eyes: Negative.   Respiratory: Negative.   Cardiovascular: Negative.   Gastrointestinal: Negative.   Endocrine: Negative.   Genitourinary: Negative.   Musculoskeletal: Negative.   Skin: Negative.   Allergic/Immunologic: Negative.   Neurological: Positive for headaches. Negative for dizziness, seizures, speech difficulty, weakness and numbness.  Hematological: Negative.   All other systems reviewed and are negative.      Objective:     Ht 5\' 2"  (1.575 m)  Wt 171 lb (77.6 kg)   BMI 31.28 kg/m   Physical Exam Constitutional:      General: She is awake.     Appearance: Normal appearance. She is well-developed, well-groomed and overweight.  HENT:     Head: Normocephalic and atraumatic.     Right Ear: External ear normal.     Left Ear: External ear normal.     Nose: Nose normal.  Eyes:     General:        Right eye: No discharge.        Left eye: No discharge.     Conjunctiva/sclera: Conjunctivae normal.  Neck:     Musculoskeletal: Normal range of motion.  Pulmonary:     Comments: No noted shortness of breath during communication Musculoskeletal: Normal range of motion.  Neurological:     Mental Status: She is alert and oriented to person, place, and time.  Psychiatric:        Mood and Affect: Mood normal.        Behavior: Behavior normal. Behavior is cooperative.        Thought Content: Thought content normal.        Judgment: Judgment normal.        Assessment and Plan        1. Migraine without aura and without status migrainosus, not intractable Has started low-dose Elavil back in March without relief.  Will increase to the higher dose 50 mg once at bedtime.  We will look to increase by 25 mg if this improves but does not take away most of her headaches.  Additionally was prescribed Maxalt for breakthrough headaches. Reviewed side effects, risks and benefits of medication.  Provided with medication on this new  medicine in addition to him information on migraines as well.  Encouraged to try to keep a diary or journal to help identify triggers as well.  At this time she does not seem to have an aura.  Patient acknowledged agreement and understanding of the plan.    - amitriptyline (ELAVIL) 50 MG tablet; Take 1 tablet (50 mg total) by mouth at bedtime.  Dispense: 30 tablet; Refill: 1 - rizatriptan (MAXALT) 5 MG tablet; Take 1 tablet (5 mg total) by mouth as needed for migraine. May repeat in 2 hours if needed, do not go over 30 mg in 24 hours  Dispense: 10 tablet; Refill: 0   Follow Up: 4 weeks    I provided 10 minutes of non-face-to-face time during this encounter.      Perlie Mayo, DNP, AGNP-BC [place], [address] [place], [place] [address] Office Hours: Mon-Thurs 8 am-5 pm; Fri 8 am-12 pm Office Phone:  [phone]  Office Fax: [phone]

## 2018-07-23 ENCOUNTER — Encounter: Payer: Self-pay | Admitting: Family Medicine

## 2018-07-30 ENCOUNTER — Encounter: Payer: Self-pay | Admitting: Family Medicine

## 2018-07-31 ENCOUNTER — Telehealth: Payer: Self-pay | Admitting: *Deleted

## 2018-07-31 NOTE — Telephone Encounter (Signed)
[employer] FMLA received Copied  Noted [place]ed

## 2018-08-02 ENCOUNTER — Ambulatory Visit: Payer: 59 | Admitting: Family Medicine

## 2018-08-03 ENCOUNTER — Encounter: Payer: Self-pay | Admitting: Family Medicine

## 2018-08-03 ENCOUNTER — Other Ambulatory Visit: Payer: Self-pay

## 2018-08-03 ENCOUNTER — Ambulatory Visit (INDEPENDENT_AMBULATORY_CARE_PROVIDER_SITE_OTHER): Payer: 59 | Admitting: Family Medicine

## 2018-08-03 VITALS — BP 100/62 | HR 83 | Temp 99.0°F | Ht 62.0 in | Wt 176.0 lb

## 2018-08-03 DIAGNOSIS — M25473 Effusion, unspecified ankle: Secondary | ICD-10-CM

## 2018-08-03 DIAGNOSIS — T148XXA Other injury of unspecified body region, initial encounter: Secondary | ICD-10-CM

## 2018-08-03 LAB — COMPLETE METABOLIC PANEL WITH GFR
AG Ratio: 1.9 (calc) (ref 1.0–2.5)
ALT: 11 U/L (ref 6–29)
AST: 11 U/L (ref 10–30)
Albumin: 4.5 g/dL (ref 3.6–5.1)
Alkaline phosphatase (APISO): 68 U/L (ref 31–125)
BUN: 10 mg/dL (ref 7–25)
CO2: 26 mmol/L (ref 20–32)
Calcium: 9.6 mg/dL (ref 8.6–10.2)
Chloride: 105 mmol/L (ref 98–110)
Creat: 0.63 mg/dL (ref 0.50–1.10)
GFR, Est African American: 144 mL/min/{1.73_m2} (ref >=60)
GFR, Est Non African American: 125 mL/min/{1.73_m2} (ref >=60)
Globulin: 2.4 g/dL (calc) (ref 1.9–3.7)
Glucose, Bld: 82 mg/dL (ref 65–139)
Potassium: 4.2 mmol/L (ref 3.5–5.3)
Sodium: 137 mmol/L (ref 135–146)
Total Bilirubin: 0.6 mg/dL (ref 0.2–1.2)
Total Protein: 6.9 g/dL (ref 6.1–8.1)

## 2018-08-03 LAB — CBC WITH DIFFERENTIAL/PLATELET
Absolute Monocytes: 515 cells/uL (ref 200–950)
Basophils Absolute: 19 cells/uL (ref 0–200)
Basophils Relative: 0.3 %
Eosinophils Absolute: 68 cells/uL (ref 15–500)
Eosinophils Relative: 1.1 %
HCT: 40 % (ref 35.0–45.0)
Hemoglobin: 13.6 g/dL (ref 11.7–15.5)
Lymphs Abs: 2108 cells/uL (ref 850–3900)
MCH: 29.7 pg (ref 27.0–33.0)
MCHC: 34 g/dL (ref 32.0–36.0)
MCV: 87.3 fL (ref 80.0–100.0)
MPV: 12.6 fL — ABNORMAL HIGH (ref 7.5–12.5)
Monocytes Relative: 8.3 %
Neutro Abs: 3491 cells/uL (ref 1500–7800)
Neutrophils Relative %: 56.3 %
Platelets: 256 10*3/uL (ref 140–400)
RBC: 4.58 10*6/uL (ref 3.80–5.10)
RDW: 12.2 % (ref 11.0–15.0)
Total Lymphocyte: 34 %
WBC: 6.2 10*3/uL (ref 3.8–10.8)

## 2018-08-03 NOTE — Patient Instructions (Signed)
Thank you for coming into the office today. I appreciate the opportunity to provide you with the care for your health and wellness. Today we discussed: leg swelling   Follow Up: 3 months (might need to change appt from 7/23)  Labs today.  Compression 10-20 mmhg Keep legs elevated after work and when sitting.  Please continue to practice social distancing to keep you, your family, and our community safe.  If you must go out, please wear a Mask and practice good handwashing.  [place] YOUR HANDS WELL AND FREQUENTLY. AVOID TOUCHING YOUR FACE, UNLESS YOUR HANDS ARE FRESHLY WASHED.   GET FRESH AIR DAILY. STAY HYDRATED WITH WATER.   It was a pleasure to see you and I look forward to continuing to work together on your health and well-being. Please do not hesitate to call the office if you need care or have questions about your care.  Have a wonderful day and week.  With Gratitude,  Cherly Beach, DNP, AGNP-BC     How to Use Compression Stockings Compression stockings are elastic socks that squeeze the legs. They help increase blood flow (circulation) to the legs, decrease swelling in the legs, and reduce the chance of developing blood clots in the lower legs. Compression stockings are often used by people who:  Are recovering from surgery.  Have poor circulation in their legs.  Tend to get blood clots in their legs.  Have bulging (varicose) veins.  Sit or stay in bed for long periods of time. Follow instructions from your health care provider about how and when to wear your compression stockings. How to wear compression stockings Before you put on your compression stockings:  Make sure that they are the correct size and degree of compression. If you do not know your size or required grade of compression, ask your health care provider and follow the manufacturer's instructions that come with the stockings.  Make sure that they are clean, dry, and in good condition.   Check them for rips and tears. Do not put them on if they are ripped or torn. Put your stockings on first thing in the morning, before you get out of bed. Keep them on for as long as your health care provider advises. When you are wearing your stockings:  Keep them as smooth as possible. Do not allow them to bunch up. It is especially important to prevent the stockings from bunching up around your toes or behind your knees.  Do not roll the stockings downward and leave them rolled down. This can decrease blood flow to your leg.  Change them right away if they become wet or dirty. When you take off your stockings, inspect your legs and feet. Check for:  Open sores.  Red spots.  Swelling. General tips  Do not stop wearing compression stockings without talking to your health care provider first.  Wash your stockings every day with mild detergent in cold or warm water. Do not use bleach. Air-dry your stockings or dry them in a clothes dryer on low heat. It may be helpful to have two pairs so that you have a pair to wear while the other is being washed.  Replace your stockings every 3-6 months.  If skin moisturizing is part of your treatment plan, apply lotion or cream at night so that your skin will be dry when you put on the stockings in the morning. It is harder to put the stockings on when you have lotion on your legs  or feet.  Wear nonskid shoes or slip-resistant socks when walking while wearing compression stockings. Contact a health care provider and remove your stockings if you have:  A feeling of pins and needles in your feet or legs.  Open sores, red spots, or other skin changes on your feet or legs.  Swelling or pain that gets worse. Get help right away if you have:  Numbness or tingling in your lower legs that does not get better right after you take the stockings off.  Toes or feet that are unusually cold or turn a bluish color.  A warm or red area on your leg.  New  swelling or soreness in your leg.  Shortness of breath.  Chest pain.  A fast or irregular heartbeat.  Light-headedness.  Dizziness. Summary  Compression stockings are elastic socks that squeeze the legs.  They help increase blood flow (circulation) to the legs, decrease swelling in the legs, and reduce the chance of developing blood clots in the lower legs.  Follow instructions from your health care provider about how and when to wear your compression stockings.  Do not stop wearing your compression stockings without talking to your health care provider first. This information is not intended to replace advice given to you by your health care provider. Make sure you discuss any questions you have with your health care provider. Document Released: 10/31/2008 Document Revised: 01/05/2017 Document Reviewed: 01/05/2017 Elsevier Patient Education  2020 [employer].

## 2018-08-03 NOTE — Progress Notes (Signed)
Subjective:     Patient ID: Deanna Hughes, female   DOB: Jul 10, 1990, 28 y.o.   MRN: [id number]  Deanna Hughes presents for Foot Swelling (bilateral foot swelling x's 1 month)  Today Deanna Hughes reports foot swelling over the last month. She denies injuries. Reports it is mostly her ankles and she has some bruising with it. She stands at work. But does not think it is related. She reports having good shoes on, but does not wear compression. Has no hx of vascular issues. No hx of congential heart issues.  Today patient denies signs and symptoms of COVID 19 infection including fever, chills, cough, shortness of breath, and headache.  Past Medical, Surgical, Social History, Allergies, and Medications have been Reviewed.   Past Medical History:  Diagnosis Date  . BV (bacterial vaginosis) 04/02/2015  . Chlamydia    treated 6/6  . Chronic back pain   . Chronic headaches    MIGRAINES  . Contraceptive management 01/25/2013  . Depression   . Encounter for Nexplanon removal 02/27/2015  . Irregular bleeding 06/03/2013  . Miscarriage 03/27/2013  . Nexplanon insertion 04/11/2013   nexplanon inserted 04/11/13 remove 3/32/18  . Right sided abdominal pain   . Scoliosis   . Seasonal allergies 07/03/2012  . Sinus infection 08/27/2013  . Supervision of normal pregnancy 08/27/2015    [facility] Initiated Care at   7+5 weeks  FOB  Dating By  LMP  Pap  GC/CT Initial:                36+wks: Genetic Screen NT/IT:  CF screen  Anatomic [place]  Flu vaccine  Tdap Recommended ~ 28wks Glucose Screen  2 hr GBS  Feed Preference  Contraception  Circumcision  Childbirth Classes  Pediatrician    . Threatened abortion in early pregnancy 03/20/2013  . Vaginal discharge 10/03/2012  . Yeast infection 07/03/2012   Past Surgical History:  Procedure Laterality Date  . INGUINAL HERNIA REPAIR Right 04/19/2017   Procedure: HERNIA REPAIR INGUINAL ADULT;  Surgeon: Robert Bellow, MD;  Location: [facility] ORS;  Service: General;   Laterality: Right;  . WISDOM TOOTH EXTRACTION     Social History   Socioeconomic History  . Marital status: Single    Spouse name: Not on file  . Number of children: Not on file  . Years of education: Not on file  . Highest education level: Not on file  Occupational History    Comment: [employer], cafeteria  Social Needs  . Financial resource strain: Not hard at all  . Food insecurity    Worry: Never true    Inability: Never true  . Transportation needs    Medical: No    Non-medical: No  Tobacco Use  . Smoking status: Never Smoker  . Smokeless tobacco: Never Used  Substance and Sexual Activity  . Alcohol use: No  . Drug use: No  . Sexual activity: Yes    Birth control/protection: Implant  Lifestyle  . Physical activity    Days per week: 0 days    Minutes per session: 0 min  . Stress: To some extent  Relationships  . Social connections    Talks on phone: More than three times a week    Gets together: More than three times a week    Attends religious service: Never    Active member of club or organization: No    Attends meetings of clubs or organizations: Never    Relationship status:  Never married  . Intimate partner violence    Fear of current or ex partner: No    Emotionally abused: No    Physically abused: No    Forced sexual activity: No  Other Topics Concern  . Not on file  Social History Narrative   Lives with her 3 children      Deanna Hughes (8)    Deanna Hughes (6)      Deanna Hughes (1)- Daddy is present in life.        Outpatient Encounter Medications as of 08/03/2018  Medication Sig  . acetaminophen (TYLENOL) 500 MG tablet Take 1,000 mg by mouth 3 (three) times daily as needed for moderate pain or headache.   Marland Kitchen amitriptyline (ELAVIL) 50 MG tablet Take 1 tablet (50 mg total) by mouth at bedtime.  . busPIRone (BUSPAR) 5 MG tablet Take 1 tablet (5 mg total) by mouth 2 (two) times daily.  . cyclobenzaprine (FLEXERIL) 5 MG tablet Take 1 tablet (5 mg total) by mouth 3  (three) times daily as needed for muscle spasms.  Marland Kitchen etonogestrel (NEXPLANON) 68 MG IMPL implant 1 each once by Subdermal route.  . fluticasone (FLONASE) 50 MCG/ACT nasal spray Place 2 sprays into both nostrils daily as needed for allergies.  . hydrocortisone (ANUSOL-HC) 25 MG suppository Place 1 suppository (25 mg total) rectally every 12 (twelve) hours.  . megestrol (MEGACE) 40 MG tablet 3 tablets a day for 5 days, 2 tablets a day for 5 days then 1 tablet daily  . Multiple Vitamin (MULTIVITAMIN) tablet Take 1 tablet by mouth daily.  . rizatriptan (MAXALT) 5 MG tablet Take 1 tablet (5 mg total) by mouth as needed for migraine. May repeat in 2 hours if needed, do not go over 30 mg in 24 hours  . UNABLE TO FIND Place 1 suppository vaginally 3 (three) times a week. Med Name: Boric Acid Vaginal Suppository   No facility-administered encounter medications on file as of 08/03/2018.    Allergies  Allergen Reactions  . Aspirin Shortness Of Breath and Other (See Comments)    Heart beats fast  . Advil [Ibuprofen] Other (See Comments)    Breakout, heart palpitations and trouble breathing. Cant take Advil but can take Ibuprofen  . Latex Rash    Review of Systems  Constitutional: Negative for chills and fever.  HENT: Negative.   Eyes: Negative for visual disturbance.  Respiratory: Negative for cough and shortness of breath.   Gastrointestinal: Negative.   Endocrine: Negative.   Genitourinary: Negative.   Musculoskeletal: Positive for joint swelling.  Skin: Negative.   Allergic/Immunologic: Negative.   Neurological: Positive for headaches. Negative for dizziness.  Hematological: Bruises/bleeds easily.  Psychiatric/Behavioral: Negative.   All other systems reviewed and are negative.      Objective:     BP 100/62   Pulse 83   Temp 99 F (37.2 C)   Ht 5\' 2"  (1.575 m)   Wt 176 lb (79.8 kg)   SpO2 99%   BMI 32.19 kg/m   Physical Exam Constitutional:      Appearance: Normal  appearance. She is obese.  HENT:     Head: Normocephalic and atraumatic.     Right Ear: External ear normal.     Left Ear: External ear normal.     Nose: Nose normal.  Eyes:     General:        Right eye: No discharge.        Left eye: No discharge.     Conjunctiva/sclera:  Conjunctivae normal.  Neck:     Musculoskeletal: Normal range of motion and neck supple.  Cardiovascular:     Rate and Rhythm: Normal rate and regular rhythm.     Pulses: Normal pulses.          Dorsalis pedis pulses are 2+ on the right side and 2+ on the left side.       Posterior tibial pulses are 2+ on the right side and 2+ on the left side.     Heart sounds: Normal heart sounds.  Pulmonary:     Effort: Pulmonary effort is normal.     Breath sounds: Normal breath sounds.  Musculoskeletal: Normal range of motion.     Right lower leg: Edema present.     Left lower leg: Edema present.  Skin:    General: Skin is warm and dry.     Capillary Refill: Capillary refill takes less than 2 seconds.  Neurological:     Mental Status: She is alert and oriented to person, place, and time.  Psychiatric:        Mood and Affect: Mood normal.        Behavior: Behavior normal.        Thought Content: Thought content normal.        Judgment: Judgment normal.        Assessment and Plan         1. Ankle swelling, unspecified laterality Mild edema bilaterally could be from dependency and standing at work. Advised to get compression hose. And to elevate legs when sitting. And to walk frequently Will check kidneys  - COMPLETE METABOLIC PANEL WITH GFR  2. Bruising A few bruises she denies being related to shoes or types of socks or injury. Will check blood levels. No bruising outside the ankle area.  - CBC with Differential/Platelet   Follow Up:  3 months or PRN   Perlie Mayo, DNP, AGNP-BC [place], [address], [place] [address] Office  Hours: Mon-Thurs 8 am-5 pm; Fri 8 am-12 pm Office Phone:  [phone]  Office Fax: [phone]

## 2018-08-06 NOTE — Progress Notes (Signed)
Labs are great! Liver, kidney, and blood levels are stable.

## 2018-08-08 DIAGNOSIS — G43009 Migraine without aura, not intractable, without status migrainosus: Secondary | ICD-10-CM

## 2018-08-09 ENCOUNTER — Ambulatory Visit: Payer: 59 | Admitting: Family Medicine

## 2018-08-13 ENCOUNTER — Encounter: Payer: Self-pay | Admitting: Family Medicine

## 2018-08-21 ENCOUNTER — Other Ambulatory Visit: Payer: Self-pay | Admitting: Internal Medicine

## 2018-08-21 ENCOUNTER — Telehealth: Payer: Self-pay | Admitting: Student

## 2018-08-21 DIAGNOSIS — Z20822 Contact with and (suspected) exposure to covid-19: Secondary | ICD-10-CM

## 2018-08-21 NOTE — Telephone Encounter (Signed)
PT called said that the HR lady said she has not received anything regarding FMLA and the correction.  Please reach out PT

## 2018-08-21 NOTE — Telephone Encounter (Signed)
Left a message letting patient know paperwork with changes had been faxed on the same day changes were made but we would refax them. Paperwork re-faxed

## 2018-08-28 ENCOUNTER — Other Ambulatory Visit: Payer: Self-pay | Admitting: Women's Health

## 2018-08-28 ENCOUNTER — Telehealth: Payer: Self-pay | Admitting: Obstetrics & Gynecology

## 2018-08-28 MED ORDER — METRONIDAZOLE 500 MG PO TABS: 500.0000 mg | ORAL_TABLET | Freq: Two times a day (BID) | ORAL | 0 refills | Status: DC

## 2018-08-28 NOTE — Telephone Encounter (Signed)
Pt has a vaginal discharge, fishy smell, itching. Pt has had BV before. Pt is requesting something to help but don't want cream. Would rather have a pill. Please advise. Thanks!! [place]

## 2018-08-28 NOTE — Telephone Encounter (Signed)
Pt states she believes she has a yeast infection and is wanting to see if she needs to come in or if something can be called in for her.

## 2018-09-11 NOTE — Patient Instructions (Signed)
Deanna Hughes  09/11/2018     @PREFPERIOPPHARMACY @   Your procedure is scheduled on  09/18/2018 .  Report to [employer] [facility] at  0700  A.M.  Call this number if you have problems the morning of surgery:  [phone]   Remember:  Follow the diet and prep instructions given to you by Dr Nona Dell office.                    Take these medicines the morning of surgery with A SIP OF WATER  Buspar, flexaril(if needed), maxalt(if needed).    Do not wear jewelry, make-up or nail polish.  Do not wear lotions, powders, or perfumes. Please wear doedorant and brush your teeth.  Do not shave 48 hours prior to surgery.  Men may shave face and neck.  Do not bring valuables to the hospital.  [facility] is not responsible for any belongings or valuables.  Contacts, dentures or bridgework may not be worn into surgery.  Leave your suitcase in the car.  After surgery it may be brought to your room.  For patients admitted to the hospital, discharge time will be determined by your treatment team.  Patients discharged the day of surgery will not be allowed to drive home.   Name and phone number of your driver:   family Special instructions:  None  Please read over the following fact sheets that you were given. Anesthesia Post-op Instructions and Care and Recovery After Surgery       Colonoscopy, Adult, Care After This sheet gives you information about how to care for yourself after your procedure. Your health care provider may also give you more specific instructions. If you have problems or questions, contact your health care provider. What can I expect after the procedure? After the procedure, it is common to have:  A small amount of blood in your stool for 24 hours after the procedure.  Some gas.  Mild abdominal cramping or bloating. Follow these instructions at home: General instructions  For the first 24 hours after the procedure: ? Do not drive or use machinery. ? Do not  sign important documents. ? Do not drink alcohol. ? Do your regular daily activities at a slower pace than normal. ? Eat soft, easy-to-digest foods.  Take over-the-counter or prescription medicines only as told by your health care provider. Relieving cramping and bloating   Try walking around when you have cramps or feel bloated.  Apply heat to your abdomen as told by your health care provider. Use a heat source that your health care provider recommends, such as a moist heat pack or a heating pad. ? Place a towel between your skin and the heat source. ? Leave the heat on for 20-30 minutes. ? Remove the heat if your skin turns bright red. This is especially important if you are unable to feel pain, heat, or cold. You may have a greater risk of getting burned. Eating and drinking   Drink enough fluid to keep your urine pale yellow.  Resume your normal diet as instructed by your health care provider. Avoid heavy or fried foods that are hard to digest.  Avoid drinking alcohol for as long as instructed by your health care provider. Contact a health care provider if:  You have blood in your stool 2-3 days after the procedure. Get help right away if:  You have more than a small spotting of blood in your stool.  You  pass large blood clots in your stool.  Your abdomen is swollen.  You have nausea or vomiting.  You have a fever.  You have increasing abdominal pain that is not relieved with medicine. Summary  After the procedure, it is common to have a small amount of blood in your stool. You may also have mild abdominal cramping and bloating.  For the first 24 hours after the procedure, do not drive or use machinery, sign important documents, or drink alcohol.  Contact your health care provider if you have a lot of blood in your stool, nausea or vomiting, a fever, or increased abdominal pain. This information is not intended to replace advice given to you by your health care  provider. Make sure you discuss any questions you have with your health care provider. Document Released: 08/18/2003 Document Revised: 10/26/2016 Document Reviewed: 03/17/2015 Elsevier Patient Education  2020 [place] After These instructions provide you with information about caring for yourself after your procedure. Your health care provider may also give you more specific instructions. Your treatment has been planned according to current medical practices, but problems sometimes occur. Call your health care provider if you have any problems or questions after your procedure. What can I expect after the procedure? After your procedure, you may:  Feel sleepy for several hours.  Feel clumsy and have poor balance for several hours.  Feel forgetful about what happened after the procedure.  Have poor judgment for several hours.  Feel nauseous or vomit.  Have a sore throat if you had a breathing tube during the procedure. Follow these instructions at home: For at least 24 hours after the procedure:      Have a responsible adult stay with you. It is important to have someone help care for you until you are awake and alert.  Rest as needed.  Do not: ? Participate in activities in which you could fall or become injured. ? Drive. ? Use heavy machinery. ? Drink alcohol. ? Take sleeping pills or medicines that cause drowsiness. ? Make important decisions or sign legal documents. ? Take care of children on your own. Eating and drinking  Follow the diet that is recommended by your health care provider.  If you vomit, drink water, juice, or soup when you can drink without vomiting.  Make sure you have little or no nausea before eating solid foods. General instructions  Take over-the-counter and prescription medicines only as told by your health care provider.  If you have sleep apnea, surgery and certain medicines can increase your risk for  breathing problems. Follow instructions from your health care provider about wearing your sleep device: ? Anytime you are sleeping, including during daytime naps. ? While taking prescription pain medicines, sleeping medicines, or medicines that make you drowsy.  If you smoke, do not smoke without supervision.  Keep all follow-up visits as told by your health care provider. This is important. Contact a health care provider if:  You keep feeling nauseous or you keep vomiting.  You feel light-headed.  You develop a rash.  You have a fever. Get help right away if:  You have trouble breathing. Summary  For several hours after your procedure, you may feel sleepy and have poor judgment.  Have a responsible adult stay with you for at least 24 hours or until you are awake and alert. This information is not intended to replace advice given to you by your health care provider. Make sure you discuss  any questions you have with your health care provider. Document Released: 04/26/2015 Document Revised: 04/03/2017 Document Reviewed: 04/26/2015 Elsevier Patient Education  2020 [employer].

## 2018-09-14 ENCOUNTER — Encounter (HOSPITAL_COMMUNITY): Payer: Self-pay

## 2018-09-14 ENCOUNTER — Other Ambulatory Visit: Payer: Self-pay

## 2018-09-14 ENCOUNTER — Other Ambulatory Visit (HOSPITAL_COMMUNITY)
Admission: RE | Admit: 2018-09-14 | Discharge: 2018-09-14 | Disposition: A | Discharge status: Home / Self Care | Payer: 59 | Source: Ambulatory Visit | Attending: Gastroenterology | Admitting: Gastroenterology

## 2018-09-14 ENCOUNTER — Encounter (HOSPITAL_COMMUNITY)
Admission: RE | Admit: 2018-09-14 | Discharge: 2018-09-14 | Disposition: A | Discharge status: Home / Self Care | Payer: 59 | Source: Ambulatory Visit | Attending: Gastroenterology | Admitting: Gastroenterology

## 2018-09-14 DIAGNOSIS — Z01812 Encounter for preprocedural laboratory examination: Secondary | ICD-10-CM | POA: Diagnosis not present

## 2018-09-14 DIAGNOSIS — Z20828 Contact with and (suspected) exposure to other viral communicable diseases: Secondary | ICD-10-CM | POA: Diagnosis not present

## 2018-09-14 HISTORY — DX: Anxiety disorder, unspecified: F41.9

## 2018-09-14 LAB — CBC
HCT: 38.6 % (ref 36.0–46.0)
Hemoglobin: 12.6 g/dL (ref 12.0–15.0)
MCH: 29.4 pg (ref 26.0–34.0)
MCHC: 32.6 g/dL (ref 30.0–36.0)
MCV: 90.2 fL (ref 80.0–100.0)
Platelets: 201 10*3/uL (ref 150–400)
RBC: 4.28 MIL/uL (ref 3.87–5.11)
RDW: 12.5 % (ref 11.5–15.5)
WBC: 7.5 10*3/uL (ref 4.0–10.5)
nRBC: 0 % (ref 0.0–0.2)

## 2018-09-14 LAB — SARS CORONAVIRUS 2 (TAT 6-24 HRS): SARS Coronavirus 2: NEGATIVE

## 2018-09-14 LAB — HCG, SERUM, QUALITATIVE: Preg, Serum: NEGATIVE

## 2018-09-17 NOTE — Progress Notes (Signed)
CC'D TO PCP °

## 2018-09-18 ENCOUNTER — Other Ambulatory Visit: Payer: Self-pay

## 2018-09-18 ENCOUNTER — Encounter (HOSPITAL_COMMUNITY): Payer: Self-pay

## 2018-09-18 ENCOUNTER — Encounter (HOSPITAL_COMMUNITY)
Admission: RE | Disposition: A | Discharge status: Home / Self Care | Payer: Self-pay | Source: Home / Self Care | Attending: Gastroenterology

## 2018-09-18 ENCOUNTER — Ambulatory Visit (HOSPITAL_COMMUNITY): Payer: 59 | Admitting: Anesthesiology

## 2018-09-18 ENCOUNTER — Ambulatory Visit (HOSPITAL_COMMUNITY)
Admission: RE | Admit: 2018-09-18 | Discharge: 2018-09-18 | Disposition: A | Discharge status: Home / Self Care | Payer: 59 | Attending: Gastroenterology | Admitting: Gastroenterology

## 2018-09-18 DIAGNOSIS — G43909 Migraine, unspecified, not intractable, without status migrainosus: Secondary | ICD-10-CM | POA: Insufficient documentation

## 2018-09-18 DIAGNOSIS — K644 Residual hemorrhoidal skin tags: Secondary | ICD-10-CM | POA: Diagnosis not present

## 2018-09-18 DIAGNOSIS — K625 Hemorrhage of anus and rectum: Secondary | ICD-10-CM | POA: Diagnosis not present

## 2018-09-18 DIAGNOSIS — F419 Anxiety disorder, unspecified: Secondary | ICD-10-CM | POA: Insufficient documentation

## 2018-09-18 DIAGNOSIS — Z79818 Long term (current) use of other agents affecting estrogen receptors and estrogen levels: Secondary | ICD-10-CM | POA: Diagnosis not present

## 2018-09-18 DIAGNOSIS — Q439 Congenital malformation of intestine, unspecified: Secondary | ICD-10-CM | POA: Insufficient documentation

## 2018-09-18 DIAGNOSIS — Z79899 Other long term (current) drug therapy: Secondary | ICD-10-CM | POA: Insufficient documentation

## 2018-09-18 DIAGNOSIS — K921 Melena: Secondary | ICD-10-CM | POA: Diagnosis not present

## 2018-09-18 DIAGNOSIS — K648 Other hemorrhoids: Secondary | ICD-10-CM | POA: Diagnosis not present

## 2018-09-18 DIAGNOSIS — K51511 Left sided colitis with rectal bleeding: Secondary | ICD-10-CM | POA: Diagnosis not present

## 2018-09-18 DIAGNOSIS — K529 Noninfective gastroenteritis and colitis, unspecified: Secondary | ICD-10-CM | POA: Insufficient documentation

## 2018-09-18 DIAGNOSIS — K6389 Other specified diseases of intestine: Secondary | ICD-10-CM | POA: Insufficient documentation

## 2018-09-18 HISTORY — PX: BIOPSY: SHX5522

## 2018-09-18 HISTORY — PX: COLONOSCOPY WITH PROPOFOL: SHX5780

## 2018-09-18 SURGERY — COLONOSCOPY WITH PROPOFOL
Anesthesia: General

## 2018-09-18 MED ORDER — LIDOCAINE HCL (CARDIAC) PF 100 MG/5ML IV SOSY
PREFILLED_SYRINGE | INTRAVENOUS | Status: DC | PRN
  Administered 2018-09-18: 40 mg via INTRAVENOUS

## 2018-09-18 MED ORDER — CHLORHEXIDINE GLUCONATE CLOTH 2 % EX PADS: 6.0000 | MEDICATED_PAD | Freq: Once | CUTANEOUS | Status: DC

## 2018-09-18 MED ORDER — PROMETHAZINE HCL 25 MG/ML IJ SOLN: 6.2500 mg | INTRAMUSCULAR | Status: DC | PRN

## 2018-09-18 MED ORDER — LACTATED RINGERS IV SOLN
INTRAVENOUS | Status: DC
  Administered 2018-09-18: 08:00:00 via INTRAVENOUS

## 2018-09-18 MED ORDER — HYDROMORPHONE HCL 1 MG/ML IJ SOLN: 0.2500 mg | INTRAMUSCULAR | Status: DC | PRN

## 2018-09-18 MED ORDER — MIDAZOLAM HCL 2 MG/2ML IJ SOLN: 0.5000 mg | Freq: Once | INTRAMUSCULAR | Status: DC | PRN

## 2018-09-18 MED ORDER — KETAMINE HCL 10 MG/ML IJ SOLN
INTRAMUSCULAR | Status: DC | PRN
  Administered 2018-09-18 (×2): 10 mg via INTRAVENOUS

## 2018-09-18 MED ORDER — HYDROCODONE-ACETAMINOPHEN 7.5-325 MG PO TABS: 1.0000 | ORAL_TABLET | Freq: Once | ORAL | Status: DC | PRN

## 2018-09-18 MED ORDER — PROPOFOL 500 MG/50ML IV EMUL
INTRAVENOUS | Status: DC | PRN
  Administered 2018-09-18: 150 ug/kg/min via INTRAVENOUS

## 2018-09-18 MED ORDER — KETAMINE HCL 50 MG/5ML IJ SOSY
PREFILLED_SYRINGE | INTRAMUSCULAR | Status: AC
  Filled 2018-09-18: qty 5

## 2018-09-18 MED ORDER — PROPOFOL 10 MG/ML IV BOLUS
INTRAVENOUS | Status: DC | PRN
  Administered 2018-09-18: 20 mg via INTRAVENOUS

## 2018-09-18 NOTE — Discharge Instructions (Signed)
YOUR RECTAL BLEEDING IS DUE TO HEMORRHOIDS AND PROCTOCOLITIS. 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  I BIOPSIED YOUR SMALL BOWEL, COLON, AND RECTUM. I CANNOT BAND YOUR HEMORRHOID UNTIL THE BIOPSY RESULTS ARE KNOWN. IF YOU HAVE ULCERATIVE COLITIS, YOU CANNOT HAVE BANDS PLACED ON YOUR HEMORRHOIDS.   DRINK WATER TO KEEP YOUR URINE LIGHT YELLOW.  EAT TO LIVE AND THINK OF FOOD AS MEDICINE.   To have more energy, DECREASE INFLAMMATION IN YOUR COLON AND RECTUM,  and to lose weight:      1. CONTINUE YOUR WEIGHT LOSS EFFORTS. I RECOMMEND YOU READ AND FOLLOW RECOMMENDATIONS BY DR. MARK HYMAN, "10-DAY DETOX DIET".    2. If you must eat bread, EAT EZEKIEL BREAD. IT IS IN THE FROZEN SECTION OF THE GROCERY STORE.    3. Do not drink SODA, GATORADE, ENERGY DRINKS, OR DIET SODA.     4. AVOID HIGH FRUCTOSE CORN SYRUP.     5. DO NOT chew SUGAR FREE GUM OR USE ARTIFICIAL SWEETENERS. USE STEVIA AS A SWEETENER.    6. DO NOT EAT ENRICHED WHEAT FLOUR, PASTA, RICE, OR CEREAL.    7. DO NOT EAT FARM RAISED MEAT OR FISH. ONLY EAT WILD CAUGHT SEAFOOD, GRASS FED BEEF OR CHICKEN, OR EGGS FROM PASTURE RAISED CHICKENS.    8. PRACTICE CHAIR YOGA FOR 15-30 MINS 3 OR 4 TIMES A WEEK AND PROGRESS TO HATHA YOGA OVER NEXT 6 MOS.    9. START TAKING MULTIVITAMIN, VITAMIN B12, AND VITAMIN D3 2000 IU DAILY.    DO NOT USE ASPIRIN, BC/GOODY POWDERS,  OR IBUPROFEN, MOTRIN, ALEVE, OR NAPROXEN.   USE TYLENOL AS NEEDED FOR ABDOMINAL PAIN.   USE PREPARATION H FOUR TIMES  A DAY FOR 14 DAYS THEN IF NEEDED TO RELIEVE RECTAL PAIN/PRESSURE/BLEEDING.   YOUR BIOPSY RESULTS WILL BE BACK IN 5 BUSINESS DAYS.  FOLLOW UP IN 3 MOS WITH DR. Clemencia Helzer.  Colonoscopy Care After Read the instructions outlined below and refer to this sheet in the next week. These discharge instructions provide you with general information on caring for yourself after you leave the hospital. While your  treatment has been planned according to the most current medical practices available, unavoidable complications occasionally occur. If you have any problems or questions after discharge, call DR. Cable Fearn, [phone].  ACTIVITY  You may resume your regular activity, but move at a slower pace for the next 24 hours.   Take frequent rest periods for the next 24 hours.   Walking will help get rid of the air and reduce the bloated feeling in your belly (abdomen).   No driving for 24 hours (because of the medicine (anesthesia) used during the test).   You may shower.   Do not sign any important legal documents or operate any machinery for 24 hours (because of the anesthesia used during the test).    NUTRITION  Drink plenty of fluids.   You may resume your normal diet as instructed by your doctor.   Begin with a light meal and progress to your normal diet. Heavy or fried foods are harder to digest and may make you feel sick to your stomach (nauseated).   Avoid alcoholic beverages for 24 hours or as instructed.    MEDICATIONS  You may resume your normal medications.   WHAT YOU CAN EXPECT TODAY  Some feelings of bloating in the abdomen.   Passage of more gas than usual.   Spotting of blood  in your stool or on the toilet paper  .  IF YOU HAD POLYPS REMOVED DURING THE COLONOSCOPY:  No aspirin products for 7 days or as instructed.   Eat a soft diet IF YOU HAVE NAUSEA, BLOATING, ABDOMINAL PAIN, OR VOMITING.    FINDING OUT THE RESULTS OF YOUR TEST Not all test results are available during your visit. DR. Oneida Alar WILL CALL YOU WITHIN 14 DAYS OF YOUR PROCEDUE WITH YOUR RESULTS. Do not assume everything is normal if you have not heard from DR. Deaken Jurgens, CALL HER OFFICE AT [phone].  SEEK IMMEDIATE MEDICAL ATTENTION AND CALL THE OFFICE: [phone] IF:  You have more than a spotting of blood in your stool.   Your belly is swollen (abdominal distention).   You are nauseated  or vomiting.   You have a temperature over 101F.   You have abdominal pain or discomfort that is severe or gets worse throughout the day.

## 2018-09-18 NOTE — Anesthesia Preprocedure Evaluation (Signed)
Anesthesia Evaluation  Patient identified by MRN, date of birth, ID band Patient awake    Reviewed: Allergy & Precautions, NPO status , Patient's Chart, lab work & pertinent test results  Airway Mallampati: I  TM Distance: >3 FB Neck ROM: Full    Dental no notable dental hx. (+) Teeth Intact   Pulmonary neg pulmonary ROS,    Pulmonary exam normal breath sounds clear to auscultation       Cardiovascular Exercise Tolerance: Good negative cardio ROS Normal cardiovascular examI Rhythm:Regular Rate:Normal     Neuro/Psych  Headaches, Anxiety negative psych ROS   GI/Hepatic negative GI ROS, Neg liver ROS,   Endo/Other  negative endocrine ROS  Renal/GU negative Renal ROS  negative genitourinary   Musculoskeletal negative musculoskeletal ROS (+)   Abdominal   Peds negative pediatric ROS (+)  Hematology negative hematology ROS (+)   Anesthesia Other Findings   Reproductive/Obstetrics negative OB ROS                             Anesthesia Physical Anesthesia Plan  ASA: II  Anesthesia Plan: General   Post-op Pain Management:    Induction: Intravenous  PONV Risk Score and Plan: 3 and TIVA, Midazolam, Propofol infusion, Ondansetron and Treatment may vary due to age or medical condition  Airway Management Planned: Nasal Cannula and Simple Face Mask  Additional Equipment:   Intra-op Plan:   Post-operative Plan:   Informed Consent: I have reviewed the patients History and Physical, chart, labs and discussed the procedure including the risks, benefits and alternatives for the proposed anesthesia with the patient or authorized representative who has indicated his/her understanding and acceptance.     Dental advisory given  Plan Discussed with: CRNA  Anesthesia Plan Comments: (Plan Full PPE use  Plan GA with GETA as needed d/w pt -WTP with same after Q&A)        Anesthesia Quick  Evaluation

## 2018-09-18 NOTE — Anesthesia Procedure Notes (Signed)
Procedure Name: General with mask airway Date/Time: 09/18/2018 8:35 AM Performed by: Andree Elk, Saxon Crosby A, CRNA Pre-anesthesia Checklist: Timeout performed, Patient being monitored, Suction available, Emergency Drugs available and Patient identified Oxygen Delivery Method: Non-rebreather mask

## 2018-09-18 NOTE — H&P (View-Only) (Signed)
Primary Care Physician:  Perlie Mayo, NP Primary Gastroenterologist:  Dr. Oneida Alar  Pre-Procedure History & Physical: HPI:  Deanna Hughes is a 28 y.o. female here for  RECTAL BLEEDING/pain.  Past Medical History:  Diagnosis Date  . Anxiety   . BV (bacterial vaginosis) 04/02/2015  . Chlamydia    treated 6/6  . Chronic back pain   . Chronic headaches    MIGRAINES  . Contraceptive management 01/25/2013  . Encounter for Nexplanon removal 02/27/2015  . Irregular bleeding 06/03/2013  . Miscarriage 03/27/2013  . Nexplanon insertion 04/11/2013   nexplanon inserted 04/11/13 remove 3/32/18  . Right sided abdominal pain   . Scoliosis   . Seasonal allergies 07/03/2012  . Sinus infection 08/27/2013  . Supervision of normal pregnancy 08/27/2015    [facility] Initiated Care at   7+5 weeks  FOB  Dating By  LMP  Pap  GC/CT Initial:                36+wks: Genetic Screen NT/IT:  CF screen  Anatomic [place]  Flu vaccine  Tdap Recommended ~ 28wks Glucose Screen  2 hr GBS  Feed Preference  Contraception  Circumcision  Childbirth Classes  Pediatrician    . Threatened abortion in early pregnancy 03/20/2013  . Vaginal discharge 10/03/2012  . Yeast infection 07/03/2012    Past Surgical History:  Procedure Laterality Date  . HERNIA REPAIR    . INGUINAL HERNIA REPAIR Right 04/19/2017   Procedure: HERNIA REPAIR INGUINAL ADULT;  Surgeon: Robert Bellow, MD;  Location: [facility] ORS;  Service: General;  Laterality: Right;  . WISDOM TOOTH EXTRACTION      Prior to Admission medications   Medication Sig Start Date End Date Taking? Authorizing Provider  acetaminophen (TYLENOL) 500 MG tablet Take 1,000 mg by mouth 3 (three) times daily as needed for moderate pain or headache.    Yes [provider]  amitriptyline (ELAVIL) 50 MG tablet Take 1 tablet (50 mg total) by mouth at bedtime. 07/12/18  Yes Perlie Mayo, NP  busPIRone (BUSPAR) 5 MG tablet Take 1 tablet (5 mg total) by mouth 2 (two) times daily.  06/19/18  Yes Perlie Mayo, NP  cyclobenzaprine (FLEXERIL) 5 MG tablet Take 1 tablet (5 mg total) by mouth 3 (three) times daily as needed for muscle spasms. 05/15/18  Yes Perlie Mayo, NP  fluticasone (FLONASE) 50 MCG/ACT nasal spray Place 2 sprays into both nostrils daily as needed for allergies. 06/19/18  Yes Perlie Mayo, NP  hydrocortisone (ANUSOL-HC) 25 MG suppository Place 1 suppository (25 mg total) rectally every 12 (twelve) hours. 06/20/18  Yes Annitta Needs, NP  megestrol (MEGACE) 40 MG tablet 3 tablets a day for 5 days, 2 tablets a day for 5 days then 1 tablet daily 11/03/17  Yes Florian Buff, MD  metroNIDAZOLE (FLAGYL) 500 MG tablet Take 1 tablet (500 mg total) by mouth 2 (two) times daily. 08/28/18  Yes Roma Schanz, CNM  Multiple Vitamin (MULTIVITAMIN) tablet Take 1 tablet by mouth daily.   Yes [provider]  rizatriptan (MAXALT) 5 MG tablet Take 1 tablet (5 mg total) by mouth as needed for migraine. May repeat in 2 hours if needed, do not go over 30 mg in 24 hours 07/12/18  Yes Perlie Mayo, NP  UNABLE TO FIND Place 1 suppository vaginally 3 (three) times a week. Med Name: Boric Acid Vaginal Suppository 06/20/18  Yes Perlie Mayo, NP  etonogestrel [facility])  68 MG IMPL implant 1 each once by Subdermal route.    [provider]    Allergies as of 06/20/2018 - Review Complete 06/20/2018  Allergen Reaction Noted  . Aspirin Shortness Of Breath and Other (See Comments) 04/17/2017  . Advil [ibuprofen] Other (See Comments) 12/05/2016  . Latex Rash 06/20/2018    Family History  Problem Relation Age of Onset  . Asthma Mother   . Asthma Sister   . Asthma Brother   . Asthma Daughter   . Diabetes Maternal Grandmother   . Hypertension Maternal Grandmother   . Asthma Maternal Grandmother   . Heart disease Maternal Grandmother   . Stroke Maternal Grandmother   . Hypertension Maternal Grandfather   . Asthma Maternal Grandfather   . Heart disease  Maternal Grandfather   . Asthma Son   . Colon cancer Neg Hx   . Colon polyps Neg Hx     Social History   Socioeconomic History  . Marital status: Single    Spouse name: Not on file  . Number of children: Not on file  . Years of education: Not on file  . Highest education level: Not on file  Occupational History    Comment: [employer], cafeteria  Social Needs  . Financial resource strain: Not hard at all  . Food insecurity    Worry: Never true    Inability: Never true  . Transportation needs    Medical: No    Non-medical: No  Tobacco Use  . Smoking status: Never Smoker  . Smokeless tobacco: Never Used  Substance and Sexual Activity  . Alcohol use: No  . Drug use: No  . Sexual activity: Yes    Birth control/protection: Implant  Lifestyle  . Physical activity    Days per week: 0 days    Minutes per session: 0 min  . Stress: To some extent  Relationships  . Social connections    Talks on phone: More than three times a week    Gets together: More than three times a week    Attends religious service: Never    Active member of club or organization: No    Attends meetings of clubs or organizations: Never    Relationship status: Never married  . Intimate partner violence    Fear of current or ex partner: No    Emotionally abused: No    Physically abused: No    Forced sexual activity: No  Other Topics Concern  . Not on file  Social History Narrative   Lives with her 3 children      Zimiah (8)    Zatavious (6)      Zayla (1)- Daddy is present in life.        Review of Systems: See HPI, otherwise negative ROS   Physical Exam: BP 103/61   Pulse 88   Resp 14   SpO2 100%  General:   Alert,  pleasant and cooperative in NAD Head:  Normocephalic and atraumatic. Neck:  Supple; Lungs:  Clear throughout to auscultation.    Heart:  Regular rate and rhythm. Abdomen:  Soft, nontender and nondistended. Normal bowel sounds, without guarding, and without rebound.    Neurologic:  Alert and  oriented x4;  grossly normal neurologically.  Impression/Plan:     RECTAL BLEEDING  PLAN:   1. TCS/? Hemorrhoid banding TODAY. DISCUSSED PROCEDURE, BENEFITS, & RISKS: < 1% chance of medication reaction, bleeding, perforation, PELVIC VEIN SEPSIS, ASPIRATION, or rupture of spleen/liver requiring surgery to  fix it and missed polyps < 1 cm 10-20% of the time.

## 2018-09-18 NOTE — Transfer of Care (Signed)
Immediate Anesthesia Transfer of Care Note  Patient: Deanna Hughes  Procedure(s) Performed: COLONOSCOPY WITH PROPOFOL (N/A ) BIOPSY  Patient Location: PACU  Anesthesia Type:General  Level of Consciousness: awake, alert , oriented and patient cooperative  Airway & Oxygen Therapy: Patient Spontanous Breathing  Post-op Assessment: Report given to RN and Post -op Vital signs reviewed and stable  Post vital signs: Reviewed and stable  Last Vitals:  Vitals Value Taken Time  BP 93/57 09/18/18 0908  Temp    Pulse 80 09/18/18 0910  Resp 21 09/18/18 0910  SpO2 100 % 09/18/18 0910  Vitals shown include unvalidated device data.  Last Pain:  Vitals:   09/18/18 0727  TempSrc: Oral  PainSc: 6          Complications: No apparent anesthesia complications

## 2018-09-18 NOTE — H&P (Signed)
Primary Care Physician:  Perlie Mayo, NP Primary Gastroenterologist:  Dr. Oneida Alar  Pre-Procedure History & Physical: HPI:  Deanna Hughes is a 28 y.o. female here for  RECTAL BLEEDING/pain.  Past Medical History:  Diagnosis Date  . Anxiety   . BV (bacterial vaginosis) 04/02/2015  . Chlamydia    treated 6/6  . Chronic back pain   . Chronic headaches    MIGRAINES  . Contraceptive management 01/25/2013  . Encounter for Nexplanon removal 02/27/2015  . Irregular bleeding 06/03/2013  . Miscarriage 03/27/2013  . Nexplanon insertion 04/11/2013   nexplanon inserted 04/11/13 remove 3/32/18  . Right sided abdominal pain   . Scoliosis   . Seasonal allergies 07/03/2012  . Sinus infection 08/27/2013  . Supervision of normal pregnancy 08/27/2015    [facility] Care at   7+5 weeks  FOB  Dating By  LMP  Pap  GC/CT Initial:                36+wks: Genetic Screen NT/IT:  CF screen  Anatomic [place]  Flu vaccine  Tdap Recommended ~ 28wks Glucose Screen  2 hr GBS  Feed Preference  Contraception  Circumcision  Childbirth Classes  Pediatrician    . Threatened abortion in early pregnancy 03/20/2013  . Vaginal discharge 10/03/2012  . Yeast infection 07/03/2012    Past Surgical History:  Procedure Laterality Date  . HERNIA REPAIR    . INGUINAL HERNIA REPAIR Right 04/19/2017   Procedure: HERNIA REPAIR INGUINAL ADULT;  Surgeon: Robert Bellow, MD;  Location: [facility] ORS;  Service: General;  Laterality: Right;  . WISDOM TOOTH EXTRACTION      Prior to Admission medications   Medication Sig Start Date End Date Taking? Authorizing Provider  acetaminophen (TYLENOL) 500 MG tablet Take 1,000 mg by mouth 3 (three) times daily as needed for moderate pain or headache.    Yes [provider]  amitriptyline (ELAVIL) 50 MG tablet Take 1 tablet (50 mg total) by mouth at bedtime. 07/12/18  Yes Perlie Mayo, NP  busPIRone (BUSPAR) 5 MG tablet Take 1 tablet (5 mg total) by mouth 2 (two) times daily.  06/19/18  Yes Perlie Mayo, NP  cyclobenzaprine (FLEXERIL) 5 MG tablet Take 1 tablet (5 mg total) by mouth 3 (three) times daily as needed for muscle spasms. 05/15/18  Yes Perlie Mayo, NP  fluticasone (FLONASE) 50 MCG/ACT nasal spray Place 2 sprays into both nostrils daily as needed for allergies. 06/19/18  Yes Perlie Mayo, NP  hydrocortisone (ANUSOL-HC) 25 MG suppository Place 1 suppository (25 mg total) rectally every 12 (twelve) hours. 06/20/18  Yes Annitta Needs, NP  megestrol (MEGACE) 40 MG tablet 3 tablets a day for 5 days, 2 tablets a day for 5 days then 1 tablet daily 11/03/17  Yes Florian Buff, MD  metroNIDAZOLE (FLAGYL) 500 MG tablet Take 1 tablet (500 mg total) by mouth 2 (two) times daily. 08/28/18  Yes Roma Schanz, CNM  Multiple Vitamin (MULTIVITAMIN) tablet Take 1 tablet by mouth daily.   Yes [provider]  rizatriptan (MAXALT) 5 MG tablet Take 1 tablet (5 mg total) by mouth as needed for migraine. May repeat in 2 hours if needed, do not go over 30 mg in 24 hours 07/12/18  Yes Perlie Mayo, NP  UNABLE TO FIND Place 1 suppository vaginally 3 (three) times a week. Med Name: Boric Acid Vaginal Suppository 06/20/18  Yes Perlie Mayo, NP  etonogestrel [facility])  68 MG IMPL implant 1 each once by Subdermal route.    [provider]    Allergies as of 06/20/2018 - Review Complete 06/20/2018  Allergen Reaction Noted  . Aspirin Shortness Of Breath and Other (See Comments) 04/17/2017  . Advil [ibuprofen] Other (See Comments) 12/05/2016  . Latex Rash 06/20/2018    Family History  Problem Relation Age of Onset  . Asthma Mother   . Asthma Sister   . Asthma Brother   . Asthma Daughter   . Diabetes Maternal Grandmother   . Hypertension Maternal Grandmother   . Asthma Maternal Grandmother   . Heart disease Maternal Grandmother   . Stroke Maternal Grandmother   . Hypertension Maternal Grandfather   . Asthma Maternal Grandfather   . Heart disease  Maternal Grandfather   . Asthma Son   . Colon cancer Neg Hx   . Colon polyps Neg Hx     Social History   Socioeconomic History  . Marital status: Single    Spouse name: Not on file  . Number of children: Not on file  . Years of education: Not on file  . Highest education level: Not on file  Occupational History    Comment: [employer], cafeteria  Social Needs  . Financial resource strain: Not hard at all  . Food insecurity    Worry: Never true    Inability: Never true  . Transportation needs    Medical: No    Non-medical: No  Tobacco Use  . Smoking status: Never Smoker  . Smokeless tobacco: Never Used  Substance and Sexual Activity  . Alcohol use: No  . Drug use: No  . Sexual activity: Yes    Birth control/protection: Implant  Lifestyle  . Physical activity    Days per week: 0 days    Minutes per session: 0 min  . Stress: To some extent  Relationships  . Social connections    Talks on phone: More than three times a week    Gets together: More than three times a week    Attends religious service: Never    Active member of club or organization: No    Attends meetings of clubs or organizations: Never    Relationship status: Never married  . Intimate partner violence    Fear of current or ex partner: No    Emotionally abused: No    Physically abused: No    Forced sexual activity: No  Other Topics Concern  . Not on file  Social History Narrative   Lives with her 3 children      Zimiah (8)    Zatavious (6)      Zayla (1)- Daddy is present in life.        Review of Systems: See HPI, otherwise negative ROS   Physical Exam: BP 103/61   Pulse 88   Resp 14   SpO2 100%  General:   Alert,  pleasant and cooperative in NAD Head:  Normocephalic and atraumatic. Neck:  Supple; Lungs:  Clear throughout to auscultation.    Heart:  Regular rate and rhythm. Abdomen:  Soft, nontender and nondistended. Normal bowel sounds, without guarding, and without rebound.    Neurologic:  Alert and  oriented x4;  grossly normal neurologically.  Impression/Plan:     RECTAL BLEEDING  PLAN:   1. TCS/? Hemorrhoid banding TODAY. DISCUSSED PROCEDURE, BENEFITS, & RISKS: < 1% chance of medication reaction, bleeding, perforation, PELVIC VEIN SEPSIS, ASPIRATION, or rupture of spleen/liver requiring surgery to  fix it and missed polyps < 1 cm 10-20% of the time.

## 2018-09-18 NOTE — Op Note (Signed)
[facility] Patient Name: Deanna Hughes Procedure Date: 09/18/2018 8:21 AM MRN: [id number] Date of Birth: 11/22/1990 Attending MD: Barney Drain MD, MD CSN: [id number] Age: 28 Admit Type: Outpatient Procedure:                Colonoscopy WITH COLD FORCEPS BIOPSY Indications:              Hematochezia, Rectal pain Providers:                Barney Drain MD, MD, Janeece Riggers, RN, Randa Spike, Technician Referring MD:             Cherly Beach, NP Medicines:                Propofol per Anesthesia Complications:            No immediate complications. Estimated Blood Loss:     Estimated blood loss was minimal. Procedure:                Pre-Anesthesia Assessment:                           - Prior to the procedure, a History and Physical                            was performed, and patient medications and                            allergies were reviewed. The patient's tolerance of                            previous anesthesia was also reviewed. The risks                            and benefits of the procedure and the sedation                            options and risks were discussed with the patient.                            All questions were answered, and informed consent                            was obtained. Prior Anticoagulants: The patient has                            taken no previous anticoagulant or antiplatelet                            agents. ASA Grade Assessment: II - A patient with                            mild systemic disease. After reviewing the risks  and benefits, the patient was deemed in                            satisfactory condition to undergo the procedure.                            After obtaining informed consent, the colonoscope                            was passed under direct vision. Throughout the                            procedure, the patient's blood pressure, pulse, and                  oxygen saturations were monitored continuously. The                            [id number]-[id number]) scope was introduced through                            the anus and advanced to the 10 cm into the ileum.                            The colonoscopy was somewhat difficult due to a                            tortuous colon. Successful completion of the                            procedure was aided by straightening and shortening                            the scope to obtain bowel loop reduction and                            COLOWRAP. The patient tolerated the procedure well.                            The quality of the bowel preparation was excellent.                            The terminal ileum, ileocecal valve, appendiceal                            orifice, and rectum were photographed. Scope In: 8:44:23 AM Scope Out: 9:02:59 AM Scope Withdrawal Time: 0 hours 14 minutes 33 seconds  Total Procedure Duration: 0 hours 18 minutes 36 seconds  Findings:      The terminal ileum appeared normal. Biopsies were taken with a cold       forceps for histology.      The hepatic flexure, ascending colon and cecum appeared normal. Biopsies       were taken with a cold forceps for histology.      A patchy area of moderately erythematous,  inflamed and       vascular-pattern-decreased mucosa was found in the recto-sigmoid colon       and in the sigmoid colon. Biopsies were taken with a cold forceps for       histology.      Patchy mild inflammation characterized by congestion (edema) and       erythema was found in the rectum. Biopsies were taken with a cold       forceps for histology.      The recto-sigmoid colon, sigmoid colon and descending colon were       moderately tortuous.      External and internal hemorrhoids were found. The hemorrhoids were       moderate. Impression:               - PROCTOSIGMOIDITIS-ETIOLOGY UNCLEAR (ISCHEMIC,                            IBD, NSAIDs)                            - Tortuous LEFT colon.                           - RECTAL BLEEDING PAIN DUE TO External and internal                            hemorrhoids. Moderate Sedation:      Per Anesthesia Care Recommendation:           - Patient has a contact number available for                            emergencies. The signs and symptoms of potential                            delayed complications were discussed with the                            patient. Return to normal activities tomorrow.                            Written discharge instructions were provided to the                            patient.                           - Resume previous diet.                           - Continue present medications.                           - Await pathology results.                           - Repeat colonoscopy date to be determined after  pending pathology results are reviewed for                            surveillance based on pathology results.                           - Return to GI office in 3 months. Procedure Code(s):        --- Professional ---                           [phone], Colonoscopy, flexible; with biopsy, single                            or multiple Diagnosis Code(s):        --- Professional ---                           K64.8, Other hemorrhoids                           K52.9, Noninfective gastroenteritis and colitis,                            unspecified                           K63.89, Other specified diseases of intestine                           K62.89, Other specified diseases of anus and rectum                           K92.1, Melena (includes Hematochezia)                           Q43.8, Other specified congenital malformations of                            intestine CPT copyright 2019 American Medical Association. All rights reserved. The codes documented in this report are preliminary and upon coder review may  be revised  to meet current compliance requirements. Barney Drain, MD Barney Drain MD, MD 09/18/2018 9:17:08 AM This report has been signed electronically. Number of Addenda: 0

## 2018-09-18 NOTE — Anesthesia Postprocedure Evaluation (Signed)
Anesthesia Post Note  Patient: Deanna Hughes  Procedure(s) Performed: COLONOSCOPY WITH PROPOFOL (N/A ) BIOPSY  Patient location during evaluation: PACU Anesthesia Type: General Level of consciousness: awake and alert and oriented Pain management: pain level controlled Vital Signs Assessment: post-procedure vital signs reviewed and stable Respiratory status: spontaneous breathing Cardiovascular status: stable Postop Assessment: no apparent nausea or vomiting Anesthetic complications: no     Last Vitals:  Vitals:   09/18/18 0727  BP: 103/61  Pulse: 88  Resp: 14  SpO2: 100%    Last Pain:  Vitals:   09/18/18 0727  TempSrc: Oral  PainSc: 6                  Akshat Minehart A

## 2018-09-20 ENCOUNTER — Encounter: Payer: Self-pay | Admitting: Advanced Practice Midwife

## 2018-09-20 ENCOUNTER — Other Ambulatory Visit: Payer: Self-pay

## 2018-09-20 ENCOUNTER — Ambulatory Visit (INDEPENDENT_AMBULATORY_CARE_PROVIDER_SITE_OTHER): Payer: 59 | Admitting: Advanced Practice Midwife

## 2018-09-20 VITALS — BP 104/65 | HR 87 | Ht 62.0 in | Wt 181.0 lb

## 2018-09-20 DIAGNOSIS — Z3202 Encounter for pregnancy test, result negative: Secondary | ICD-10-CM

## 2018-09-20 DIAGNOSIS — Z3046 Encounter for surveillance of implantable subdermal contraceptive: Secondary | ICD-10-CM | POA: Diagnosis not present

## 2018-09-20 MED ORDER — NORETHIN-ETH ESTRAD-FE BIPHAS 1 MG-10 MCG / 10 MCG PO TABS: 1.0000 | ORAL_TABLET | Freq: Every day | ORAL | 11 refills | Status: DC

## 2018-09-20 NOTE — Patient Instructions (Signed)
Probiotics for the vagina:  VAGINAL:    OTC products such as Luvena, use 2-3 times a week   ORAL:   UP4 ADULT Superior, compatible and safe strains DDS  -1 L.acidophilus (super strain) with B. Longum, B.Bifidum & B.Infantis Acid-resistant-survives stomach acid and Bile salts   Fortified with prebiotic Fructooligosaccharide to enhance growth and performance Potency: 15 Billion CFU/capsule Dosage: 1 capsule daily, best before a meal.  Amazon:  $21.90 for 60 caps   iF THERE IS STILL A PROBLEM ADD,   FLORAJEN ACIDOPHILUS High Potency Acidophilus Florajen high potency acidophilus is especially effective for restoring and maintaining a healthy, comfortable balance of vaginal flora. It is also beneficial for overall intestinal health and maintaining the immune system. Beaver cultures per capsule Available in 30 and 60 capsules   Amazon:  $19.99 for 60 caps

## 2018-09-20 NOTE — Progress Notes (Signed)
HPI:  Deanna Hughes 28 y.o. here for Nexplanon removal.  Her future plans for birth control are cocs. .  Past Medical History: Past Medical History:  Diagnosis Date  . Anxiety   . BV (bacterial vaginosis) 04/02/2015  . Chlamydia    treated 6/6  . Chronic back pain   . Chronic headaches    MIGRAINES  . Contraceptive management 01/25/2013  . Encounter for Nexplanon removal 02/27/2015  . Irregular bleeding 06/03/2013  . Miscarriage 03/27/2013  . Nexplanon insertion 04/11/2013   nexplanon inserted 04/11/13 remove 3/32/18  . Right sided abdominal pain   . Scoliosis   . Seasonal allergies 07/03/2012  . Sinus infection 08/27/2013  . Supervision of normal pregnancy 08/27/2015    [facility] Initiated Care at   7+5 weeks  FOB  Dating By  LMP  Pap  GC/CT Initial:                36+wks: Genetic Screen NT/IT:  CF screen  Anatomic [place]  Flu vaccine  Tdap Recommended ~ 28wks Glucose Screen  2 hr GBS  Feed Preference  Contraception  Circumcision  Childbirth Classes  Pediatrician    . Threatened abortion in early pregnancy 03/20/2013  . Vaginal discharge 10/03/2012  . Yeast infection 07/03/2012    Past Surgical History: Past Surgical History:  Procedure Laterality Date  . HERNIA REPAIR    . INGUINAL HERNIA REPAIR Right 04/19/2017   Procedure: HERNIA REPAIR INGUINAL ADULT;  Surgeon: Robert Bellow, MD;  Location: [facility] ORS;  Service: General;  Laterality: Right;  . WISDOM TOOTH EXTRACTION      Family History: Family History  Problem Relation Age of Onset  . Asthma Mother   . Asthma Sister   . Asthma Brother   . Asthma Daughter   . Diabetes Maternal Grandmother   . Hypertension Maternal Grandmother   . Asthma Maternal Grandmother   . Heart disease Maternal Grandmother   . Stroke Maternal Grandmother   . Hypertension Maternal Grandfather   . Asthma Maternal Grandfather   . Heart disease Maternal Grandfather   . Asthma Son   . Colon cancer Neg Hx   . Colon polyps Neg Hx     Social  History: Social History   Tobacco Use  . Smoking status: Never Smoker  . Smokeless tobacco: Never Used  Substance Use Topics  . Alcohol use: No  . Drug use: No    Allergies:  Allergies  Allergen Reactions  . Aspirin Shortness Of Breath and Other (See Comments)    Heart beats fast  . Advil [Ibuprofen] Other (See Comments)    Breakout, heart palpitations and trouble breathing. Cant take Advil but can take Ibuprofen  . Latex Rash    Meds: (Not in a hospital admission)     Patient given informed consent for removal of her Nexplanon, time out was performed.  Signed copy in the chart.  Appropriate time out taken. Implanon site identified.  Area prepped in usual sterile fashon. One cc of 1% lidocaine was used to anesthetize the area at the distal end of the implant. A small stab incision was made right beside the implant on the distal portion.  The Nexplanon rod was grasped using hemostats and removed without difficulty.  There was less than 3 cc blood loss. There were no complications.  A small amount of antibiotic ointment and steri-strips were applied over the small incision.  A pressure bandage was applied to reduce any bruising.  The patient  tolerated the procedure well and was given post procedure instructions.

## 2018-09-21 ENCOUNTER — Encounter (HOSPITAL_COMMUNITY): Payer: Self-pay | Admitting: Gastroenterology

## 2018-09-25 ENCOUNTER — Telehealth: Payer: Self-pay | Admitting: *Deleted

## 2018-09-25 ENCOUNTER — Telehealth: Payer: Self-pay | Admitting: Gastroenterology

## 2018-09-25 ENCOUNTER — Other Ambulatory Visit: Payer: Self-pay | Admitting: *Deleted

## 2018-09-25 DIAGNOSIS — K625 Hemorrhage of anus and rectum: Secondary | ICD-10-CM

## 2018-09-25 DIAGNOSIS — K648 Other hemorrhoids: Secondary | ICD-10-CM

## 2018-09-25 NOTE — Telephone Encounter (Addendum)
Per [facility] needs to be done sooner. Called patient offered 9/18 at 8:30am. Patient was able to do this date. Aware she will need to arrive at 7:30am. Aware COVID-19 testing scheduled for 9/17 at 8:00am. Aware she needs to quarantine at home after testing until procedure. Instructions sent to her via [facility].   PA pending via [facility] website. Case ID # [id number]

## 2018-09-25 NOTE — Telephone Encounter (Signed)
SEE RESULT NOTE 9/4. Called patient. She is scheduled for flex sig with IH banding with mod sedation 10/30 at 2:30pm. Patient aware will need COVID-19 testing again. Scheduled for 10/28 at 3:00pm. Instructions mailed. Orders entered.

## 2018-09-25 NOTE — Telephone Encounter (Signed)
3 boxes of Lo Loestrin samples given to pt. Lot # [id number] A exp 10/21. Pt to pick up at front desk. [place]

## 2018-09-25 NOTE — Progress Notes (Signed)
cc'd to pcp 

## 2018-09-25 NOTE — Telephone Encounter (Signed)
[phone] patient said slf wanted her to schedule her banding with the hospital and they told her to call here

## 2018-09-25 NOTE — Progress Notes (Signed)
PATIENT SCHEDULED FOR 9/18 AT 8:30AM

## 2018-09-25 NOTE — Progress Notes (Signed)
PUT PT ON A CANCELLATION LIST. SHE SHOULD BE DONE BEFORE OCT 30.

## 2018-09-26 NOTE — Telephone Encounter (Signed)
Per [facility] website. PA not required

## 2018-10-01 ENCOUNTER — Encounter: Payer: Self-pay | Admitting: Family Medicine

## 2018-10-02 ENCOUNTER — Ambulatory Visit (INDEPENDENT_AMBULATORY_CARE_PROVIDER_SITE_OTHER): Payer: 59 | Admitting: Family Medicine

## 2018-10-02 ENCOUNTER — Other Ambulatory Visit: Payer: Self-pay

## 2018-10-02 ENCOUNTER — Encounter: Payer: Self-pay | Admitting: Family Medicine

## 2018-10-02 VITALS — BP 102/76 | HR 88 | Temp 98.8°F | Resp 12 | Ht 62.0 in | Wt 181.0 lb

## 2018-10-02 DIAGNOSIS — F321 Major depressive disorder, single episode, moderate: Secondary | ICD-10-CM | POA: Diagnosis not present

## 2018-10-02 DIAGNOSIS — F419 Anxiety disorder, unspecified: Secondary | ICD-10-CM

## 2018-10-02 DIAGNOSIS — G43009 Migraine without aura, not intractable, without status migrainosus: Secondary | ICD-10-CM

## 2018-10-02 MED ORDER — FLUOXETINE HCL 10 MG PO TABS: 10.0000 mg | ORAL_TABLET | Freq: Every day | ORAL | 1 refills | Status: DC

## 2018-10-02 MED ORDER — HYDROXYZINE HCL 10 MG PO TABS: 10.0000 mg | ORAL_TABLET | Freq: Two times a day (BID) | ORAL | 1 refills | Status: DC | PRN

## 2018-10-02 NOTE — Progress Notes (Signed)
Subjective:     Patient ID: Deanna Hughes, female   DOB: [birth year], 28 y.o.   MRN: [id number]  Deanna Hughes presents for Anxiety and panic attacks  Deanna Hughes is a 28 year old female patient with a history of anxiety, chronic headaches, chronic back pain, irregular menstrual bleeding, hemorrhoids among others.  Reports today after having a panic attack at work yesterday that caused her to have a breakdown and cry.  Per her she reports that she had to go into the office with several coworkers to help get her to calm down.  She has no idea what triggered it.  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  She reports that she feels stressed all the time while she is there.  She is asked for multiple transfers but she feels that her current supervisor is putting a block on them per her her supervisor says that she is good at what she does and does not want her to leave this particular area.  She is willing to go on a medication today.  To help with this ongoing anxiety and depression feel that she is going through.  Additionally she would think about doing therapy as well.  She would just like to the new job.  She reports taking all of her medications as directed.  She reports that Maxalt works very well for her.  She does not feel like the Elavil is very helpful for her and would like to stop that medication at this time.   Today patient denies signs and symptoms of COVID 19 infection including fever, chills, cough, shortness of breath, and headache.  Past Medical, Surgical, Social History, Allergies, and Medications have been Reviewed.  Past Medical History:  Diagnosis Date  . Anxiety   . BV (bacterial vaginosis) 04/02/2015  . Chlamydia    treated 6/6  . Chronic back pain   . Chronic headaches    MIGRAINES  . Contraceptive management 01/25/2013  . Encounter for Nexplanon removal 02/27/2015  . Irregular bleeding 06/03/2013  .  Miscarriage 03/27/2013  . Nexplanon insertion 04/11/2013   nexplanon inserted 04/11/13 remove 3/32/18  . Right sided abdominal pain   . Scoliosis   . Seasonal allergies 07/03/2012  . Sinus infection 08/27/2013  . Supervision of normal pregnancy 08/27/2015    [facility]iated [facility] at   7+5 weeks  FOB  Dating By  LMP  Pap  GC/CT Initial:                36+wks: Genetic Screen NT/IT:  CF screen  Anatomic [place]  Flu vaccine  Tdap Recommended ~ 28wks Glucose Screen  2 hr GBS  Feed Preference  Contraception  Circumcision  Childbirth Classes  Pediatrician    . Threatened abortion in early pregnancy 03/20/2013  . Vaginal discharge 10/03/2012  . Yeast infection 07/03/2012   Past Surgical History:  Procedure Laterality Date  . BIOPSY  09/18/2018   Procedure: BIOPSY;  Surgeon: Deanna Binder, MD;  [facility];  Service: Endoscopy;;  . COLONOSCOPY WITH PROPOFOL N/A 09/18/2018   Procedure: COLONOSCOPY WITH PROPOFOL;  Surgeon: Deanna Binder, MD;  Location: [facility];  Service: Endoscopy;  Laterality: N/A;  8:30am  . HERNIA REPAIR    . INGUINAL HERNIA REPAIR Right 04/19/2017   Procedure: HERNIA REPAIR INGUINAL ADULT;  Surgeon: Deanna Bellow, MD;  Location: [facility] ORS;  Service: General;  Laterality: Right;  . WISDOM TOOTH EXTRACTION     Social History   Socioeconomic History  . Marital status: Single    Spouse name: Not on file  . Number of children: 3  . Years of education: Not on file  . Highest education level: Not on file  Occupational History    Comment: Deanna Hughes, cafeteria  Social Needs  . Financial resource strain: Not hard at all  . Food insecurity    Worry: Never true    Inability: Never true  . Transportation needs    Medical: No    Non-medical: No  Tobacco Use  . Smoking status: Never Smoker  . Smokeless tobacco: Never Used  Substance and Sexual Activity  . Alcohol use: No  . Drug use: No  . Sexual activity: Yes    Birth control/protection: Implant   Lifestyle  . Physical activity    Days per week: 0 days    Minutes per session: 0 min  . Stress: To some extent  Relationships  . Social connections    Talks on phone: More than three times a week    Gets together: More than three times a week    Attends religious service: Never    Active member of club or organization: No    Attends meetings of clubs or organizations: Never    Relationship status: Never married  . Intimate partner violence    Fear of current or ex partner: No    Emotionally abused: No    Physically abused: No    Forced sexual activity: No  Other Topics Concern  . Not on file  Social History Narrative   Lives with her 3 children      Deanna Hughes (8)    Deanna Hughes (6)      Deanna Hughes (1)- Daddy is present in life.        Outpatient Encounter Medications as of 10/02/2018  Medication Sig  . acetaminophen (TYLENOL) 500 MG tablet Take 1,000 mg by mouth 3 (three) times daily as needed for moderate pain or headache.   . cyclobenzaprine (FLEXERIL) 5 MG tablet Take 1 tablet (5 mg total) by mouth 3 (three) times daily as needed for muscle spasms.  Marland Kitchen etonogestrel (NEXPLANON) 68 MG IMPL implant 1 each once by Subdermal route.  . fluticasone (FLONASE) 50 MCG/ACT nasal spray Place 2 sprays into both nostrils daily as needed for allergies.  . hydrocortisone (ANUSOL-HC) 25 MG suppository Place 1 suppository (25 mg total) rectally every 12 (twelve) hours.  . megestrol (MEGACE) 40 MG tablet 3 tablets a day for 5 days, 2 tablets a day for 5 days then 1 tablet daily  . Multiple Vitamin (MULTIVITAMIN) tablet Take 1 tablet by mouth daily.  . Norethindrone-Ethinyl Estradiol-Fe Biphas (LO LOESTRIN FE) 1 MG-10 MCG / 10 MCG tablet Take 1 tablet by mouth daily.  . rizatriptan (MAXALT) 5 MG tablet Take 1 tablet (5 mg total) by mouth as needed for migraine. May repeat in 2 hours if needed, do not go over 30 mg in 24 hours  . UNABLE TO FIND Place 1 suppository vaginally 3 (three) times a week. Med  Name: Boric Acid Vaginal Suppository  . busPIRone (BUSPAR) 5 MG tablet Take 1 tablet (5 mg total) by mouth 2 (two) times daily. (Patient not taking: Reported on 10/02/2018)  . FLUoxetine (PROZAC) 10 MG tablet Take 1 tablet (10 mg total) by mouth daily.  . hydrOXYzine (ATARAX/VISTARIL) 10 MG tablet Take 1 tablet (10 mg total) by mouth  2 (two) times daily as needed for anxiety.  . [DISCONTINUED] amitriptyline (ELAVIL) 50 MG tablet Take 1 tablet (50 mg total) by mouth at bedtime. (Patient not taking: Reported on 10/02/2018)  . [DISCONTINUED] metroNIDAZOLE (FLAGYL) 500 MG tablet Take 1 tablet (500 mg total) by mouth 2 (two) times daily. (Patient not taking: Reported on 09/20/2018)   No facility-administered encounter medications on file as of 10/02/2018.    Allergies  Allergen Reactions  . Aspirin Shortness Of Breath and Other (See Comments)    Heart beats fast  . Advil [Ibuprofen] Other (See Comments)    Breakout, heart palpitations and trouble breathing. Cant take Advil but can take Ibuprofen  . Latex Rash    Review of Systems  Constitutional: Negative for chills and fever.  HENT: Negative.   Eyes: Negative.   Respiratory: Negative.  Negative for cough and shortness of breath.   Cardiovascular: Negative.   Gastrointestinal: Negative.   Endocrine: Negative.   Genitourinary: Negative.   Musculoskeletal: Negative.   Skin: Negative.   Allergic/Immunologic: Negative.   Neurological: Negative.   Hematological: Negative.   Psychiatric/Behavioral: The patient is nervous/anxious.   All other systems reviewed and are negative.      Objective:     BP 102/76   Pulse 88   Temp 98.8 F (37.1 C) (Oral)   Resp 12   Ht 5\' 2"  (1.575 m)   Wt 181 lb 0.6 oz (82.1 kg)   SpO2 99%   BMI 33.11 kg/m   Physical Exam Vitals signs and nursing note reviewed.  Constitutional:      Appearance: Normal appearance. She is well-developed and well-groomed. She is obese.  HENT:     Head: Normocephalic and  atraumatic.     Right Ear: External ear normal.     Left Ear: External ear normal.     Nose: Nose normal.     Mouth/Throat:     Mouth: Mucous membranes are moist.     Pharynx: Oropharynx is clear.  Eyes:     General:        Right eye: No discharge.        Left eye: No discharge.     Conjunctiva/sclera: Conjunctivae normal.  Neck:     Musculoskeletal: Normal range of motion and neck supple.  Cardiovascular:     Rate and Rhythm: Normal rate and regular rhythm.     Pulses: Normal pulses.     Heart sounds: Normal heart sounds.  Pulmonary:     Effort: Pulmonary effort is normal.     Breath sounds: Normal breath sounds.  Musculoskeletal: Normal range of motion.  Skin:    General: Skin is warm.  Neurological:     General: No focal deficit present.     Mental Status: She is alert and oriented to person, place, and time.  Psychiatric:        Attention and Perception: Attention normal.        Mood and Affect: Mood is anxious.        Speech: Speech normal.        Behavior: Behavior is hyperactive. Behavior is cooperative.        Thought Content: Thought content normal.        Cognition and Memory: Cognition normal.        Judgment: Judgment normal.    Depression screen [facility] 2/9 10/02/2018 07/12/2018 06/19/2018  Decreased Interest 0 0 0  Down, Depressed, Hopeless 1 0 0  PHQ - 2 Score 1 0 0  Altered sleeping 0 0 0  Tired, decreased energy 3 0 0  Change in appetite 2 0 0  Feeling bad or failure about yourself  2 0 0  Trouble concentrating 0 0 0  Moving slowly or fidgety/restless 0 0 0  Suicidal thoughts 0 0 0  PHQ-9 Score 8 0 0  Difficult doing work/chores Very difficult - -  Some recent data might be hidden    GAD 7 : Generalized Anxiety Score 10/02/2018  Nervous, Anxious, on Edge 2  Control/stop worrying 1  Worry too much - different things 1  Trouble relaxing 0  Restless 0  Easily annoyed or irritable 1  Afraid - awful might happen 0  Total GAD 7 Score 5  Anxiety  Difficulty Very difficult         Assessment and Plan        1. Depression, major, single episode, moderate ([place]) Having depression current episode is a moderate level.  She had a score of 0 on previous PHQ 9's.  Today she has a score of an 8.  And she reports that this is making life very difficult for her.  Along with a GAD score of 5 which is making life very difficult for her.  She is not currently been on anything at this time for ongoing treatment.  Will start on Prozac.  Educated on the side effects of Prozac.  Additionally told her that there could be other medications that might work for her benefit.  She is currently not taking anything for birth control as well.  Recently stopped the Nexplanon and never was able to afford the p.o. medication that she was started on.  Additionally reported that it possibly might be important if she considers a job switch secondary to the stress level that she is feeling.  And or looking into employee assistance to help with stress level and therapy.  Reviewed side effects, risks and benefits of medication.   Patient acknowledged agreement and understanding of the plan.    - FLUoxetine (PROZAC) 10 MG tablet; Take 1 tablet (10 mg total) by mouth daily.  Dispense: 30 tablet; Refill: 1  2. Anxiety GAD is elevated today.  She reported that she had a breakdown at work yesterday which resulted in her going into an office with her coworkers and crying.  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  As she has having a difficult time working there.  She is asked to be transferred to other areas and they have been declined. Reports that BuSpar was not helping her.  Will stop BuSpar and start Atarax.  Advised of the side effects of Atarax.  Additionally believe that she needs to be on something that is an everyday medication to help with management of anxiety and stress.  Starting her on Prozac.  Has been  advised of the side effects of the Prozac as well.  Reviewed side effects, risks and benefits of medication.   Patient acknowledged agreement and understanding of the plan.    - hydrOXYzine (ATARAX/VISTARIL) 10 MG tablet; Take 1 tablet (10 mg total) by mouth 2 (two) times daily as needed for anxiety.  Dispense: 30 tablet; Refill: 1 - FLUoxetine (PROZAC) 10 MG tablet; Take 1 tablet (10 mg total) by mouth daily.  Dispense: 30 tablet; Refill: 1  3. Migraine without aura and without status migrainosus, not intractable Elavil is not working. Will stop this. Continue Maxalt  as this does work for her when she needs. Will consider Topamax in future or other medication if needed.     Follow Up: 11/02/2018  Perlie Mayo, DNP, AGNP-BC [place], [address], [place] [address] Office Hours: Mon-Thurs 8 am-5 pm; Fri 8 am-12 pm Office Phone:  [phone]  Office Fax: [phone]

## 2018-10-02 NOTE — Patient Instructions (Addendum)
Thank you for coming into the office today. I appreciate the opportunity to provide you with the care for your health and wellness. Today we discussed: mood  Follow Up: as previously scheduled 11/02/2018  No labs today  Homework:  1) Stop Elavil and Buspar  2) Start Prozac *remember you might feel a little more anxiety and or trouble sleep while adjusting to Prozac.  3) Start Hydroxyzine *can make you sleep  4) Continue to walk daily  Happy Fall :)  Please continue to practice social distancing to keep you, your family, and our community safe.  If you must go out, please wear a Mask and practice good handwashing.  [place]al YOUR HANDS WELL AND FREQUENTLY. AVOID TOUCHING YOUR FACE, UNLESS YOUR HANDS ARE FRESHLY WASHED.  GET FRESH AIR DAILY. STAY HYDRATED WITH WATER.   It was a pleasure to see you and I look forward to continuing to work together on your health and well-being. Please do not hesitate to call the office if you need care or have questions about your care.  Have a wonderful day and week. With Gratitude, Cherly Beach, DNP, AGNP-BC

## 2018-10-02 NOTE — Patient Instructions (Signed)
Deanna Hughes  10/02/2018     @PREFPERIOPPHARMACY @   Your procedure is scheduled on  10/05/2018  Report to [facility] at  [place].M.  Call this number if you have problems the morning of surgery:  [phone]   Remember:  Follow the diet and prep instructions given to you by Dr Nona Dell office.                        Take these medicines the morning of surgery with A SIP OF WATER  Maxalt(if needed).    Do not wear jewelry, make-up or nail polish.  Do not wear lotions, powders, or perfumes. Please wear deodorant and brush your teeth.  Do not shave 48 hours prior to surgery.  Men may shave face and neck.  Do not bring valuables to the hospital.  [facility] is not responsible for any belongings or valuables.  Contacts, dentures or bridgework may not be worn into surgery.  Leave your suitcase in the car.  After surgery it may be brought to your room.  For patients admitted to the hospital, discharge time will be determined by your treatment team.  Patients discharged the day of surgery will not be allowed to drive home.   Name and phone number of your driver:   family Special instructions:  None  Please read over the following fact sheets that you were given. Anesthesia Post-op Instructions and Care and Recovery After Surgery       Flexible Sigmoidoscopy, Care After This sheet gives you information about how to care for yourself after your procedure. Your health care provider may also give you more specific instructions. If you have problems or questions, contact your health care provider. What can I expect after the procedure? After the procedure, it is common to have:  Abdominal cramping or pain.  Bloating.  A small amount of rectal bleeding if you had a biopsy. Follow these instructions at home:  Take over-the-counter and prescription medicines only as told by your health care provider.  Do not drive for 24 hours if you received a medicine to help you  relax (sedative).  Keep all follow-up visits as told by your health care provider. This is important. Contact a health care provider if:  You have abdominal pain or cramping that gets worse or is not helped with medicine.  You continue to have small amounts of rectal bleeding after 24 hours.  You have nausea or vomiting.  You feel weak or dizzy.  You have a fever. Get help right away if:  You pass large blood clots or see a large amount of blood in the toilet after having a bowel movement.  You have nausea or vomiting for more than 24 hours after the procedure. This information is not intended to replace advice given to you by your health care provider. Make sure you discuss any questions you have with your health care provider. Document Released: 01/08/2013 Document Revised: 08/27/2015 Document Reviewed: 04/04/2015 Elsevier Patient Education  2020 [place] After These instructions provide you with information about caring for yourself after your procedure. Your health care provider may also give you more specific instructions. Your treatment has been planned according to current medical practices, but problems sometimes occur. Call your health care provider if you have any problems or questions after your procedure. What can I expect after the procedure? After your procedure, you may:  Feel sleepy for several hours.  Feel clumsy and have poor balance for several hours.  Feel forgetful about what happened after the procedure.  Have poor judgment for several hours.  Feel nauseous or vomit.  Have a sore throat if you had a breathing tube during the procedure. Follow these instructions at home: For at least 24 hours after the procedure:      Have a responsible adult stay with you. It is important to have someone help care for you until you are awake and alert.  Rest as needed.  Do not: ? Participate in activities in which you could  fall or become injured. ? Drive. ? Use heavy machinery. ? Drink alcohol. ? Take sleeping pills or medicines that cause drowsiness. ? Make important decisions or sign legal documents. ? Take care of children on your own. Eating and drinking  Follow the diet that is recommended by your health care provider.  If you vomit, drink water, juice, or soup when you can drink without vomiting.  Make sure you have little or no nausea before eating solid foods. General instructions  Take over-the-counter and prescription medicines only as told by your health care provider.  If you have sleep apnea, surgery and certain medicines can increase your risk for breathing problems. Follow instructions from your health care provider about wearing your sleep device: ? Anytime you are sleeping, including during daytime naps. ? While taking prescription pain medicines, sleeping medicines, or medicines that make you drowsy.  If you smoke, do not smoke without supervision.  Keep all follow-up visits as told by your health care provider. This is important. Contact a health care provider if:  You keep feeling nauseous or you keep vomiting.  You feel light-headed.  You develop a rash.  You have a fever. Get help right away if:  You have trouble breathing. Summary  For several hours after your procedure, you may feel sleepy and have poor judgment.  Have a responsible adult stay with you for at least 24 hours or until you are awake and alert. This information is not intended to replace advice given to you by your health care provider. Make sure you discuss any questions you have with your health care provider. Document Released: 04/26/2015 Document Revised: 04/03/2017 Document Reviewed: 04/26/2015 Elsevier Patient Education  2020 [employer].

## 2018-10-03 ENCOUNTER — Encounter (HOSPITAL_COMMUNITY): Payer: Self-pay

## 2018-10-03 ENCOUNTER — Other Ambulatory Visit: Payer: Self-pay

## 2018-10-03 ENCOUNTER — Encounter (HOSPITAL_COMMUNITY)
Admission: RE | Admit: 2018-10-03 | Discharge: 2018-10-03 | Disposition: A | Discharge status: Home / Self Care | Payer: 59 | Source: Ambulatory Visit | Attending: Gastroenterology | Admitting: Gastroenterology

## 2018-10-03 DIAGNOSIS — Z01812 Encounter for preprocedural laboratory examination: Secondary | ICD-10-CM | POA: Insufficient documentation

## 2018-10-03 LAB — HCG, SERUM, QUALITATIVE: Preg, Serum: NEGATIVE

## 2018-10-04 ENCOUNTER — Other Ambulatory Visit (HOSPITAL_COMMUNITY)
Admission: RE | Admit: 2018-10-04 | Discharge: 2018-10-04 | Disposition: A | Discharge status: Home / Self Care | Payer: 59 | Source: Ambulatory Visit | Attending: Gastroenterology | Admitting: Gastroenterology

## 2018-10-04 DIAGNOSIS — Z20828 Contact with and (suspected) exposure to other viral communicable diseases: Secondary | ICD-10-CM | POA: Diagnosis not present

## 2018-10-04 DIAGNOSIS — Z01812 Encounter for preprocedural laboratory examination: Secondary | ICD-10-CM | POA: Insufficient documentation

## 2018-10-04 LAB — SARS CORONAVIRUS 2 (TAT 6-24 HRS): SARS Coronavirus 2: NEGATIVE

## 2018-10-05 ENCOUNTER — Other Ambulatory Visit: Payer: Self-pay

## 2018-10-05 ENCOUNTER — Ambulatory Visit (HOSPITAL_COMMUNITY)
Admission: RE | Admit: 2018-10-05 | Discharge: 2018-10-05 | Disposition: A | Discharge status: Home / Self Care | Payer: 59 | Attending: Gastroenterology | Admitting: Gastroenterology

## 2018-10-05 ENCOUNTER — Encounter (HOSPITAL_COMMUNITY)
Admission: RE | Disposition: A | Discharge status: Home / Self Care | Payer: Self-pay | Source: Home / Self Care | Attending: Gastroenterology

## 2018-10-05 ENCOUNTER — Encounter (HOSPITAL_COMMUNITY): Payer: Self-pay | Admitting: *Deleted

## 2018-10-05 DIAGNOSIS — F419 Anxiety disorder, unspecified: Secondary | ICD-10-CM | POA: Insufficient documentation

## 2018-10-05 DIAGNOSIS — K648 Other hemorrhoids: Secondary | ICD-10-CM | POA: Insufficient documentation

## 2018-10-05 DIAGNOSIS — K921 Melena: Secondary | ICD-10-CM | POA: Diagnosis not present

## 2018-10-05 DIAGNOSIS — Z79899 Other long term (current) drug therapy: Secondary | ICD-10-CM | POA: Diagnosis not present

## 2018-10-05 DIAGNOSIS — K644 Residual hemorrhoidal skin tags: Secondary | ICD-10-CM | POA: Diagnosis not present

## 2018-10-05 DIAGNOSIS — K625 Hemorrhage of anus and rectum: Secondary | ICD-10-CM

## 2018-10-05 HISTORY — PX: FLEXIBLE SIGMOIDOSCOPY: SHX5431

## 2018-10-05 HISTORY — PX: HEMORRHOID BANDING: SHX5850

## 2018-10-05 SURGERY — SIGMOIDOSCOPY, FLEXIBLE
Anesthesia: Moderate Sedation

## 2018-10-05 MED ORDER — MIDAZOLAM HCL 5 MG/5ML IJ SOLN
INTRAMUSCULAR | Status: AC
  Filled 2018-10-05: qty 10

## 2018-10-05 MED ORDER — MIDAZOLAM HCL 5 MG/5ML IJ SOLN
INTRAMUSCULAR | Status: DC | PRN
  Administered 2018-10-05 (×2): 2 mg via INTRAVENOUS
  Administered 2018-10-05: 1 mg via INTRAVENOUS

## 2018-10-05 MED ORDER — SODIUM CHLORIDE 0.9 % IV SOLN
INTRAVENOUS | Status: DC
  Administered 2018-10-05: 08:00:00 via INTRAVENOUS

## 2018-10-05 MED ORDER — MEPERIDINE HCL 100 MG/ML IJ SOLN
INTRAMUSCULAR | Status: AC
  Filled 2018-10-05: qty 2

## 2018-10-05 MED ORDER — STERILE WATER FOR IRRIGATION IR SOLN
Status: DC | PRN
  Administered 2018-10-05: 09:00:00 1.5 mL

## 2018-10-05 MED ORDER — MEPERIDINE HCL 100 MG/ML IJ SOLN
INTRAMUSCULAR | Status: DC | PRN
  Administered 2018-10-05 (×2): 25 mg via INTRAVENOUS

## 2018-10-05 NOTE — Discharge Instructions (Signed)
You have internal and external hemorrhoids and I placed 3 bands to treat your rectal bleeding.    CALL [phone] IF YOUR RECTAL BLEEDING OR RECTAL PAIN GETS WORSE OR IF YOU HAVE FEVER, OR PROBLEMS URINATING.   USE PREPARATION H cream UP 4 TIMES A DAY FOR 7 DAYS THEN AS NEEDED TO CONTROL RECTAL BLEEDING, ITCHING, PRESSURE, BURNING, OR PAIN.   Please call in 2 weeks if your rectal pain/bleeding is no improved.   PLEASE CALL WITH QUESTIONS OR CONCERNS.      ENDOSCOPY Care After Read the instructions outlined below and refer to this sheet in the next week. These discharge instructions provide you with general information on caring for yourself after you leave the hospital. While your treatment has been planned according to the most current medical practices available, unavoidable complications occasionally occur. If you have any problems or questions after discharge, call DR. Jamani Bearce, [phone].  ACTIVITY  You may resume your regular activity, but move at a slower pace for the next 24 hours.   Take frequent rest periods for the next 24 hours.   Walking will help get rid of the air and reduce the bloated feeling in your belly (abdomen).   No driving for 24 hours (because of the medicine (anesthesia) used during the test).   You may shower.   Do not sign any important legal documents or operate any machinery for 24 hours (because of the anesthesia used during the test).    NUTRITION  Drink plenty of fluids.   You may resume your normal diet as instructed by your doctor.   Begin with a light meal and progress to your normal diet. Heavy or fried foods are harder to digest and may make you feel sick to your stomach (nauseated).   Avoid alcoholic beverages for 24 hours or as instructed.    MEDICATIONS  You may resume your normal medications.   WHAT YOU CAN EXPECT TODAY  Some feelings of bloating in the abdomen.   Passage of more gas than usual.   Spotting of blood  in your stool or on the toilet paper  .  IF YOU HADBIOPSIES TAKEN  DURING THE SIGMOIDOSCOPY/UPPER ENDOSCOPY:  Eat a soft diet IF YOU HAVE NAUSEA, BLOATING, ABDOMINAL PAIN, OR VOMITING.    FINDING OUT THE RESULTS OF YOUR TEST Not all test results are available during your visit. DR. Oneida Alar WILL CALL YOU WITHIN 7 DAYS OF YOUR PROCEDUE WITH YOUR RESULTS. Do not assume everything is normal if you have not heard from DR. Arlind Klingerman IN ONE WEEK, CALL HER OFFICE AT [phone].  SEEK IMMEDIATE MEDICAL ATTENTION AND CALL THE OFFICE: [phone] IF:  You have more than a spotting of blood in your stool.   Your belly is swollen (abdominal distention).   You are nauseated or vomiting.   You have a temperature over 101F.   You have abdominal pain or discomfort that is severe or gets worse throughout the day.    HEMORRHOIDAL BANDING COMPLICATIONS:  COMMON: 1. MINOR PAIN  UNCOMMON: 1. ABSCESS 2. BAND FALLS OFF 3. PROLAPSE OF HEMORRHOIDS AND PAIN 4. ULCER BLEEDING  A. USUALLY SELF-LIMITED: MAY LAST 3-5 DAYS  B. MAY REQUIRE INTERVENTION: 1-2 WEEKS AFTER INTERACTIONS 5. NECROTIZING PELVIC SEPSIS  A. SYMPTOMS: FEVER, PAIN, DIFFICULTY URINATING

## 2018-10-05 NOTE — Op Note (Signed)
[facility] Patient Name: Deanna Hughes Procedure Date: 10/05/2018 8:39 AM MRN: [id number] Date of Birth: 1990/10/13 Attending MD: Barney Drain MD, MD CSN: [id number] Age: 28 Admit Type: Outpatient Procedure:                Flexible Sigmoidoscopy WITH HEMORRHOID BANDING Indications:              Hematochezia Providers:                Barney Drain MD, MD, Charlsie Quest. Theda Sers RN, RN,                            Nelma Rothman, Technician Referring MD:             Cherly Beach, MD Medicines:                Meperidine 50 mg IV, Midazolam 5 mg IV Complications:            No immediate complications. Estimated Blood Loss:     Estimated blood loss was minimal. Procedure:                Pre-Anesthesia Assessment:                           - Prior to the procedure, a History and Physical                            was performed, and patient medications and                            allergies were reviewed. The patient's tolerance of                            previous anesthesia was also reviewed. The risks                            and benefits of the procedure and the sedation                            options and risks were discussed with the patient.                            All questions were answered, and informed consent                            was obtained. Prior Anticoagulants: The patient has                            taken no previous anticoagulant or antiplatelet                            agents. ASA Grade Assessment: II - A patient with                            mild systemic disease. After reviewing the risks  and benefits, the patient was deemed in                            satisfactory condition to undergo the procedure.                            After obtaining informed consent, the scope was                            passed under direct vision. The [id number]                            scope was introduced through the anus and  advanced                            to the the sigmoid colon. The flexible                            sigmoidoscopy was somewhat difficult. Successful                            completion of the procedure was aided by increasing                            the dose of sedation medication. The quality of the                            bowel preparation was excellent. Scope In: 8:54:38 AM Scope Out: 9:04:51 AM Total Procedure Duration: 0 hours 10 minutes 13 seconds  Findings:      Skin tags were found on perianal exam.      Bleeding internal hemorrhoids were found during retroflexion. The       hemorrhoids were moderate. Three bands were successfully placed. A slow       ooze remained at the end of the procedure. Estimated blood loss was       minimal. Impression:               - Perianal skin tags found on perianal exam.                           - Bleeding internal hemorrhoids. Banded. Moderate Sedation:      Moderate (conscious) sedation was administered by the endoscopy nurse       and supervised by the endoscopist. The following parameters were       monitored: oxygen saturation, heart rate, blood pressure, and response       to care. Total physician intraservice time was 23 minutes. Recommendation:           - Patient has a contact number available for                            emergencies. The signs and symptoms of potential                            delayed complications were discussed with the  patient. Return to normal activities tomorrow.                            Written discharge instructions were provided to the                            patient.                           - High fiber diet.                           - Continue present medications.                           - Return to GI clinic in 6 months. Procedure Code(s):        --- Professional ---                           [phone], Sigmoidoscopy, flexible; with band                             ligation(s) (eg, hemorrhoids)                           99153, Moderate sedation; each additional 15                            minutes intraservice time                           G0500, Moderate sedation services provided by the                            same physician or other qualified health care                            professional performing a gastrointestinal                            endoscopic service that sedation supports,                            requiring the presence of an independent trained                            observer to assist in the monitoring of the                            patient's level of consciousness and physiological                            status; initial 15 minutes of intra-service time;                            patient age 17 years or older (additional time 32  be reported with [phone], as appropriate) Diagnosis Code(s):        --- Professional ---                           K64.4, Residual hemorrhoidal skin tags                           K64.8, Other hemorrhoids                           K92.1, Melena (includes Hematochezia) CPT copyright 2019 American Medical Association. All rights reserved. The codes documented in this report are preliminary and upon coder review may  be revised to meet current compliance requirements. Barney Drain, MD Barney Drain MD, MD 10/05/2018 9:37:40 AM This report has been signed electronically. Number of Addenda: 0

## 2018-10-05 NOTE — Interval H&P Note (Signed)
History and Physical Interval Note: pt PRESENTS FOR HEMORRHOID BANDING. DISCUSSED PROCEDURE, BENEFITS, & RISKS: < 1% chance of medication reaction, bleeding, perforation, PELVIC VEIN SEPSIS, or aspiration. No change in physical exam. Rectal pain: 7/10.  10/05/2018 8:38 AM  Deanna Hughes  has presented today for surgery, with the diagnosis of RB, hemorrhoids.  The various methods of treatment have been discussed with the patient and family. After consideration of risks, benefits and other options for treatment, the patient has consented to  Procedure(s) with comments: FLEXIBLE SIGMOIDOSCOPY (N/A) - 8:30 HEMORRHOID BANDING (N/A) as a surgical intervention.  The patient's history has been reviewed, patient examined, no change in status, stable for surgery.  I have reviewed the patient's chart and labs.  Questions were answered to the patient's satisfaction.     [employer]

## 2018-10-09 ENCOUNTER — Encounter: Payer: Self-pay | Admitting: Family Medicine

## 2018-10-09 ENCOUNTER — Telehealth: Payer: Self-pay

## 2018-10-09 NOTE — Telephone Encounter (Signed)
Pt called and said she had her procedure done on Friday, flex sig and hemorrhoid bandings. She has seen just a little blood on tissue since and once she had some green ( she said it looked like snot).  She has been having worse headaches since the procedure and also back pain. She just wanted to make sure that Dr. Oneida Alar did not feel that she had an infection.

## 2018-10-09 NOTE — Telephone Encounter (Signed)
Pt is aware of the info.

## 2018-10-09 NOTE — Telephone Encounter (Signed)
PLEASE CALL PT. She may SEE A SMALL AMOUNT OF BLEEDING FOR 3-7 DAYS. SHE MAY SEE MUCOUS(SNOT) PERIODICALLY FOR 7-10 DAYS. HER HAs AND BACK SPIN SHOULD NOT B DUE TO HER PROCEDURE. SHE SHOULD SEE HER PCP IF SHE HAS CONCERNS OR USE TYLENOL, OR A HEATING PAD FOR PAIN. SHE COULD ALSO DO UPPER BODY YOGA EXERCISE TO RELIEVE TENSION IN HER HEAD AND NECK WE CAN SEND HE A HANDOUT.

## 2018-10-10 ENCOUNTER — Other Ambulatory Visit: Payer: Self-pay

## 2018-10-10 ENCOUNTER — Ambulatory Visit (INDEPENDENT_AMBULATORY_CARE_PROVIDER_SITE_OTHER): Payer: 59 | Admitting: Family Medicine

## 2018-10-10 ENCOUNTER — Encounter: Payer: Self-pay | Admitting: Family Medicine

## 2018-10-10 VITALS — Ht 62.0 in | Wt 178.0 lb

## 2018-10-10 DIAGNOSIS — K648 Other hemorrhoids: Secondary | ICD-10-CM

## 2018-10-10 DIAGNOSIS — G43009 Migraine without aura, not intractable, without status migrainosus: Secondary | ICD-10-CM

## 2018-10-10 NOTE — Patient Instructions (Signed)
    I appreciate the opportunity to provide you with the care for your health and wellness. Today we discussed: Return of headache  Follow up: 11/02/2018  No labs or referrals today  Take Maxalt as directed. Refrain from using Tylenol unless otherwise following the amount as directed on the bottle.  This can be very dangerous for your liver.  Make sure that you are staying hydrated.  If you require caffeine daily make sure that you are consuming caffeine.  If you find that the Maxalt does not help you please make a phone call back to the office.  We will look at restarting you on the Elavil nightly for prophylaxis.  If that fails to work we will look at doing a neurology referral in the future.  Please continue to practice social distancing to keep you, your family, and our community safe.  If you must go out, please wear a Mask and practice good handwashing.  [place] YOUR HANDS WELL AND FREQUENTLY. AVOID TOUCHING YOUR FACE, UNLESS YOUR HANDS ARE FRESHLY WASHED.  GET FRESH AIR DAILY. STAY HYDRATED WITH WATER.   It was a pleasure to see you and I look forward to continuing to work together on your health and well-being. Please do not hesitate to call the office if you need care or have questions about your care.  Have a wonderful day and week. With Gratitude, Cherly Beach, DNP, AGNP-BC

## 2018-10-10 NOTE — Progress Notes (Signed)
Virtual Visit via Telephone Note   This visit type was conducted due to national recommendations for restrictions regarding the COVID-19 Pandemic (e.g. social distancing) in an effort to limit this patient's exposure and mitigate transmission in our community.  Due to her co-morbid illnesses, this patient is at least at moderate risk for complications without adequate follow up.  This format is felt to be most appropriate for this patient at this time.  The patient did not have access to video technology/had technical difficulties with video requiring transitioning to audio format only (telephone).  All issues noted in this document were discussed and addressed.  No physical exam could be performed with this format.    Evaluation Performed:  Follow-up visit  Date:  10/10/2018   ID:  Zela, Cantor [birth year], MRN [id number]  Patient Location: Home Provider Location: Office  Location of Patient: Home Location of Provider: Telehealth Consent was obtain for visit to be over via telehealth. I verified that I am speaking with the correct person using two identifiers.  PCP:  Perlie Mayo, NP   Chief Complaint:  Return of headaches  History of Present Illness:    Deanna Hughes is a 28 y.o. female with history of anxiety, chronic back pain, migraines, scoliosis just recently had hemorrhoid banding.  Returns today via a phone visit secondary to return of her headaches that started after she received anesthesia for a flex sigmoidoscopy for internal hemorrhoids.  She reports that after having the procedure she started having headaches daily.  Has been taking large amounts of Tylenol.  She has taken "4-6" Tylenol as of today.  Reports that she is probably taking more than she needs to take considering she is going over the limit but the bottle says.  She does have medication Maxalt but I had prescribed her that she had not had with her when she was at work so she did not have it to  take. Most recently she was stopped off of Elavil as she reported that it was not helping her with her migraines but now she is wondering if it really was.  The patient does not have symptoms concerning for COVID-19 infection (fever, chills, cough, or new shortness of breath).   Past Medical, Surgical, Social History, Allergies, and Medications have been Reviewed.   Past Medical History:  Diagnosis Date  . Anxiety   . BV (bacterial vaginosis) 04/02/2015  . Chlamydia    treated 6/6  . Chronic back pain   . Chronic headaches    MIGRAINES  . Contraceptive management 01/25/2013  . Encounter for Nexplanon removal 02/27/2015  . Irregular bleeding 06/03/2013  . Miscarriage 03/27/2013  . Nexplanon insertion 04/11/2013   nexplanon inserted 04/11/13 remove 3/32/18  . Right sided abdominal pain   . Scoliosis   . Seasonal allergies 07/03/2012  . Sinus infection 08/27/2013  . Supervision of normal pregnancy 08/27/2015    [facility] Initiated Care at   7+5 weeks  FOB  Dating By  LMP  Pap  GC/CT Initial:                36+wks: Genetic Screen NT/IT:  CF screen  Anatomic [place]  Flu vaccine  Tdap Recommended ~ 28wks Glucose Screen  2 hr GBS  Feed Preference  Contraception  Circumcision  Childbirth Classes  Pediatrician    . Threatened abortion in early pregnancy 03/20/2013  . Vaginal discharge 10/03/2012  . Yeast infection 07/03/2012   Past  Surgical History:  Procedure Laterality Date  . BIOPSY  09/18/2018   Procedure: BIOPSY;  Surgeon: Danie Binder, MD;  [facility]: [facility];  Service: Endoscopy;;  . COLONOSCOPY WITH PROPOFOL N/A 09/18/2018   Procedure: COLONOSCOPY WITH PROPOFOL;  Surgeon: Danie Binder, MD;  Location: [facility];  Service: Endoscopy;  Laterality: N/A;  8:30am  . HERNIA REPAIR    . INGUINAL HERNIA REPAIR Right 04/19/2017   Procedure: HERNIA REPAIR INGUINAL ADULT;  Surgeon: Robert Bellow, MD;  Location: [facility] ORS;  Service: General;  Laterality: Right;  . WISDOM TOOTH  EXTRACTION       Current Meds  Medication Sig  . acetaminophen (TYLENOL) 500 MG tablet Take 1,000 mg by mouth 3 (three) times daily as needed for moderate pain or headache.   . cyclobenzaprine (FLEXERIL) 5 MG tablet Take 1 tablet (5 mg total) by mouth 3 (three) times daily as needed for muscle spasms.  Marland Kitchen etonogestrel (NEXPLANON) 68 MG IMPL implant 1 each once by Subdermal route.  Marland Kitchen FLUoxetine (PROZAC) 10 MG tablet Take 1 tablet (10 mg total) by mouth daily.  . fluticasone (FLONASE) 50 MCG/ACT nasal spray Place 2 sprays into both nostrils daily as needed for allergies.  . hydrocortisone (ANUSOL-HC) 25 MG suppository Place 1 suppository (25 mg total) rectally every 12 (twelve) hours.  . hydrOXYzine (ATARAX/VISTARIL) 10 MG tablet Take 1 tablet (10 mg total) by mouth 2 (two) times daily as needed for anxiety.  . megestrol (MEGACE) 40 MG tablet 3 tablets a day for 5 days, 2 tablets a day for 5 days then 1 tablet daily  . Multiple Vitamin (MULTIVITAMIN) tablet Take 1 tablet by mouth daily.  . Norethindrone-Ethinyl Estradiol-Fe Biphas (LO LOESTRIN FE) 1 MG-10 MCG / 10 MCG tablet Take 1 tablet by mouth daily.  . rizatriptan (MAXALT) 5 MG tablet Take 1 tablet (5 mg total) by mouth as needed for migraine. May repeat in 2 hours if needed, do not go over 30 mg in 24 hours  . UNABLE TO FIND Place 1 suppository vaginally 3 (three) times a week. Med Name: Boric Acid Vaginal Suppository     Allergies:   Aspirin, Advil [ibuprofen], and Latex   Social History   Tobacco Use  . Smoking status: Never Smoker  . Smokeless tobacco: Never Used  Substance Use Topics  . Alcohol use: No  . Drug use: No     Family Hx: The patient's family history includes Asthma in her brother, daughter, maternal grandfather, maternal grandmother, mother, sister, and son; Diabetes in her maternal grandmother; Heart disease in her maternal grandfather and maternal grandmother; Hypertension in her maternal grandfather and maternal  grandmother; Stroke in her maternal grandmother. There is no history of Colon cancer or Colon polyps.  ROS:   Please see the history of present illness.    All other systems reviewed and are negative.   Labs/Other Tests and Data Reviewed:     Recent Labs: 08/03/2018: ALT 11; BUN 10; Creat 0.63; Potassium 4.2; Sodium 137 09/14/2018: Hemoglobin 12.6; Platelets 201   Recent Lipid Panel No results found for: CHOL, TRIG, HDL, CHOLHDL, LDLCALC, LDLDIRECT  Wt Readings from Last 3 Encounters:  10/10/18 178 lb (80.7 kg)  10/05/18 181 lb 0.6 oz (82.1 kg)  10/02/18 181 lb 0.6 oz (82.1 kg)     Objective:    Vital Signs:  Ht 5\' 2"  (1.575 m)   Wt 178 lb (80.7 kg)   LMP 09/26/2018 (Approximate)   BMI 32.56 kg/m  GEN:  alert and oriented RESPIRATORY:  No shortness of breath noted in conversation PSYCH:  Normal mood, affect and communication  ASSESSMENT & PLAN:    1. Migraine without aura and without status migrainosus, not intractable Since she has not tried taking the Maxalt I have advised her to take the Maxalt and to refrain from using Tylenol at least for the next 24 hours.  I have read provided the directions on how to properly take the Maxalt.  If the Maxalt does not work for her we will consider restarting her on the Elavil or Topamax for migraine prevention.  Additionally she might require a referral to a neurologist as she has had continued on and off again headaches even with various treatments.   2. Internal hemorrhoids Resolved recently by Dr. Oneida Alar via flex sigmoidoscopy and banding.  Reports that she is doing well from this but that she developed leg discomfort and back pain.  Reports legs are better but that her back still hurts.  Does have history of back pain though.   Time:   Today, I have spent 10 minutes with the patient with telehealth technology discussing the above problems.     Medication Adjustments/Labs and Tests Ordered: Current medicines are reviewed at  length with the patient today.  Concerns regarding medicines are outlined above.   Tests Ordered: No orders of the defined types were placed in this encounter.   Medication Changes: No orders of the defined types were placed in this encounter.   Disposition:  Follow up   Signed, Perlie Mayo, NP  10/10/2018 2:54 PM     [facility]

## 2018-10-12 ENCOUNTER — Encounter (HOSPITAL_COMMUNITY): Payer: Self-pay | Admitting: Gastroenterology

## 2018-10-29 ENCOUNTER — Telehealth: Payer: Self-pay | Admitting: *Deleted

## 2018-10-29 MED ORDER — METRONIDAZOLE 500 MG PO TABS: 500.0000 mg | ORAL_TABLET | Freq: Two times a day (BID) | ORAL | 0 refills | Status: DC

## 2018-10-29 NOTE — Telephone Encounter (Signed)
Left message that rx for flagyl sent to [facility]

## 2018-10-29 NOTE — Telephone Encounter (Signed)
Pt is requesting a refill on Metronidazole 500 mg. Please advise. Thanks!! [place]

## 2018-11-02 ENCOUNTER — Other Ambulatory Visit: Payer: Self-pay

## 2018-11-02 ENCOUNTER — Ambulatory Visit: Payer: 59 | Admitting: Family Medicine

## 2018-11-06 ENCOUNTER — Ambulatory Visit (INDEPENDENT_AMBULATORY_CARE_PROVIDER_SITE_OTHER): Payer: 59 | Admitting: *Deleted

## 2018-11-06 ENCOUNTER — Other Ambulatory Visit: Payer: Self-pay

## 2018-11-06 ENCOUNTER — Other Ambulatory Visit: Payer: Self-pay | Admitting: Women's Health

## 2018-11-06 VITALS — BP 114/74 | HR 107 | Ht 62.0 in

## 2018-11-06 DIAGNOSIS — Z3201 Encounter for pregnancy test, result positive: Secondary | ICD-10-CM

## 2018-11-06 LAB — POCT URINE PREGNANCY: Preg Test, Ur: POSITIVE — AB

## 2018-11-06 MED ORDER — PRENATAL PLUS 27-1 MG PO TABS: 1.0000 | ORAL_TABLET | Freq: Every day | ORAL | 11 refills | Status: DC

## 2018-11-06 NOTE — Progress Notes (Addendum)
   NURSE VISIT- PREGNANCY CONFIRMATION   SUBJECTIVE:  Deanna Hughes is a 28 y.o. [phone] female at [redacted]w[redacted]d by certain LMP of Patient's last menstrual period was 09/23/2018 (exact date). Here for pregnancy confirmation.  Home pregnancy test: positive x 3  She reports no complaints.  She is not taking prenatal vitamins.    OBJECTIVE:  BP 114/74 (BP Location: Left Arm, Patient Position: Sitting, Cuff Size: Normal)   Pulse (!) 107   LMP 09/23/2018 (Exact Date)   Appears well, in no apparent distress OB History  Gravida Para Term Preterm AB Living  6 3 3  0 2 3  SAB TAB Ectopic Multiple Live Births  2 0 0 0 3    # Outcome Date GA Lbr Len/2nd Weight Sex Delivery Anes PTL Lv  6 Current           5 Term 04/10/16 [redacted]w[redacted]d 14:06 / 01:07 7 lb 14.1 oz (3.575 kg) F Vag-Spont EPI  LIV  4 Term 07/21/11 104w1d 16:15 / 01:05 9 lb 5 oz (4.224 kg) M Vag-Spont EPI  LIV     Birth Comments: No problems at birth  44 SAB              Birth Comments: System Generated. Please review and update pregnancy details.  2 SAB              Birth Comments: System Generated. Please review and update pregnancy details.  1 Term  [redacted]w[redacted]d 24:00 7 lb 10 oz (3.459 kg) F Vag-Spont EPI  LIV    Obstetric Comments  1st Menstrual Cycle:  15   1st Pregnancy:  18    Results for orders placed or performed in visit on 11/06/18 (from the past 24 hour(s))  POCT urine pregnancy   Collection Time: 11/06/18 11:43 AM  Result Value Ref Range   Preg Test, Ur Positive (A) Negative    ASSESSMENT: Positive Pregnancy Test, [redacted]w[redacted]d    PLAN: Schedule for dating ultrasound in 1-2 weeks Prenatal vitamins: note routed to [name] to send prescription   Nausea medicines: not currently needed   OB packet given: Yes  Rash, Celene Squibb  11/06/2018 11:43 AM   Chart reviewed for nurse visit. Agree with plan of care. Rx pnv sent.  Roma Schanz, [place] 11/06/2018 12:54 PM

## 2018-11-14 ENCOUNTER — Other Ambulatory Visit (HOSPITAL_COMMUNITY): Payer: 59

## 2018-11-16 ENCOUNTER — Other Ambulatory Visit: Payer: Self-pay | Admitting: Obstetrics & Gynecology

## 2018-11-16 DIAGNOSIS — O3680X0 Pregnancy with inconclusive fetal viability, not applicable or unspecified: Secondary | ICD-10-CM

## 2018-11-19 ENCOUNTER — Other Ambulatory Visit: Payer: Self-pay

## 2018-11-19 ENCOUNTER — Encounter: Payer: Self-pay | Admitting: Obstetrics & Gynecology

## 2018-11-19 ENCOUNTER — Ambulatory Visit (INDEPENDENT_AMBULATORY_CARE_PROVIDER_SITE_OTHER): Payer: 59

## 2018-11-19 DIAGNOSIS — Z3A01 Less than 8 weeks gestation of pregnancy: Secondary | ICD-10-CM | POA: Diagnosis not present

## 2018-11-19 DIAGNOSIS — O3680X0 Pregnancy with inconclusive fetal viability, not applicable or unspecified: Secondary | ICD-10-CM | POA: Diagnosis not present

## 2018-11-19 NOTE — Progress Notes (Signed)
[place] 6+5 wks single IUP w/ys,positive fht 122 bpm,normal ovaries bilat,subchorionic hemorrhage 2.2 x 1.5 x .8 cm

## 2018-11-26 ENCOUNTER — Encounter: Payer: Self-pay | Admitting: Gastroenterology

## 2018-12-17 ENCOUNTER — Telehealth: Payer: Self-pay | Admitting: *Deleted

## 2018-12-17 NOTE — Telephone Encounter (Signed)
Pt has a vaginal discharge with odor. She thinks it's yeast or BV. Pt is requesting an in person visit. Pt advised to call back tomorrow after 8:30 to schedule an appt. Pt voiced understanding. [place]

## 2018-12-21 ENCOUNTER — Other Ambulatory Visit (HOSPITAL_COMMUNITY)
Admission: RE | Admit: 2018-12-21 | Discharge: 2018-12-21 | Disposition: A | Discharge status: Home / Self Care | Payer: 59 | Source: Ambulatory Visit | Attending: Adult Health | Admitting: Adult Health

## 2018-12-21 ENCOUNTER — Encounter: Payer: Self-pay | Admitting: Adult Health

## 2018-12-21 ENCOUNTER — Ambulatory Visit (INDEPENDENT_AMBULATORY_CARE_PROVIDER_SITE_OTHER): Payer: 59 | Admitting: Adult Health

## 2018-12-21 ENCOUNTER — Other Ambulatory Visit: Payer: Self-pay

## 2018-12-21 VITALS — BP 109/68 | HR 96 | Ht 62.0 in | Wt 176.5 lb

## 2018-12-21 DIAGNOSIS — N898 Other specified noninflammatory disorders of vagina: Secondary | ICD-10-CM | POA: Diagnosis not present

## 2018-12-21 DIAGNOSIS — Z113 Encounter for screening for infections with a predominantly sexual mode of transmission: Secondary | ICD-10-CM | POA: Insufficient documentation

## 2018-12-21 DIAGNOSIS — Z3A11 11 weeks gestation of pregnancy: Secondary | ICD-10-CM | POA: Diagnosis not present

## 2018-12-21 DIAGNOSIS — B373 Candidiasis of vulva and vagina: Secondary | ICD-10-CM

## 2018-12-21 DIAGNOSIS — B3731 Acute candidiasis of vulva and vagina: Secondary | ICD-10-CM | POA: Insufficient documentation

## 2018-12-21 LAB — POCT WET PREP (WET MOUNT): Clue Cells Wet Prep Whiff POC: NEGATIVE

## 2018-12-21 MED ORDER — MONISTAT 3 4 % VA CREA: TOPICAL_CREAM | VAGINAL | 0 refills | Status: DC

## 2018-12-21 NOTE — Progress Notes (Signed)
  Subjective:     Patient ID: Deanna Hughes, female   DOB: 1990-05-24, 28 y.o.   MRN: [id number]  HPI Deanna Hughes is a 28 year old black female, single, who happens to be about 11+[redacted] weeks pregnant in complaining of vaginal discharge and itching, no odor. She is working in Arboriculturist at [employer].  PCP is Cherly Beach, NP.  Review of Systems + vaginal discharge and itching, no odor  Reviewed past medical,surgical, social and family history. Reviewed medications and allergies.     Objective:   Physical Exam BP 109/68 (BP Location: Left Arm, Patient Position: Sitting, Cuff Size: Normal)   Pulse 96   Ht 5\' 2"  (1.575 m)   Wt 176 lb 8 oz (80.1 kg)   LMP 09/23/2018 (Exact Date)   BMI 32.28 kg/m   Fall risk is low Skin warm and dry.Pelvic: external genitalia is normal in appearance no lesions, vagina: white discharge without odor,urethra has no lesions or masses noted, cervix:smooth and bulbous, uterus:about 12 week size, +FHM and FM on bedside US, non tender, no masses felt, adnexa: no masses or tenderness noted. Bladder is non tender and no masses felt. Wet prep: + yeast . CV swab obtained Examination chaperoned by Levy Pupa LPN    Assessment:     1. Vaginal discharge  2. Vaginal itching  3. Screening examination for STD (sexually transmitted disease) CV swab sent for GC/CHL and trich  4. Yeast infection of the vagina Use monistat once daily in vagina for 5-7 days  5. [redacted] weeks gestation of pregnancy Has new OB next week    Plan:     Return 12/14 has scheduled  Discuss with Manus Gunning about work

## 2018-12-25 LAB — CERVICOVAGINAL ANCILLARY ONLY
Chlamydia: NEGATIVE
Comment: NEGATIVE
Comment: NEGATIVE
Comment: NORMAL
Neisseria Gonorrhea: NEGATIVE
Trichomonas: NEGATIVE

## 2018-12-28 ENCOUNTER — Other Ambulatory Visit: Payer: Self-pay | Admitting: Obstetrics & Gynecology

## 2018-12-28 DIAGNOSIS — Z3682 Encounter for antenatal screening for nuchal translucency: Secondary | ICD-10-CM

## 2018-12-31 ENCOUNTER — Ambulatory Visit (INDEPENDENT_AMBULATORY_CARE_PROVIDER_SITE_OTHER): Payer: 59 | Admitting: Advanced Practice Midwife

## 2018-12-31 ENCOUNTER — Ambulatory Visit (INDEPENDENT_AMBULATORY_CARE_PROVIDER_SITE_OTHER): Payer: 59

## 2018-12-31 ENCOUNTER — Other Ambulatory Visit: Payer: Self-pay

## 2018-12-31 ENCOUNTER — Encounter: Payer: Self-pay | Admitting: Advanced Practice Midwife

## 2018-12-31 VITALS — BP 112/70 | HR 93 | Wt 173.0 lb

## 2018-12-31 DIAGNOSIS — Z3490 Encounter for supervision of normal pregnancy, unspecified, unspecified trimester: Secondary | ICD-10-CM | POA: Insufficient documentation

## 2018-12-31 DIAGNOSIS — Z3A12 12 weeks gestation of pregnancy: Secondary | ICD-10-CM | POA: Diagnosis not present

## 2018-12-31 DIAGNOSIS — Z113 Encounter for screening for infections with a predominantly sexual mode of transmission: Secondary | ICD-10-CM | POA: Diagnosis not present

## 2018-12-31 DIAGNOSIS — Z331 Pregnant state, incidental: Secondary | ICD-10-CM | POA: Diagnosis not present

## 2018-12-31 DIAGNOSIS — Z1379 Encounter for other screening for genetic and chromosomal anomalies: Secondary | ICD-10-CM | POA: Diagnosis not present

## 2018-12-31 DIAGNOSIS — Z3481 Encounter for supervision of other normal pregnancy, first trimester: Secondary | ICD-10-CM

## 2018-12-31 DIAGNOSIS — Z3682 Encounter for antenatal screening for nuchal translucency: Secondary | ICD-10-CM | POA: Diagnosis not present

## 2018-12-31 DIAGNOSIS — Z0283 Encounter for blood-alcohol and blood-drug test: Secondary | ICD-10-CM

## 2018-12-31 LAB — POCT URINALYSIS DIPSTICK OB
Blood, UA: NEGATIVE
Glucose, UA: NEGATIVE
Ketones, UA: NEGATIVE
Leukocytes, UA: NEGATIVE
Nitrite, UA: NEGATIVE
POC,PROTEIN,UA: NEGATIVE

## 2018-12-31 MED ORDER — ONDANSETRON 4 MG PO TBDP: 4.0000 mg | ORAL_TABLET | Freq: Four times a day (QID) | ORAL | 2 refills | Status: DC | PRN

## 2018-12-31 NOTE — Progress Notes (Signed)
[place] 12+5 wks,measurements c/w dates,crl 63.97 mm,fhr 161 bpm,anterior placenta,normal ovaries,NB present,NT 1.4 mm

## 2018-12-31 NOTE — Progress Notes (Signed)
INITIAL OBSTETRICAL VISIT Patient name: Deanna Hughes MRN [id number]  Date of birth: 09-22-1990 Chief Complaint:   Initial Prenatal Visit (nt/it)    Deanna Hughes is a 28 y.o. [phone] African American female at [redacted]w[redacted]d by LMP/early [place] with an Estimated Date of Delivery: 07/10/19 being seen today for her initial obstetrical visit.   Her obstetrical history is significant for term .SVD X3, 1 early SAB.  Today she reports nausea.  Patient's last menstrual period was 09/23/2018 (exact date). Last pap 2019. Results were: normal Review of Systems:   Pertinent items are noted in HPI Denies cramping/contractions, leakage of fluid, vaginal bleeding, abnormal vaginal discharge w/ itching/odor/irritation, headaches, visual changes, shortness of breath, chest pain, abdominal pain, severe nausea/vomiting, or problems with urination or bowel movements unless otherwise stated above.  Pertinent History Reviewed:  Reviewed past medical,surgical, social, obstetrical and family history.  Reviewed problem list, medications and allergies. OB History  Gravida Para Term Preterm AB Living  6 3 3  0 2 3  SAB TAB Ectopic Multiple Live Births  2 0 0 0 3    # Outcome Date GA Lbr Len/2nd Weight Sex Delivery Anes PTL Lv  6 Current           5 SAB 05/06/16             Birth Comments: System Generated. Please review and update pregnancy details.  4 Term 04/10/16 [redacted]w[redacted]d 14:06 / 01:07 7 lb 14.1 oz (3.575 kg) F Vag-Spont EPI  LIV  3 Term 07/21/11 [redacted]w[redacted]d 16:15 / 01:05 9 lb 5 oz (4.224 kg) M Vag-Spont EPI N LIV     Birth Comments: No problems at birth  2 SAB 2012             Birth Comments: System Generated. Please review and update pregnancy details.  1 Term 02/22/09 [redacted]w[redacted]d 24:00 7 lb 10 oz (3.459 kg) F Vag-Spont EPI N LIV    Obstetric Comments  1st Menstrual Cycle:  15   1st Pregnancy:  18   Physical Assessment:   Vitals:   12/31/18 1041  BP: 112/70  Pulse: 93  Weight: 173 lb (78.5 kg)  Body mass index is  31.64 kg/m.       Physical Examination:  General appearance - well appearing, and in no distress  Mental status - alert, oriented to person, place, and time  Psych:  She has a normal mood and affect  Skin - warm and dry, normal color, no suspicious lesions noted  Chest - effort normal, all lung fields clear to auscultation bilaterally  Heart - normal rate and regular rhythm  Abdomen - soft, nontender  Extremities:  No swelling or varicosities    via [place] 12+5 wks,measurements c/w dates,crl 63.97 mm,fhr 161 bpm,anterior placenta,normal ovaries,NB present,NT 1.4 mm  Results for orders placed or performed in visit on 12/31/18 (from the past 24 hour(s))  POC Urinalysis Dipstick OB   Collection Time: 12/31/18 11:03 AM  Result Value Ref Range   Color, UA     Clarity, UA     Glucose, UA Negative Negative   Bilirubin, UA     Ketones, UA neg    Spec Grav, UA     Blood, UA neg    pH, UA     POC,PROTEIN,UA Negative Negative, Trace, Small (1+), Moderate (2+), Large (3+), 4+   Urobilinogen, UA     Nitrite, UA neg    Leukocytes, UA Negative Negative   Appearance     Odor  Assessment & Plan:  1) Low-Risk Pregnancy [id number] at [redacted]w[redacted]d with an Estimated Date of Delivery: 07/10/19   2) Initial OB visit  3) Nausea:  rx zofran  4)  Works in dietary at [employer]:  They are not allowed to wear N95 masks in unknown Covid pt's rooms. Note given that she should either be allowed to wear proper PPE or not be involved in direct pt care.   Meds: No orders of the defined types were placed in this encounter.   Initial labs obtained Continue prenatal vitamins Reviewed n/v relief measures and warning s/s to report Rx zofran Reviewed recommended weight gain based on pre-gravid BMI Encouraged well-balanced diet Watched video for carrier screening/genetic testing:  Genetic Screening discussed First Screen and Integrated Screen: requested Cystic fibrosis screening declined SMA screening declined Fragile  X screening declined Ultrasound discussed; fetal survey: requested CCNC completed  Follow-up: Return in about 3 weeks (around 01/21/2019) for OB Mychart visit.   Orders Placed This Encounter  Procedures  . Urine Culture  . GC/Chlamydia Probe Amp  . Obstetric Panel, Including HIV  . Pain Management Screening Profile (10S)  . Integrated 1  . POC Urinalysis Dipstick OB    Christin Fudge DNP, CNM 12/31/2018 11:24 AM

## 2018-12-31 NOTE — Patient Instructions (Signed)
 First Trimester of Pregnancy The first trimester of pregnancy is from week 1 until the end of week 12 (months 1 through 3). A week after a sperm fertilizes an egg, the egg will implant on the wall of the uterus. This embryo will begin to develop into a baby. Genes from you and your partner are forming the baby. The female genes determine whether the baby is a boy or a girl. At 6-8 weeks, the eyes and face are formed, and the heartbeat can be seen on ultrasound. At the end of 12 weeks, all the baby's organs are formed.  Now that you are pregnant, you will want to do everything you can to have a healthy baby. Two of the most important things are to get good prenatal care and to follow your health care provider's instructions. Prenatal care is all the medical care you receive before the baby's birth. This care will help prevent, find, and treat any problems during the pregnancy and childbirth. BODY CHANGES Your body goes through many changes during pregnancy. The changes vary from woman to woman.   You may gain or lose a couple of pounds at first.  You may feel sick to your stomach (nauseous) and throw up (vomit). If the vomiting is uncontrollable, call your health care provider.  You may tire easily.  You may develop headaches that can be relieved by medicines approved by your health care provider.  You may urinate more often. Painful urination may mean you have a bladder infection.  You may develop heartburn as a result of your pregnancy.  You may develop constipation because certain hormones are causing the muscles that push waste through your intestines to slow down.  You may develop hemorrhoids or swollen, bulging veins (varicose veins).  Your breasts may begin to grow larger and become tender. Your nipples may stick out more, and the tissue that surrounds them (areola) may become darker.  Your gums may bleed and may be sensitive to brushing and flossing.  Dark spots or blotches  (chloasma, mask of pregnancy) may develop on your face. This will likely fade after the baby is born.  Your menstrual periods will stop.  You may have a loss of appetite.  You may develop cravings for certain kinds of food.  You may have changes in your emotions from day to day, such as being excited to be pregnant or being concerned that something may go wrong with the pregnancy and baby.  You may have more vivid and strange dreams.  You may have changes in your hair. These can include thickening of your hair, rapid growth, and changes in texture. Some women also have hair loss during or after pregnancy, or hair that feels dry or thin. Your hair will most likely return to normal after your baby is born. WHAT TO EXPECT AT YOUR PRENATAL VISITS During a routine prenatal visit:  You will be weighed to make sure you and the baby are growing normally.  Your blood pressure will be taken.  Your abdomen will be measured to track your baby's growth.  The fetal heartbeat will be listened to starting around week 10 or 12 of your pregnancy.  Test results from any previous visits will be discussed. Your health care provider may ask you:  How you are feeling.  If you are feeling the baby move.  If you have had any abnormal symptoms, such as leaking fluid, bleeding, severe headaches, or abdominal cramping.  If you have any questions. Other   tests that may be performed during your first trimester include:  Blood tests to find your blood type and to check for the presence of any previous infections. They will also be used to check for low iron levels (anemia) and Rh antibodies. Later in the pregnancy, blood tests for diabetes will be done along with other tests if problems develop.  Urine tests to check for infections, diabetes, or protein in the urine.  An ultrasound to confirm the proper growth and development of the baby.  An amniocentesis to check for possible genetic problems.  Fetal  screens for spina bifida and Down syndrome.  You may need other tests to make sure you and the baby are doing well. HOME CARE INSTRUCTIONS  Medicines  Follow your health care provider's instructions regarding medicine use. Specific medicines may be either safe or unsafe to take during pregnancy.  Take your prenatal vitamins as directed.  If you develop constipation, try taking a stool softener if your health care provider approves. Diet  Eat regular, well-balanced meals. Choose a variety of foods, such as meat or vegetable-based protein, fish, milk and low-fat dairy products, vegetables, fruits, and whole grain breads and cereals. Your health care provider will help you determine the amount of weight gain that is right for you.  Avoid raw meat and uncooked cheese. These carry germs that can cause birth defects in the baby.  Eating four or five small meals rather than three large meals a day may help relieve nausea and vomiting. If you start to feel nauseous, eating a few soda crackers can be helpful. Drinking liquids between meals instead of during meals also seems to help nausea and vomiting.  If you develop constipation, eat more high-fiber foods, such as fresh vegetables or fruit and whole grains. Drink enough fluids to keep your urine clear or pale yellow. Activity and Exercise  Exercise only as directed by your health care provider. Exercising will help you:  Control your weight.  Stay in shape.  Be prepared for labor and delivery.  Experiencing pain or cramping in the lower abdomen or low back is a good sign that you should stop exercising. Check with your health care provider before continuing normal exercises.  Try to avoid standing for long periods of time. Move your legs often if you must stand in one place for a long time.  Avoid heavy lifting.  Wear low-heeled shoes, and practice good posture.  You may continue to have sex unless your health care provider directs you  otherwise. Relief of Pain or Discomfort  Wear a good support bra for breast tenderness.   Take warm sitz baths to soothe any pain or discomfort caused by hemorrhoids. Use hemorrhoid cream if your health care provider approves.   Rest with your legs elevated if you have leg cramps or low back pain.  If you develop varicose veins in your legs, wear support hose. Elevate your feet for 15 minutes, 3-4 times a day. Limit salt in your diet. Prenatal Care  Schedule your prenatal visits by the twelfth week of pregnancy. They are usually scheduled monthly at first, then more often in the last 2 months before delivery.  Write down your questions. Take them to your prenatal visits.  Keep all your prenatal visits as directed by your health care provider. Safety  Wear your seat belt at all times when driving.  Make a list of emergency phone numbers, including numbers for family, friends, the hospital, and police and fire departments. General   Tips  Ask your health care provider for a referral to a local prenatal education class. Begin classes no later than at the beginning of month 6 of your pregnancy.  Ask for help if you have counseling or nutritional needs during pregnancy. Your health care provider can offer advice or refer you to specialists for help with various needs.  Do not use hot tubs, steam rooms, or saunas.  Do not douche or use tampons or scented sanitary pads.  Do not cross your legs for long periods of time.  Avoid cat litter boxes and soil used by cats. These carry germs that can cause birth defects in the baby and possibly loss of the fetus by miscarriage or stillbirth.  Avoid all smoking, herbs, alcohol, and medicines not prescribed by your health care provider. Chemicals in these affect the formation and growth of the baby.  Schedule a dentist appointment. At home, brush your teeth with a soft toothbrush and be gentle when you floss. SEEK MEDICAL CARE IF:   You have  dizziness.  You have mild pelvic cramps, pelvic pressure, or nagging pain in the abdominal area.  You have persistent nausea, vomiting, or diarrhea.  You have a bad smelling vaginal discharge.  You have pain with urination.  You notice increased swelling in your face, hands, legs, or ankles. SEEK IMMEDIATE MEDICAL CARE IF:   You have a fever.  You are leaking fluid from your vagina.  You have spotting or bleeding from your vagina.  You have severe abdominal cramping or pain.  You have rapid weight gain or loss.  You vomit blood or material that looks like coffee grounds.  You are exposed to German measles and have never had them.  You are exposed to fifth disease or chickenpox.  You develop a severe headache.  You have shortness of breath.  You have any kind of trauma, such as from a fall or a car accident. Document Released: 12/28/2000 Document Revised: 05/20/2013 Document Reviewed: 11/13/2012 Exit[employer] Patient Information 2015 [employer]. This information is not intended to replace advice given to you by your health care provider. Make sure you discuss any questions you have with your health care provider.   Nausea & Vomiting  Have saltine crackers or pretzels by your bed and eat a few bites before you raise your head out of bed in the morning  Eat small frequent meals throughout the day instead of large meals  Drink plenty of fluids throughout the day to stay hydrated, just don't drink a lot of fluids with your meals.  This can make your stomach fill up faster making you feel sick  Do not brush your teeth right after you eat  Products with real ginger are good for nausea, like ginger ale and ginger hard candy Make sure it says made with real ginger!  Sucking on sour candy like lemon heads is also good for nausea  If your prenatal vitamins make you nauseated, take them at night so you will sleep through the nausea  Sea Bands  If you feel like you need  medicine for the nausea & vomiting please let us know  If you are unable to keep any fluids or food down please let us know   Constipation  Drink plenty of fluid, preferably water, throughout the day  Eat foods high in fiber such as fruits, vegetables, and grains  Exercise, such as walking, is a good way to keep your bowels regular  Drink warm fluids, especially warm   prune juice, or decaf coffee  Eat a 1/2 cup of real oatmeal (not instant), 1/2 cup applesauce, and 1/2-1 cup warm prune juice every day  If needed, you may take Colace (docusate sodium) stool softener once or twice a day to help keep the stool soft. If you are pregnant, wait until you are out of your first trimester (12-14 weeks of pregnancy)  If you still are having problems with constipation, you may take Miralax once daily as needed to help keep your bowels regular.  If you are pregnant, wait until you are out of your first trimester (12-14 weeks of pregnancy)  Safe Medications in Pregnancy   Acne: Benzoyl Peroxide Salicylic Acid  Backache/Headache: Tylenol: 2 regular strength every 4 hours OR              2 Extra strength every 6 hours  Colds/Coughs/Allergies: Benadryl (alcohol free) 25 mg every 6 hours as needed Breath right strips Claritin Cepacol throat lozenges Chloraseptic throat spray Cold-Eeze- up to three times per day Cough drops, alcohol free Flonase (by prescription only) Guaifenesin Mucinex Robitussin DM (plain only, alcohol free) Saline nasal spray/drops Sudafed (pseudoephedrine) & Actifed ** use only after [redacted] weeks gestation and if you do not have high blood pressure Tylenol Vicks Vaporub Zinc lozenges Zyrtec   Constipation: Colace Ducolax suppositories Fleet enema Glycerin suppositories Metamucil Milk of magnesia Miralax Senokot Smooth move tea  Diarrhea: Kaopectate Imodium A-D  *NO pepto Bismol  Hemorrhoids: Anusol Anusol HC Preparation  H Tucks  Indigestion: Tums Maalox Mylanta Zantac  Pepcid  Insomnia: Benadryl (alcohol free) 25mg every 6 hours as needed Tylenol PM Unisom, no Gelcaps  Leg Cramps: Tums MagGel  Nausea/Vomiting:  Bonine Dramamine Emetrol Ginger extract Sea bands Meclizine  Nausea medication to take during pregnancy:  Unisom (doxylamine succinate 25 mg tablets) Take one tablet daily at bedtime. If symptoms are not adequately controlled, the dose can be increased to a maximum recommended dose of two tablets daily (1/2 tablet in the morning, 1/2 tablet mid-afternoon and one at bedtime). Vitamin B6 100mg tablets. Take one tablet twice a day (up to 200 mg per day).  Skin Rashes: Aveeno products Benadryl cream or 25mg every 6 hours as needed Calamine Lotion 1% cortisone cream  Yeast infection: Gyne-lotrimin 7 Monistat 7   **If taking multiple medications, please check labels to avoid duplicating the same active ingredients **take medication as directed on the label ** Do not exceed 4000 mg of tylenol in 24 hours **Do not take medications that contain aspirin or ibuprofen      

## 2019-01-01 LAB — OBSTETRIC PANEL, INCLUDING HIV
Antibody Screen: NEGATIVE
Basophils Absolute: 0 10*3/uL (ref 0.0–0.2)
Basos: 0 %
EOS (ABSOLUTE): 0 10*3/uL (ref 0.0–0.4)
Eos: 1 %
HIV Screen 4th Generation wRfx: NONREACTIVE
Hematocrit: 37.1 % (ref 34.0–46.6)
Hemoglobin: 12.6 g/dL (ref 11.1–15.9)
Hepatitis B Surface Ag: NEGATIVE
Immature Grans (Abs): 0 10*3/uL (ref 0.0–0.1)
Immature Granulocytes: 0 %
Lymphocytes Absolute: 1.7 10*3/uL (ref 0.7–3.1)
Lymphs: 24 %
MCH: 30.1 pg (ref 26.6–33.0)
MCHC: 34 g/dL (ref 31.5–35.7)
MCV: 89 fL (ref 79–97)
Monocytes Absolute: 0.5 10*3/uL (ref 0.1–0.9)
Monocytes: 7 %
Neutrophils Absolute: 4.7 10*3/uL (ref 1.4–7.0)
Neutrophils: 68 %
Platelets: 187 10*3/uL (ref 150–450)
RBC: 4.18 x10E6/uL (ref 3.77–5.28)
RDW: 12.7 % (ref 11.7–15.4)
RPR Ser Ql: NONREACTIVE
Rh Factor: POSITIVE
Rubella Antibodies, IGG: 6.41 index (ref >0.99)
WBC: 7 10*3/uL (ref 3.4–10.8)

## 2019-01-01 LAB — PMP SCREEN PROFILE (10S), URINE
Amphetamine Scrn, Ur: NEGATIVE ng/mL
BARBITURATE SCREEN URINE: NEGATIVE ng/mL
BENZODIAZEPINE SCREEN, URINE: NEGATIVE ng/mL
CANNABINOIDS UR QL SCN: NEGATIVE ng/mL
Cocaine (Metab) Scrn, Ur: NEGATIVE ng/mL
Creatinine(Crt), U: 155.8 mg/dL (ref 20.0–300.0)
Methadone Screen, Urine: NEGATIVE ng/mL
OXYCODONE+OXYMORPHONE UR QL SCN: NEGATIVE ng/mL
Opiate Scrn, Ur: NEGATIVE ng/mL
Ph of Urine: 8.2 (ref 4.5–8.9)
Phencyclidine Qn, Ur: NEGATIVE ng/mL
Propoxyphene Scrn, Ur: NEGATIVE ng/mL

## 2019-01-01 LAB — GC/CHLAMYDIA PROBE AMP
Chlamydia trachomatis, NAA: NEGATIVE
Neisseria Gonorrhoeae by PCR: NEGATIVE

## 2019-01-02 LAB — INTEGRATED 1
Crown Rump Length: 64 mm
Gest. Age on Collection Date: 12.6 weeks
Maternal Age at EDD: 26.8 yr
Nuchal Translucency (NT): 1.4 mm
Number of Fetuses: 1
PAPP-A Value: 1544.2 ng/mL
Weight: 173 [lb_av]

## 2019-01-02 LAB — URINE CULTURE

## 2019-01-14 ENCOUNTER — Telehealth: Payer: Self-pay | Admitting: Obstetrics & Gynecology

## 2019-01-14 ENCOUNTER — Encounter: Payer: Self-pay | Admitting: Family Medicine

## 2019-01-14 NOTE — Telephone Encounter (Signed)
Patient called, stated that she is having a discharge and odor.  I offered to bring her in for a self swab tomorrow.  She declined and wanted to speak to a nurse to see if there was any other options for her.  Transferred her to the nurse line.

## 2019-01-15 ENCOUNTER — Other Ambulatory Visit: Payer: Self-pay

## 2019-01-15 ENCOUNTER — Other Ambulatory Visit (HOSPITAL_COMMUNITY)
Admission: RE | Admit: 2019-01-15 | Discharge: 2019-01-15 | Disposition: A | Discharge status: Home / Self Care | Payer: 59 | Source: Ambulatory Visit | Attending: Obstetrics & Gynecology | Admitting: Obstetrics & Gynecology

## 2019-01-15 ENCOUNTER — Ambulatory Visit (INDEPENDENT_AMBULATORY_CARE_PROVIDER_SITE_OTHER): Payer: 59 | Admitting: Women's Health

## 2019-01-15 ENCOUNTER — Encounter: Payer: Self-pay | Admitting: Women's Health

## 2019-01-15 VITALS — BP 106/60 | HR 84 | Wt 173.4 lb

## 2019-01-15 DIAGNOSIS — N898 Other specified noninflammatory disorders of vagina: Secondary | ICD-10-CM | POA: Insufficient documentation

## 2019-01-15 DIAGNOSIS — O23592 Infection of other part of genital tract in pregnancy, second trimester: Secondary | ICD-10-CM | POA: Diagnosis not present

## 2019-01-15 DIAGNOSIS — O26892 Other specified pregnancy related conditions, second trimester: Secondary | ICD-10-CM | POA: Diagnosis not present

## 2019-01-15 DIAGNOSIS — Z3482 Encounter for supervision of other normal pregnancy, second trimester: Secondary | ICD-10-CM

## 2019-01-15 DIAGNOSIS — Z3A14 14 weeks gestation of pregnancy: Secondary | ICD-10-CM

## 2019-01-15 DIAGNOSIS — B9689 Other specified bacterial agents as the cause of diseases classified elsewhere: Secondary | ICD-10-CM

## 2019-01-15 DIAGNOSIS — Z1389 Encounter for screening for other disorder: Secondary | ICD-10-CM

## 2019-01-15 DIAGNOSIS — Z331 Pregnant state, incidental: Secondary | ICD-10-CM

## 2019-01-15 DIAGNOSIS — N760 Acute vaginitis: Secondary | ICD-10-CM

## 2019-01-15 LAB — POCT WET PREP (WET MOUNT)
Clue Cells Wet Prep Whiff POC: POSITIVE
Trichomonas Wet Prep HPF POC: ABSENT

## 2019-01-15 LAB — POCT URINALYSIS DIPSTICK OB
Blood, UA: NEGATIVE
Glucose, UA: NEGATIVE
Ketones, UA: NEGATIVE
Leukocytes, UA: NEGATIVE
Nitrite, UA: NEGATIVE

## 2019-01-15 MED ORDER — METRONIDAZOLE 500 MG PO TABS: 500.0000 mg | ORAL_TABLET | Freq: Two times a day (BID) | ORAL | 0 refills | Status: DC

## 2019-01-15 NOTE — Patient Instructions (Signed)
Deanna Hughes, I greatly value your feedback.  If you receive a survey following your visit with [place] today, we appreciate you taking the time to fill it out.  Thanks, [name], CNM, [facility]  [place]!!! It is now [place] [facility] ([place], [place] [address]) Entrance located off of [address]   Nausea & Vomiting  Have saltine crackers or pretzels by your bed and eat a few bites before you raise your head out of bed in the morning  Eat small frequent meals throughout the day instead of large meals  Drink plenty of fluids throughout the day to stay hydrated, just don't drink a lot of fluids with your meals.  This can make your stomach fill up faster making you feel sick  Do not brush your teeth right after you eat  Products with real ginger are good for nausea, like ginger ale and ginger hard candy Make sure it says made with real ginger!  Sucking on sour candy like lemon heads is also good for nausea  If your prenatal vitamins make you nauseated, take them at night so you will sleep through the nausea  Sea Bands  If you feel like you need medicine for the nausea & vomiting please let us know  If you are unable to keep any fluids or food down please let us know   Constipation  Drink plenty of fluid, preferably water, throughout the day  Eat foods high in fiber such as fruits, vegetables, and grains  Exercise, such as walking, is a good way to keep your bowels regular  Drink warm fluids, especially warm prune juice, or decaf coffee  Eat a 1/2 cup of real oatmeal (not instant), 1/2 cup applesauce, and 1/2-1 cup warm prune juice every day  If needed, you may take Colace (docusate sodium) stool softener once or twice a day to help keep the stool soft.   If you still are having problems with constipation, you may take Miralax once daily as needed to help keep your bowels regular.   Home Blood  Pressure Monitoring for Patients   Your provider has recommended that you check your blood pressure (BP) at least once a week at home. If you do not have a blood pressure cuff at home, one will be provided for you. Contact your provider if you have not received your monitor within 1 week.   Helpful Tips for Accurate Home Blood Pressure Checks  . Don't smoke, exercise, or drink caffeine 30 minutes before checking your BP . Use the restroom before checking your BP (a full bladder can raise your pressure) . Relax in a comfortable upright chair . Feet on the ground . Left arm resting comfortably on a flat surface at the level of your heart . Legs uncrossed . Back supported . Sit quietly and don't talk . Place the cuff on your bare arm . Adjust snuggly, so that only two fingertips can fit between your skin and the top of the cuff . Check 2 readings separated by at least one minute . Keep a log of your BP readings . For a visual, please reference this diagram: http://ccnc.care/bpdiagram  Provider Name: [facility]     Phone: [phone]  Zone 1: ALL CLEAR  Continue to monitor your symptoms:  . BP reading is less than 140 (top number) or less than 90 (bottom number)  . No right upper stomach  pain . No headaches or seeing spots . No feeling nauseated or throwing up . No swelling in face and hands  Zone 2: CAUTION Call your doctor's office for any of the following:  . BP reading is greater than 140 (top number) or greater than 90 (bottom number)  . Stomach pain under your ribs in the middle or right side . Headaches or seeing spots . Feeling nauseated or throwing up . Swelling in face and hands  Zone 3: EMERGENCY  Seek immediate medical care if you have any of the following:  . BP reading is greater than160 (top number) or greater than 110 (bottom number) . Severe headaches not improving with Tylenol . Serious difficulty catching your breath . Any worsening symptoms from Zone  2    First Trimester of Pregnancy The first trimester of pregnancy is from week 1 until the end of week 12 (months 1 through 3). A week after a sperm fertilizes an egg, the egg will implant on the wall of the uterus. This embryo will begin to develop into a baby. Genes from you and your partner are forming the baby. The female genes determine whether the baby is a boy or a girl. At 6-8 weeks, the eyes and face are formed, and the heartbeat can be seen on ultrasound. At the end of 12 weeks, all the baby's organs are formed.  Now that you are pregnant, you will want to do everything you can to have a healthy baby. Two of the most important things are to get good prenatal care and to follow your health care provider's instructions. Prenatal care is all the medical care you receive before the baby's birth. This care will help prevent, find, and treat any problems during the pregnancy and childbirth. BODY CHANGES Your body goes through many changes during pregnancy. The changes vary from woman to woman.   You may gain or lose a couple of pounds at first.  You may feel sick to your stomach (nauseous) and throw up (vomit). If the vomiting is uncontrollable, call your health care provider.  You may tire easily.  You may develop headaches that can be relieved by medicines approved by your health care provider.  You may urinate more often. Painful urination may mean you have a bladder infection.  You may develop heartburn as a result of your pregnancy.  You may develop constipation because certain hormones are causing the muscles that push waste through your intestines to slow down.  You may develop hemorrhoids or swollen, bulging veins (varicose veins).  Your breasts may begin to grow larger and become tender. Your nipples may stick out more, and the tissue that surrounds them (areola) may become darker.  Your gums may bleed and may be sensitive to brushing and flossing.  Dark spots or blotches  (chloasma, mask of pregnancy) may develop on your face. This will likely fade after the baby is born.  Your menstrual periods will stop.  You may have a loss of appetite.  You may develop cravings for certain kinds of food.  You may have changes in your emotions from day to day, such as being excited to be pregnant or being concerned that something may go wrong with the pregnancy and baby.  You may have more vivid and strange dreams.  You may have changes in your hair. These can include thickening of your hair, rapid growth, and changes in texture. Some women also have hair loss during or after pregnancy, or hair that  feels dry or thin. Your hair will most likely return to normal after your baby is born. WHAT TO EXPECT AT YOUR PRENATAL VISITS During a routine prenatal visit:  You will be weighed to make sure you and the baby are growing normally.  Your blood pressure will be taken.  Your abdomen will be measured to track your baby's growth.  The fetal heartbeat will be listened to starting around week 10 or 12 of your pregnancy.  Test results from any previous visits will be discussed. Your health care provider may ask you:  How you are feeling.  If you are feeling the baby move.  If you have had any abnormal symptoms, such as leaking fluid, bleeding, severe headaches, or abdominal cramping.  If you have any questions. Other tests that may be performed during your first trimester include:  Blood tests to find your blood type and to check for the presence of any previous infections. They will also be used to check for low iron levels (anemia) and Rh antibodies. Later in the pregnancy, blood tests for diabetes will be done along with other tests if problems develop.  Urine tests to check for infections, diabetes, or protein in the urine.  An ultrasound to confirm the proper growth and development of the baby.  An amniocentesis to check for possible genetic problems.  Fetal  screens for spina bifida and Down syndrome.  You may need other tests to make sure you and the baby are doing well. HOME CARE INSTRUCTIONS  Medicines  Follow your health care provider's instructions regarding medicine use. Specific medicines may be either safe or unsafe to take during pregnancy.  Take your prenatal vitamins as directed.  If you develop constipation, try taking a stool softener if your health care provider approves. Diet  Eat regular, well-balanced meals. Choose a variety of foods, such as meat or vegetable-based protein, fish, milk and low-fat dairy products, vegetables, fruits, and whole grain breads and cereals. Your health care provider will help you determine the amount of weight gain that is right for you.  Avoid raw meat and uncooked cheese. These carry germs that can cause birth defects in the baby.  Eating four or five small meals rather than three large meals a day may help relieve nausea and vomiting. If you start to feel nauseous, eating a few soda crackers can be helpful. Drinking liquids between meals instead of during meals also seems to help nausea and vomiting.  If you develop constipation, eat more high-fiber foods, such as fresh vegetables or fruit and whole grains. Drink enough fluids to keep your urine clear or pale yellow. Activity and Exercise  Exercise only as directed by your health care provider. Exercising will help you:  Control your weight.  Stay in shape.  Be prepared for labor and delivery.  Experiencing pain or cramping in the lower abdomen or low back is a good sign that you should stop exercising. Check with your health care provider before continuing normal exercises.  Try to avoid standing for long periods of time. Move your legs often if you must stand in one place for a long time.  Avoid heavy lifting.  Wear low-heeled shoes, and practice good posture.  You may continue to have sex unless your health care provider directs you  otherwise. Relief of Pain or Discomfort  Wear a good support bra for breast tenderness.    Take warm sitz baths to soothe any pain or discomfort caused by hemorrhoids. Use hemorrhoid cream  if your health care provider approves.    Rest with your legs elevated if you have leg cramps or low back pain.  If you develop varicose veins in your legs, wear support hose. Elevate your feet for 15 minutes, 3-4 times a day. Limit salt in your diet. Prenatal Care  Schedule your prenatal visits by the twelfth week of pregnancy. They are usually scheduled monthly at first, then more often in the last 2 months before delivery.  Write down your questions. Take them to your prenatal visits.  Keep all your prenatal visits as directed by your health care provider. Safety  Wear your seat belt at all times when driving.  Make a list of emergency phone numbers, including numbers for family, friends, the hospital, and police and fire departments. General Tips  Ask your health care provider for a referral to a local prenatal education class. Begin classes no later than at the beginning of month 6 of your pregnancy.  Ask for help if you have counseling or nutritional needs during pregnancy. Your health care provider can offer advice or refer you to specialists for help with various needs.  Do not use hot tubs, steam rooms, or saunas.  Do not douche or use tampons or scented sanitary pads.  Do not cross your legs for long periods of time.  Avoid cat litter boxes and soil used by cats. These carry germs that can cause birth defects in the baby and possibly loss of the fetus by miscarriage or stillbirth.  Avoid all smoking, herbs, alcohol, and medicines not prescribed by your health care provider. Chemicals in these affect the formation and growth of the baby.  Schedule a dentist appointment. At home, brush your teeth with a soft toothbrush and be gentle when you floss. SEEK MEDICAL CARE IF:   You have  dizziness.  You have mild pelvic cramps, pelvic pressure, or nagging pain in the abdominal area.  You have persistent nausea, vomiting, or diarrhea.  You have a bad smelling vaginal discharge.  You have pain with urination.  You notice increased swelling in your face, hands, legs, or ankles. SEEK IMMEDIATE MEDICAL CARE IF:   You have a fever.  You are leaking fluid from your vagina.  You have spotting or bleeding from your vagina.  You have severe abdominal cramping or pain.  You have rapid weight gain or loss.  You vomit blood or material that looks like coffee grounds.  You are exposed to [place] measles and have never had them.  You are exposed to fifth disease or chickenpox.  You develop a severe headache.  You have shortness of breath.  You have any kind of trauma, such as from a fall or a car accident. Document Released: 12/28/2000 Document Revised: 05/20/2013 Document Reviewed: 11/13/2012 [facility] Patient Information 2015 [place], [place]. This information is not intended to replace advice given to you by your health care provider. Make sure you discuss any questions you have with your health care provider.  Coronavirus (COVID-19) Are you at risk?  Are you at risk for the Coronavirus (COVID-19)?  To be considered HIGH RISK for Coronavirus (COVID-19), you have to meet the following criteria:  . Traveled to [place], [place], [place], [place] or [place]; or in the [place] to [place], [place], [place], or [place]; and have fever, cough, and shortness of breath within the last 2 weeks of travel OR . Been in close contact with a person diagnosed with COVID-19 within the last 2  weeks and have fever, cough, and shortness of breath . IF YOU DO NOT MEET THESE CRITERIA, YOU ARE CONSIDERED LOW RISK FOR COVID-19.  What to do if you are HIGH RISK for COVID-19?  Marland Kitchen If you are having a medical emergency, call 911. . Seek medical care right away. Before you go to a  doctor's office, urgent care or emergency department, call ahead and tell them about your recent travel, contact with someone diagnosed with COVID-19, and your symptoms. You should receive instructions from your physician's office regarding next steps of care.  . When you arrive at healthcare provider, tell the healthcare staff immediately you have returned from visiting [place], [place], [place], [place] or [place]; or traveled in the [place] to [place], [place], [place], or [place]; in the last two weeks or you have been in close contact with a person diagnosed with COVID-19 in the last 2 weeks.   . Tell the health care staff about your symptoms: fever, cough and shortness of breath. . After you have been seen by a medical provider, you will be either: o Tested for (COVID-19) and discharged home on quarantine except to seek medical care if symptoms worsen, and asked to  - Stay home and avoid contact with others until you get your results (4-5 days)  - Avoid travel on public transportation if possible (such as bus, train, or airplane) or o Sent to the Emergency Department by EMS for evaluation, COVID-19 testing, and possible admission depending on your condition and test results.  What to do if you are LOW RISK for COVID-19?  Reduce your risk of any infection by using the same precautions used for avoiding the common cold or flu:  Marland Kitchen Wash your hands often with soap and warm water for at least 20 seconds.  If soap and water are not readily available, use an alcohol-based hand sanitizer with at least 60% alcohol.  . If coughing or sneezing, cover your mouth and nose by coughing or sneezing into the elbow areas of your shirt or coat, into a tissue or into your sleeve (not your hands). . Avoid shaking hands with others and consider head nods or verbal greetings only. . Avoid touching your eyes, nose, or mouth with unwashed hands.  . Avoid close contact with people who are sick. . Avoid  places or events with large numbers of people in one location, like concerts or sporting events. . Carefully consider travel plans you have or are making. . If you are planning any travel outside or inside the [place], visit the CDC's Travelers' Health webpage for the latest health notices. . If you have some symptoms but not all symptoms, continue to monitor at home and seek medical attention if your symptoms worsen. . If you are having a medical emergency, call 911.   [place] / e-Visit: [email or web address]         [facility]: [facility]: [id number]                   [facility]: [phone]

## 2019-01-15 NOTE — Progress Notes (Signed)
Work-in LOW-RISK PREGNANCY VISIT Patient name: Deanna Hughes MRN [id number]  Date of birth: 07-26-1990 Chief Complaint:   work-in-ob (vaginal discharge/odor)  History of Present Illness:   Deanna Hughes is a 28 y.o. [phone] female at [redacted]w[redacted]d with an Estimated Date of Delivery: 07/10/19 being seen today for ongoing management of a low-risk pregnancy.  Today she is being seen as a work-in for vaginal discharge, odor, irritation. Contractions: Not present.  .  Movement: Absent. denies leaking of fluid. Review of Systems:   Pertinent items are noted in HPI Denies abnormal vaginal discharge w/ itching/odor/irritation, headaches, visual changes, shortness of breath, chest pain, abdominal pain, severe nausea/vomiting, or problems with urination or bowel movements unless otherwise stated above. Pertinent History Reviewed:  Reviewed past medical,surgical, social, obstetrical and family history.  Reviewed problem list, medications and allergies. Physical Assessment:   Vitals:   01/15/19 1618  BP: 106/60  Pulse: 84  Weight: 173 lb 6.4 oz (78.7 kg)  Body mass index is 31.72 kg/m.        Physical Examination:   General appearance: Well appearing, and in no distress  Mental status: Alert, oriented to person, place, and time  Skin: Warm & dry  Cardiovascular: Normal heart rate noted  Respiratory: Normal respiratory effort, no distress  Abdomen: Soft, gravid, nontender  Pelvic: spec exam: mod amt thin yellow malodorous d/c         Extremities: Edema: None  Fetal Status: Fetal Heart Rate (bpm): 144   Movement: Absent    Chaperone: Levy Pupa    Results for orders placed or performed in visit on 01/15/19 (from the past 24 hour(s))  POC Urinalysis Dipstick OB   Collection Time: 01/15/19  4:27 PM  Result Value Ref Range   Color, UA     Clarity, UA     Glucose, UA Negative Negative   Bilirubin, UA     Ketones, UA neg    Spec Grav, UA     Blood, UA neg    pH, UA     POC,PROTEIN,UA  Trace Negative, Trace, Small (1+), Moderate (2+), Large (3+), 4+   Urobilinogen, UA     Nitrite, UA neg    Leukocytes, UA Negative Negative   Appearance     Odor    POCT Wet Prep [facility])   Collection Time: 01/15/19  4:48 PM  Result Value Ref Range   Source Wet Prep POC vaginal    WBC, Wet Prep HPF POC many    Bacteria Wet Prep HPF POC Few Few   BACTERIA WET PREP MORPHOLOGY POC     Clue Cells Wet Prep HPF POC Few (A) None   Clue Cells Wet Prep Whiff POC Positive Whiff    Yeast Wet Prep HPF POC None None   KOH Wet Prep POC     Trichomonas Wet Prep HPF POC Absent Absent    Assessment & Plan:  1) Low-risk pregnancy [id number] at [redacted]w[redacted]d with an Estimated Date of Delivery: 07/10/19   2) BV, rx flagyl, slide w/ many wbc's suspicious for trich, CV swab for trich sent   Meds:  Meds ordered this encounter  Medications  . metroNIDAZOLE (FLAGYL) 500 MG tablet    Sig: Take 1 tablet (500 mg total) by mouth 2 (two) times daily.    Dispense:  14 tablet    Refill:  0    Order Specific Question:   Supervising Provider    Answer:   Tania Ade H [[id number]]  Labs/procedures today: spec exam, CV swab  Plan:  Continue routine obstetrical care   Reviewed: Preterm labor symptoms and general obstetric precautions including but not limited to vaginal bleeding, contractions, leaking of fluid and fetal movement were reviewed in detail with the patient.  All questions were answered.   Follow-up: Return for As scheduled.  Orders Placed This Encounter  Procedures  . POC Urinalysis Dipstick OB  . POCT Wet Prep [facility])   Roma Schanz [place], [facility] 01/15/2019 4:49 PM

## 2019-01-17 LAB — CERVICOVAGINAL ANCILLARY ONLY
Comment: NEGATIVE
Trichomonas: NEGATIVE

## 2019-01-18 ENCOUNTER — Encounter: Payer: Self-pay | Admitting: Advanced Practice Midwife

## 2019-01-18 NOTE — L&D Delivery Note (Addendum)
OB/GYN Faculty Practice Delivery Note  Deanna Hughes is a 29 y.o. [phone] s/p vaginal delivery at [redacted]w[redacted]d. She was admitted for SOL.   SROM: 2h 17m with clear fluid GBS Status:  Negative/-- (06/03 1330) Maximum Maternal Temperature:  Temp (24hrs), Avg:98.3 F (36.8 C), Min:97.8 F (36.6 C), Max:99 F (37.2 C)  Labor Progress: Patient arrived at 4.5 cm dilation and induced with pitocin.   Delivery Date/Time: 07/10/2019 at [place] Delivery: Called to room and patient was complete and pushing. Head delivered in LOA position. No nuchal cord present. Shoulder and body delivered in usual fashion. Infant with spontaneous cry, placed on mother's abdomen, dried and stimulated. Cord clamped x 2 after 1-minute delay, and cut by delivering MD. Cord blood drawn. Placenta delivered spontaneously with gentle cord traction. Fundus firm with massage and Pitocin. Labia, perineum, vagina, and cervix inspected with no lacerations.   Placenta: spontaneous and intact  Complications: none  Lacerations: none EBL:  Analgesia: epidural    Infant: APGAR (1 MIN): 9   APGAR (5 MINS): 9   APGAR (10 MINS):    Weight: pending   Eulis Foster, MD Family Medicine PGY-1   I was gloved and present for delivery in its entirety.  Second stage of labor progressed, baby delivered after 2 contractions.   Complications: compound hand presentation  Lacerations: n/a  EBL: [place], CNM 7:05 PM

## 2019-01-21 ENCOUNTER — Encounter: Payer: Self-pay | Admitting: Advanced Practice Midwife

## 2019-01-21 ENCOUNTER — Telehealth (INDEPENDENT_AMBULATORY_CARE_PROVIDER_SITE_OTHER): Payer: 59 | Admitting: Advanced Practice Midwife

## 2019-01-21 DIAGNOSIS — Z3482 Encounter for supervision of other normal pregnancy, second trimester: Secondary | ICD-10-CM

## 2019-01-21 DIAGNOSIS — Z3A15 15 weeks gestation of pregnancy: Secondary | ICD-10-CM

## 2019-01-21 NOTE — Progress Notes (Signed)
   TELEHEALTH VIRTUAL OBSTETRICS VISIT ENCOUNTER NOTE Patient name: Deanna Hughes MRN [id number]  Date of birth: 06/21/1990  I connected with patient on 01/21/19 at  9:10 AM EST by MyChart and verified that I am speaking with the correct person using two identifiers. Due to COVID-19 recommendations, pt is not currently in our office.    I discussed the limitations, risks, security and privacy concerns of performing an evaluation and management service by telephone and the availability of in person appointments. I also discussed with the patient that there may be a patient responsible charge related to this service. The patient expressed understanding and agreed to proceed.  Chief Complaint:   Routine Prenatal Visit  History of Present Illness:   Deanna Hughes is a 29 y.o. [phone] female at [redacted]w[redacted]d with an Estimated Date of Delivery: 07/10/19 being evaluated today for ongoing management of a low-risk pregnancy.  Today she reports nausea better, but still not much appetite. Contractions: Not present. Vag. Bleeding: None.   . denies leaking of fluid. Review of Systems:   Pertinent items are noted in HPI Denies abnormal vaginal discharge w/ itching/odor/irritation, headaches, visual changes, shortness of breath, chest pain, abdominal pain, severe nausea/vomiting, or problems with urination or bowel movements unless otherwise stated above. Pertinent History Reviewed:  Reviewed past medical,surgical, social, obstetrical and family history.  Reviewed problem list, medications and allergies. Physical Assessment:   Vitals:   01/21/19 0914  BP: 105/69  Pulse: (!) 108  There is no height or weight on file to calculate BMI.        Physical Examination:   General:  Alert, oriented and cooperative.   Mental Status: Normal mood and affect perceived. Normal judgment and thought content.  Rest of physical exam deferred due to type of encounter  No results found for this or any previous visit (from  the past 24 hour(s)).  Assessment & Plan:  1) Pregnancy [id number] at [redacted]w[redacted]d with an Estimated Date of Delivery: 07/10/19   2) Recent BV infection, still taking Flagyl and has noticed improvement   Meds: No orders of the defined types were placed in this encounter.   Labs/procedures today: (doesn't appear that Materniti21 was drawn)  Plan:  Continue routine obstetrical care.  Unsure re home bp cuff.  Check bp weekly, let us know if >140/90.  Next visit:  will be in person for anatomy U/S and 2nd IT    Reviewed: Preterm labor symptoms and general obstetric precautions including but not limited to vaginal bleeding, contractions, leaking of fluid and fetal movement were reviewed in detail with the patient. The patient was advised to call back or seek an in-person office evaluation/go to [facility] at [facility] for any urgent or concerning symptoms. All questions were answered. Please refer to After Visit Summary for other counseling recommendations.    I provided 11 minutes of non-face-to-face time during this encounter.  Follow-up: No follow-ups on file.  Orders Placed This Encounter  Procedures  . US OB Comp + 14 Wk   Fox Salminen D Gracey Tolle CNM 01/21/2019 9:54 AM

## 2019-01-22 ENCOUNTER — Telehealth: Payer: 59 | Admitting: Women's Health

## 2019-02-06 ENCOUNTER — Encounter: Payer: Self-pay | Admitting: Advanced Practice Midwife

## 2019-02-06 ENCOUNTER — Encounter: Payer: Self-pay | Admitting: Family Medicine

## 2019-02-06 ENCOUNTER — Other Ambulatory Visit: Payer: Self-pay

## 2019-02-06 ENCOUNTER — Ambulatory Visit (INDEPENDENT_AMBULATORY_CARE_PROVIDER_SITE_OTHER): Payer: 59 | Admitting: Family Medicine

## 2019-02-06 VITALS — BP 108/50 | HR 90 | Temp 98.3°F | Resp 15 | Ht 62.0 in | Wt 171.0 lb

## 2019-02-06 DIAGNOSIS — G43009 Migraine without aura, not intractable, without status migrainosus: Secondary | ICD-10-CM | POA: Diagnosis not present

## 2019-02-06 NOTE — Progress Notes (Signed)
Subjective:  Patient ID: Deanna Hughes, female    DOB: 1990-07-10  Age: 29 y.o. MRN: [id number]  CC:  Chief Complaint  Patient presents with  . Headache    migraines follow up fmla paperwork      HPI  HPI   Deanna Hughes is a 29 y.o. female with history of anxiety, chronic back pain, migraines, scoliosis, is currently [redacted] weeks pregnant from 5 days.  She is here to follow-up on her migraines.  Secondary to becoming pregnant she has had to stop several of her preventative medications and she has been struggling to get her migraines under control.  She reports Tylenol just does not really work for her.  She was using Maxalt and that was helping very well.  She had stopped taking Elavil previously last year but thought that it might actually be helping her so she had recently started back until finding out she was pregnant.  She is not yet as the OB will be the best for her to take on a regular basis outside of Tylenol. Currently she denies having any nausea vomiting, current headache here in the office.  Reports that when she does have a migraine is very debilitating light sensitivity, nausea at times of vomiting.  Today patient denies signs and symptoms of COVID 19 infection including fever, chills, cough, shortness of breath, and headache. Past Medical, Surgical, Social History, Allergies, and Medications have been Reviewed.   Past Medical History:  Diagnosis Date  . Anxiety   . BV (bacterial vaginosis) 04/02/2015  . Chlamydia    treated 6/6  . Chronic back pain   . Chronic headaches    MIGRAINES  . Contraceptive management 01/25/2013  . Encounter for Nexplanon removal 02/27/2015  . Irregular bleeding 06/03/2013  . Miscarriage 03/27/2013  . Nexplanon insertion 04/11/2013   nexplanon inserted 04/11/13 remove 3/32/18  . Right sided abdominal pain   . Scoliosis   . Seasonal allergies 07/03/2012  . Sinus infection 08/27/2013  . Supervision of normal pregnancy 08/27/2015   [facility] at   7+5 weeks  FOB  Dating By  LMP  Pap  GC/CT Initial:                36+wks: Genetic Screen NT/IT:  CF screen  Anatomic [place]  Flu vaccine  Tdap Recommended ~ 28wks Glucose Screen  2 hr GBS  Feed Preference  Contraception  Circumcision  Childbirth Classes  Pediatrician    . Threatened abortion in early pregnancy 03/20/2013  . Vaginal discharge 10/03/2012  . Yeast infection 07/03/2012    Current Meds  Medication Sig  . ondansetron (ZOFRAN ODT) 4 MG disintegrating tablet Take 1 tablet (4 mg total) by mouth every 6 (six) hours as needed for nausea.  . prenatal vitamin w/FE, FA (PRENATAL 1 + 1) 27-1 MG TABS tablet Take 1 tablet by mouth daily at 12 noon.    ROS:  Review of Systems  Constitutional: Negative.   HENT: Negative.   Eyes: Negative.   Respiratory: Negative.   Cardiovascular: Negative.   Gastrointestinal: Negative.   Genitourinary: Negative.   Musculoskeletal: Negative.   Skin: Negative.   Neurological: Negative.   Endo/Heme/Allergies: Negative.   Psychiatric/Behavioral: Negative.   All other systems reviewed and are negative.    Objective:   Today's Vitals: BP (!) 108/50   Pulse 90   Temp 98.3 F (36.8 C) (Oral)   Resp 15   Ht 5\' 2"  (1.575 m)  Wt 171 lb (77.6 kg)   LMP 09/23/2018 (Exact Date)   SpO2 98%   BMI 31.28 kg/m  Vitals with BMI 02/06/2019 01/21/2019 01/15/2019  Height 5\' 2"  - -  Weight 171 lbs - 173 lbs 6 oz  BMI Q000111Q - [id number]  Systolic [id number] [id number]  Diastolic 50 69 60  Pulse 90 108 84     Physical Exam Vitals and nursing note reviewed.  Constitutional:      Appearance: Normal appearance. She is well-developed and well-groomed. She is obese.  HENT:     Head: Normocephalic and atraumatic.     Right Ear: External ear normal.     Left Ear: External ear normal.     Nose: Nose normal.     Mouth/Throat:     Mouth: Mucous membranes are moist.     Pharynx: Oropharynx is clear.  Eyes:     General:        Right eye: No  discharge.        Left eye: No discharge.     Conjunctiva/sclera: Conjunctivae normal.  Cardiovascular:     Rate and Rhythm: Normal rate and regular rhythm.     Pulses: Normal pulses.     Heart sounds: Normal heart sounds.  Pulmonary:     Effort: Pulmonary effort is normal.     Breath sounds: Normal breath sounds.  Musculoskeletal:        General: Normal range of motion.     Cervical back: Normal range of motion and neck supple.  Skin:    General: Skin is warm.  Neurological:     General: No focal deficit present.     Mental Status: She is alert and oriented to person, place, and time.  Psychiatric:        Attention and Perception: Attention normal.        Mood and Affect: Mood normal.        Speech: Speech normal.        Behavior: Behavior normal. Behavior is cooperative.        Thought Content: Thought content normal.        Cognition and Memory: Cognition normal.        Judgment: Judgment normal.     Assessment   1. Migraine without aura and without status migrainosus, not intractable     Tests ordered No orders of the defined types were placed in this encounter.  Plan: Please see assessment and plan per problem list above.   No orders of the defined types were placed in this encounter.   Patient to follow-up in 3 months  Perlie Mayo, NP

## 2019-02-06 NOTE — Progress Notes (Deleted)
     Subjective:  Patient ID: Deanna Hughes, female    DOB: Dec 22, 1990  Age: 29 y.o. MRN: [id number]  CC:  Chief Complaint  Patient presents with  . Headache    migraines follow up fmla paperwork      HPI  HPI  Today patient denies signs and symptoms of COVID 19 infection including fever, chills, cough, shortness of breath, and headache. Past Medical, Surgical, Social History, Allergies, and Medications have been Reviewed.   Past Medical History:  Diagnosis Date  . Anxiety   . BV (bacterial vaginosis) 04/02/2015  . Chlamydia    treated 6/6  . Chronic back pain   . Chronic headaches    MIGRAINES  . Contraceptive management 01/25/2013  . Encounter for Nexplanon removal 02/27/2015  . Irregular bleeding 06/03/2013  . Miscarriage 03/27/2013  . Nexplanon insertion 04/11/2013   nexplanon inserted 04/11/13 remove 3/32/18  . Right sided abdominal pain   . Scoliosis   . Seasonal allergies 07/03/2012  . Sinus infection 08/27/2013  . Supervision of normal pregnancy 08/27/2015    [facility] at   7+5 weeks  FOB  Dating By  LMP  Pap  GC/CT Initial:                36+wks: Genetic Screen NT/IT:  CF screen  Anatomic [place]  Flu vaccine  Tdap Recommended ~ 28wks Glucose Screen  2 hr GBS  Feed Preference  Contraception  Circumcision  Childbirth Classes  Pediatrician    . Threatened abortion in early pregnancy 03/20/2013  . Vaginal discharge 10/03/2012  . Yeast infection 07/03/2012    Current Meds  Medication Sig  . ondansetron (ZOFRAN ODT) 4 MG disintegrating tablet Take 1 tablet (4 mg total) by mouth every 6 (six) hours as needed for nausea.  . prenatal vitamin w/FE, FA (PRENATAL 1 + 1) 27-1 MG TABS tablet Take 1 tablet by mouth daily at 12 noon.    ROS:  ROS   Objective:   Today's Vitals: BP (!) 108/50   Pulse 90   Temp 98.3 F (36.8 C) (Oral)   Resp 15   Ht 5\' 2"  (1.575 m)   Wt 171 lb (77.6 kg)   LMP 09/23/2018 (Exact Date)   SpO2 98%   BMI 31.28 kg/m    Vitals with BMI 02/06/2019 01/21/2019 01/15/2019  Height 5\' 2"  - -  Weight 171 lbs - 173 lbs 6 oz  BMI Q000111Q - [id number]  Systolic 123XX123 [id number]  Diastolic 50 69 60  Pulse 90 108 84     Physical Exam       Assessment   No diagnosis found.  Tests ordered No orders of the defined types were placed in this encounter.    Plan: Please see assessment and plan per problem list above.   No orders of the defined types were placed in this encounter.   Patient to follow-up in ***.  Perlie Mayo, NP

## 2019-02-06 NOTE — Patient Instructions (Signed)
Happy New Year! May you have a year filled with hope, love, happiness and laughter.  I appreciate the opportunity to provide you with care for your health and wellness. Today we discussed: migraines  Follow up: 3 months -phone   No labs or referrals today  Pregnancy Safe Medications: Tylenol is number one. Please ask OB for recommendations on a daily medication or use of tylenol with codeine, or Firocet as needed (remember these have limited days per month you can use them).  OB might have another ideas.  I will complete FMLA paper and send it in.  Please continue to practice social distancing to keep you, your family, and our community safe.  If you must go out, please wear a mask and practice good handwashing.  It was a pleasure to see you and I look forward to continuing to work together on your health and well-being. Please do not hesitate to call the office if you need care or have questions about your care.  Have a wonderful day and week. With Gratitude, Cherly Beach, DNP, AGNP-BC

## 2019-02-11 DIAGNOSIS — G43009 Migraine without aura, not intractable, without status migrainosus: Secondary | ICD-10-CM | POA: Insufficient documentation

## 2019-02-11 NOTE — Assessment & Plan Note (Signed)
Pregnancy Safe Medications: Tylenol is number one. I have asked her to check with her OB for recommendations on daily use of Tylenol with codeine  or Firocet as needed (remember these have limited days per month you can use them).  OB might have another ideas.  Appreciate collaboration in her care.  Please let PCP know if there is anything we can do from RN.  FMLA paperwork will be filled out and submitted on her behalf.  For migraines.

## 2019-02-13 DIAGNOSIS — G43009 Migraine without aura, not intractable, without status migrainosus: Secondary | ICD-10-CM

## 2019-02-15 ENCOUNTER — Other Ambulatory Visit: Payer: Self-pay | Admitting: Advanced Practice Midwife

## 2019-02-15 DIAGNOSIS — Z363 Encounter for antenatal screening for malformations: Secondary | ICD-10-CM

## 2019-02-15 DIAGNOSIS — Z3482 Encounter for supervision of other normal pregnancy, second trimester: Secondary | ICD-10-CM

## 2019-02-18 ENCOUNTER — Ambulatory Visit (INDEPENDENT_AMBULATORY_CARE_PROVIDER_SITE_OTHER): Payer: 59 | Admitting: Advanced Practice Midwife

## 2019-02-18 ENCOUNTER — Encounter: Payer: Self-pay | Admitting: Advanced Practice Midwife

## 2019-02-18 ENCOUNTER — Ambulatory Visit (INDEPENDENT_AMBULATORY_CARE_PROVIDER_SITE_OTHER): Payer: 59

## 2019-02-18 ENCOUNTER — Other Ambulatory Visit: Payer: Self-pay

## 2019-02-18 VITALS — BP 110/67 | HR 95 | Wt 167.8 lb

## 2019-02-18 DIAGNOSIS — Z331 Pregnant state, incidental: Secondary | ICD-10-CM

## 2019-02-18 DIAGNOSIS — N898 Other specified noninflammatory disorders of vagina: Secondary | ICD-10-CM

## 2019-02-18 DIAGNOSIS — Z3A19 19 weeks gestation of pregnancy: Secondary | ICD-10-CM

## 2019-02-18 DIAGNOSIS — Z363 Encounter for antenatal screening for malformations: Secondary | ICD-10-CM | POA: Diagnosis not present

## 2019-02-18 DIAGNOSIS — Z1379 Encounter for other screening for genetic and chromosomal anomalies: Secondary | ICD-10-CM

## 2019-02-18 DIAGNOSIS — Z3482 Encounter for supervision of other normal pregnancy, second trimester: Secondary | ICD-10-CM

## 2019-02-18 DIAGNOSIS — Z1389 Encounter for screening for other disorder: Secondary | ICD-10-CM

## 2019-02-18 DIAGNOSIS — O26892 Other specified pregnancy related conditions, second trimester: Secondary | ICD-10-CM | POA: Diagnosis not present

## 2019-02-18 DIAGNOSIS — R630 Anorexia: Secondary | ICD-10-CM

## 2019-02-18 LAB — POCT URINALYSIS DIPSTICK OB
Blood, UA: NEGATIVE
Glucose, UA: NEGATIVE
Ketones, UA: NEGATIVE
Leukocytes, UA: NEGATIVE
Nitrite, UA: NEGATIVE

## 2019-02-18 LAB — POCT WET PREP (WET MOUNT): Trichomonas Wet Prep HPF POC: ABSENT

## 2019-02-18 NOTE — Progress Notes (Signed)
LOW-RISK PREGNANCY VISIT Patient name: Deanna Hughes MRN [id number]  Date of birth: [birth year] Chief Complaint:   Routine Prenatal Visit ([place] & PN2 today; vaginal discharge with odor; feet swelling and ankles are brusing; no appetite  )  History of Present Illness:   Deanna Hughes is a 29 y.o. [id number] female at [redacted]w[redacted]d with an Estimated Date of Delivery: 07/10/19 being seen today for ongoing management of a low-risk pregnancy.  Today she reports white vag d/c, itching every once in awhile, no odor. Concerned re loss of weight, no appetite. Contractions: Not present. Vag. Bleeding: None.  Movement: Absent. denies leaking of fluid. Review of Systems:   Pertinent items are noted in HPI Denies abnormal vaginal discharge w/ itching/odor/irritation, headaches, visual changes, shortness of breath, chest pain, abdominal pain, severe nausea/vomiting, or problems with urination or bowel movements unless otherwise stated above. Pertinent History Reviewed:  Reviewed past medical,surgical, social, obstetrical and family history.  Reviewed problem list, medications and allergies. Physical Assessment:   Vitals:   02/18/19 0955  BP: 110/67  Pulse: 95  Weight: 167 lb 12.8 oz (76.1 kg)  Body mass index is 30.69 kg/m.        Physical Examination:   General appearance: Well appearing, and in no distress  Mental status: Alert, oriented to person, place, and time  Skin: Warm & dry  Cardiovascular: Normal heart rate noted  Respiratory: Normal respiratory effort, no distress  Abdomen: Soft, gravid, nontender  Pelvic: Cervical exam deferred; SE: sm white d/c        Extremities: Edema: Trace  Fetal Status: Fetal Heart Rate (bpm): 144 u/s   Movement: Absent     Anatomy U/S:  [place] 19+5 wks,breech,anterior placenta gr 0,normal ovaries,svp of fluid 5.2 cm,fhr 144 bpm,efw 358 g 85%,anaotmy complete,no obvious abnormalities    Results for orders placed or performed in visit on 02/18/19 (from the past 24  hour(s))  POC Urinalysis Dipstick OB   Collection Time: 02/18/19  9:56 AM  Result Value Ref Range   Color, UA     Clarity, UA     Glucose, UA Negative Negative   Bilirubin, UA     Ketones, UA neg    Spec Grav, UA     Blood, UA neg    pH, UA     POC,PROTEIN,UA Trace Negative, Trace, Small (1+), Moderate (2+), Large (3+), 4+   Urobilinogen, UA     Nitrite, UA neg    Leukocytes, UA Negative Negative   Appearance     Odor    POCT Wet Prep [facility])   Collection Time: 02/18/19 10:20 AM  Result Value Ref Range   Source Wet Prep POC vag    WBC, Wet Prep HPF POC few    Bacteria Wet Prep HPF POC Few Few   BACTERIA WET PREP MORPHOLOGY POC     Clue Cells Wet Prep HPF POC None None   Clue Cells Wet Prep Whiff POC     Yeast Wet Prep HPF POC None None   KOH Wet Prep POC     Trichomonas Wet Prep HPF POC Absent Absent    Assessment & Plan:  1) Low-risk pregnancy [id number] at [redacted]w[redacted]d with an Estimated Date of Delivery: 07/10/19   2) Vag discharge, neg wet prep  3) Weight loss due to anorexia, rec make the most of the food she takes in with high calorie/protein   Meds: No orders of the defined types were placed in this encounter.  Labs/procedures today:  2nd IT; MaterniT21 (not drawn earlier)  Plan:  Continue routine obstetrical care   Reviewed: Preterm labor symptoms and general obstetric precautions including but not limited to vaginal bleeding, contractions, leaking of fluid and fetal movement were reviewed in detail with the patient.  All questions were answered. Has home bp cuff.  Check bp weekly, let us know if >140/90.   Follow-up: Return in about 4 weeks (around 03/18/2019) for [place], in person.  Orders Placed This Encounter  Procedures  . INTEGRATED 2  . MaterniT21 PLUS Core  . POC Urinalysis Dipstick OB  . POCT Wet Prep [facility])   Myrtis Ser [facility] 02/18/2019 10:21 AM

## 2019-02-18 NOTE — Progress Notes (Signed)
[place] 19+5 wks,breech,anterior placenta gr 0,normal ovaries,svp of fluid 5.2 cm,fhr 144 bpm,efw 358 g 85%,anaotmy complete,no obvious abnormalities

## 2019-02-18 NOTE — Patient Instructions (Signed)
Deanna Hughes, I greatly value your feedback.  If you receive a survey following your visit with [place] today, we appreciate you taking the time to fill it out.  Thanks, Derrill Memo, CNM,  [place]!!! It is now [place] at [facility] ([place], [place] [address]) Entrance located off of [address] parking   Go to [email or web address] to register for FREE online childbirth classes  [place] Pediatricians/Family Doctors:  [facility] [place] [phone]                 [place] [phone] (usually not accepting new patients unless you have family there already, you are always welcome to call and ask)       [facility] Department [phone]       [facility] Pediatricians/Family Doctors:   [facility]: [phone]  [facility]: [phone]  [facility]: [place] Doctors:   [facility]: [facility]: [place]:  [place]: [phone]    Home Blood Pressure Monitoring for Patients   Your provider has recommended that you check your blood pressure (BP) at least once a week at home. If you do not have a blood pressure cuff at home, one will be provided for you. Contact your provider if you have not received your monitor within 1 week.   Helpful Tips for Accurate Home Blood Pressure Checks  . Don't smoke, exercise, or drink caffeine 30 minutes before checking your BP . Use the restroom before checking your BP (a full bladder can raise your pressure) . Relax in a comfortable upright chair . Feet on the ground . Left arm resting comfortably on a flat surface at the level of your heart . Legs uncrossed . Back supported . Sit quietly and don't talk . Place the cuff on your  bare arm . Adjust snuggly, so that only two fingertips can fit between your skin and the top of the cuff . Check 2 readings separated by at least one minute . Keep a log of your BP readings . For a visual, please reference this diagram: http://ccnc.care/bpdiagram  Provider Name: [facility]     Phone: [phone]  Zone 1: ALL CLEAR  Continue to monitor your symptoms:  . BP reading is less than 140 (top number) or less than 90 (bottom number)  . No right upper stomach pain . No headaches or seeing spots . No feeling nauseated or throwing up . No swelling in face and hands  Zone 2: CAUTION Call your doctor's office for any of the following:  . BP reading is greater than 140 (top number) or greater than 90 (bottom number)  . Stomach pain under your ribs in the middle or right side . Headaches or seeing spots . Feeling nauseated or throwing up . Swelling in face and hands  Zone 3: EMERGENCY  Seek immediate medical care if you have any of the following:  . BP reading is greater than160 (top number) or greater than 110 (bottom number) . Severe headaches not improving with Tylenol . Serious difficulty catching your breath . Any worsening symptoms from Zone 2     Second Trimester of Pregnancy The second trimester is from week 14 through week 27 (months 4 through 6). The second trimester is often  a time when you feel your best. Your body has adjusted to being pregnant, and you begin to feel better physically. Usually, morning sickness has lessened or quit completely, you may have more energy, and you may have an increase in appetite. The second trimester is also a time when the fetus is growing rapidly. At the end of the sixth month, the fetus is about 9 inches long and weighs about 1 pounds. You will likely begin to feel the baby move (quickening) between 16 and 20 weeks of pregnancy. Body changes during your second trimester Your body continues to go through many changes during  your second trimester. The changes vary from woman to woman.  Your weight will continue to increase. You will notice your lower abdomen bulging out.  You may begin to get stretch marks on your hips, abdomen, and breasts.  You may develop headaches that can be relieved by medicines. The medicines should be approved by your health care provider.  You may urinate more often because the fetus is pressing on your bladder.  You may develop or continue to have heartburn as a result of your pregnancy.  You may develop constipation because certain hormones are causing the muscles that push waste through your intestines to slow down.  You may develop hemorrhoids or swollen, bulging veins (varicose veins).  You may have back pain. This is caused by: ? Weight gain. ? Pregnancy hormones that are relaxing the joints in your pelvis. ? A shift in weight and the muscles that support your balance.  Your breasts will continue to grow and they will continue to become tender.  Your gums may bleed and may be sensitive to brushing and flossing.  Dark spots or blotches (chloasma, mask of pregnancy) may develop on your face. This will likely fade after the baby is born.  A dark line from your belly button to the pubic area (linea nigra) may appear. This will likely fade after the baby is born.  You may have changes in your hair. These can include thickening of your hair, rapid growth, and changes in texture. Some women also have hair loss during or after pregnancy, or hair that feels dry or thin. Your hair will most likely return to normal after your baby is born.  What to expect at prenatal visits During a routine prenatal visit:  You will be weighed to make sure you and the fetus are growing normally.  Your blood pressure will be taken.  Your abdomen will be measured to track your baby's growth.  The fetal heartbeat will be listened to.  Any test results from the previous visit will be  discussed.  Your health care provider may ask you:  How you are feeling.  If you are feeling the baby move.  If you have had any abnormal symptoms, such as leaking fluid, bleeding, severe headaches, or abdominal cramping.  If you are using any tobacco products, including cigarettes, chewing tobacco, and electronic cigarettes.  If you have any questions.  Other tests that may be performed during your second trimester include:  Blood tests that check for: ? Low iron levels (anemia). ? High blood sugar that affects pregnant women (gestational diabetes) between 22 and 28 weeks. ? Rh antibodies. This is to check for a protein on red blood cells (Rh factor).  Urine tests to check for infections, diabetes, or protein in the urine.  An ultrasound to confirm the proper growth and development of the baby.  An amniocentesis to check  for possible genetic problems.  Fetal screens for spina bifida and Down syndrome.  HIV (human immunodeficiency virus) testing. Routine prenatal testing includes screening for HIV, unless you choose not to have this test.  Follow these instructions at home: Medicines  Follow your health care provider's instructions regarding medicine use. Specific medicines may be either safe or unsafe to take during pregnancy.  Take a prenatal vitamin that contains at least 600 micrograms (mcg) of folic acid.  If you develop constipation, try taking a stool softener if your health care provider approves. Eating and drinking  Eat a balanced diet that includes fresh fruits and vegetables, whole grains, good sources of protein such as meat, eggs, or tofu, and low-fat dairy. Your health care provider will help you determine the amount of weight gain that is right for you.  Avoid raw meat and uncooked cheese. These carry germs that can cause birth defects in the baby.  If you have low calcium intake from food, talk to your health care provider about whether you should take a  daily calcium supplement.  Limit foods that are high in fat and processed sugars, such as fried and sweet foods.  To prevent constipation: ? Drink enough fluid to keep your urine clear or pale yellow. ? Eat foods that are high in fiber, such as fresh fruits and vegetables, whole grains, and beans. Activity  Exercise only as directed by your health care provider. Most women can continue their usual exercise routine during pregnancy. Try to exercise for 30 minutes at least 5 days a week. Stop exercising if you experience uterine contractions.  Avoid heavy lifting, wear low heel shoes, and practice good posture.  A sexual relationship may be continued unless your health care provider directs you otherwise. Relieving pain and discomfort  Wear a good support bra to prevent discomfort from breast tenderness.  Take warm sitz baths to soothe any pain or discomfort caused by hemorrhoids. Use hemorrhoid cream if your health care provider approves.  Rest with your legs elevated if you have leg cramps or low back pain.  If you develop varicose veins, wear support hose. Elevate your feet for 15 minutes, 3-4 times a day. Limit salt in your diet. Prenatal Care  Write down your questions. Take them to your prenatal visits.  Keep all your prenatal visits as told by your health care provider. This is important. Safety  Wear your seat belt at all times when driving.  Make a list of emergency phone numbers, including numbers for family, friends, the hospital, and police and fire departments. General instructions  Ask your health care provider for a referral to a local prenatal education class. Begin classes no later than the beginning of month 6 of your pregnancy.  Ask for help if you have counseling or nutritional needs during pregnancy. Your health care provider can offer advice or refer you to specialists for help with various needs.  Do not use hot tubs, steam rooms, or saunas.  Do not  douche or use tampons or scented sanitary pads.  Do not cross your legs for long periods of time.  Avoid cat litter boxes and soil used by cats. These carry germs that can cause birth defects in the baby and possibly loss of the fetus by miscarriage or stillbirth.  Avoid all smoking, herbs, alcohol, and unprescribed drugs. Chemicals in these products can affect the formation and growth of the baby.  Do not use any products that contain nicotine or tobacco, such as cigarettes  and e-cigarettes. If you need help quitting, ask your health care provider.  Visit your dentist if you have not gone yet during your pregnancy. Use a soft toothbrush to brush your teeth and be gentle when you floss. Contact a health care provider if:  You have dizziness.  You have mild pelvic cramps, pelvic pressure, or nagging pain in the abdominal area.  You have persistent nausea, vomiting, or diarrhea.  You have a bad smelling vaginal discharge.  You have pain when you urinate. Get help right away if:  You have a fever.  You are leaking fluid from your vagina.  You have spotting or bleeding from your vagina.  You have severe abdominal cramping or pain.  You have rapid weight gain or weight loss.  You have shortness of breath with chest pain.  You notice sudden or extreme swelling of your face, hands, ankles, feet, or legs.  You have not felt your baby move in over an hour.  You have severe headaches that do not go away when you take medicine.  You have vision changes. Summary  The second trimester is from week 14 through week 27 (months 4 through 6). It is also a time when the fetus is growing rapidly.  Your body goes through many changes during pregnancy. The changes vary from woman to woman.  Avoid all smoking, herbs, alcohol, and unprescribed drugs. These chemicals affect the formation and growth your baby.  Do not use any tobacco products, such as cigarettes, chewing tobacco, and  e-cigarettes. If you need help quitting, ask your health care provider.  Contact your health care provider if you have any questions. Keep all prenatal visits as told by your health care provider. This is important. This information is not intended to replace advice given to you by your health care provider. Make sure you discuss any questions you have with your health care provider. Document Released: 12/28/2000 Document Revised: 06/11/2015 Document Reviewed: 03/06/2012 Elsevier Interactive Patient Education  2017 [place] FLU! Because you are pregnant, we at [facility], along with the Centers for Disease Control (CDC), recommend that you receive the flu vaccine to protect yourself and your baby from the flu. The flu is more likely to cause severe illness in pregnant women than in women of reproductive age who are not pregnant. Changes in the immune system, heart, and lungs during pregnancy make pregnant women (and women up to two weeks postpartum) more prone to severe illness from flu, including illness resulting in hospitalization. Flu also may be harmful for a pregnant woman's developing baby. A common flu symptom is fever, which may be associated with neural tube defects and other adverse outcomes for a developing baby. Getting vaccinated can also help protect a baby after birth from flu. (Mom passes antibodies onto the developing baby during her pregnancy.)  A Flu Vaccine is the Best Protection Against Flu Getting a flu vaccine is the first and most important step in protecting against flu. Pregnant women should get a flu shot and not the live attenuated influenza vaccine (LAIV), also known as nasal spray flu vaccine. Flu vaccines given during pregnancy help protect both the mother and her baby from flu. Vaccination has been shown to reduce the risk of flu-associated acute respiratory infection in pregnant women by up to one-half. A 2018 study showed  that getting a flu shot reduced a pregnant woman's risk of being hospitalized with flu by an average of 40  percent. Pregnant women who get a flu vaccine are also helping to protect their babies from flu illness for the first several months after their birth, when they are too young to get vaccinated.   A Long Record of Safety for Flu Shots in Pregnant Women Flu shots have been given to millions of pregnant women over many years with a good safety record. There is a lot of evidence that flu vaccines can be given safely during pregnancy; though these data are limited for the first trimester. The CDC recommends that pregnant women get vaccinated during any trimester of their pregnancy. It is very important for pregnant women to get the flu shot.   Other Preventive Actions In addition to getting a flu shot, pregnant women should take the same everyday preventive actions the CDC recommends of everyone, including covering coughs, washing hands often, and avoiding people who are sick.  Symptoms and Treatment If you get sick with flu symptoms call your doctor right away. There are antiviral drugs that can treat flu illness and prevent serious flu complications. The CDC recommends prompt treatment for people who have influenza infection or suspected influenza infection and who are at high risk of serious flu complications, such as people with asthma, diabetes (including gestational diabetes), or heart disease. Early treatment of influenza in hospitalized pregnant women has been shown to reduce the length of the hospital stay.  Symptoms Flu symptoms include fever, cough, sore throat, runny or stuffy nose, body aches, headache, chills and fatigue. Some people may also have vomiting and diarrhea. People may be infected with the flu and have respiratory symptoms without a fever.  Early Treatment is Important for Pregnant Women Treatment should begin as soon as possible because antiviral drugs work best when  started early (within 48 hours after symptoms start). Antiviral drugs can make your flu illness milder and make you feel better faster. They may also prevent serious health problems that can result from flu illness. Oral oseltamivir (Tamiflu) is the preferred treatment for pregnant women because it has the most studies available to suggest that it is safe and beneficial. Antiviral drugs require a prescription from your provider. Having a fever caused by flu infection or other infections early in pregnancy may be linked to birth defects in a baby. In addition to taking antiviral drugs, pregnant women who get a fever should treat their fever with Tylenol (acetaminophen) and contact their provider immediately.  When to [place] If you are pregnant and have any of these signs, seek care immediately:  Difficulty breathing or shortness of breath  Pain or pressure in the chest or abdomen  Sudden dizziness  Confusion  Severe or persistent vomiting  High fever that is not responding to Tylenol (or store brand equivalent)  Decreased or no movement of your baby  [email or web address]

## 2019-02-20 LAB — INTEGRATED 2
AFP MoM: 1.86
Alpha-Fetoprotein: 95.8 ng/mL
Crown Rump Length: 64 mm
DIA MoM: 0.82
DIA Value: 139.5 pg/mL
Estriol, Unconjugated: 2.34 ng/mL
Gest. Age on Collection Date: 12.6 weeks
Gestational Age: 19.6 weeks
Maternal Age at EDD: 26.8 yr
Nuchal Translucency (NT): 1.4 mm
Nuchal Translucency MoM: 0.98
Number of Fetuses: 1
PAPP-A MoM: 1.93
PAPP-A Value: 1544.2 ng/mL
Test Results:: NEGATIVE
Weight: 168 [lb_av]
Weight: 173 [lb_av]
hCG MoM: 1.13
hCG Value: 23.3 IU/mL
uE3 MoM: 1.18

## 2019-02-23 LAB — MATERNIT 21 PLUS CORE, BLOOD
Fetal Fraction: 7
Result (T21): NEGATIVE
Trisomy 13 (Patau syndrome): NEGATIVE
Trisomy 18 (Edwards syndrome): NEGATIVE
Trisomy 21 (Down syndrome): NEGATIVE

## 2019-02-28 ENCOUNTER — Encounter: Payer: Self-pay | Admitting: Advanced Practice Midwife

## 2019-03-18 ENCOUNTER — Other Ambulatory Visit (HOSPITAL_COMMUNITY)
Admission: RE | Admit: 2019-03-18 | Discharge: 2019-03-18 | Disposition: A | Discharge status: Home / Self Care | Payer: 59 | Source: Ambulatory Visit | Attending: Obstetrics & Gynecology | Admitting: Obstetrics & Gynecology

## 2019-03-18 ENCOUNTER — Encounter: Payer: Self-pay | Admitting: Women's Health

## 2019-03-18 ENCOUNTER — Ambulatory Visit (INDEPENDENT_AMBULATORY_CARE_PROVIDER_SITE_OTHER): Payer: 59 | Admitting: Women's Health

## 2019-03-18 ENCOUNTER — Other Ambulatory Visit: Payer: Self-pay

## 2019-03-18 VITALS — BP 114/69 | HR 103 | Wt 170.6 lb

## 2019-03-18 DIAGNOSIS — L292 Pruritus vulvae: Secondary | ICD-10-CM

## 2019-03-18 DIAGNOSIS — N898 Other specified noninflammatory disorders of vagina: Secondary | ICD-10-CM

## 2019-03-18 DIAGNOSIS — K429 Umbilical hernia without obstruction or gangrene: Secondary | ICD-10-CM

## 2019-03-18 DIAGNOSIS — Z3482 Encounter for supervision of other normal pregnancy, second trimester: Secondary | ICD-10-CM

## 2019-03-18 DIAGNOSIS — O26892 Other specified pregnancy related conditions, second trimester: Secondary | ICD-10-CM | POA: Diagnosis not present

## 2019-03-18 DIAGNOSIS — Z331 Pregnant state, incidental: Secondary | ICD-10-CM

## 2019-03-18 DIAGNOSIS — Z1389 Encounter for screening for other disorder: Secondary | ICD-10-CM

## 2019-03-18 LAB — POCT URINALYSIS DIPSTICK OB
Blood, UA: NEGATIVE
Glucose, UA: NEGATIVE
Ketones, UA: NEGATIVE
Leukocytes, UA: NEGATIVE
Nitrite, UA: NEGATIVE
POC,PROTEIN,UA: NEGATIVE

## 2019-03-18 LAB — POCT WET PREP (WET MOUNT)
Clue Cells Wet Prep Whiff POC: NEGATIVE
Trichomonas Wet Prep HPF POC: ABSENT

## 2019-03-18 MED ORDER — NYSTATIN-TRIAMCINOLONE 100000-0.1 UNIT/GM-% EX OINT: 1.0000 "application " | TOPICAL_OINTMENT | Freq: Two times a day (BID) | CUTANEOUS | 0 refills | Status: DC

## 2019-03-18 NOTE — Progress Notes (Signed)
LOW-RISK PREGNANCY VISIT Patient name: Deanna Hughes MRN [id number]  Date of birth: 09-18-1990 Chief Complaint:   Routine Prenatal Visit  History of Present Illness:   Deanna Hughes is a 29 y.o. [id number] female at [redacted]w[redacted]d with an Estimated Date of Delivery: 07/10/19 being seen today for ongoing management of a low-risk pregnancy.  Today she reports swelling hands/feet, esp when working 12hr shifts, wants note to work 8hr shifts. Lower back pain, irregular uc's, vaginal d/c w/ vulvar itching, no odor. Work note given.  Contractions: Irregular. Vag. Bleeding: None.  Movement: Present. denies leaking of fluid. Review of Systems:   Pertinent items are noted in HPI Denies abnormal vaginal discharge w/ itching/odor/irritation, headaches, visual changes, shortness of breath, chest pain, abdominal pain, severe nausea/vomiting, or problems with urination or bowel movements unless otherwise stated above. Pertinent History Reviewed:  Reviewed past medical,surgical, social, obstetrical and family history.  Reviewed problem list, medications and allergies. Physical Assessment:   Vitals:   03/18/19 1019  BP: 114/69  Pulse: (!) 103  Weight: 170 lb 9.6 oz (77.4 kg)  Body mass index is 31.2 kg/m.        Physical Examination:   General appearance: Well appearing, and in no distress  Mental status: Alert, oriented to person, place, and time  Skin: Warm & dry  Cardiovascular: Normal heart rate noted  Respiratory: Normal respiratory effort, no distress  Abdomen: Soft, gravid, nontender  Pelvic: spec exam: cx visually long/closed, scant white nonodorous d/c, no evidence yeast, SVE deferred         Extremities: Edema: Trace  Fetal Status: Fetal Heart Rate (bpm): 150 Fundal Height: 24 cm Movement: Present    Chaperone: [place]    Results for orders placed or performed in visit on 03/18/19 (from the past 24 hour(s))  POC Urinalysis Dipstick OB   Collection Time: 03/18/19 10:18 AM  Result  Value Ref Range   Color, UA     Clarity, UA     Glucose, UA Negative Negative   Bilirubin, UA     Ketones, UA n    Spec Grav, UA     Blood, UA n    pH, UA     POC,PROTEIN,UA Negative Negative, Trace, Small (1+), Moderate (2+), Large (3+), 4+   Urobilinogen, UA     Nitrite, UA n    Leukocytes, UA Negative Negative   Appearance     Odor    POCT Wet Prep [facility] [place])   Collection Time: 03/18/19 11:04 AM  Result Value Ref Range   Source Wet Prep POC vaginal    WBC, Wet Prep HPF POC mod    Bacteria Wet Prep HPF POC None (A) Few   BACTERIA WET PREP MORPHOLOGY POC     Clue Cells Wet Prep HPF POC None None   Clue Cells Wet Prep Whiff POC Negative Whiff    Yeast Wet Prep HPF POC None None   KOH Wet Prep POC     Trichomonas Wet Prep HPF POC Absent Absent    Assessment & Plan:  1) Low-risk pregnancy [id number] at [redacted]w[redacted]d with an Estimated Date of Delivery: 07/10/19   2) Vaginal d/c w/ vulvar itching, wet prep neg, will send gc/ct, trich. Rx mycolog   Meds:  Meds ordered this encounter  Medications  . nystatin-triamcinolone ointment (MYCOLOG)    Sig: Apply 1 application topically 2 (two) times daily.    Dispense:  30 g    Refill:  0    Order  Specific Question:   Supervising Provider    Answer:   Florian Buff [[id number]]   Labs/procedures today: none  Plan:  Continue routine obstetrical care  Next visit: prefers will be in person for pn2    Reviewed: Preterm labor symptoms and general obstetric precautions including but not limited to vaginal bleeding, contractions, leaking of fluid and fetal movement were reviewed in detail with the patient.  All questions were answered. Has home bp cuff.  Check bp weekly, let us know if >140/90.   Follow-up: No follow-ups on file.  Orders Placed This Encounter  Procedures  . POC Urinalysis Dipstick OB  . POCT Wet Prep [facility])   Roma Schanz CNM, [facility] 03/18/2019 11:07 AM

## 2019-03-18 NOTE — Patient Instructions (Signed)
Deanna Hughes, I greatly value your feedback.  If you receive a survey following your visit with [place] today, we appreciate you taking the time to fill it out.  Thanks, [name], CNM, WHNP-BC   You will have your sugar test next visit.  Please do not eat or drink anything after midnight the night before you come, not even water.  You will be here for at least two hours.  Please make an appointment online for the bloodwork at [email or web address] for 8:30am (or as close to this as possible). Make sure you select the [facility] service center. The day of the appointment, check in with our office first, then you will go to [place] to start the sugar test.    [name]!!! It is now [name] at [facility] ([name], [name]) Entrance C, located off of [address] parking  Go to [email or web address] to register for FREE online childbirth classes   Call the office [phone]) or go to [facility] if:  You begin to have strong, frequent contractions  Your water breaks.  Sometimes it is a big gush of fluid, sometimes it is just a trickle that keeps getting your panties wet or running down your legs  You have vaginal bleeding.  It is normal to have a small amount of spotting if your cervix was checked.   You don't feel your baby moving like normal.  If you don't, get you something to eat and drink and lay down and focus on feeling your baby move.   If your baby is still not moving like normal, you should call the office or go to [facility] Pediatricians/Family Doctors:  [place] [phone]                 [place] [phone] (usually not accepting new patients unless you have family there already, you are always welcome to call and ask)       [facility] [phone]       [facility] Pediatricians/Family  Doctors:   [facility]: [phone]  [facility]: [phone]  [facility]: [place] Doctors:   [facility]: [facility]: [place]:  [place]: [phone]   Home Blood Pressure Monitoring for Patients   Your provider has recommended that you check your blood pressure (BP) at least once a week at home. If you do not have a blood pressure cuff at home, one will be provided for you. Contact your provider if you have not received your monitor within 1 week.   Helpful Tips for Accurate Home Blood Pressure Checks  . Don't smoke, exercise, or drink caffeine 30 minutes before checking your BP . Use the restroom before checking your BP (a full bladder can raise your pressure) . Relax in a comfortable upright chair . Feet on the ground . Left arm resting comfortably on a flat surface at the level of your heart . Legs uncrossed . Back supported . Sit quietly and don't talk . Place the cuff on your bare arm . Adjust snuggly, so that only two fingertips can fit between your skin and the top of the cuff . Check 2 readings separated by at least one minute . Keep a log of your  BP readings . For a visual, please reference this diagram: http://ccnc.care/bpdiagram  Provider Name: [facility]     Phone: [phone]  Zone 1: ALL CLEAR  Continue to monitor your symptoms:  . BP reading is less than 140 (top number) or less than 90 (bottom number)  . No right upper stomach pain . No headaches or seeing spots . No feeling nauseated or throwing up . No swelling in face and hands  Zone 2: CAUTION Call your doctor's office for any of the following:  . BP reading is greater than 140 (top number) or greater than 90 (bottom number)  . Stomach pain under your ribs in the middle or right side . Headaches or seeing spots . Feeling  nauseated or throwing up . Swelling in face and hands  Zone 3: EMERGENCY  Seek immediate medical care if you have any of the following:  . BP reading is greater than160 (top number) or greater than 110 (bottom number) . Severe headaches not improving with Tylenol . Serious difficulty catching your breath . Any worsening symptoms from Zone 2   Second Trimester of Pregnancy The second trimester is from week 13 through week 28, months 4 through 6. The second trimester is often a time when you feel your best. Your body has also adjusted to being pregnant, and you begin to feel better physically. Usually, morning sickness has lessened or quit completely, you may have more energy, and you may have an increase in appetite. The second trimester is also a time when the fetus is growing rapidly. At the end of the sixth month, the fetus is about 9 inches long and weighs about 1 pounds. You will likely begin to feel the baby move (quickening) between 18 and 20 weeks of the pregnancy. BODY CHANGES Your body goes through many changes during pregnancy. The changes vary from woman to woman.   Your weight will continue to increase. You will notice your lower abdomen bulging out.  You may begin to get stretch marks on your hips, abdomen, and breasts.  You may develop headaches that can be relieved by medicines approved by your health care provider.  You may urinate more often because the fetus is pressing on your bladder.  You may develop or continue to have heartburn as a result of your pregnancy.  You may develop constipation because certain hormones are causing the muscles that push waste through your intestines to slow down.  You may develop hemorrhoids or swollen, bulging veins (varicose veins).  You may have back pain because of the weight gain and pregnancy hormones relaxing your joints between the bones in your pelvis and as a result of a shift in weight and the muscles that support your  balance.  Your breasts will continue to grow and be tender.  Your gums may bleed and may be sensitive to brushing and flossing.  Dark spots or blotches (chloasma, mask of pregnancy) may develop on your face. This will likely fade after the baby is born.  A dark line from your belly button to the pubic area (linea nigra) may appear. This will likely fade after the baby is born.  You may have changes in your hair. These can include thickening of your hair, rapid growth, and changes in texture. Some women also have hair loss during or after pregnancy, or hair that feels dry or thin. Your hair will most likely return to normal after your baby is born. WHAT TO EXPECT AT YOUR PRENATAL VISITS  During a routine prenatal visit:  You will be weighed to make sure you and the fetus are growing normally.  Your blood pressure will be taken.  Your abdomen will be measured to track your baby's growth.  The fetal heartbeat will be listened to.  Any test results from the previous visit will be discussed. Your health care provider may ask you:  How you are feeling.  If you are feeling the baby move.  If you have had any abnormal symptoms, such as leaking fluid, bleeding, severe headaches, or abdominal cramping.  If you have any questions. Other tests that may be performed during your second trimester include:  Blood tests that check for:  Low iron levels (anemia).  Gestational diabetes (between 24 and 28 weeks).  Rh antibodies.  Urine tests to check for infections, diabetes, or protein in the urine.  An ultrasound to confirm the proper growth and development of the baby.  An amniocentesis to check for possible genetic problems.  Fetal screens for spina bifida and Down syndrome. HOME CARE INSTRUCTIONS   Avoid all smoking, herbs, alcohol, and unprescribed drugs. These chemicals affect the formation and growth of the baby.  Follow your health care provider's instructions regarding  medicine use. There are medicines that are either safe or unsafe to take during pregnancy.  Exercise only as directed by your health care provider. Experiencing uterine cramps is a good sign to stop exercising.  Continue to eat regular, healthy meals.  Wear a good support bra for breast tenderness.  Do not use hot tubs, steam rooms, or saunas.  Wear your seat belt at all times when driving.  Avoid raw meat, uncooked cheese, cat litter boxes, and soil used by cats. These carry germs that can cause birth defects in the baby.  Take your prenatal vitamins.  Try taking a stool softener (if your health care provider approves) if you develop constipation. Eat more high-fiber foods, such as fresh vegetables or fruit and whole grains. Drink plenty of fluids to keep your urine clear or pale yellow.  Take warm sitz baths to soothe any pain or discomfort caused by hemorrhoids. Use hemorrhoid cream if your health care provider approves.  If you develop varicose veins, wear support hose. Elevate your feet for 15 minutes, 3-4 times a day. Limit salt in your diet.  Avoid heavy lifting, wear low heel shoes, and practice good posture.  Rest with your legs elevated if you have leg cramps or low back pain.  Visit your dentist if you have not gone yet during your pregnancy. Use a soft toothbrush to brush your teeth and be gentle when you floss.  A sexual relationship may be continued unless your health care provider directs you otherwise.  Continue to go to all your prenatal visits as directed by your health care provider. SEEK MEDICAL CARE IF:   You have dizziness.  You have mild pelvic cramps, pelvic pressure, or nagging pain in the abdominal area.  You have persistent nausea, vomiting, or diarrhea.  You have a bad smelling vaginal discharge.  You have pain with urination. SEEK IMMEDIATE MEDICAL CARE IF:   You have a fever.  You are leaking fluid from your vagina.  You have spotting or  bleeding from your vagina.  You have severe abdominal cramping or pain.  You have rapid weight gain or loss.  You have shortness of breath with chest pain.  You notice sudden or extreme swelling of your face, hands, ankles, feet, or legs.  You have not felt your baby move in over an hour.  You have severe headaches that do not go away with medicine.  You have vision changes. Document Released: 12/28/2000 Document Revised: 01/08/2013 Document Reviewed: 03/06/2012 [facility] Patient Information 2015 [place], [place]. This information is not intended to replace advice given to you by your health care provider. Make sure you discuss any questions you have with your health care provider.  PROTECT YOURSELF & YOUR BABY FROM THE FLU! Because you are pregnant, we at [facility], along with the Centers for Disease Control (CDC), recommend that you receive the flu vaccine to protect yourself and your baby from the flu. The flu is more likely to cause severe illness in pregnant women than in women of reproductive age who are not pregnant. Changes in the immune system, heart, and lungs during pregnancy make pregnant women (and women up to two weeks postpartum) more prone to severe illness from flu, including illness resulting in hospitalization. Flu also may be harmful for a pregnant woman's developing baby. A common flu symptom is fever, which may be associated with neural tube defects and other adverse outcomes for a developing baby. Getting vaccinated can also help protect a baby after birth from flu. (Mom passes antibodies onto the developing baby during her pregnancy.)  A Flu Vaccine is the Best Protection Against Flu Getting a flu vaccine is the first and most important step in protecting against flu. Pregnant women should get a flu shot and not the live attenuated influenza vaccine (LAIV), also known as nasal spray flu vaccine. Flu vaccines given during pregnancy help protect both the mother and her  baby from flu. Vaccination has been shown to reduce the risk of flu-associated acute respiratory infection in pregnant women by up to one-half. A 2018 study showed that getting a flu shot reduced a pregnant woman's risk of being hospitalized with flu by an average of 40 percent. Pregnant women who get a flu vaccine are also helping to protect their babies from flu illness for the first several months after their birth, when they are too young to get vaccinated.   A Long Record of Safety for Flu Shots in Pregnant Women Flu shots have been given to millions of pregnant women over many years with a good safety record. There is a lot of evidence that flu vaccines can be given safely during pregnancy; though these data are limited for the first trimester. The CDC recommends that pregnant women get vaccinated during any trimester of their pregnancy. It is very important for pregnant women to get the flu shot.   Other Preventive Actions In addition to getting a flu shot, pregnant women should take the same everyday preventive actions the CDC recommends of everyone, including covering coughs, washing hands often, and avoiding people who are sick.  Symptoms and Treatment If you get sick with flu symptoms call your doctor right away. There are antiviral drugs that can treat flu illness and prevent serious flu complications. The CDC recommends prompt treatment for people who have influenza infection or suspected influenza infection and who are at high risk of serious flu complications, such as people with asthma, diabetes (including gestational diabetes), or heart disease. Early treatment of influenza in hospitalized pregnant women has been shown to reduce the length of the hospital stay.  Symptoms Flu symptoms include fever, cough, sore throat, runny or stuffy nose, body aches, headache, chills and fatigue. Some people may also have vomiting and diarrhea. People may be infected with  the flu and have respiratory  symptoms without a fever.  Early Treatment is Important for Pregnant Women Treatment should begin as soon as possible because antiviral drugs work best when started early (within 48 hours after symptoms start). Antiviral drugs can make your flu illness milder and make you feel better faster. They may also prevent serious health problems that can result from flu illness. Oral oseltamivir (Tamiflu) is the preferred treatment for pregnant women because it has the most studies available to suggest that it is safe and beneficial. Antiviral drugs require a prescription from your provider. Having a fever caused by flu infection or other infections early in pregnancy may be linked to birth defects in a baby. In addition to taking antiviral drugs, pregnant women who get a fever should treat their fever with Tylenol (acetaminophen) and contact their provider immediately.  When to [place] If you are pregnant and have any of these signs, seek care immediately:  Difficulty breathing or shortness of breath  Pain or pressure in the chest or abdomen  Sudden dizziness  Confusion  Severe or persistent vomiting  High fever that is not responding to Tylenol (or store brand equivalent)  Decreased or no movement of your baby  [email or web address]

## 2019-03-19 LAB — CERVICOVAGINAL ANCILLARY ONLY
Chlamydia: NEGATIVE
Comment: NEGATIVE
Comment: NEGATIVE
Comment: NORMAL
Neisseria Gonorrhea: NEGATIVE
Trichomonas: NEGATIVE

## 2019-03-20 ENCOUNTER — Encounter: Payer: Self-pay | Admitting: Gastroenterology

## 2019-03-21 ENCOUNTER — Encounter: Payer: 59 | Admitting: Family Medicine

## 2019-04-09 ENCOUNTER — Other Ambulatory Visit: Payer: Self-pay

## 2019-04-09 ENCOUNTER — Inpatient Hospital Stay (HOSPITAL_COMMUNITY)
Admission: EM | Admit: 2019-04-09 | Discharge: 2019-04-09 | Disposition: A | Discharge status: Home / Self Care | Payer: 59 | Attending: Obstetrics and Gynecology | Admitting: Obstetrics and Gynecology

## 2019-04-09 ENCOUNTER — Encounter (HOSPITAL_COMMUNITY): Payer: Self-pay | Admitting: Obstetrics and Gynecology

## 2019-04-09 DIAGNOSIS — R8290 Unspecified abnormal findings in urine: Secondary | ICD-10-CM | POA: Insufficient documentation

## 2019-04-09 DIAGNOSIS — Z886 Allergy status to analgesic agent status: Secondary | ICD-10-CM | POA: Insufficient documentation

## 2019-04-09 DIAGNOSIS — Z3A26 26 weeks gestation of pregnancy: Secondary | ICD-10-CM | POA: Insufficient documentation

## 2019-04-09 DIAGNOSIS — R1030 Lower abdominal pain, unspecified: Secondary | ICD-10-CM | POA: Insufficient documentation

## 2019-04-09 DIAGNOSIS — O26892 Other specified pregnancy related conditions, second trimester: Secondary | ICD-10-CM | POA: Insufficient documentation

## 2019-04-09 DIAGNOSIS — R102 Pelvic and perineal pain: Secondary | ICD-10-CM

## 2019-04-09 LAB — URINALYSIS, ROUTINE W REFLEX MICROSCOPIC
Bilirubin Urine: NEGATIVE
Glucose, UA: NEGATIVE mg/dL
Hgb urine dipstick: NEGATIVE
Ketones, ur: 20 mg/dL — AB
Nitrite: NEGATIVE
Protein, ur: 30 mg/dL — AB
Specific Gravity, Urine: 1.026 (ref 1.005–1.030)
pH: 6 (ref 5.0–8.0)

## 2019-04-09 LAB — WET PREP, GENITAL
Clue Cells Wet Prep HPF POC: NONE SEEN
Sperm: NONE SEEN
Trich, Wet Prep: NONE SEEN
Yeast Wet Prep HPF POC: NONE SEEN

## 2019-04-09 MED ORDER — MISC. DEVICES MISC: 0 refills | Status: DC

## 2019-04-09 MED ORDER — LACTATED RINGERS IV BOLUS
1000.0000 mL | Freq: Once | INTRAVENOUS | Status: AC
  Administered 2019-04-09: 1000 mL via INTRAVENOUS

## 2019-04-09 NOTE — MAU Note (Signed)
Pt was sent from [facility] ED for abdominal pain and pressure and a "weird feeling" in her stomach. Denies VB or LOF. Decreased fetal movement.

## 2019-04-09 NOTE — ED Provider Notes (Signed)
MSE was initiated and I personally evaluated the patient and placed orders (if any) at  10:48 AM on April 09, 2019.  Patient is a 29 year old female who is [redacted] weeks pregnant and presents to the ED for an evaluation of abdominal pain and pressure.  Denies vaginal bleeding and vaginal fluid, but admits to increased vaginal discharge.  Notes she is still able to feel baby move but less than normal.  Patient has [facility] [place]  She is a G6 P46 female.  Tenderness palpation throughout lower abdomen.  Spoke to Sam at MAU who agrees to transfer of patient to [facility] for further evaluation.  Patient stable for transfer at this time.  The patient appears stable so that the remainder of the MSE may be completed by another provider.   Suzy Bouchard, PA-C 04/09/19 1051    Little, Wenda Overland, MD 04/09/19 1118

## 2019-04-09 NOTE — MAU Provider Note (Signed)
History     CSN: [id number]  Arrival date and time: 04/09/19 1021   First Provider Initiated Contact with Patient 04/09/19 1145      Chief Complaint  Patient presents with  . Abdominal Pain   HPI Deanna Hughes is a 29 y.o. [id number] at [redacted]w[redacted]d who presents to [facility]U from [facility] for evaluation of lower abdominal "pressure". This is an ongoing complaint, unchanged since onset a couple weeks ago. Patient states she feels "weird". She rates the pressure as 7/10. She denies aggravating or alleviating factors. She has not taken medication or tried other treatments for this complaint. She denies vaginal bleeding, leaking of fluid, decreased fetal movement, fever, falls, or recent illness. She denies sexual intercourse this pregnancy.  She receives care with [facility].  OB History    Gravida  6   Para  3   Term  3   Preterm  0   AB  2   Living  3     SAB  2   TAB  0   Ectopic  0   Multiple  0   Live Births  3        Obstetric Comments  1st Menstrual Cycle:  15  1st Pregnancy:  18         Past Medical History:  Diagnosis Date  . Anxiety   . BV (bacterial vaginosis) 04/02/2015  . Chlamydia    treated 6/6  . Chronic back pain   . Chronic headaches    MIGRAINES  . Contraceptive management 01/25/2013  . Encounter for Nexplanon removal 02/27/2015  . Irregular bleeding 06/03/2013  . Miscarriage 03/27/2013  . Nexplanon insertion 04/11/2013   nexplanon inserted 04/11/13 remove 3/32/18  . Right sided abdominal pain   . Scoliosis   . Seasonal allergies 07/03/2012  . Sinus infection 08/27/2013  . Supervision of normal pregnancy 08/27/2015    [facility] Initiated Care at   7+5 weeks  FOB  Dating By  LMP  Pap  GC/CT Initial:                36+wks: Genetic Screen NT/IT:  CF screen  Anatomic [place]  Flu vaccine  Tdap Recommended ~ 28wks Glucose Screen  2 hr GBS  Feed Preference  Contraception  Circumcision  Childbirth Classes  Pediatrician    . Threatened abortion in early  pregnancy 03/20/2013  . Vaginal discharge 10/03/2012  . Yeast infection 07/03/2012    Past Surgical History:  Procedure Laterality Date  . BIOPSY  09/18/2018   Procedure: BIOPSY;  Surgeon: Danie Binder, MD;  [facility];  Service: Endoscopy;;  . COLONOSCOPY WITH PROPOFOL N/A 09/18/2018   Procedure: COLONOSCOPY WITH PROPOFOL;  Surgeon: Danie Binder, MD;  [facility];  Service: Endoscopy;  Laterality: N/A;  8:30am  . FLEXIBLE SIGMOIDOSCOPY N/A 10/05/2018   Procedure: FLEXIBLE SIGMOIDOSCOPY;  Surgeon: Danie Binder, MD;  Location: [facility];  Service: Endoscopy;  Laterality: N/A;  8:30  . HEMORRHOID BANDING N/A 10/05/2018   Procedure: Thayer Jew;  Surgeon: Danie Binder, MD;  [facility];  Service: Endoscopy;  Laterality: N/A;  . HERNIA REPAIR    . INGUINAL HERNIA REPAIR Right 04/19/2017   Procedure: HERNIA REPAIR INGUINAL ADULT;  Surgeon: Robert Bellow, MD;  Location: [facility] ORS;  Service: General;  Laterality: Right;  . WISDOM TOOTH EXTRACTION      Family History  Problem Relation Age of Onset  . Asthma  Mother   . Asthma Sister   . Asthma Brother   . Asthma Daughter   . Diabetes Maternal Grandmother   . Hypertension Maternal Grandmother   . Asthma Maternal Grandmother   . Heart disease Maternal Grandmother   . Stroke Maternal Grandmother   . Hypertension Maternal Grandfather   . Asthma Maternal Grandfather   . Heart disease Maternal Grandfather   . Asthma Son   . Colon cancer Neg Hx   . Colon polyps Neg Hx     Social History   Tobacco Use  . Smoking status: Never Smoker  . Smokeless tobacco: Never Used  Substance Use Topics  . Alcohol use: No  . Drug use: No    Allergies:  Allergies  Allergen Reactions  . Aspirin Shortness Of Breath and Other (See Comments)    Heart beats fast  . Advil [Ibuprofen] Other (See Comments)    Breakout, heart palpitations and trouble breathing.   . Latex Rash    Medications Prior to  Admission  Medication Sig Dispense Refill Last Dose  . prenatal vitamin w/FE, FA (PRENATAL 1 + 1) 27-1 MG TABS tablet Take 1 tablet by mouth daily at 12 noon. 30 tablet 11 04/08/2019 at Unknown time  . nystatin-triamcinolone ointment (MYCOLOG) Apply 1 application topically 2 (two) times daily. 30 g 0   . ondansetron (ZOFRAN ODT) 4 MG disintegrating tablet Take 1 tablet (4 mg total) by mouth every 6 (six) hours as needed for nausea. 30 tablet 2     Review of Systems  Gastrointestinal: Positive for abdominal pain.  Genitourinary: Positive for vaginal discharge. Negative for vaginal bleeding.  All other systems reviewed and are negative.  Physical Exam   Blood pressure 100/61, pulse 95, temperature 98.3 F (36.8 C), temperature source Oral, resp. rate 18, height 5\' 2"  (1.575 m), weight 78.3 kg, last menstrual period 09/23/2018, SpO2 100 %.  Physical Exam  Nursing note and vitals reviewed. Constitutional: She is oriented to person, place, and time. She appears well-developed and well-nourished.  Cardiovascular: Normal rate and normal heart sounds.  Respiratory: Effort normal and breath sounds normal.  GI: Soft. She exhibits no distension. There is no abdominal tenderness. There is no rebound and no guarding.  Gravid  Genitourinary:    Uterus normal.     Vaginal discharge present.     Genitourinary Comments: Pelvic exam: External genitalia normal, vaginal walls pink and well rugated, cervix visually closed, no lesions noted. Moderate amount of thin white discharge throughout vault    Neurological: She is alert and oriented to person, place, and time.  Skin: Skin is warm and dry.  Psychiatric: She has a normal mood and affect. Her behavior is normal. Judgment and thought content normal.    MAU Course/MDM  Procedures  --Reassuring fetal surveillance: baseline 130, + accels, no decels --Toco: occasional UI, no contractions --OB history term SVD x 3  Patient Vitals for the past 24  hrs:  BP Temp Temp src Pulse Resp SpO2 Height Weight  04/09/19 1308 (!) 98/50 - - 86 16 100 % - -  04/09/19 1144 100/61 - - 95 - - - -  04/09/19 1118 (!) 102/53 98.3 F (36.8 C) Oral 89 18 100 % 5\' 2"  (1.575 m) 78.3 kg  04/09/19 1023 109/70 98.3 F (36.8 C) Oral 90 16 100 % 5\' 2"  (1.575 m) 76.7 kg   Results for orders placed or performed during the hospital encounter of 04/09/19 (from the past 24 hour(s))  Urinalysis, Routine  w reflex microscopic     Status: Abnormal   Collection Time: 04/09/19 11:42 AM  Result Value Ref Range   Color, Urine AMBER (A) YELLOW   APPearance HAZY (A) CLEAR   Specific Gravity, Urine 1.026 1.005 - 1.030   pH 6.0 5.0 - 8.0   Glucose, UA NEGATIVE NEGATIVE mg/dL   Hgb urine dipstick NEGATIVE NEGATIVE   Bilirubin Urine NEGATIVE NEGATIVE   Ketones, ur 20 (A) NEGATIVE mg/dL   Protein, ur 30 (A) NEGATIVE mg/dL   Nitrite NEGATIVE NEGATIVE   Leukocytes,Ua MODERATE (A) NEGATIVE   RBC / HPF 0-5 0 - 5 RBC/hpf   WBC, UA 6-10 0 - 5 WBC/hpf   Bacteria, UA RARE (A) NONE SEEN   Squamous Epithelial / LPF 0-5 0 - 5   Mucus PRESENT   Wet prep, genital     Status: Abnormal   Collection Time: 04/09/19 11:55 AM   Specimen: Vaginal  Result Value Ref Range   Yeast Wet Prep HPF POC NONE SEEN NONE SEEN   Trich, Wet Prep NONE SEEN NONE SEEN   Clue Cells Wet Prep HPF POC NONE SEEN NONE SEEN   WBC, Wet Prep HPF POC MANY (A) NONE SEEN   Sperm NONE SEEN    Meds ordered this encounter  Medications  . lactated ringers bolus 1,000 mL  . Misc. Devices MISC    Sig: Dispense one maternity belt for patient    Dispense:  1 each    Refill:  0    Order Specific Question:   Supervising Provider    Answer:   CONSTANT, PEGGY [[id number]]    Assessment and Plan  --29 y.o. [id number] at [redacted]w[redacted]d  --Reactive tracing --Abnormal UA, urine culture ordered --New rx maternity belt --Discharge home in stable condition   F/U: --Next appt [facility] 04/15/2019  Darlina Rumpf,  [place] 04/09/2019, 2:02 PM

## 2019-04-09 NOTE — Discharge Instructions (Signed)
PREGNANCY SUPPORT BELT: You are not alone, Seventy-five percent of women have some sort of abdominal or back pain at some point in their pregnancy. Your baby is growing at a fast pace, which means that your whole body is rapidly trying to adjust to the changes. As your uterus grows, your back may start feeling a bit under stress and this can result in back or abdominal pain that can go from mild, and therefore bearable, to severe pains that will not allow you to sit or lay down comfortably, When it comes to dealing with pregnancy-related pains and cramps, some pregnant women usually prefer natural remedies, which the market is filled with nowadays. For example, wearing a pregnancy support belt can help ease and lessen your discomfort and pain. WHAT ARE THE BENEFITS OF WEARING A PREGNANCY SUPPORT BELT? A pregnancy support belt provides support to the lower portion of the belly taking some of the weight of the growing uterus and distributing to the other parts of your body. It is designed make you comfortable and gives you extra support. Over the years, the pregnancy apparel market has been studying the needs and wants of pregnant women and they have come up with the most comfortable pregnancy support belts that woman could ever ask for. In fact, you will no longer have to wear a stretched-out or bulky pregnancy belt that is visible underneath your clothes and makes you feel even more uncomfortable. Nowadays, a pregnancy support belt is made of comfortable and stretchy materials that will not irritate your skin but will actually make you feel at ease and you will not even notice you are wearing it. They are easy to put on and adjust during the day and can be worn at night for additional support.  BENEFITS: . Relives Back pain . Relieves Abdominal Muscle and Leg Pain . Stabilizes the Pelvic Ring . Offers a Cushioned Abdominal Lift Pad . Relieves pressure on the Sciatic Nerve Within Minutes WHERE TO GET  YOUR PREGNANCY BELT: [employer] [phone] [phone]  [place], [place] [address]    Third Trimester of Pregnancy  The third trimester is from week 28 through week 40 (months 7 through 9). This trimester is when your unborn baby (fetus) is growing very fast. At the end of the ninth month, the unborn baby is about 20 inches in length. It weighs about 6-10 pounds. Follow these instructions at home: Medicines  Take over-the-counter and prescription medicines only as told by your doctor. Some medicines are safe and some medicines are not safe during pregnancy.  Take a prenatal vitamin that contains at least 600 micrograms (mcg) of folic acid.  If you have trouble pooping (constipation), take medicine that will make your stool soft (stool softener) if your doctor approves. Eating and drinking   Eat regular, healthy meals.  Avoid raw meat and uncooked cheese.  If you get low calcium from the food you eat, talk to your doctor about taking a daily calcium supplement.  Eat four or five small meals rather than three large meals a day.  Avoid foods that are high in fat and sugars, such as fried and sweet foods.  To prevent constipation: ? Eat foods that are high in fiber, like fresh fruits and vegetables, whole grains, and beans. ? Drink enough fluids to keep your pee (urine) clear or pale yellow. Activity  Exercise only as told by your doctor. Stop exercising if you start to have cramps.  Avoid heavy lifting,  wear low heels, and sit up straight.  Do not exercise if it is too hot, too humid, or if you are in a place of great height (high altitude).  You may continue to have sex unless your doctor tells you not to. Relieving pain and discomfort  Wear a good support bra if your breasts are tender.  Take frequent breaks and rest with your legs raised if you have leg cramps or low back pain.  Take warm water baths (sitz baths) to soothe pain or discomfort  caused by hemorrhoids. Use hemorrhoid cream if your doctor approves.  If you develop puffy, bulging veins (varicose veins) in your legs: ? Wear support hose or compression stockings as told by your doctor. ? Raise (elevate) your feet for 15 minutes, 3-4 times a day. ? Limit salt in your food. Safety  Wear your seat belt when driving.  Make a list of emergency phone numbers, including numbers for family, friends, the hospital, and police and fire departments. Preparing for your baby's arrival To prepare for the arrival of your baby:  Take prenatal classes.  Practice driving to the hospital.  Visit the hospital and tour the maternity area.  Talk to your work about taking leave once the baby comes.  Pack your hospital bag.  Prepare the baby's room.  Go to your doctor visits.  Buy a rear-facing car seat. Learn how to install it in your car. General instructions  Do not use hot tubs, steam rooms, or saunas.  Do not use any products that contain nicotine or tobacco, such as cigarettes and e-cigarettes. If you need help quitting, ask your doctor.  Do not drink alcohol.  Do not douche or use tampons or scented sanitary pads.  Do not cross your legs for long periods of time.  Do not travel for long distances unless you must. Only do so if your doctor says it is okay.  Visit your dentist if you have not gone during your pregnancy. Use a soft toothbrush to brush your teeth. Be gentle when you floss.  Avoid cat litter boxes and soil used by cats. These carry germs that can cause birth defects in the baby and can cause a loss of your baby (miscarriage) or stillbirth.  Keep all your prenatal visits as told by your doctor. This is important. Contact a doctor if:  You are not sure if you are in labor or if your water has broken.  You are dizzy.  You have mild cramps or pressure in your lower belly.  You have a nagging pain in your belly area.  You continue to feel sick to  your stomach, you throw up, or you have watery poop.  You have bad smelling fluid coming from your vagina.  You have pain when you pee. Get help right away if:  You have a fever.  You are leaking fluid from your vagina.  You are spotting or bleeding from your vagina.  You have severe belly cramps or pain.  You lose or gain weight quickly.  You have trouble catching your breath and have chest pain.  You notice sudden or extreme puffiness (swelling) of your face, hands, ankles, feet, or legs.  You have not felt the baby move in over an hour.  You have severe headaches that do not go away with medicine.  You have trouble seeing.  You are leaking, or you are having a gush of fluid, from your vagina before you are 37 weeks.  You have  regular belly spasms (contractions) before you are 37 weeks. Summary  The third trimester is from week 28 through week 40 (months 7 through 9). This time is when your unborn baby is growing very fast.  Follow your doctor's advice about medicine, food, and activity.  Get ready for the arrival of your baby by taking prenatal classes, getting all the baby items ready, preparing the baby's room, and visiting your doctor to be checked.  Get help right away if you are bleeding from your vagina, or you have chest pain and trouble catching your breath, or if you have not felt your baby move in over an hour. This information is not intended to replace advice given to you by your health care provider. Make sure you discuss any questions you have with your health care provider. Document Revised: 04/26/2018 Document Reviewed: 02/09/2016 Elsevier Patient Education  [place].

## 2019-04-10 LAB — GC/CHLAMYDIA PROBE AMP (~~LOC~~) NOT AT ARMC
Chlamydia: NEGATIVE
Comment: NEGATIVE
Comment: NORMAL
Neisseria Gonorrhea: NEGATIVE

## 2019-04-10 LAB — CULTURE, OB URINE: Culture: NO GROWTH

## 2019-04-15 ENCOUNTER — Other Ambulatory Visit: Payer: 59

## 2019-04-15 ENCOUNTER — Other Ambulatory Visit: Payer: Self-pay

## 2019-04-15 ENCOUNTER — Encounter: Payer: Self-pay | Admitting: Obstetrics & Gynecology

## 2019-04-15 ENCOUNTER — Ambulatory Visit (INDEPENDENT_AMBULATORY_CARE_PROVIDER_SITE_OTHER): Payer: 59 | Admitting: Obstetrics & Gynecology

## 2019-04-15 VITALS — BP 103/67 | HR 91 | Wt 170.0 lb

## 2019-04-15 DIAGNOSIS — Z3A27 27 weeks gestation of pregnancy: Secondary | ICD-10-CM

## 2019-04-15 DIAGNOSIS — Z3482 Encounter for supervision of other normal pregnancy, second trimester: Secondary | ICD-10-CM | POA: Diagnosis not present

## 2019-04-15 DIAGNOSIS — Z1389 Encounter for screening for other disorder: Secondary | ICD-10-CM

## 2019-04-15 DIAGNOSIS — Z131 Encounter for screening for diabetes mellitus: Secondary | ICD-10-CM | POA: Diagnosis not present

## 2019-04-15 DIAGNOSIS — Z331 Pregnant state, incidental: Secondary | ICD-10-CM

## 2019-04-15 LAB — POCT URINALYSIS DIPSTICK OB
Blood, UA: NEGATIVE
Glucose, UA: NEGATIVE
Ketones, UA: NEGATIVE
Leukocytes, UA: NEGATIVE
Nitrite, UA: NEGATIVE

## 2019-04-15 NOTE — Progress Notes (Signed)
   LOW-RISK PREGNANCY VISIT Patient name: Deanna Hughes MRN [id number]  Date of birth: [birth year] Chief Complaint:   Routine Prenatal Visit (pressure)  History of Present Illness:   Deanna Hughes is a 29 y.o. [id number] female at [redacted]w[redacted]d with an Estimated Date of Delivery: 07/10/19 being seen today for ongoing management of a low-risk pregnancy.  Today she reports pelvic pressure . Contractions: Irregular.  .  Movement: Present. denies leaking of fluid. Review of Systems:   Pertinent items are noted in HPI Denies abnormal vaginal discharge w/ itching/odor/irritation, headaches, visual changes, shortness of breath, chest pain, abdominal pain, severe nausea/vomiting, or problems with urination or bowel movements unless otherwise stated above. Pertinent History Reviewed:  Reviewed past medical,surgical, social, obstetrical and family history.  Reviewed problem list, medications and allergies. Physical Assessment:   Vitals:   04/15/19 0932  BP: 103/67  Pulse: 91  Weight: 170 lb (77.1 kg)  Body mass index is 31.09 kg/m.        Physical Examination:   General appearance: Well appearing, and in no distress  Mental status: Alert, oriented to person, place, and time  Skin: Warm & dry  Cardiovascular: Normal heart rate noted  Respiratory: Normal respiratory effort, no distress  Abdomen: Soft, gravid, nontender  Pelvic: Cervical exam deferred         Extremities: Edema: Trace  Fetal Status: Fetal Heart Rate (bpm): 140 Fundal Height: 27 cm Movement: Present    Chaperone: n/a    Results for orders placed or performed in visit on 04/15/19 (from the past 24 hour(s))  POC Urinalysis Dipstick OB   Collection Time: 04/15/19  9:34 AM  Result Value Ref Range   Color, UA     Clarity, UA     Glucose, UA Negative Negative   Bilirubin, UA     Ketones, UA neg    Spec Grav, UA     Blood, UA neg    pH, UA     POC,PROTEIN,UA Trace Negative, Trace, Small (1+), Moderate (2+), Large (3+), 4+   Urobilinogen, UA     Nitrite, UA neg    Leukocytes, UA Negative Negative   Appearance     Odor      Assessment & Plan:  1) Low-risk pregnancy [id number] at [redacted]w[redacted]d with an Estimated Date of Delivery: 07/10/19      Meds: No orders of the defined types were placed in this encounter.  Labs/procedures today:   Plan:  Continue routine obstetrical care  Next visit: prefers in person    Reviewed: Preterm labor symptoms and general obstetric precautions including but not limited to vaginal bleeding, contractions, leaking of fluid and fetal movement were reviewed in detail with the patient.  All questions were answered. Has home bp cuff. Rx faxed to . Check bp weekly, let us know if >140/90.   Follow-up: Return in about 3 weeks (around 05/06/2019) for [place].  Orders Placed This Encounter  Procedures  . POC Urinalysis Dipstick OB   Mertie Clause Nezzie Manera  04/15/2019 10:09 AM

## 2019-04-16 LAB — CBC
Hematocrit: 34.8 % (ref 34.0–46.6)
Hemoglobin: 11.9 g/dL (ref 11.1–15.9)
MCH: 30.3 pg (ref 26.6–33.0)
MCHC: 34.2 g/dL (ref 31.5–35.7)
MCV: 89 fL (ref 79–97)
Platelets: 183 10*3/uL (ref 150–450)
RBC: 3.93 x10E6/uL (ref 3.77–5.28)
RDW: 13 % (ref 11.7–15.4)
WBC: 7 10*3/uL (ref 3.4–10.8)

## 2019-04-16 LAB — HIV ANTIBODY (ROUTINE TESTING W REFLEX): HIV Screen 4th Generation wRfx: NONREACTIVE

## 2019-04-16 LAB — RPR: RPR Ser Ql: NONREACTIVE

## 2019-04-16 LAB — GLUCOSE TOLERANCE, 2 HOURS W/ 1HR
Glucose, 1 hour: 119 mg/dL (ref 65–179)
Glucose, 2 hour: 94 mg/dL (ref 65–152)
Glucose, Fasting: 84 mg/dL (ref 65–91)

## 2019-04-16 LAB — ANTIBODY SCREEN: Antibody Screen: NEGATIVE

## 2019-04-18 ENCOUNTER — Encounter: Payer: 59 | Admitting: Family Medicine

## 2019-05-06 ENCOUNTER — Other Ambulatory Visit: Payer: Self-pay

## 2019-05-06 ENCOUNTER — Ambulatory Visit (INDEPENDENT_AMBULATORY_CARE_PROVIDER_SITE_OTHER): Payer: 59 | Admitting: Women's Health

## 2019-05-06 ENCOUNTER — Encounter: Payer: Self-pay | Admitting: Women's Health

## 2019-05-06 VITALS — BP 111/72 | HR 113 | Wt 171.0 lb

## 2019-05-06 DIAGNOSIS — O26893 Other specified pregnancy related conditions, third trimester: Secondary | ICD-10-CM

## 2019-05-06 DIAGNOSIS — Z23 Encounter for immunization: Secondary | ICD-10-CM

## 2019-05-06 DIAGNOSIS — R102 Pelvic and perineal pain: Secondary | ICD-10-CM

## 2019-05-06 DIAGNOSIS — N898 Other specified noninflammatory disorders of vagina: Secondary | ICD-10-CM

## 2019-05-06 DIAGNOSIS — Z3A30 30 weeks gestation of pregnancy: Secondary | ICD-10-CM

## 2019-05-06 DIAGNOSIS — Z331 Pregnant state, incidental: Secondary | ICD-10-CM

## 2019-05-06 DIAGNOSIS — Z113 Encounter for screening for infections with a predominantly sexual mode of transmission: Secondary | ICD-10-CM

## 2019-05-06 DIAGNOSIS — Z3483 Encounter for supervision of other normal pregnancy, third trimester: Secondary | ICD-10-CM | POA: Diagnosis not present

## 2019-05-06 DIAGNOSIS — Z1389 Encounter for screening for other disorder: Secondary | ICD-10-CM

## 2019-05-06 LAB — POCT URINALYSIS DIPSTICK OB
Blood, UA: NEGATIVE
Glucose, UA: NEGATIVE
Ketones, UA: NEGATIVE
Leukocytes, UA: NEGATIVE
Nitrite, UA: NEGATIVE
POC,PROTEIN,UA: NEGATIVE

## 2019-05-06 LAB — POCT WET PREP (WET MOUNT)
Clue Cells Wet Prep Whiff POC: NEGATIVE
Trichomonas Wet Prep HPF POC: ABSENT

## 2019-05-06 NOTE — Patient Instructions (Addendum)
Deanna Hughes, I greatly value your feedback.  If you receive a survey following your visit with [place] today, we appreciate you taking the time to fill it out.  Thanks, [name], CNM, WHNP-BC   [facility] [place] at [facility] ([place], [place] [address]) Entrance C, located off of [address] parking   For your lower back pain you may:  Purchase a pregnancy/maternity support belt from [employer], [employer], [employer], [employer], etc and wear it while you are up and about  Take warm baths  Use a heating pad to your lower back for no longer than 20 minutes at a time, and do not place near abdomen  Take tylenol as needed. Please follow directions on the bottle  Kinesiology tape (can get from sporting goods store), google how to tape belly for pregnancy   Go to [email or web address] to register for FREE online childbirth classes   Call the office [phone]) or go to [facility] if:  You begin to have strong, frequent contractions  Your water breaks.  Sometimes it is a big gush of fluid, sometimes it is just a trickle that keeps getting your panties wet or running down your legs  You have vaginal bleeding.  It is normal to have a small amount of spotting if your cervix was checked.   You don't feel your baby moving like normal.  If you don't, get you something to eat and drink and lay down and focus on feeling your baby move.  You should feel at least 10 movements in 2 hours.  If you don't, you should call the office or go to [facility].    Tdap Vaccine  It is recommended that you get the Tdap vaccine during the third trimester of EACH pregnancy to help protect your baby from getting pertussis (whooping cough)  27-36 weeks is the BEST time to do this so that you can pass the protection on to your baby. During pregnancy is better than after pregnancy, but if you are unable to get it during pregnancy it will be offered at the  hospital.   You can get this vaccine with [place], at the health department, your family doctor, or some local pharmacies  Everyone who will be around your baby should also be up-to-date on their vaccines before the baby comes. Adults (who are not pregnant) only need 1 dose of Tdap during adulthood.   [place] Pediatricians/Family Doctors:  [facility] [phone]                 [facility] [phone] (usually not accepting new patients unless you have family there already, you are always welcome to call and ask)       [facility] Department [phone]       [facility] Pediatricians/Family Doctors:   [facility]: [phone]  [facility]: [phone]  [facility]: [place] Doctors:   [facility]: [facility]: [place]:  [place]: [phone]   Home Blood Pressure Monitoring for Patients   Your provider has recommended that you check your blood pressure (BP) at least once a week at home. If you do not have a blood pressure cuff at home, one will be provided for you. Contact your provider if you have not received your monitor  within 1 week.   Helpful Tips for Accurate Home Blood Pressure Checks  . Don't smoke, exercise, or drink caffeine 30 minutes before checking your BP . Use the restroom before checking your BP (a full bladder can raise your pressure) . Relax in a comfortable upright chair . Feet on the ground . Left arm resting comfortably on a flat surface at the level of your heart . Legs uncrossed . Back supported . Sit quietly and don't talk . Place the cuff on your bare arm . Adjust snuggly, so that only two fingertips can fit between your skin and the top of the cuff . Check 2 readings separated by at least one  minute . Keep a log of your BP readings . For a visual, please reference this diagram: http://ccnc.care/bpdiagram  Provider Name: [facility]     Phone: [phone]  Zone 1: ALL CLEAR  Continue to monitor your symptoms:  . BP reading is less than 140 (top number) or less than 90 (bottom number)  . No right upper stomach pain . No headaches or seeing spots . No feeling nauseated or throwing up . No swelling in face and hands  Zone 2: CAUTION Call your doctor's office for any of the following:  . BP reading is greater than 140 (top number) or greater than 90 (bottom number)  . Stomach pain under your ribs in the middle or right side . Headaches or seeing spots . Feeling nauseated or throwing up . Swelling in face and hands  Zone 3: EMERGENCY  Seek immediate medical care if you have any of the following:  . BP reading is greater than160 (top number) or greater than 110 (bottom number) . Severe headaches not improving with Tylenol . Serious difficulty catching your breath . Any worsening symptoms from Zone 2   Third Trimester of Pregnancy The third trimester is from week 29 through week 42, months 7 through 9. The third trimester is a time when the fetus is growing rapidly. At the end of the ninth month, the fetus is about 20 inches in length and weighs 6-10 pounds.  BODY CHANGES Your body goes through many changes during pregnancy. The changes vary from woman to woman.   Your weight will continue to increase. You can expect to gain 25-35 pounds (11-16 kg) by the end of the pregnancy.  You may begin to get stretch marks on your hips, abdomen, and breasts.  You may urinate more often because the fetus is moving lower into your pelvis and pressing on your bladder.  You may develop or continue to have heartburn as a result of your pregnancy.  You may develop constipation because certain hormones are causing the muscles that push waste through your intestines to slow  down.  You may develop hemorrhoids or swollen, bulging veins (varicose veins).  You may have pelvic pain because of the weight gain and pregnancy hormones relaxing your joints between the bones in your pelvis. Backaches may result from overexertion of the muscles supporting your posture.  You may have changes in your hair. These can include thickening of your hair, rapid growth, and changes in texture. Some women also have hair loss during or after pregnancy, or hair that feels dry or thin. Your hair will most likely return to normal after your baby is born.  Your breasts will continue to grow and be tender. A yellow discharge may leak from your breasts called colostrum.  Your belly button may stick out.  You  may feel short of breath because of your expanding uterus.  You may notice the fetus "dropping," or moving lower in your abdomen.  You may have a bloody mucus discharge. This usually occurs a few days to a week before labor begins.  Your cervix becomes thin and soft (effaced) near your due date. WHAT TO EXPECT AT YOUR PRENATAL EXAMS  You will have prenatal exams every 2 weeks until week 36. Then, you will have weekly prenatal exams. During a routine prenatal visit:  You will be weighed to make sure you and the fetus are growing normally.  Your blood pressure is taken.  Your abdomen will be measured to track your baby's growth.  The fetal heartbeat will be listened to.  Any test results from the previous visit will be discussed.  You may have a cervical check near your due date to see if you have effaced. At around 36 weeks, your caregiver will check your cervix. At the same time, your caregiver will also perform a test on the secretions of the vaginal tissue. This test is to determine if a type of bacteria, Group B streptococcus, is present. Your caregiver will explain this further. Your caregiver may ask you:  What your birth plan is.  How you are feeling.  If you are  feeling the baby move.  If you have had any abnormal symptoms, such as leaking fluid, bleeding, severe headaches, or abdominal cramping.  If you have any questions. Other tests or screenings that may be performed during your third trimester include:  Blood tests that check for low iron levels (anemia).  Fetal testing to check the health, activity level, and growth of the fetus. Testing is done if you have certain medical conditions or if there are problems during the pregnancy. FALSE LABOR You may feel small, irregular contractions that eventually go away. These are called Braxton Hicks contractions, or false labor. Contractions may last for hours, days, or even weeks before true labor sets in. If contractions come at regular intervals, intensify, or become painful, it is best to be seen by your caregiver.  SIGNS OF LABOR   Menstrual-like cramps.  Contractions that are 5 minutes apart or less.  Contractions that start on the top of the uterus and spread down to the lower abdomen and back.  A sense of increased pelvic pressure or back pain.  A watery or bloody mucus discharge that comes from the vagina. If you have any of these signs before the 37th week of pregnancy, call your caregiver right away. You need to go to the hospital to get checked immediately. HOME CARE INSTRUCTIONS   Avoid all smoking, herbs, alcohol, and unprescribed drugs. These chemicals affect the formation and growth of the baby.  Follow your caregiver's instructions regarding medicine use. There are medicines that are either safe or unsafe to take during pregnancy.  Exercise only as directed by your caregiver. Experiencing uterine cramps is a good sign to stop exercising.  Continue to eat regular, healthy meals.  Wear a good support bra for breast tenderness.  Do not use hot tubs, steam rooms, or saunas.  Wear your seat belt at all times when driving.  Avoid raw meat, uncooked cheese, cat litter boxes, and  soil used by cats. These carry germs that can cause birth defects in the baby.  Take your prenatal vitamins.  Try taking a stool softener (if your caregiver approves) if you develop constipation. Eat more high-fiber foods, such as fresh vegetables or fruit  and whole grains. Drink plenty of fluids to keep your urine clear or pale yellow.  Take warm sitz baths to soothe any pain or discomfort caused by hemorrhoids. Use hemorrhoid cream if your caregiver approves.  If you develop varicose veins, wear support hose. Elevate your feet for 15 minutes, 3-4 times a day. Limit salt in your diet.  Avoid heavy lifting, wear low heal shoes, and practice good posture.  Rest a lot with your legs elevated if you have leg cramps or low back pain.  Visit your dentist if you have not gone during your pregnancy. Use a soft toothbrush to brush your teeth and be gentle when you floss.  A sexual relationship may be continued unless your caregiver directs you otherwise.  Do not travel far distances unless it is absolutely necessary and only with the approval of your caregiver.  Take prenatal classes to understand, practice, and ask questions about the labor and delivery.  Make a trial run to the hospital.  Pack your hospital bag.  Prepare the baby's nursery.  Continue to go to all your prenatal visits as directed by your caregiver. SEEK MEDICAL CARE IF:  You are unsure if you are in labor or if your water has broken.  You have dizziness.  You have mild pelvic cramps, pelvic pressure, or nagging pain in your abdominal area.  You have persistent nausea, vomiting, or diarrhea.  You have a bad smelling vaginal discharge.  You have pain with urination. SEEK IMMEDIATE MEDICAL CARE IF:   You have a fever.  You are leaking fluid from your vagina.  You have spotting or bleeding from your vagina.  You have severe abdominal cramping or pain.  You have rapid weight loss or gain.  You have shortness  of breath with chest pain.  You notice sudden or extreme swelling of your face, hands, ankles, feet, or legs.  You have not felt your baby move in over an hour.  You have severe headaches that do not go away with medicine.  You have vision changes. Document Released: 12/28/2000 Document Revised: 01/08/2013 Document Reviewed: 03/06/2012 [facility] Patient Information 2015 [place], [place]. This information is not intended to replace advice given to you by your health care provider. Make sure you discuss any questions you have with your health care provider.  PROTECT YOURSELF & YOUR BABY FROM THE FLU! Because you are pregnant, we at [facility], along with the Centers for Disease Control (CDC), recommend that you receive the flu vaccine to protect yourself and your baby from the flu. The flu is more likely to cause severe illness in pregnant women than in women of reproductive age who are not pregnant. Changes in the immune system, heart, and lungs during pregnancy make pregnant women (and women up to two weeks postpartum) more prone to severe illness from flu, including illness resulting in hospitalization. Flu also may be harmful for a pregnant woman's developing baby. A common flu symptom is fever, which may be associated with neural tube defects and other adverse outcomes for a developing baby. Getting vaccinated can also help protect a baby after birth from flu. (Mom passes antibodies onto the developing baby during her pregnancy.)  A Flu Vaccine is the Best Protection Against Flu Getting a flu vaccine is the first and most important step in protecting against flu. Pregnant women should get a flu shot and not the live attenuated influenza vaccine (LAIV), also known as nasal spray flu vaccine. Flu vaccines given during pregnancy help protect  both the mother and her baby from flu. Vaccination has been shown to reduce the risk of flu-associated acute respiratory infection in pregnant women by up to  one-half. A 2018 study showed that getting a flu shot reduced a pregnant woman's risk of being hospitalized with flu by an average of 40 percent. Pregnant women who get a flu vaccine are also helping to protect their babies from flu illness for the first several months after their birth, when they are too young to get vaccinated.   A Long Record of Safety for Flu Shots in Pregnant Women Flu shots have been given to millions of pregnant women over many years with a good safety record. There is a lot of evidence that flu vaccines can be given safely during pregnancy; though these data are limited for the first trimester. The CDC recommends that pregnant women get vaccinated during any trimester of their pregnancy. It is very important for pregnant women to get the flu shot.   Other Preventive Actions In addition to getting a flu shot, pregnant women should take the same everyday preventive actions the CDC recommends of everyone, including covering coughs, washing hands often, and avoiding people who are sick.  Symptoms and Treatment If you get sick with flu symptoms call your doctor right away. There are antiviral drugs that can treat flu illness and prevent serious flu complications. The CDC recommends prompt treatment for people who have influenza infection or suspected influenza infection and who are at high risk of serious flu complications, such as people with asthma, diabetes (including gestational diabetes), or heart disease. Early treatment of influenza in hospitalized pregnant women has been shown to reduce the length of the hospital stay.  Symptoms Flu symptoms include fever, cough, sore throat, runny or stuffy nose, body aches, headache, chills and fatigue. Some people may also have vomiting and diarrhea. People may be infected with the flu and have respiratory symptoms without a fever.  Early Treatment is Important for Pregnant Women Treatment should begin as soon as possible because  antiviral drugs work best when started early (within 48 hours after symptoms start). Antiviral drugs can make your flu illness milder and make you feel better faster. They may also prevent serious health problems that can result from flu illness. Oral oseltamivir (Tamiflu) is the preferred treatment for pregnant women because it has the most studies available to suggest that it is safe and beneficial. Antiviral drugs require a prescription from your provider. Having a fever caused by flu infection or other infections early in pregnancy may be linked to birth defects in a baby. In addition to taking antiviral drugs, pregnant women who get a fever should treat their fever with Tylenol (acetaminophen) and contact their provider immediately.  When to [place] If you are pregnant and have any of these signs, seek care immediately:  Difficulty breathing or shortness of breath  Pain or pressure in the chest or abdomen  Sudden dizziness  Confusion  Severe or persistent vomiting  High fever that is not responding to Tylenol (or store brand equivalent)  Decreased or no movement of your baby  [email or web address]

## 2019-05-06 NOTE — Addendum Note (Signed)
Addended by: Octaviano Glow on: 05/06/2019 11:47 AM   Modules accepted: Orders

## 2019-05-06 NOTE — Progress Notes (Signed)
LOW-RISK PREGNANCY VISIT Patient name: Deanna Hughes MRN [id number]  Date of birth: 08-Oct-1990 Chief Complaint:   Routine Prenatal Visit  History of Present Illness:   Deanna Hughes is a 29 y.o. [id number] female at [redacted]w[redacted]d with an Estimated Date of Delivery: 07/10/19 being seen today for ongoing management of a low-risk pregnancy.  Depression screen [facility] 2/9 02/06/2019 12/31/2018 10/10/2018 10/02/2018 07/12/2018  Decreased Interest 0 1 0 0 0  Down, Depressed, Hopeless 0 1 0 1 0  PHQ - 2 Score 0 2 0 1 0  Altered sleeping - 0 - 0 0  Tired, decreased energy - 1 - 3 0  Change in appetite - 2 - 2 0  Feeling bad or failure about yourself  - 0 - 2 0  Trouble concentrating - 0 - 0 0  Moving slowly or fidgety/restless - 0 - 0 0  Suicidal thoughts - 0 - 0 0  PHQ-9 Score - 5 - 8 0  Difficult doing work/chores - - - Very difficult -  Some recent data might be hidden    Today she reports lots of pressure, pelvic pain, esp when at work, 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 Has to call out often d/t pain, wants FMLA papers changed to increase days allowed to be out. Doesn't want to come completely out yet unless absolutely necessary. Belt does not work. Vaginal discharge w/ some itching. Some contractions.  Contractions: Irritability. Vag. Bleeding: None.  Movement: Present. denies leaking of fluid. Review of Systems:   Pertinent items are noted in HPI Denies abnormal vaginal discharge w/ itching/odor/irritation, headaches, visual changes, shortness of breath, chest pain, abdominal pain, severe nausea/vomiting, or problems with urination or bowel movements unless otherwise stated above. Pertinent History Reviewed:  Reviewed past medical,surgical, social, obstetrical and family history.  Reviewed problem list, medications and allergies. Physical Assessment:   Vitals:   05/06/19 0909  BP: 111/72  Pulse: (!) 113  Weight: 171 lb (77.6 kg)  Body mass index is 31.28 kg/m.        Physical  Examination:   General appearance: Well appearing, and in no distress  Mental status: Alert, oriented to person, place, and time  Skin: Warm & dry  Cardiovascular: Normal heart rate noted  Respiratory: Normal respiratory effort, no distress  Abdomen: Soft, gravid, nontender  Pelvic: SSE cx visually closed, SVE: outer os 2cm/inner os closed/thick/-3         Extremities: Edema: Trace  Fetal Status:     Movement: Present    Chaperone: Alice Rieger    Results for orders placed or performed in visit on 05/06/19 (from the past 24 hour(s))  POC Urinalysis Dipstick OB   Collection Time: 05/06/19  9:11 AM  Result Value Ref Range   Color, UA     Clarity, UA     Glucose, UA Negative Negative   Bilirubin, UA     Ketones, UA neg    Spec Grav, UA     Blood, UA neg    pH, UA     POC,PROTEIN,UA Negative Negative, Trace, Small (1+), Moderate (2+), Large (3+), 4+   Urobilinogen, UA     Nitrite, UA neg    Leukocytes, UA Negative Negative   Appearance     Odor    POCT Wet Prep [facility] [place])   Collection Time: 05/06/19  9:46 AM  Result Value Ref Range   Source Wet Prep POC vaginal    WBC, Wet Prep  HPF POC many    Bacteria Wet Prep HPF POC Few Few   BACTERIA WET PREP MORPHOLOGY POC     Clue Cells Wet Prep HPF POC None None   Clue Cells Wet Prep Whiff POC Negative Whiff    Yeast Wet Prep HPF POC None None   KOH Wet Prep POC     Trichomonas Wet Prep HPF POC Absent Absent    Assessment & Plan:  1) Low-risk pregnancy [id number] at [redacted]w[redacted]d with an Estimated Date of Delivery: 07/10/19   2) Pelvic pressure/pain, try [place], note routed to [place] to increase FMLA days to 2/wk if needed  3) Vaginal d/c> wet prep w/ many WBCs, send gc/ct   Meds: No orders of the defined types were placed in this encounter.  Labs/procedures today: tdap, SSE, SVE  Plan:  Continue routine obstetrical care  Next visit: prefers in person    Reviewed: Preterm labor symptoms and general obstetric precautions including  but not limited to vaginal bleeding, contractions, leaking of fluid and fetal movement were reviewed in detail with the patient.  All questions were answered.   Follow-up: Return in about 2 weeks (around 05/20/2019) for LROB, CNM, in person.  Orders Placed This Encounter  Procedures  . GC/Chlamydia Probe Amp  . POC Urinalysis Dipstick OB  . POCT Wet Prep [facility] [place])   Roma Schanz CNM, [facility] [place] 05/06/2019 9:46 AM

## 2019-05-07 LAB — GC/CHLAMYDIA PROBE AMP
Chlamydia trachomatis, NAA: NEGATIVE
Neisseria Gonorrhoeae by PCR: NEGATIVE

## 2019-05-15 ENCOUNTER — Ambulatory Visit (INDEPENDENT_AMBULATORY_CARE_PROVIDER_SITE_OTHER): Payer: 59 | Admitting: Women's Health

## 2019-05-15 ENCOUNTER — Encounter: Payer: Self-pay | Admitting: Women's Health

## 2019-05-15 ENCOUNTER — Other Ambulatory Visit: Payer: Self-pay

## 2019-05-15 VITALS — BP 115/66 | HR 100 | Wt 175.0 lb

## 2019-05-15 DIAGNOSIS — O26893 Other specified pregnancy related conditions, third trimester: Secondary | ICD-10-CM | POA: Diagnosis not present

## 2019-05-15 DIAGNOSIS — Z1389 Encounter for screening for other disorder: Secondary | ICD-10-CM

## 2019-05-15 DIAGNOSIS — N898 Other specified noninflammatory disorders of vagina: Secondary | ICD-10-CM | POA: Diagnosis not present

## 2019-05-15 DIAGNOSIS — Z331 Pregnant state, incidental: Secondary | ICD-10-CM

## 2019-05-15 DIAGNOSIS — Z3483 Encounter for supervision of other normal pregnancy, third trimester: Secondary | ICD-10-CM

## 2019-05-15 DIAGNOSIS — Z3A32 32 weeks gestation of pregnancy: Secondary | ICD-10-CM | POA: Diagnosis not present

## 2019-05-15 LAB — POCT URINALYSIS DIPSTICK OB
Blood, UA: NEGATIVE
Glucose, UA: NEGATIVE
Ketones, UA: NEGATIVE
Leukocytes, UA: NEGATIVE
Nitrite, UA: NEGATIVE
POC,PROTEIN,UA: NEGATIVE

## 2019-05-15 LAB — POCT WET PREP (WET MOUNT)
Clue Cells Wet Prep Whiff POC: NEGATIVE
Trichomonas Wet Prep HPF POC: ABSENT

## 2019-05-15 NOTE — Progress Notes (Addendum)
Work-in LOW-RISK PREGNANCY VISIT Patient name: Deanna Hughes MRN [id number]  Date of birth: 15-Nov-1990 Chief Complaint:   Routine Prenatal Visit  History of Present Illness:   Deanna Hughes is a 29 y.o. [id number] female at [redacted]w[redacted]d with an Estimated Date of Delivery: 07/10/19 being seen today for ongoing management of a low-risk pregnancy.  Depression screen 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 Also umbilical hernia is getting bigger and starting to hurt. No change in discharge, no itching/odor/irritation.Contractions: Irregular. Vag. Bleeding: None.  Movement: Present. denies leaking of fluid. Review of Systems:   Pertinent items are noted in HPI Denies abnormal vaginal discharge w/ itching/odor/irritation, headaches, visual changes, shortness of breath, chest pain, abdominal pain, severe nausea/vomiting, or problems with urination or bowel movements unless otherwise stated above. Pertinent History Reviewed:  Reviewed past medical,surgical, social, obstetrical and family history.  Reviewed problem list, medications and allergies. Physical Assessment:   Vitals:   05/15/19 1459  BP: 115/66  Pulse: 100  Weight: 175 lb (79.4 kg)  Body mass index is 32.01 kg/m.        Physical Examination:   General appearance: Well appearing, and in no distress  Mental  status: Alert, oriented to person, place, and time  Skin: Warm & dry  Cardiovascular: Normal heart rate noted  Respiratory: Normal respiratory effort, no distress  Abdomen: Soft, gravid, reducible tender umbilical hernia, not incarcerated  Pelvic: spec: floppy multiparous cx, fFN collected, yellow nonodorous mucousy d/c, SVE: outer os 2cm, inner os closed/th/-3, fFN not sent         Extremities: Edema: Trace  Fetal Status: Fetal Heart Rate (bpm): 142 Fundal Height: 31 cm Movement: Present    Chaperone: [place]    Results for orders placed or performed in visit on 05/15/19 (from the past 24 hour(s))  POC Urinalysis Dipstick OB   Collection Time: 05/15/19  2:57 PM  Result Value Ref Range   Color, UA     Clarity, UA     Glucose, UA Negative Negative   Bilirubin, UA     Ketones, UA n    Spec Grav, UA     Blood, UA n    pH, UA     POC,PROTEIN,UA Negative Negative, Trace, Small (1+), Moderate (2+), Large (3+), 4+   Urobilinogen, UA     Nitrite, UA n    Leukocytes, UA Negative Negative   Appearance     Odor    POCT Wet Prep [facility])   Collection Time: 05/15/19  3:31 PM  Result Value Ref Range   Source Wet Prep POC vaginal    WBC, Wet Prep HPF POC few  Bacteria Wet Prep HPF POC Few Few   BACTERIA WET PREP MORPHOLOGY POC     Clue Cells Wet Prep HPF POC None None   Clue Cells Wet Prep Whiff POC Negative Whiff    Yeast Wet Prep HPF POC None None   KOH Wet Prep POC     Trichomonas Wet Prep HPF POC Absent Absent    Assessment & Plan:  1) Low-risk pregnancy [id number] at [redacted]w[redacted]d with an Estimated Date of Delivery: 07/10/19   2) Pressure/pain/irregular uc's, no cervical change. Neg wet prep, neg gc/ct 4/19. Has FMLA papers already to excuse up to 2-3d/wk absence d/t sx, working 8hr shifts. Reviewed ptl s/s, reasons to seek care  3) Reducible non-incarcerated umbilical hernia> discussed pushing in/placing rolled up sock/golf ball and pressure band, reviewed warning s/s  incarceration   Meds: No orders of the defined types were placed in this encounter.  Labs/procedures today: spec exam, SVE, wet prep  Plan:  Continue routine obstetrical care  Next visit: prefers in person    Reviewed: Preterm labor symptoms and general obstetric precautions including but not limited to vaginal bleeding, contractions, leaking of fluid and fetal movement were reviewed in detail with the patient.  All questions were answered.   Follow-up: Return for As scheduled.  Orders Placed This Encounter  Procedures  . POC Urinalysis Dipstick OB  . POCT Wet Prep [facility])   Roma Schanz CNM, [facility] 05/15/2019 3:31 PM

## 2019-05-15 NOTE — Patient Instructions (Signed)
Deanna Hughes, I greatly value your feedback.  If you receive a survey following your visit with [place] today, we appreciate you taking the time to fill it out.  Thanks, [name], CNM, WHNP-BC  [facility] [place] at [facility] ([place], [place] [address]) Entrance C, located off of [address] parking   Go to [email or web address] to register for FREE online childbirth classes    Call the office [phone]) or go to [facility] if:  You begin to have strong, frequent contractions  Your water breaks.  Sometimes it is a big gush of fluid, sometimes it is just a trickle that keeps getting your panties wet or running down your legs  You have vaginal bleeding.  It is normal to have a small amount of spotting if your cervix was checked.   You don't feel your baby moving like normal.  If you don't, get you something to eat and drink and lay down and focus on feeling your baby move.  You should feel at least 10 movements in 2 hours.  If you don't, you should call the office or go to [facility].   Call the office [phone]) or go to [facility] hospital for these signs of pre-eclampsia:  Severe headache that does not go away with Tylenol  Visual changes- seeing spots, double, blurred vision  Pain under your right breast or upper abdomen that does not go away with Tums or heartburn medicine  Nausea and/or vomiting  Severe swelling in your hands, feet, and face    Home Blood Pressure Monitoring for Patients   Your provider has recommended that you check your blood pressure (BP) at least once a week at home. If you do not have a blood pressure cuff at home, one will be provided for you. Contact your provider if you have not received your monitor within 1 week.   Helpful Tips for Accurate Home Blood Pressure Checks  . Don't smoke, exercise, or drink caffeine 30 minutes before checking your BP . Use the restroom before checking your BP (a full  bladder can raise your pressure) . Relax in a comfortable upright chair . Feet on the ground . Left arm resting comfortably on a flat surface at the level of your heart . Legs uncrossed . Back supported . Sit quietly and don't talk . Place the cuff on your bare arm . Adjust snuggly, so that only two fingertips can fit between your skin and the top of the cuff . Check 2 readings separated by at least one minute . Keep a log of your BP readings . For a visual, please reference this diagram: http://ccnc.care/bpdiagram  Provider Name: [facility]     Phone: [phone]  Zone 1: ALL CLEAR  Continue to monitor your symptoms:  . BP reading is less than 140 (top number) or less than 90 (bottom number)  . No right upper stomach pain . No headaches or seeing spots . No feeling nauseated or throwing up . No swelling in face and hands  Zone 2: CAUTION Call your doctor's office for any of the following:  . BP reading is greater than 140 (top number) or greater than 90 (bottom number)  . Stomach pain under your ribs in the middle or right side . Headaches or seeing spots . Feeling nauseated or throwing up . Swelling in face and hands  Zone 3: EMERGENCY  Seek immediate medical care if you have any of  the following:  . BP reading is greater than160 (top number) or greater than 110 (bottom number) . Severe headaches not improving with Tylenol . Serious difficulty catching your breath . Any worsening symptoms from Zone 2  Preterm Labor and Birth Information  The normal length of a pregnancy is 39-41 weeks. Preterm labor is when labor starts before 37 completed weeks of pregnancy. What are the risk factors for preterm labor? Preterm labor is more likely to occur in women who:  Have certain infections during pregnancy such as a bladder infection, sexually transmitted infection, or infection inside the uterus (chorioamnionitis).  Have a shorter-than-normal cervix.  Have gone into  preterm labor before.  Have had surgery on their cervix.  Are younger than age 54 or older than age 25.  Are African American.  Are pregnant with twins or multiple babies (multiple gestation).  Take street drugs or smoke while pregnant.  Do not gain enough weight while pregnant.  Became pregnant shortly after having been pregnant. What are the symptoms of preterm labor? Symptoms of preterm labor include:  Cramps similar to those that can happen during a menstrual period. The cramps may happen with diarrhea.  Pain in the abdomen or lower back.  Regular uterine contractions that may feel like tightening of the abdomen.  A feeling of increased pressure in the pelvis.  Increased watery or bloody mucus discharge from the vagina.  Water breaking (ruptured amniotic sac). Why is it important to recognize signs of preterm labor? It is important to recognize signs of preterm labor because babies who are born prematurely may not be fully developed. This can put them at an increased risk for:  Long-term (chronic) heart and lung problems.  Difficulty immediately after birth with regulating body systems, including blood sugar, body temperature, heart rate, and breathing rate.  Bleeding in the brain.  Cerebral palsy.  Learning difficulties.  Death. These risks are highest for babies who are born before 48 weeks of pregnancy. How is preterm labor treated? Treatment depends on the length of your pregnancy, your condition, and the health of your baby. It may involve: 1. Having a stitch (suture) placed in your cervix to prevent your cervix from opening too early (cerclage). 2. Taking or being given medicines, such as: ? Hormone medicines. These may be given early in pregnancy to help support the pregnancy. ? Medicine to stop contractions. ? Medicines to help mature the baby's lungs. These may be prescribed if the risk of delivery is high. ? Medicines to prevent your baby from  developing cerebral palsy. If the labor happens before 34 weeks of pregnancy, you may need to stay in the hospital. What should I do if I think I am in preterm labor? If you think that you are going into preterm labor, call your health care provider right away. How can I prevent preterm labor in future pregnancies? To increase your chance of having a full-term pregnancy:  Do not use any tobacco products, such as cigarettes, chewing tobacco, and e-cigarettes. If you need help quitting, ask your health care provider.  Do not use street drugs or medicines that have not been prescribed to you during your pregnancy.  Talk with your health care provider before taking any herbal supplements, even if you have been taking them regularly.  Make sure you gain a healthy amount of weight during your pregnancy.  Watch for infection. If you think that you might have an infection, get it checked right away.  Make sure to  tell your health care provider if you have gone into preterm labor before. This information is not intended to replace advice given to you by your health care provider. Make sure you discuss any questions you have with your health care provider. Document Revised: 04/27/2018 Document Reviewed: 05/27/2015 Elsevier Patient Education  [place].

## 2019-05-20 ENCOUNTER — Ambulatory Visit (INDEPENDENT_AMBULATORY_CARE_PROVIDER_SITE_OTHER): Payer: 59 | Admitting: Advanced Practice Midwife

## 2019-05-20 ENCOUNTER — Other Ambulatory Visit: Payer: Self-pay

## 2019-05-20 ENCOUNTER — Encounter: Payer: Self-pay | Admitting: Advanced Practice Midwife

## 2019-05-20 VITALS — BP 113/63 | HR 99

## 2019-05-20 DIAGNOSIS — O368120 Decreased fetal movements, second trimester, not applicable or unspecified: Secondary | ICD-10-CM

## 2019-05-20 DIAGNOSIS — Z3483 Encounter for supervision of other normal pregnancy, third trimester: Secondary | ICD-10-CM

## 2019-05-20 DIAGNOSIS — Z3A32 32 weeks gestation of pregnancy: Secondary | ICD-10-CM | POA: Diagnosis not present

## 2019-05-20 DIAGNOSIS — O368130 Decreased fetal movements, third trimester, not applicable or unspecified: Secondary | ICD-10-CM

## 2019-05-20 DIAGNOSIS — Z331 Pregnant state, incidental: Secondary | ICD-10-CM

## 2019-05-20 DIAGNOSIS — Z1389 Encounter for screening for other disorder: Secondary | ICD-10-CM

## 2019-05-20 LAB — POCT URINALYSIS DIPSTICK OB
Blood, UA: NEGATIVE
Glucose, UA: NEGATIVE
Ketones, UA: NEGATIVE
Nitrite, UA: NEGATIVE

## 2019-05-20 NOTE — Patient Instructions (Addendum)
Deanna Hughes, I greatly value your feedback.  If you receive a survey following your visit with [place] today, we appreciate you taking the time to fill it out.  Thanks, Deanna Berthold, DNP, CNM  [place]!!! It is now [place] [facility] ([place], [place] [address]) Entrance located off of [address] parking   Go to [email or web address] to register for FREE online childbirth classes    Call the office [phone]) or go to [place] if:  You begin to have strong, frequent contractions  Your water breaks.  Sometimes it is a big gush of fluid, sometimes it is just a trickle that keeps getting your panties wet or running down your legs  You have vaginal bleeding.  It is normal to have a small amount of spotting if your cervix was checked.   You don't feel your baby moving like normal.  If you don't, get you something to eat and drink and lay down and focus on feeling your baby move.  You should feel at least 10 movements in 2 hours.  If you don't, you should call the office or go to [place] Blood Pressure Monitoring for Patients   Your provider has recommended that you check your blood pressure (BP) at least once a week at home. If you do not have a blood pressure cuff at home, one will be provided for you. Contact your provider if you have not received your monitor within 1 week.   Helpful Tips for Accurate Home Blood Pressure Checks  . Don't smoke, exercise, or drink caffeine 30 minutes before checking your BP . Use the restroom before checking your BP (a full bladder can raise your pressure) . Relax in a comfortable upright chair . Feet on the ground . Left arm resting comfortably on a flat surface at the level of your heart . Legs uncrossed . Back supported . Sit quietly and don't talk . Place the cuff on your bare arm . Adjust snuggly, so that only two fingertips can fit  between your skin and the top of the cuff . Check 2 readings separated by at least one minute . Keep a log of your BP readings . For a visual, please reference this diagram: http://ccnc.care/bpdiagram  Provider Name: [facility]     Phone: [phone]  Zone 1: ALL CLEAR  Continue to monitor your symptoms:  . BP reading is less than 140 (top number) or less than 90 (bottom number)  . No right upper stomach pain . No headaches or seeing spots . No feeling nauseated or throwing up . No swelling in face and hands  Zone 2: CAUTION Call your doctor's office for any of the following:  . BP reading is greater than 140 (top number) or greater than 90 (bottom number)  . Stomach pain under your ribs in the middle or right side . Headaches or seeing spots . Feeling nauseated or throwing up . Swelling in face and hands  Zone 3: EMERGENCY  Seek immediate medical care if you have any of the following:  . BP reading is greater than160 (top number) or greater than 110 (bottom number) . Severe headaches not improving with Tylenol . Serious difficulty catching your breath . Any worsening symptoms from Zone 2    Kinesiology taping for pregnancy:  Youtube has good vidoes of "how tos" for lower back, pelvic, hip pain; swelling  of feet, etc

## 2019-05-20 NOTE — Progress Notes (Signed)
   LOW-RISK PREGNANCY VISIT Patient name: Deanna Hughes MRN [id number]  Date of birth: [birth year] Chief Complaint:   Routine Prenatal Visit (pressure, pain)  History of Present Illness:   Deanna Hughes is a 29 y.o. [id number] female at [redacted]w[redacted]d with an Estimated Date of Delivery: 07/10/19 being seen today for ongoing management of a low-risk pregnancy.  Today she reports normal pregnancy complaints.  Note given to be taken off carts (dietary) d/t pusing >25 lbs. . Contractions: Irregular. Vag. Bleeding: None.  Movement: (!) Decreased. denies leaking of fluid. Review of Systems:   Pertinent items are noted in HPI Denies abnormal vaginal discharge w/ itching/odor/irritation, headaches, visual changes, shortness of breath, chest pain, abdominal pain, severe nausea/vomiting, or problems with urination or bowel movements unless otherwise stated above. Pertinent History Reviewed:  Reviewed past medical,surgical, social, obstetrical and family history.  Reviewed problem list, medications and allergies. Physical Assessment:   Vitals:   05/20/19 1056  BP: 113/63  Pulse: 99  There is no height or weight on file to calculate BMI.        Physical Examination:   General appearance: Well appearing, and in no distress  Mental status: Alert, oriented to person, place, and time  Skin: Warm & dry  Cardiovascular: Normal heart rate noted  Respiratory: Normal respiratory effort, no distress  Abdomen: Soft, gravid, nontender  Pelvic: Cervical exam deferred         Extremities: Edema: Trace  Fetal Status:     Movement: (!) Decreased   NST: FHR baseline 145 bpm, Variability: moderate, Accelerations:present, Decelerations:  Absent= Cat 1/Reactive  Chaperone: n/a    Results for orders placed or performed in visit on 05/20/19 (from the past 24 hour(s))  POC Urinalysis Dipstick OB   Collection Time: 05/20/19 11:06 AM  Result Value Ref Range   Color, UA     Clarity, UA     Glucose, UA Negative Negative     Bilirubin, UA     Ketones, UA neg    Spec Grav, UA     Blood, UA neg    pH, UA     POC,PROTEIN,UA Trace Negative, Trace, Small (1+), Moderate (2+), Large (3+), 4+   Urobilinogen, UA     Nitrite, UA neg    Leukocytes, UA Small (1+) (A) Negative   Appearance     Odor      Assessment & Plan:  1) Low-risk pregnancy [id number] at [redacted]w[redacted]d with an Estimated Date of Delivery: 07/10/19    Meds: No orders of the defined types were placed in this encounter.  Labs/procedures today: none  Plan:  Continue routine obstetrical care  Next visit: prefers in person    Reviewed: Preterm labor symptoms and general obstetric precautions including but not limited to vaginal bleeding, contractions, leaking of fluid and fetal movement were reviewed in detail with the patient.  All questions were answered. Has home bp cuff. Check bp weekly, let us know if >140/90.   Follow-up: Return in about 2 weeks (around 06/03/2019) for [place].  Orders Placed This Encounter  Procedures  . POC Urinalysis Dipstick OB   Christin Fudge DNP, CNM 05/20/2019 11:07 AM

## 2019-05-24 ENCOUNTER — Encounter (HOSPITAL_COMMUNITY): Payer: Self-pay | Admitting: Obstetrics & Gynecology

## 2019-05-24 ENCOUNTER — Inpatient Hospital Stay (HOSPITAL_COMMUNITY)
Admission: AD | Admit: 2019-05-24 | Discharge: 2019-05-24 | Disposition: A | Discharge status: Home / Self Care | Payer: 59 | Attending: Obstetrics & Gynecology | Admitting: Obstetrics & Gynecology

## 2019-05-24 ENCOUNTER — Other Ambulatory Visit: Payer: Self-pay

## 2019-05-24 DIAGNOSIS — Z825 Family history of asthma and other chronic lower respiratory diseases: Secondary | ICD-10-CM | POA: Insufficient documentation

## 2019-05-24 DIAGNOSIS — R102 Pelvic and perineal pain: Secondary | ICD-10-CM | POA: Diagnosis not present

## 2019-05-24 DIAGNOSIS — R630 Anorexia: Secondary | ICD-10-CM

## 2019-05-24 DIAGNOSIS — Z3689 Encounter for other specified antenatal screening: Secondary | ICD-10-CM

## 2019-05-24 DIAGNOSIS — Z3A33 33 weeks gestation of pregnancy: Secondary | ICD-10-CM | POA: Diagnosis not present

## 2019-05-24 DIAGNOSIS — N898 Other specified noninflammatory disorders of vagina: Secondary | ICD-10-CM | POA: Insufficient documentation

## 2019-05-24 DIAGNOSIS — Z823 Family history of stroke: Secondary | ICD-10-CM | POA: Insufficient documentation

## 2019-05-24 DIAGNOSIS — R55 Syncope and collapse: Secondary | ICD-10-CM

## 2019-05-24 DIAGNOSIS — Z8249 Family history of ischemic heart disease and other diseases of the circulatory system: Secondary | ICD-10-CM | POA: Insufficient documentation

## 2019-05-24 DIAGNOSIS — Z3483 Encounter for supervision of other normal pregnancy, third trimester: Secondary | ICD-10-CM

## 2019-05-24 DIAGNOSIS — O26893 Other specified pregnancy related conditions, third trimester: Secondary | ICD-10-CM

## 2019-05-24 DIAGNOSIS — Z91040 Latex allergy status: Secondary | ICD-10-CM | POA: Insufficient documentation

## 2019-05-24 DIAGNOSIS — O99283 Endocrine, nutritional and metabolic diseases complicating pregnancy, third trimester: Secondary | ICD-10-CM | POA: Diagnosis not present

## 2019-05-24 DIAGNOSIS — E860 Dehydration: Secondary | ICD-10-CM

## 2019-05-24 DIAGNOSIS — Z886 Allergy status to analgesic agent status: Secondary | ICD-10-CM | POA: Diagnosis not present

## 2019-05-24 DIAGNOSIS — Z833 Family history of diabetes mellitus: Secondary | ICD-10-CM | POA: Diagnosis not present

## 2019-05-24 DIAGNOSIS — O368130 Decreased fetal movements, third trimester, not applicable or unspecified: Secondary | ICD-10-CM

## 2019-05-24 DIAGNOSIS — O368131 Decreased fetal movements, third trimester, fetus 1: Secondary | ICD-10-CM | POA: Diagnosis not present

## 2019-05-24 LAB — CBC
HCT: 33.4 % — ABNORMAL LOW (ref 36.0–46.0)
Hemoglobin: 11 g/dL — ABNORMAL LOW (ref 12.0–15.0)
MCH: 28.9 pg (ref 26.0–34.0)
MCHC: 32.9 g/dL (ref 30.0–36.0)
MCV: 87.7 fL (ref 80.0–100.0)
Platelets: 165 10*3/uL (ref 150–400)
RBC: 3.81 MIL/uL — ABNORMAL LOW (ref 3.87–5.11)
RDW: 13.9 % (ref 11.5–15.5)
WBC: 9.6 10*3/uL (ref 4.0–10.5)
nRBC: 0 % (ref 0.0–0.2)

## 2019-05-24 LAB — WET PREP, GENITAL
Clue Cells Wet Prep HPF POC: NONE SEEN
Sperm: NONE SEEN
Trich, Wet Prep: NONE SEEN
Yeast Wet Prep HPF POC: NONE SEEN

## 2019-05-24 LAB — URINALYSIS, ROUTINE W REFLEX MICROSCOPIC
Bilirubin Urine: NEGATIVE
Glucose, UA: NEGATIVE mg/dL
Hgb urine dipstick: NEGATIVE
Ketones, ur: 20 mg/dL — AB
Nitrite: NEGATIVE
Protein, ur: 30 mg/dL — AB
Specific Gravity, Urine: 1.028 (ref 1.005–1.030)
pH: 6 (ref 5.0–8.0)

## 2019-05-24 LAB — GLUCOSE, CAPILLARY: Glucose-Capillary: 74 mg/dL (ref 70–99)

## 2019-05-24 NOTE — Discharge Instructions (Signed)
Dehydration, Adult Dehydration is a condition in which there is not enough water or other fluids in the body. This happens when a person loses more fluids than he or she takes in. Important organs, such as the kidneys, brain, and heart, cannot function without a proper amount of fluids. Any loss of fluids from the body can lead to dehydration. Dehydration can be mild, moderate, or severe. It should be treated right away to prevent it from becoming severe. What are the causes? Dehydration may be caused by:  Conditions that cause loss of water or other fluids, such as diarrhea, vomiting, or sweating or urinating a lot.  Not drinking enough fluids, especially when you are ill or doing activities that require a lot of energy.  Other illnesses and conditions, such as fever or infection.  Certain medicines, such as medicines that remove excess fluid from the body (diuretics).  Lack of safe drinking water.  Not being able to get enough water and food. What increases the risk? The following factors may make you more likely to develop this condition:  Having a long-term (chronic) illness that has not been treated properly, such as diabetes, heart disease, or kidney disease.  Being 20 years of age or older.  Having a disability.  Living in a place that is high in altitude, where thinner, drier air causes more fluid loss.  Doing exercises that put stress on your body for a long time (endurance sports). What are the signs or symptoms? Symptoms of dehydration depend on how severe it is. Mild or moderate dehydration  Thirst.  Dry lips or dry mouth.  Dizziness or light-headedness, especially when standing up from a seated position.  Muscle cramps.  Dark urine. Urine may be the color of tea.  Less urine or tears produced than usual.  Headache. Severe dehydration  Changes in skin. Your skin may be cold and clammy, blotchy, or pale. Your skin also may not return to normal after being  lightly pinched and released.  Little or no tears, urine, or sweat.  Changes in vital signs, such as rapid breathing and low blood pressure. Your pulse may be weak or may be faster than 100 beats a minute when you are sitting still.  Other changes, such as: ? Feeling very thirsty. ? Sunken eyes. ? Cold hands and feet. ? Confusion. ? Being very tired (lethargic) or having trouble waking from sleep. ? Short-term weight loss. ? Loss of consciousness. How is this diagnosed? This condition is diagnosed based on your symptoms and a physical exam. You may have blood and urine tests to help confirm the diagnosis. How is this treated? Treatment for this condition depends on how severe it is. Treatment should be started right away. Do not wait until dehydration becomes severe. Severe dehydration is an emergency and needs to be treated in a hospital.  Mild or moderate dehydration can be treated at home. You may be asked to: ? Drink more fluids. ? Drink an oral rehydration solution (ORS). This drink helps restore proper amounts of fluids and salts and minerals in the blood (electrolytes).  Severe dehydration can be treated: ? With IV fluids. ? By correcting abnormal levels of electrolytes. This is often done by giving electrolytes through a tube that is passed through your nose and into your stomach (nasogastric tube, or NG tube). ? By treating the underlying cause of dehydration. Follow these instructions at home: Oral rehydration solution If told by your health care provider, drink an ORS:  Make  an ORS by following instructions on the package.  Start by drinking small amounts, about  cup (120 mL) every 5-10 minutes.  Slowly increase how much you drink until you have taken the amount recommended by your health care provider. Eating and drinking         Drink enough clear fluid to keep your urine pale yellow. If you were told to drink an ORS, finish the ORS first and then start slowly  drinking other clear fluids. Drink fluids such as: ? Water. Do not drink only water. Doing that can lead to hyponatremia, which is having too little salt (sodium) in the body. ? Water from ice chips you suck on. ? Fruit juice that you have added water to (diluted fruit juice). ? Low-calorie sports drinks.  Eat foods that contain a healthy balance of electrolytes, such as bananas, oranges, potatoes, tomatoes, and spinach.  Do not drink alcohol.  Avoid the following: ? Drinks that contain a lot of sugar. These include high-calorie sports drinks, fruit juice that is not diluted, and soda. ? Caffeine. ? Foods that are greasy or contain a lot of fat or sugar. General instructions  Take over-the-counter and prescription medicines only as told by your health care provider.  Do not take sodium tablets. Doing that can lead to having too much sodium in the body (hypernatremia).  Return to your normal activities as told by your health care provider. Ask your health care provider what activities are safe for you.  Keep all follow-up visits as told by your health care provider. This is important. Contact a health care provider if:  You have muscle cramps, pain, or discomfort, such as: ? Pain in your abdomen and the pain gets worse or stays in one area (localizes). ? Stiff neck.  You have a rash.  You are more irritable than usual.  You are sleepier or have a harder time waking than usual.  You feel weak or dizzy.  You feel very thirsty. Get help right away if you have:  Any symptoms of severe dehydration.  Symptoms of vomiting, such as: ? You cannot eat or drink without vomiting. ? Vomiting gets worse or does not go away. ? Vomit includes blood or green matter (bile).  Symptoms that get worse with treatment.  A fever.  A severe headache.  Problems with urination or bowel movements, such as: ? Diarrhea that gets worse or does not go away. ? Blood in your stool (feces). This  may cause stool to look black and tarry. ? Not urinating, or urinating only a small amount of very dark urine, within 6-8 hours.  Trouble breathing. These symptoms may represent a serious problem that is an emergency. Do not wait to see if the symptoms will go away. Get medical help right away. Call your local emergency services (911 in the [place] Do not drive yourself to the hospital. Summary  Dehydration is a condition in which there is not enough water or other fluids in the body. This happens when a person loses more fluids than he or she takes in.  Treatment for this condition depends on how severe it is. Treatment should be started right away. Do not wait until dehydration becomes severe.  Drink enough clear fluid to keep your urine pale yellow. If you were told to drink an oral rehydration solution (ORS), finish the ORS first and then start slowly drinking other clear fluids.  Take over-the-counter and prescription medicines only as told by your health care  provider.  Get help right away if you have any symptoms of severe dehydration. This information is not intended to replace advice given to you by your health care provider. Make sure you discuss any questions you have with your health care provider. Document Revised: 08/16/2018 Document Reviewed: 08/16/2018 Elsevier Patient Education  [place].   Syncope Syncope is when you pass out (faint) for a short time. It is caused by a sudden decrease in blood flow to the brain. Signs that you may be about to pass out include:  Feeling dizzy or light-headed.  Feeling sick to your stomach (nauseous).  Seeing all white or all black.  Having cold, clammy skin. If you pass out, get help right away. Call your local emergency services (911 in the [place] Do not drive yourself to the hospital. Follow these instructions at home: Watch for any changes in your symptoms. Take these actions to stay safe and help with your  symptoms: Lifestyle  Do not drive, use machinery, or play sports until your doctor says it is okay.  Do not drink alcohol.  Do not use any products that contain nicotine or tobacco, such as cigarettes and e-cigarettes. If you need help quitting, ask your doctor.  Drink enough fluid to keep your pee (urine) pale yellow. General instructions  Take over-the-counter and prescription medicines only as told by your doctor.  If you are taking blood pressure or heart medicine, sit up and stand up slowly. Spend a few minutes getting ready to sit and then stand. This can help you feel less dizzy.  Have someone stay with you until you feel stable.  If you start to feel like you might pass out, lie down right away and raise (elevate) your feet above the level of your heart. Breathe deeply and steadily. Wait until all of the symptoms are gone.  Keep all follow-up visits as told by your doctor. This is important. Get help right away if:  You have a very bad headache.  You pass out once or more than once.  You have pain in your chest, belly, or back.  You have a very fast or uneven heartbeat (palpitations).  It hurts to breathe.  You are bleeding from your mouth or your bottom (rectum).  You have black or tarry poop (stool).  You have jerky movements that you cannot control (seizure).  You are confused.  You have trouble walking.  You are very weak.  You have vision problems. These symptoms may be an emergency. Do not wait to see if the symptoms will go away. Get medical help right away. Call your local emergency services (911 in the [place] Do not drive yourself to the hospital. Summary  Syncope is when you pass out (faint) for a short time. It is caused by a sudden decrease in blood flow to the brain.  Signs that you may be about to faint include feeling dizzy, light-headed, or sick to your stomach, seeing all white or all black, or having cold, clammy skin.  If you start to  feel like you might pass out, lie down right away and raise (elevate) your feet above the level of your heart. Breathe deeply and steadily. Wait until all of the symptoms are gone. This information is not intended to replace advice given to you by your health care provider. Make sure you discuss any questions you have with your health care provider. Document Revised: 02/15/2017 Document Reviewed: 02/15/2017 Elsevier Patient Education  2020 [employer].  Abdominal Pain During Pregnancy  Belly (abdominal) pain is common during pregnancy. There are many possible causes. Most of the time, it is not a serious problem. Other times, it can be a sign that something is wrong with the pregnancy. Always tell your doctor if you have belly pain. Follow these instructions at home:  Do not have sex or put anything in your vagina until your pain goes away completely.  Get plenty of rest until your pain gets better.  Drink enough fluid to keep your pee (urine) pale yellow.  Take over-the-counter and prescription medicines only as told by your doctor.  Keep all follow-up visits as told by your doctor. This is important. Contact a doctor if:  Your pain continues or gets worse after resting.  You have lower belly pain that: ? Comes and goes at regular times. ? Spreads to your back. ? Feels like menstrual cramps.  You have pain or burning when you pee (urinate). Get help right away if:  You have a fever or chills.  You have vaginal bleeding.  You are leaking fluid from your vagina.  You are passing tissue from your vagina.  You throw up (vomit) for more than 24 hours.  You have watery poop (diarrhea) for more than 24 hours.  Your baby is moving less than usual.  You feel very weak or faint.  You have shortness of breath.  You have very bad pain in your upper belly. Summary  Belly (abdominal) pain is common during pregnancy. There are many possible causes.  If you have belly pain  during pregnancy, tell your doctor right away.  Keep all follow-up visits as told by your doctor. This is important. This information is not intended to replace advice given to you by your health care provider. Make sure you discuss any questions you have with your health care provider. Document Revised: 04/23/2018 Document Reviewed: 04/07/2016 Elsevier Patient Education  2020 [place].   Fetal Movement Counts Patient Name: ________________________________________________ Patient Due Date: ____________________ What is a fetal movement count?  A fetal movement count is the number of times that you feel your baby move during a certain amount of time. This may also be called a fetal kick count. A fetal movement count is recommended for every pregnant woman. You may be asked to start counting fetal movements as early as week 28 of your pregnancy. Pay attention to when your baby is most active. You may notice your baby's sleep and wake cycles. You may also notice things that make your baby move more. You should do a fetal movement count:  When your baby is normally most active.  At the same time each day. A good time to count movements is while you are resting, after having something to eat and drink. How do I count fetal movements? 1. Find a quiet, comfortable area. Sit, or lie down on your side. 2. Write down the date, the start time and stop time, and the number of movements that you felt between those two times. Take this information with you to your health care visits. 3. Write down your start time when you feel the first movement. 4. Count kicks, flutters, swishes, rolls, and jabs. You should feel at least 10 movements. 5. You may stop counting after you have felt 10 movements, or if you have been counting for 2 hours. Write down the stop time. 6. If you do not feel 10 movements in 2 hours, contact your health care provider for further  instructions. Your health care provider may want  to do additional tests to assess your baby's well-being. Contact a health care provider if:  You feel fewer than 10 movements in 2 hours.  Your baby is not moving like he or she usually does. Date: ____________ Start time: ____________ Stop time: ____________ Movements: ____________ Date: ____________ Start time: ____________ Stop time: ____________ Movements: ____________ Date: ____________ Start time: ____________ Stop time: ____________ Movements: ____________ Date: ____________ Start time: ____________ Stop time: ____________ Movements: ____________ Date: ____________ Start time: ____________ Stop time: ____________ Movements: ____________ Date: ____________ Start time: ____________ Stop time: ____________ Movements: ____________ Date: ____________ Start time: ____________ Stop time: ____________ Movements: ____________ Date: ____________ Start time: ____________ Stop time: ____________ Movements: ____________ Date: ____________ Start time: ____________ Stop time: ____________ Movements: ____________ This information is not intended to replace advice given to you by your health care provider. Make sure you discuss any questions you have with your health care provider. Document Revised: 08/23/2018 Document Reviewed: 08/23/2018 Elsevier Patient Education  [place].

## 2019-05-24 NOTE — MAU Provider Note (Signed)
History     CSN: [id number]  Arrival date and time: 05/24/19 1716   First Provider Initiated Contact with Patient 05/24/19 1847      Chief Complaint  Patient presents with  . Decreased Fetal Movement  . Loss of Consciousness   29 y.o. [id number] @33 .2 wks presenting with syncopal episode and decreased FM. Episode happened around 1pm. She leaned back on her co-worker and was eased to the ground. She reports last meal was around 9am. She had not had anything to drink prior. States she does not drink water. Also reports decreased FM today and increased pelvic pressure and discharge. Denies VB, LOF, and ctx. Her pregnancy has been uncomplicated.    OB History    Gravida  6   Para  3   Term  3   Preterm  0   AB  2   Living  3     SAB  2   TAB  0   Ectopic  0   Multiple  0   Live Births  3        Obstetric Comments  1st Menstrual Cycle:  15  1st Pregnancy:  18         Past Medical History:  Diagnosis Date  . Anxiety   . BV (bacterial vaginosis) 04/02/2015  . Chlamydia    treated 6/6  . Chronic back pain   . Chronic headaches    MIGRAINES  . Contraceptive management 01/25/2013  . Encounter for Nexplanon removal 02/27/2015  . Irregular bleeding 06/03/2013  . Miscarriage 03/27/2013  . Nexplanon insertion 04/11/2013   nexplanon inserted 04/11/13 remove 3/32/18  . Right sided abdominal pain   . Scoliosis   . Seasonal allergies 07/03/2012  . Sinus infection 08/27/2013  . Supervision of normal pregnancy 08/27/2015    [facility] Initiated Care at   7+5 weeks  FOB  Dating By  LMP  Pap  GC/CT Initial:                36+wks: Genetic Screen NT/IT:  CF screen  Anatomic [place]  Flu vaccine  Tdap Recommended ~ 28wks Glucose Screen  2 hr GBS  Feed Preference  Contraception  Circumcision  Childbirth Classes  Pediatrician    . Threatened abortion in early pregnancy 03/20/2013  . Vaginal discharge 10/03/2012  . Yeast infection 07/03/2012    Past Surgical History:  Procedure  Laterality Date  . BIOPSY  09/18/2018   Procedure: BIOPSY;  Surgeon: Danie Binder, MD;  [facility];  Service: Endoscopy;;  . COLONOSCOPY WITH PROPOFOL N/A 09/18/2018   Procedure: COLONOSCOPY WITH PROPOFOL;  Surgeon: Danie Binder, MD;  Location: [facility];  Service: Endoscopy;  Laterality: N/A;  8:30am  . FLEXIBLE SIGMOIDOSCOPY N/A 10/05/2018   Procedure: FLEXIBLE SIGMOIDOSCOPY;  Surgeon: Danie Binder, MD;  Location: [facility];  Service: Endoscopy;  Laterality: N/A;  8:30  . HEMORRHOID BANDING N/A 10/05/2018   Procedure: Thayer Jew;  Surgeon: Danie Binder, MD;  Location: [facility];  Service: Endoscopy;  Laterality: N/A;  . HERNIA REPAIR    . INGUINAL HERNIA REPAIR Right 04/19/2017   Procedure: HERNIA REPAIR INGUINAL ADULT;  Surgeon: Robert Bellow, MD;  Location: [facility];  Service: General;  Laterality: Right;  . WISDOM TOOTH EXTRACTION      Family History  Problem Relation Age of Onset  . Asthma Mother   . Asthma Sister   . Asthma Brother   . Asthma  Daughter   . Diabetes Maternal Grandmother   . Hypertension Maternal Grandmother   . Asthma Maternal Grandmother   . Heart disease Maternal Grandmother   . Stroke Maternal Grandmother   . Hypertension Maternal Grandfather   . Asthma Maternal Grandfather   . Heart disease Maternal Grandfather   . Asthma Son   . Colon cancer Neg Hx   . Colon polyps Neg Hx     Social History   Tobacco Use  . Smoking status: Never Smoker  . Smokeless tobacco: Never Used  Substance Use Topics  . Alcohol use: No  . Drug use: No    Allergies:  Allergies  Allergen Reactions  . Aspirin Shortness Of Breath and Other (See Comments)    Heart beats fast  . Advil [Ibuprofen] Other (See Comments)    Breakout, heart palpitations and trouble breathing.   . Latex Rash    Medications Prior to Admission  Medication Sig Dispense Refill Last Dose  . fluticasone (FLONASE) 50 MCG/ACT nasal spray Place into both  nostrils daily.   05/24/2019 at Unknown time  . prenatal vitamin w/FE, FA (PRENATAL 1 + 1) 27-1 MG TABS tablet Take 1 tablet by mouth daily at 12 noon. 30 tablet 11 05/24/2019 at Unknown time  . Misc. Devices MISC Dispense one maternity belt for patient 1 each 0     Review of Systems  Constitutional: Negative for chills and fever.  Gastrointestinal: Negative for abdominal pain.  Genitourinary: Positive for vaginal discharge. Negative for dysuria and vaginal bleeding.  Neurological: Positive for syncope.   Physical Exam   Blood pressure 112/67, pulse (!) 101, resp. rate 18, last menstrual period 09/23/2018. Orthostatic VS for the past 24 hrs:  BP- Lying Pulse- Lying BP- Sitting Pulse- Sitting BP- Standing at 0 minutes Pulse- Standing at 0 minutes  05/24/19 1745 102/68 95 106/66 94 103/71 103   Physical Exam  Nursing note and vitals reviewed. Constitutional: She is oriented to person, place, and time. She appears well-developed and well-nourished. No distress.  HENT:  Head: Normocephalic and atraumatic.  Cardiovascular: Normal rate.  Respiratory: Effort normal. No respiratory distress.  GI: Soft. She exhibits no distension. There is no abdominal tenderness.  gravid  Genitourinary:    Genitourinary Comments: VE: closed/thick   Musculoskeletal:        General: Normal range of motion.     Cervical back: Normal range of motion.  Neurological: She is alert and oriented to person, place, and time.  Skin: Skin is warm and dry.  Psychiatric: She has a normal mood and affect.  EFM: 125 bpm, mod variability, + accels, no decels Toco: rare  Results for orders placed or performed during the hospital encounter of 05/24/19 (from the past 24 hour(s))  Urinalysis, Routine w reflex microscopic     Status: Abnormal   Collection Time: 05/24/19  5:26 PM  Result Value Ref Range   Color, Urine AMBER (A) YELLOW   APPearance CLEAR CLEAR   Specific Gravity, Urine 1.028 1.005 - 1.030   pH 6.0 5.0 - 8.0    Glucose, UA NEGATIVE NEGATIVE mg/dL   Hgb urine dipstick NEGATIVE NEGATIVE   Bilirubin Urine NEGATIVE NEGATIVE   Ketones, ur 20 (A) NEGATIVE mg/dL   Protein, ur 30 (A) NEGATIVE mg/dL   Nitrite NEGATIVE NEGATIVE   Leukocytes,Ua TRACE (A) NEGATIVE   RBC / HPF 0-5 0 - 5 RBC/hpf   WBC, UA 6-10 0 - 5 WBC/hpf   Bacteria, UA RARE (A) NONE SEEN  Squamous Epithelial / LPF 0-5 0 - 5   Mucus PRESENT   CBC     Status: Abnormal   Collection Time: 05/24/19  5:59 PM  Result Value Ref Range   WBC 9.6 4.0 - 10.5 K/uL   RBC 3.81 (L) 3.87 - 5.11 MIL/uL   Hemoglobin 11.0 (L) 12.0 - 15.0 g/dL   HCT 33.4 (L) 36.0 - 46.0 %   MCV 87.7 80.0 - 100.0 fL   MCH 28.9 26.0 - 34.0 pg   MCHC 32.9 30.0 - 36.0 g/dL   RDW 13.9 11.5 - 15.5 %   Platelets 165 150 - 400 K/uL   nRBC 0.0 0.0 - 0.2 %  Glucose, capillary     Status: None   Collection Time: 05/24/19  6:21 PM  Result Value Ref Range   Glucose-Capillary 74 70 - 99 mg/dL  Wet prep, genital     Status: Abnormal   Collection Time: 05/24/19  7:20 PM   Specimen: PATH Cytology Cervicovaginal Ancillary Only  Result Value Ref Range   Yeast Wet Prep HPF POC NONE SEEN NONE SEEN   Trich, Wet Prep NONE SEEN NONE SEEN   Clue Cells Wet Prep HPF POC NONE SEEN NONE SEEN   WBC, Wet Prep HPF POC MANY (A) NONE SEEN   Sperm NONE SEEN    MAU Course  Procedures  MDM Labs and EKG ordered. EKG normal. Not orthostatic. UA indicates dehydration which may have contributed to episode. Recommend 5-6 bottles of water per day and needs to eat often. Reports feeling good FM now and NST reactive, pt reassured. Stable for discharge home.   Assessment and Plan  [redacted] weeks gestation NST reactive Decreased fetal movement Syncope Dehydration Pelvic pain in pregnancy Vaginal discharge in pregnancy Discharge home [facility] at [facility] as scheduled PTL precautions Return precautions Maintain good hydration  Allergies as of 05/24/2019      Reactions   Aspirin Shortness Of  Breath, Other (See Comments)   Heart beats fast   Advil [ibuprofen] Other (See Comments)   Breakout, heart palpitations and trouble breathing.    Latex Rash      Medication List    TAKE these medications   fluticasone 50 MCG/ACT nasal spray Commonly known as: FLONASE Place into both nostrils daily.   Misc. Devices Misc Dispense one maternity belt for patient   prenatal vitamin w/FE, FA 27-1 MG Tabs tablet Take 1 tablet by mouth daily at 12 noon.      Julianne Handler, CNM 05/24/2019, 6:58 PM

## 2019-05-24 NOTE — MAU Note (Signed)
Pt stated she had not felt baby move as mucha as usual today. Also stated she was at work today and around 1pm "passed out'.someone was behind her so she did not fall. Had had some breakfast earlier but not had lunch yet. Also c/o" strange pain" in her stomach that makes her SOB breath that happens a couple of times a day for the past 3 days and increased pelvic pressure,

## 2019-05-27 ENCOUNTER — Telehealth: Payer: Self-pay | Admitting: Obstetrics & Gynecology

## 2019-05-27 ENCOUNTER — Telehealth: Payer: Self-pay | Admitting: *Deleted

## 2019-05-27 LAB — GC/CHLAMYDIA PROBE AMP (~~LOC~~) NOT AT ARMC
Chlamydia: NEGATIVE
Comment: NEGATIVE
Comment: NORMAL
Neisseria Gonorrhea: NEGATIVE

## 2019-05-27 NOTE — Telephone Encounter (Signed)
Pt requesting a call back regarding questions about her FMLA.

## 2019-05-27 NOTE — Telephone Encounter (Signed)
Pt returning your call

## 2019-05-27 NOTE — Telephone Encounter (Signed)
LMOVM returning patient call.

## 2019-05-28 ENCOUNTER — Telehealth: Payer: Self-pay | Admitting: *Deleted

## 2019-05-28 NOTE — Telephone Encounter (Signed)
Spoke to patient who wanted to inform me of when she came out of work due to her job not being able to accommodate her work restrictions.

## 2019-06-03 ENCOUNTER — Ambulatory Visit (INDEPENDENT_AMBULATORY_CARE_PROVIDER_SITE_OTHER): Payer: 59 | Admitting: Women's Health

## 2019-06-03 ENCOUNTER — Encounter: Payer: Self-pay | Admitting: Women's Health

## 2019-06-03 VITALS — BP 102/66 | HR 91 | Wt 173.0 lb

## 2019-06-03 DIAGNOSIS — Z3483 Encounter for supervision of other normal pregnancy, third trimester: Secondary | ICD-10-CM

## 2019-06-03 DIAGNOSIS — Z331 Pregnant state, incidental: Secondary | ICD-10-CM

## 2019-06-03 DIAGNOSIS — Z1389 Encounter for screening for other disorder: Secondary | ICD-10-CM

## 2019-06-03 DIAGNOSIS — Z3A34 34 weeks gestation of pregnancy: Secondary | ICD-10-CM

## 2019-06-03 LAB — POCT URINALYSIS DIPSTICK OB
Blood, UA: NEGATIVE
Glucose, UA: NEGATIVE
Ketones, UA: NEGATIVE
Leukocytes, UA: NEGATIVE
Nitrite, UA: NEGATIVE
POC,PROTEIN,UA: NEGATIVE

## 2019-06-03 NOTE — Patient Instructions (Addendum)
Deanna Hughes, I greatly value your feedback.  If you receive a survey following your visit with [place] today, we appreciate you taking the time to fill it out.  Thanks, [name], CNM, WHNP-BC  [facility] [place] [facility] ([place], [place] [address]) Entrance C, located off of [address] parking   Go to [email or web address] to register for FREE online childbirth classes    Call the office [phone]) or go to [facility] if:  You begin to have strong, frequent contractions  Your water breaks.  Sometimes it is a big gush of fluid, sometimes it is just a trickle that keeps getting your panties wet or running down your legs  You have vaginal bleeding.  It is normal to have a small amount of spotting if your cervix was checked.   You don't feel your baby moving like normal.  If you don't, get you something to eat and drink and lay down and focus on feeling your baby move.  You should feel at least 10 movements in 2 hours.  If you don't, you should call the office or go to [facility].   Call the office [phone]) or go to [facility] hospital for these signs of pre-eclampsia:  Severe headache that does not go away with Tylenol  Visual changes- seeing spots, double, blurred vision  Pain under your right breast or upper abdomen that does not go away with Tums or heartburn medicine  Nausea and/or vomiting  Severe swelling in your hands, feet, and face    Home Blood Pressure Monitoring for Patients   Your provider has recommended that you check your blood pressure (BP) at least once a week at home. If you do not have a blood pressure cuff at home, one will be provided for you. Contact your provider if you have not received your monitor within 1 week.   Helpful Tips for Accurate Home Blood Pressure Checks  . Don't smoke, exercise, or drink caffeine 30 minutes before checking your BP . Use the restroom before checking your BP (a full  bladder can raise your pressure) . Relax in a comfortable upright chair . Feet on the ground . Left arm resting comfortably on a flat surface at the level of your heart . Legs uncrossed . Back supported . Sit quietly and don't talk . Place the cuff on your bare arm . Adjust snuggly, so that only two fingertips can fit between your skin and the top of the cuff . Check 2 readings separated by at least one minute . Keep a log of your BP readings . For a visual, please reference this diagram: http://ccnc.care/bpdiagram  Provider Name: [facility]     Phone: [phone]  Zone 1: ALL CLEAR  Continue to monitor your symptoms:  . BP reading is less than 140 (top number) or less than 90 (bottom number)  . No right upper stomach pain . No headaches or seeing spots . No feeling nauseated or throwing up . No swelling in face and hands  Zone 2: CAUTION Call your doctor's office for any of the following:  . BP reading is greater than 140 (top number) or greater than 90 (bottom number)  . Stomach pain under your ribs in the middle or right side . Headaches or seeing spots . Feeling nauseated or throwing up . Swelling in face and hands  Zone 3: EMERGENCY  Seek immediate medical care if you have any of  the following:  . BP reading is greater than160 (top number) or greater than 110 (bottom number) . Severe headaches not improving with Tylenol . Serious difficulty catching your breath . Any worsening symptoms from Zone 2  Preterm Labor and Birth Information  The normal length of a pregnancy is 39-41 weeks. Preterm labor is when labor starts before 37 completed weeks of pregnancy. What are the risk factors for preterm labor? Preterm labor is more likely to occur in women who:  Have certain infections during pregnancy such as a bladder infection, sexually transmitted infection, or infection inside the uterus (chorioamnionitis).  Have a shorter-than-normal cervix.  Have gone into  preterm labor before.  Have had surgery on their cervix.  Are younger than age 54 or older than age 25.  Are African American.  Are pregnant with twins or multiple babies (multiple gestation).  Take street drugs or smoke while pregnant.  Do not gain enough weight while pregnant.  Became pregnant shortly after having been pregnant. What are the symptoms of preterm labor? Symptoms of preterm labor include:  Cramps similar to those that can happen during a menstrual period. The cramps may happen with diarrhea.  Pain in the abdomen or lower back.  Regular uterine contractions that may feel like tightening of the abdomen.  A feeling of increased pressure in the pelvis.  Increased watery or bloody mucus discharge from the vagina.  Water breaking (ruptured amniotic sac). Why is it important to recognize signs of preterm labor? It is important to recognize signs of preterm labor because babies who are born prematurely may not be fully developed. This can put them at an increased risk for:  Long-term (chronic) heart and lung problems.  Difficulty immediately after birth with regulating body systems, including blood sugar, body temperature, heart rate, and breathing rate.  Bleeding in the brain.  Cerebral palsy.  Learning difficulties.  Death. These risks are highest for babies who are born before 48 weeks of pregnancy. How is preterm labor treated? Treatment depends on the length of your pregnancy, your condition, and the health of your baby. It may involve: 1. Having a stitch (suture) placed in your cervix to prevent your cervix from opening too early (cerclage). 2. Taking or being given medicines, such as: ? Hormone medicines. These may be given early in pregnancy to help support the pregnancy. ? Medicine to stop contractions. ? Medicines to help mature the baby's lungs. These may be prescribed if the risk of delivery is high. ? Medicines to prevent your baby from  developing cerebral palsy. If the labor happens before 34 weeks of pregnancy, you may need to stay in the hospital. What should I do if I think I am in preterm labor? If you think that you are going into preterm labor, call your health care provider right away. How can I prevent preterm labor in future pregnancies? To increase your chance of having a full-term pregnancy:  Do not use any tobacco products, such as cigarettes, chewing tobacco, and e-cigarettes. If you need help quitting, ask your health care provider.  Do not use street drugs or medicines that have not been prescribed to you during your pregnancy.  Talk with your health care provider before taking any herbal supplements, even if you have been taking them regularly.  Make sure you gain a healthy amount of weight during your pregnancy.  Watch for infection. If you think that you might have an infection, get it checked right away.  Make sure to  tell your health care provider if you have gone into preterm labor before. This information is not intended to replace advice given to you by your health care provider. Make sure you discuss any questions you have with your health care provider. Document Revised: 04/27/2018 Document Reviewed: 05/27/2015 Elsevier Patient Education  [place].   Sciatica Rehab Ask your health care provider which exercises are safe for you. Do exercises exactly as told by your health care provider and adjust them as directed. It is normal to feel mild stretching, pulling, tightness, or discomfort as you do these exercises. Stop right away if you feel sudden pain or your pain gets worse. Do not begin these exercises until told by your health care provider. Stretching and range-of-motion exercises These exercises warm up your muscles and joints and improve the movement and flexibility of your hips and back. These exercises also help to relieve pain, numbness, and tingling. Sciatic nerve glide 1. Sit  in a chair with your head facing down toward your chest. Place your hands behind your back. Let your shoulders slump forward. 2. Slowly straighten one of your legs while you tilt your head back as if you are looking toward the ceiling. Only straighten your leg as far as you can without making your symptoms worse. 3. Hold this position for __________ seconds. 4. Slowly return to the starting position. 5. Repeat with your other leg. Repeat __________ times. Complete this exercise __________ times a day. Knee to chest with hip adduction and internal rotation  1. Lie on your back on a firm surface with both legs straight. 2. Bend one of your knees and move it up toward your chest until you feel a gentle stretch in your lower back and buttock. Then, move your knee toward the shoulder that is on the opposite side from your leg. This is hip adduction and internal rotation. ? Hold your leg in this position by holding on to the front of your knee. 3. Hold this position for __________ seconds. 4. Slowly return to the starting position. 5. Repeat with your other leg. Repeat __________ times. Complete this exercise __________ times a day. Prone extension on elbows  1. Lie on your abdomen on a firm surface. A bed may be too soft for this exercise. 2. Prop yourself up on your elbows. 3. Use your arms to help lift your chest up until you feel a gentle stretch in your abdomen and your lower back. ? This will place some of your body weight on your elbows. If this is uncomfortable, try stacking pillows under your chest. ? Your hips should stay down, against the surface that you are lying on. Keep your hip and back muscles relaxed. 4. Hold this position for __________ seconds. 5. Slowly relax your upper body and return to the starting position. Repeat __________ times. Complete this exercise __________ times a day. Strengthening exercises These exercises build strength and endurance in your back. Endurance is  the ability to use your muscles for a long time, even after they get tired. Pelvic tilt This exercise strengthens the muscles that lie deep in the abdomen. 1. Lie on your back on a firm surface. Bend your knees and keep your feet flat on the floor. 2. Tense your abdominal muscles. Tip your pelvis up toward the ceiling and flatten your lower back into the floor. ? To help with this exercise, you may place a small towel under your lower back and try to push your back into the  towel. 3. Hold this position for __________ seconds. 4. Let your muscles relax completely before you repeat this exercise. Repeat __________ times. Complete this exercise __________ times a day. Alternating arm and leg raises  1. Get on your hands and knees on a firm surface. If you are on a hard floor, you may want to use padding, such as an exercise mat, to cushion your knees. 2. Line up your arms and legs. Your hands should be directly below your shoulders, and your knees should be directly below your hips. 3. Lift your left leg behind you. At the same time, raise your right arm and straighten it in front of you. ? Do not lift your leg higher than your hip. ? Do not lift your arm higher than your shoulder. ? Keep your abdominal and back muscles tight. ? Keep your hips facing the ground. ? Do not arch your back. ? Keep your balance carefully, and do not hold your breath. 4. Hold this position for __________ seconds. 5. Slowly return to the starting position. 6. Repeat with your right leg and your left arm. Repeat __________ times. Complete this exercise __________ times a day. Posture and body mechanics Good posture and healthy body mechanics can help to relieve stress in your body's tissues and joints. Body mechanics refers to the movements and positions of your body while you do your daily activities. Posture is part of body mechanics. Good posture means:  Your spine is in its natural S-curve position  (neutral).  Your shoulders are pulled back slightly.  Your head is not tipped forward. Follow these guidelines to improve your posture and body mechanics in your everyday activities. Standing   When standing, keep your spine neutral and your feet about hip width apart. Keep a slight bend in your knees. Your ears, shoulders, and hips should line up.  When you do a task in which you stand in one place for a long time, place one foot up on a stable object that is 2-4 inches (5-10 cm) high, such as a footstool. This helps keep your spine neutral. Sitting   When sitting, keep your spine neutral and keep your feet flat on the floor. Use a footrest, if necessary, and keep your thighs parallel to the floor. Avoid rounding your shoulders, and avoid tilting your head forward.  When working at a desk or a computer, keep your desk at a height where your hands are slightly lower than your elbows. Slide your chair under your desk so you are close enough to maintain good posture.  When working at a computer, place your monitor at a height where you are looking straight ahead and you do not have to tilt your head forward or downward to look at the screen. Resting  When lying down and resting, avoid positions that are most painful for you.  If you have pain with activities such as sitting, bending, stooping, or squatting, lie in a position in which your body does not bend very much. For example, avoid curling up on your side with your arms and knees near your chest (fetal position).  If you have pain with activities such as standing for a long time or reaching with your arms, lie with your spine in a neutral position and bend your knees slightly. Try the following positions: ? Lying on your side with a pillow between your knees. ? Lying on your back with a pillow under your knees. Lifting   When lifting objects, keep your feet  at least shoulder width apart and tighten your abdominal muscles.  Bend  your knees and hips and keep your spine neutral. It is important to lift using the strength of your legs, not your back. Do not lock your knees straight out.  Always ask for help to lift heavy or awkward objects. This information is not intended to replace advice given to you by your health care provider. Make sure you discuss any questions you have with your health care provider. Document Revised: 04/27/2018 Document Reviewed: 01/25/2018 Elsevier Patient Education  [place].

## 2019-06-03 NOTE — Progress Notes (Signed)
LOW-RISK PREGNANCY VISIT Patient name: Deanna Hughes MRN [id number]  Date of birth: [birth year] Chief Complaint:   Routine Prenatal Visit  History of Present Illness:   Deanna Hughes is a 29 y.o. [id number] female at [redacted]w[redacted]d with an Estimated Date of Delivery: 07/10/19 being seen today for ongoing management of a low-risk pregnancy.  Depression screen [facility] 2/9 02/06/2019 12/31/2018 10/10/2018 10/02/2018 07/12/2018  Decreased Interest 0 1 0 0 0  Down, Depressed, Hopeless 0 1 0 1 0  PHQ - 2 Score 0 2 0 1 0  Altered sleeping - 0 - 0 0  Tired, decreased energy - 1 - 3 0  Change in appetite - 2 - 2 0  Feeling bad or failure about yourself  - 0 - 2 0  Trouble concentrating - 0 - 0 0  Moving slowly or fidgety/restless - 0 - 0 0  Suicidal thoughts - 0 - 0 0  PHQ-9 Score - 5 - 8 0  Difficult doing work/chores - - - Very difficult -  Some recent data might be hidden    Today she reports sciatica. Went to hospital, passed out at work- was told everything was normal- had only eaten fruit that am- no protein. 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 Contractions: Irregular. Vag. Bleeding: None.  Movement: Present. denies leaking of fluid. Review of Systems:   Pertinent items are noted in HPI Denies abnormal vaginal discharge w/ itching/odor/irritation, headaches, visual changes, shortness of breath, chest pain, abdominal pain, severe nausea/vomiting, or problems with urination or bowel movements unless otherwise stated above. Pertinent History Reviewed:  Reviewed past medical,surgical, social, obstetrical and family history.  Reviewed problem list, medications and allergies. Physical Assessment:   Vitals:   06/03/19 1059  BP: 102/66  Pulse: 91  Weight: 173 lb (78.5 kg)  Body mass index is 31.64 kg/m.        Physical Examination:   General appearance: Well appearing, and in no distress  Mental status: Alert, oriented to person, place, and  time  Skin: Warm & dry  Cardiovascular: Normal heart rate noted  Respiratory: Normal respiratory effort, no distress  Abdomen: Soft, gravid, nontender  Pelvic: Cervical exam deferred         Extremities: Edema: Trace, spider/varicose veins Lt lower extremity  Fetal Status:     Movement: Present    Chaperone: n/a    Results for orders placed or performed in visit on 06/03/19 (from the past 24 hour(s))  POC Urinalysis Dipstick OB   Collection Time: 06/03/19 10:58 AM  Result Value Ref Range   Color, UA     Clarity, UA     Glucose, UA Negative Negative   Bilirubin, UA     Ketones, UA n    Spec Grav, UA     Blood, UA n    pH, UA     POC,PROTEIN,UA Negative Negative, Trace, Small (1+), Moderate (2+), Large (3+), 4+   Urobilinogen, UA     Nitrite, UA n    Leukocytes, UA Negative Negative   Appearance     Odor      Assessment & Plan:  1) Low-risk pregnancy [id number] at [redacted]w[redacted]d with an Estimated Date of Delivery: 07/10/19   2) Sciatica, gave printed exercises   Meds: No orders of the defined types were placed in this encounter.  Labs/procedures today: none  Plan:  Continue routine obstetrical care  Next visit: prefers in  person    Reviewed: Preterm labor symptoms and general obstetric precautions including but not limited to vaginal bleeding, contractions, leaking of fluid and fetal movement were reviewed in detail with the patient.  All questions were answered.   Follow-up: Return in about 2 weeks (around 06/17/2019) for [place], [place], in person.  Orders Placed This Encounter  Procedures  . POC Urinalysis Dipstick OB   Roma Schanz CNM, [facility] 06/03/2019 11:23 AM

## 2019-06-10 ENCOUNTER — Telehealth: Payer: Self-pay | Admitting: Advanced Practice Midwife

## 2019-06-10 NOTE — Telephone Encounter (Signed)
Russell B. Call back [phone] is her direct number has some questions regarding patients fmla/short term dis paperwork.

## 2019-06-20 ENCOUNTER — Other Ambulatory Visit (HOSPITAL_COMMUNITY)
Admission: RE | Admit: 2019-06-20 | Discharge: 2019-06-20 | Disposition: A | Discharge status: Home / Self Care | Payer: 59 | Source: Ambulatory Visit | Attending: Advanced Practice Midwife | Admitting: Advanced Practice Midwife

## 2019-06-20 ENCOUNTER — Ambulatory Visit (INDEPENDENT_AMBULATORY_CARE_PROVIDER_SITE_OTHER): Payer: 59 | Admitting: Advanced Practice Midwife

## 2019-06-20 VITALS — BP 109/71 | HR 99 | Wt 174.0 lb

## 2019-06-20 DIAGNOSIS — Z1389 Encounter for screening for other disorder: Secondary | ICD-10-CM

## 2019-06-20 DIAGNOSIS — Z113 Encounter for screening for infections with a predominantly sexual mode of transmission: Secondary | ICD-10-CM | POA: Insufficient documentation

## 2019-06-20 DIAGNOSIS — Z3A37 37 weeks gestation of pregnancy: Secondary | ICD-10-CM

## 2019-06-20 DIAGNOSIS — Z3483 Encounter for supervision of other normal pregnancy, third trimester: Secondary | ICD-10-CM | POA: Diagnosis not present

## 2019-06-20 DIAGNOSIS — L299 Pruritus, unspecified: Secondary | ICD-10-CM

## 2019-06-20 DIAGNOSIS — Z331 Pregnant state, incidental: Secondary | ICD-10-CM

## 2019-06-20 LAB — POCT URINALYSIS DIPSTICK OB
Blood, UA: NEGATIVE
Glucose, UA: NEGATIVE
Ketones, UA: NEGATIVE
Nitrite, UA: NEGATIVE

## 2019-06-20 MED ORDER — FLUTICASONE PROPIONATE 50 MCG/ACT NA SUSP: 1.0000 | Freq: Every day | NASAL | 2 refills | Status: DC

## 2019-06-20 NOTE — Progress Notes (Signed)
   LOW-RISK PREGNANCY VISIT Patient name: Deanna Hughes MRN [id number]  Date of birth: 1990/06/21 Chief Complaint:   Routine Prenatal Visit  History of Present Illness:   Deanna Hughes is a 29 y.o. [id number] female at [redacted]w[redacted]d with an Estimated Date of Delivery: 07/10/19 being seen today for ongoing management of a low-risk pregnancy.  Today she reports palms/feet itching, occ blurred vision and "feeling off". Contractions: Irregular. Vag. Bleeding: None.  Movement: Present. denies leaking of fluid. Review of Systems:   Pertinent items are noted in HPI Denies abnormal vaginal discharge w/ itching/odor/irritation, headaches, visual changes, shortness of breath, chest pain, abdominal pain, severe nausea/vomiting, or problems with urination or bowel movements unless otherwise stated above. Pertinent History Reviewed:  Reviewed past medical,surgical, social, obstetrical and family history.  Reviewed problem list, medications and allergies. Physical Assessment:   Vitals:   06/20/19 1113  BP: 109/71  Pulse: 99  Weight: 174 lb (78.9 kg)  Body mass index is 31.83 kg/m.        Physical Examination:   General appearance: Well appearing, and in no distress  Mental status: Alert, oriented to person, place, and time  Skin: Warm & dry  Cardiovascular: Normal heart rate noted  Respiratory: Normal respiratory effort, no distress  Abdomen: Soft, gravid, nontender  Pelvic: Cervical exam performed         Extremities: Edema: Trace  Fetal Status:   Fundal Height: 37 cm Movement: Present    Chaperone: Catie Conley [place], SNP    Results for orders placed or performed in visit on 06/20/19 (from the past 24 hour(s))  POC Urinalysis Dipstick OB   Collection Time: 06/20/19 11:13 AM  Result Value Ref Range   Color, UA     Clarity, UA     Glucose, UA Negative Negative   Bilirubin, UA     Ketones, UA n    Spec Grav, UA     Blood, UA n    pH, UA     POC,PROTEIN,UA Trace Negative, Trace, Small (1+),  Moderate (2+), Large (3+), 4+   Urobilinogen, UA     Nitrite, UA n    Leukocytes, UA Trace (A) Negative   Appearance     Odor      Assessment & Plan:  1) Low-risk pregnancy [id number] at [redacted]w[redacted]d with an Estimated Date of Delivery: 07/10/19   2) itching, check bile acids in the am when fasting.  Told to call L&D if she gets results and they are >10. Will plan IOL if that's the case.    Meds: No orders of the defined types were placed in this encounter.  Labs/procedures today: GBS/GC/CHL  Plan:  Continue routine obstetrical care  Next visit: prefers in person    Reviewed: Term labor symptoms and general obstetric precautions including but not limited to vaginal bleeding, contractions, leaking of fluid and fetal movement were reviewed in detail with the patient.  All questions were answered. Has home bp cuff. Check bp weekly, let us know if >140/90.   Follow-up: Return in about 1 week (around 06/27/2019) for [place].  Orders Placed This Encounter  Procedures  . Strep Gp B NAA  . Bile acids, total  . POC Urinalysis Dipstick OB   Christin Fudge DNP, CNM 06/20/2019 11:48 AM

## 2019-06-20 NOTE — Patient Instructions (Addendum)
AM I IN LABOR? What is labor? Labor is the work that your body does to birth your baby. Your uterus (the womb) contracts. Your cervix (the mouth of the uterus) opens. You will push your baby out into the world.  What do contractions (labor pains) feel like? When they first start, contractions usually feel like cramps during your period. Sometimes you feel pain in your back. Most often, contractions feel like muscles pulling painfully in your lower belly. At first, the contractions will probably be 15 to 20 minutes apart. They will not feel too painful. As labor goes on, the contractions get stronger, closer together, and more painful.  How do I time the contractions? Time your contractions by counting the number of minutes from the start of one contraction to the start of the next contraction.  What should I do when the contractions start? If it is night and you can sleep, sleep. If it happens during the day, here are some things you can do to take care of yourself at home: ? Walk. If the pains you are having are real labor, walking will make the contractions come faster and harder. If the contractions are not going to continue and be real labor, walking will make the contractions slow down. ? Take a shower or bath. This will help you relax. ? Eat. Labor is a big event. It takes a lot of energy. ? Drink water. Not drinking enough water can cause false labor (contractions that hurt but do not open your cervix). If this is true labor, drinking water will help you have strength to get through your labor. ? Take a nap. Get all the rest you can. ? Get a massage. If your labor is in your back, a strong massage on your lower back may feel very good. Getting a foot massage is always good. ? Dont panic. You can do this. Your body was made for this. You are strong!  When should I go to the hospital or call my health care provider? ? Your contractions have been 5 minutes apart or less for at least 1  hour. ? If several contractions are so painful you cannot walk or talk during one. ? Your bag of waters breaks. (You may have a big gush of water or just water that runs down your legs when you walk.)  Are there other reasons to call my health care provider? Yes, you should call your health care provider or go to the hospital if you start to bleed like you are having a period-- blood that soaks your underwear or runs down your legs, if you have sudden severe pain, if your baby has not moved for several hours, or if you are leaking green fluid. The rule is as follows: If you are very concerned about something, call.   If your bile acids are more than 10, call [phone] and tell them that we told you to let a doctor know

## 2019-06-20 NOTE — Addendum Note (Signed)
Addended by: Christin Fudge on: 06/20/2019 02:58 PM   Modules accepted: Orders

## 2019-06-21 DIAGNOSIS — Z3483 Encounter for supervision of other normal pregnancy, third trimester: Secondary | ICD-10-CM | POA: Diagnosis not present

## 2019-06-21 DIAGNOSIS — L299 Pruritus, unspecified: Secondary | ICD-10-CM | POA: Diagnosis not present

## 2019-06-21 LAB — CERVICOVAGINAL ANCILLARY ONLY
Chlamydia: NEGATIVE
Comment: NEGATIVE
Comment: NORMAL
Neisseria Gonorrhea: NEGATIVE

## 2019-06-22 LAB — STREP GP B NAA: Strep Gp B NAA: NEGATIVE

## 2019-06-23 LAB — BILE ACIDS, TOTAL: Bile Acids Total: 3 umol/L (ref 0.0–10.0)

## 2019-06-27 ENCOUNTER — Encounter: Payer: 59 | Admitting: Women's Health

## 2019-06-28 ENCOUNTER — Ambulatory Visit (INDEPENDENT_AMBULATORY_CARE_PROVIDER_SITE_OTHER): Payer: 59 | Admitting: Nurse Practitioner

## 2019-06-28 VITALS — BP 112/72 | HR 99 | Wt 178.0 lb

## 2019-06-28 DIAGNOSIS — O368130 Decreased fetal movements, third trimester, not applicable or unspecified: Secondary | ICD-10-CM

## 2019-06-28 DIAGNOSIS — Z3A38 38 weeks gestation of pregnancy: Secondary | ICD-10-CM

## 2019-06-28 DIAGNOSIS — Z331 Pregnant state, incidental: Secondary | ICD-10-CM

## 2019-06-28 DIAGNOSIS — Z3483 Encounter for supervision of other normal pregnancy, third trimester: Secondary | ICD-10-CM

## 2019-06-28 DIAGNOSIS — Z1389 Encounter for screening for other disorder: Secondary | ICD-10-CM

## 2019-06-28 NOTE — Progress Notes (Signed)
    Subjective:  Deanna Hughes is a 29 y.o. [phone] at [redacted]w[redacted]d being seen today for ongoing prenatal care.  She is currently monitored for the following issues for this low-risk pregnancy and has Right inguinal hernia; Right ovarian cyst; Neck pain; Mid back pain; Other idiopathic scoliosis, thoracolumbar region; Internal hemorrhoids; Supervision of normal pregnancy; Migraine without aura and without status migrainosus, not intractable; Decreased appetite; and Umbilical hernia on their problem list.  Patient reports occasional contractions.  Having decreased fetal movement today.  Contractions: Irregular. Vag. Bleeding: None.  Movement: (!) Decreased. Denies leaking of fluid. Noticed decreased movement today.  Has not yet eaten breakfast.  The following portions of the patient's history were reviewed and updated as appropriate: allergies, current medications, past family history, past medical history, past social history, past surgical history and problem list. Problem list updated.  Objective:   Vitals:   06/28/19 0915  BP: 112/72  Pulse: 99  Weight: 178 lb (80.7 kg)    Fetal Status: Fetal Heart Rate (bpm): 145 Fundal Height: 40 cm Movement: (!) Decreased  Presentation: Vertex  General:  Alert, oriented and cooperative. Patient is in no acute distress.  Skin: Skin is warm and dry. No rash noted.   Cardiovascular: Normal heart rate noted  Respiratory: Normal respiratory effort, no problems with respiration noted  Abdomen: Soft, gravid, appropriate for gestational age. Pain/Pressure: Present     Pelvic:  Cervical exam performed Dilation: Fingertip   Station: Ballotable  Extremities: Normal range of motion.  Edema: Trace  Mental Status: Normal mood and affect. Normal behavior. Normal judgment and thought content.   Urinalysis:      Assessment and Plan:  Pregnancy: [id number] at [redacted]w[redacted]d  1. Encounter for supervision of other normal pregnancy in third trimester Having some contractions,  not painful Reviewed checking BP at home and to notify the office if readings are 140/90 or more (either reading)  2. Decreased fetal movements in third trimester, single or unspecified fetus NST in the office  - FHT baseline 130 with moderate variability.  Irregular contractions 4-6 minutes apart, no decelerations, 15x15 accels noted.  Category 1 tracing - reactive Has not eaten yet today States baby is not moving as much but did feel baby move once at home before coming to her appointment Advsed to eat small amounts every 3 hours. Drink at least 8 8-oz glasses of water every day. Advised that baby looks good today but to be aware of future decreased fetal movement and to notify the office if unable to feel appropriate movement even after increasing fluid intake and eating at regular intervals.  3. Screening for genitourinary condition  - POC Urinalysis Dipstick OB  4. Pregnancy, incidental  - POC Urinalysis Dipstick OB  5. [redacted] weeks gestation of pregnancy  - POC Urinalysis Dipstick OB  Term labor symptoms and general obstetric precautions including but not limited to vaginal bleeding, contractions, leaking of fluid and fetal movement were reviewed in detail with the patient. Please refer to After Visit Summary for other counseling recommendations.  Return in about 1 week (around 07/05/2019) for in person ROB.  Earlie Server, RN, MSN, NP-BC Nurse Practitioner, [facility] for [employer], [place] 06/28/2019 6:14 PM

## 2019-07-04 ENCOUNTER — Other Ambulatory Visit: Payer: Self-pay | Admitting: Advanced Practice Midwife

## 2019-07-05 ENCOUNTER — Ambulatory Visit (INDEPENDENT_AMBULATORY_CARE_PROVIDER_SITE_OTHER): Payer: 59 | Admitting: Obstetrics and Gynecology

## 2019-07-05 VITALS — BP 112/68 | HR 91 | Wt 179.6 lb

## 2019-07-05 DIAGNOSIS — Z331 Pregnant state, incidental: Secondary | ICD-10-CM

## 2019-07-05 DIAGNOSIS — Z3483 Encounter for supervision of other normal pregnancy, third trimester: Secondary | ICD-10-CM

## 2019-07-05 DIAGNOSIS — Z1389 Encounter for screening for other disorder: Secondary | ICD-10-CM

## 2019-07-05 DIAGNOSIS — Z3A39 39 weeks gestation of pregnancy: Secondary | ICD-10-CM

## 2019-07-05 LAB — POCT URINALYSIS DIPSTICK OB
Blood, UA: NEGATIVE
Glucose, UA: NEGATIVE
Ketones, UA: NEGATIVE
Nitrite, UA: NEGATIVE
POC,PROTEIN,UA: NEGATIVE

## 2019-07-05 NOTE — Progress Notes (Signed)
Patient ID: TSION INGHRAM, female   DOB: [birth year], 29 y.o.   MRN: [id number]   LOW-RISK PREGNANCY VISIT Patient name: Deanna Hughes MRN [id number]  Date of birth: [birth year] Chief Complaint:   Routine Prenatal Visit  History of Present Illness:   Deanna Hughes is a 29 y.o. [id number] female at [redacted]w[redacted]d with an Estimated Date of Delivery: 07/10/19 being seen today for ongoing management of a low-risk pregnancy.  Today she reports that she has been tired. The baby stays on the left side and causes her left sided abdominal pain.  Contractions: Irregular. Vag. Bleeding: None.  Movement: Present. denies leaking of fluid. Review of Systems:   Pertinent items are noted in HPI Denies abnormal vaginal discharge w/ itching/odor/irritation, headaches, visual changes, shortness of breath, chest pain, abdominal pain, severe nausea/vomiting, or problems with urination or bowel movements unless otherwise stated above. Pertinent History Reviewed:  Reviewed past medical,surgical, social, obstetrical and family history.  Reviewed problem list, medications and allergies. Physical Assessment:   Vitals:   07/05/19 1150  BP: 112/68  Pulse: 91  Weight: 179 lb 9.6 oz (81.5 kg)  Body mass index is 32.85 kg/m.        Physical Examination:   General appearance: Well appearing, and in no distress  Mental status: Alert, oriented to person, place, and time  Skin: Warm & dry  Cardiovascular: Normal heart rate noted  Respiratory: Normal respiratory effort, no distress  Abdomen: Soft, gravid, nontender  Pelvic: Cervical exam performed  Dilation: 2.5      Extremities: Edema: Trace  Fetal Status: Fetal Heart Rate (bpm): 175 Fundal Height: 42 cm Movement: Present    Results for orders placed or performed in visit on 07/05/19 (from the past 24 hour(s))  POC Urinalysis Dipstick OB   Collection Time: 07/05/19 11:52 AM  Result Value Ref Range   Color, UA     Clarity, UA     Glucose, UA Negative Negative    Bilirubin, UA     Ketones, UA neg    Spec Grav, UA     Blood, UA neg    pH, UA     POC,PROTEIN,UA Negative Negative, Trace, Small (1+), Moderate (2+), Large (3+), 4+   Urobilinogen, UA     Nitrite, UA neg    Leukocytes, UA Small (1+) (A) Negative   Appearance     Odor      Assessment & Plan:  1) Low-risk pregnancy [id number] at [redacted]w[redacted]d with an Estimated Date of Delivery: 07/10/19   2) Membranes swept today, 2.5 cm 20% -2 vertex  3 Hx LGA infant. X 2, 4225 gm at 34+9ZP, without complications noted in del note..07/21/2011   Plan:  Continue routine obstetrical care  Meds: No orders of the defined types were placed in this encounter.  Labs/procedures today: none  Reviewed: Term labor symptoms and general obstetric precautions including but not limited to vaginal bleeding, contractions, leaking of fluid and fetal movement were reviewed in detail with the patient.  All questions were answered  Follow-up: Return in 5 days (on 07/10/2019) for LROB, NST.  By signing my name below, I, De Burrs, attest that this documentation has been prepared under the direction and in the presence of Jonnie Kind, MD. Electronically Signed: De Burrs, Medical Scribe. 07/05/19. 12:06 PM.  I personally performed the services described in this documentation, which was SCRIBED in my presence. The recorded information has been reviewed and considered accurate. It has been edited as necessary during review. Jenny Reichmann  France Ravens, MD

## 2019-07-10 ENCOUNTER — Encounter (HOSPITAL_COMMUNITY): Payer: Self-pay | Admitting: Family Medicine

## 2019-07-10 ENCOUNTER — Inpatient Hospital Stay (HOSPITAL_COMMUNITY): Payer: 59 | Admitting: Anesthesiology

## 2019-07-10 ENCOUNTER — Other Ambulatory Visit: Payer: Self-pay

## 2019-07-10 ENCOUNTER — Inpatient Hospital Stay (HOSPITAL_COMMUNITY)
Admission: AD | Admit: 2019-07-10 | Discharge: 2019-07-12 | DRG: 807 | Disposition: A | Discharge status: Home / Self Care | Payer: 59 | Attending: Obstetrics and Gynecology | Admitting: Obstetrics and Gynecology

## 2019-07-10 ENCOUNTER — Other Ambulatory Visit: Payer: 59 | Admitting: Women's Health

## 2019-07-10 DIAGNOSIS — O99892 Other specified diseases and conditions complicating childbirth: Secondary | ICD-10-CM | POA: Diagnosis present

## 2019-07-10 DIAGNOSIS — M4125 Other idiopathic scoliosis, thoracolumbar region: Secondary | ICD-10-CM | POA: Diagnosis present

## 2019-07-10 DIAGNOSIS — O471 False labor at or after 37 completed weeks of gestation: Secondary | ICD-10-CM

## 2019-07-10 DIAGNOSIS — N898 Other specified noninflammatory disorders of vagina: Secondary | ICD-10-CM | POA: Diagnosis not present

## 2019-07-10 DIAGNOSIS — O26893 Other specified pregnancy related conditions, third trimester: Secondary | ICD-10-CM | POA: Diagnosis not present

## 2019-07-10 DIAGNOSIS — O479 False labor, unspecified: Secondary | ICD-10-CM

## 2019-07-10 DIAGNOSIS — Z20822 Contact with and (suspected) exposure to covid-19: Secondary | ICD-10-CM | POA: Diagnosis present

## 2019-07-10 DIAGNOSIS — O322XX0 Maternal care for transverse and oblique lie, not applicable or unspecified: Principal | ICD-10-CM | POA: Diagnosis present

## 2019-07-10 DIAGNOSIS — Z3483 Encounter for supervision of other normal pregnancy, third trimester: Secondary | ICD-10-CM

## 2019-07-10 DIAGNOSIS — R630 Anorexia: Secondary | ICD-10-CM

## 2019-07-10 DIAGNOSIS — Z3A40 40 weeks gestation of pregnancy: Secondary | ICD-10-CM

## 2019-07-10 LAB — TYPE AND SCREEN
ABO/RH(D): O POS
Antibody Screen: NEGATIVE

## 2019-07-10 LAB — CBC
HCT: 38.4 % (ref 36.0–46.0)
Hemoglobin: 12.3 g/dL (ref 12.0–15.0)
MCH: 28.5 pg (ref 26.0–34.0)
MCHC: 32 g/dL (ref 30.0–36.0)
MCV: 88.9 fL (ref 80.0–100.0)
Platelets: 139 10*3/uL — ABNORMAL LOW (ref 150–400)
RBC: 4.32 MIL/uL (ref 3.87–5.11)
RDW: 15.7 % — ABNORMAL HIGH (ref 11.5–15.5)
WBC: 6.9 10*3/uL (ref 4.0–10.5)
nRBC: 0 % (ref 0.0–0.2)

## 2019-07-10 LAB — ABO/RH: ABO/RH(D): O POS

## 2019-07-10 LAB — POCT FERN TEST: POCT Fern Test: NEGATIVE

## 2019-07-10 LAB — SARS CORONAVIRUS 2 BY RT PCR (HOSPITAL ORDER, PERFORMED IN ~~LOC~~ HOSPITAL LAB): SARS Coronavirus 2: NEGATIVE

## 2019-07-10 LAB — RPR: RPR Ser Ql: NONREACTIVE

## 2019-07-10 MED ORDER — SIMETHICONE 80 MG PO CHEW: 80.0000 mg | CHEWABLE_TABLET | ORAL | Status: DC | PRN

## 2019-07-10 MED ORDER — FENTANYL-BUPIVACAINE-NACL 0.5-0.125-0.9 MG/250ML-% EP SOLN
12.0000 mL/h | EPIDURAL | Status: DC | PRN
  Filled 2019-07-10: qty 250

## 2019-07-10 MED ORDER — EPHEDRINE 5 MG/ML INJ: 10.0000 mg | INTRAVENOUS | Status: DC | PRN

## 2019-07-10 MED ORDER — COCONUT OIL OIL: 1.0000 "application " | TOPICAL_OIL | Status: DC | PRN

## 2019-07-10 MED ORDER — ACETAMINOPHEN 325 MG PO TABS
650.0000 mg | ORAL_TABLET | ORAL | Status: DC | PRN
  Administered 2019-07-10: 650 mg via ORAL
  Filled 2019-07-10: qty 2

## 2019-07-10 MED ORDER — LACTATED RINGERS IV SOLN
500.0000 mL | INTRAVENOUS | Status: DC | PRN
  Administered 2019-07-10: 500 mL via INTRAVENOUS

## 2019-07-10 MED ORDER — OXYTOCIN BOLUS FROM INFUSION
333.0000 mL | Freq: Once | INTRAVENOUS | Status: AC
  Administered 2019-07-10: 333 mL via INTRAVENOUS

## 2019-07-10 MED ORDER — WITCH HAZEL-GLYCERIN EX PADS: 1.0000 "application " | MEDICATED_PAD | CUTANEOUS | Status: DC | PRN

## 2019-07-10 MED ORDER — DIBUCAINE (PERIANAL) 1 % EX OINT: 1.0000 "application " | TOPICAL_OINTMENT | CUTANEOUS | Status: DC | PRN

## 2019-07-10 MED ORDER — PRENATAL MULTIVITAMIN CH
1.0000 | ORAL_TABLET | Freq: Every day | ORAL | Status: DC
  Administered 2019-07-12: 1 via ORAL
  Filled 2019-07-10: qty 1

## 2019-07-10 MED ORDER — BUPIVACAINE HCL (PF) 0.25 % IJ SOLN
INTRAMUSCULAR | Status: DC | PRN
  Administered 2019-07-10: 10 mL via EPIDURAL

## 2019-07-10 MED ORDER — OXYTOCIN-SODIUM CHLORIDE 30-0.9 UT/500ML-% IV SOLN
1.0000 m[IU]/min | INTRAVENOUS | Status: DC
  Administered 2019-07-10: 4 m[IU]/min via INTRAVENOUS
  Administered 2019-07-10: 2 m[IU]/min via INTRAVENOUS
  Filled 2019-07-10: qty 500

## 2019-07-10 MED ORDER — ONDANSETRON HCL 4 MG PO TABS: 4.0000 mg | ORAL_TABLET | ORAL | Status: DC | PRN

## 2019-07-10 MED ORDER — SOD CITRATE-CITRIC ACID 500-334 MG/5ML PO SOLN: 30.0000 mL | ORAL | Status: DC | PRN

## 2019-07-10 MED ORDER — OXYCODONE-ACETAMINOPHEN 5-325 MG PO TABS: 1.0000 | ORAL_TABLET | ORAL | Status: DC | PRN

## 2019-07-10 MED ORDER — ONDANSETRON HCL 4 MG/2ML IJ SOLN
4.0000 mg | Freq: Four times a day (QID) | INTRAMUSCULAR | Status: DC | PRN
  Administered 2019-07-10: 4 mg via INTRAVENOUS
  Filled 2019-07-10: qty 2

## 2019-07-10 MED ORDER — ONDANSETRON HCL 4 MG/2ML IJ SOLN: 4.0000 mg | INTRAMUSCULAR | Status: DC | PRN

## 2019-07-10 MED ORDER — LIDOCAINE HCL (PF) 1 % IJ SOLN: 30.0000 mL | INTRAMUSCULAR | Status: DC | PRN

## 2019-07-10 MED ORDER — BENZOCAINE-MENTHOL 20-0.5 % EX AERO: 1.0000 "application " | INHALATION_SPRAY | CUTANEOUS | Status: DC | PRN

## 2019-07-10 MED ORDER — LACTATED RINGERS IV SOLN: INTRAVENOUS | Status: DC

## 2019-07-10 MED ORDER — SODIUM CHLORIDE (PF) 0.9 % IJ SOLN
INTRAMUSCULAR | Status: DC | PRN
  Administered 2019-07-10: 12 mL/h via EPIDURAL

## 2019-07-10 MED ORDER — OXYCODONE-ACETAMINOPHEN 5-325 MG PO TABS: 2.0000 | ORAL_TABLET | ORAL | Status: DC | PRN

## 2019-07-10 MED ORDER — LIDOCAINE HCL (PF) 1 % IJ SOLN
INTRAMUSCULAR | Status: DC | PRN
  Administered 2019-07-10: 11 mL via EPIDURAL

## 2019-07-10 MED ORDER — OXYTOCIN-SODIUM CHLORIDE 30-0.9 UT/500ML-% IV SOLN: 2.5000 [IU]/h | INTRAVENOUS | Status: DC

## 2019-07-10 MED ORDER — SENNOSIDES-DOCUSATE SODIUM 8.6-50 MG PO TABS
2.0000 | ORAL_TABLET | ORAL | Status: DC
  Administered 2019-07-10 – 2019-07-12 (×2): 2 via ORAL
  Filled 2019-07-10 (×2): qty 2

## 2019-07-10 MED ORDER — PHENYLEPHRINE 40 MCG/ML (10ML) SYRINGE FOR IV PUSH (FOR BLOOD PRESSURE SUPPORT)
80.0000 ug | PREFILLED_SYRINGE | INTRAVENOUS | Status: DC | PRN
  Filled 2019-07-10: qty 10

## 2019-07-10 MED ORDER — DIPHENHYDRAMINE HCL 50 MG/ML IJ SOLN: 12.5000 mg | INTRAMUSCULAR | Status: DC | PRN

## 2019-07-10 MED ORDER — TERBUTALINE SULFATE 1 MG/ML IJ SOLN: 0.2500 mg | Freq: Once | INTRAMUSCULAR | Status: DC | PRN

## 2019-07-10 MED ORDER — DIPHENHYDRAMINE HCL 25 MG PO CAPS: 25.0000 mg | ORAL_CAPSULE | Freq: Four times a day (QID) | ORAL | Status: DC | PRN

## 2019-07-10 MED ORDER — PHENYLEPHRINE 40 MCG/ML (10ML) SYRINGE FOR IV PUSH (FOR BLOOD PRESSURE SUPPORT)
80.0000 ug | PREFILLED_SYRINGE | INTRAVENOUS | Status: DC | PRN
  Administered 2019-07-10 (×2): 80 ug via INTRAVENOUS

## 2019-07-10 MED ORDER — LACTATED RINGERS IV SOLN
500.0000 mL | Freq: Once | INTRAVENOUS | Status: AC
  Administered 2019-07-10: 500 mL via INTRAVENOUS

## 2019-07-10 NOTE — Anesthesia Procedure Notes (Signed)
Epidural Patient location during procedure: OB Start time: 07/10/2019 12:28 PM End time: 07/10/2019 12:33 PM  Staffing Anesthesiologist: Lynda Rainwater, MD Performed: anesthesiologist   Preanesthetic Checklist Completed: patient identified, IV checked, site marked, risks and benefits discussed, surgical consent, monitors and equipment checked, pre-op evaluation and timeout performed  Epidural Patient position: sitting Prep: ChloraPrep Patient monitoring: heart rate, cardiac monitor, continuous pulse ox and blood pressure Approach: midline Location: L2-L3 Injection technique: LOR saline  Needle:  Needle type: Tuohy  Needle gauge: 17 G Needle length: 9 cm Needle insertion depth: 5 cm Catheter type: closed end flexible Catheter size: 20 Guage Catheter at skin depth: 9 cm Test dose: negative  Assessment Events: blood not aspirated, injection not painful, no injection resistance, no paresthesia and negative IV test  Additional Notes Epidural placed by SRNA under direct supervisionReason for block:procedure for pain

## 2019-07-10 NOTE — MAU Provider Note (Signed)
Chief Complaint:  Rupture of Membranes   Seen by provider [phone]   HPI: SOUMYA Hughes is a 29 y.o. [phone] at 67w0dwho presents to maternity admissions reporting leaking of water from vagina since 0400.  Started having contractions about the same time.  States is going to be scheduled for IOL tomorrow because baby is big. . She reports good fetal movement, denies vaginal bleeding, vaginal itching/burning, urinary symptoms, h/a, dizziness, n/v, diarrhea, constipation or fever/chills.  She denies headache, visual changes or RUQ abdominal pain.  Vaginal Discharge The patient's primary symptoms include pelvic pain and vaginal discharge. The patient's pertinent negatives include no genital itching, genital lesions, genital odor or vaginal bleeding. This is a new problem. The current episode started today. The problem occurs intermittently. The pain is mild. She is pregnant. Associated symptoms include abdominal pain. Pertinent negatives include no constipation, diarrhea, fever, headaches, nausea or vomiting. The vaginal discharge was watery and clear. There has been no bleeding. She has not been passing clots. She has not been passing tissue.    RN Note: Water broke at 4am, UC around 0430am, decrease FM, uncomplicated pregnancy  Past Medical History: Past Medical History:  Diagnosis Date  . Anxiety   . BV (bacterial vaginosis) 04/02/2015  . Chlamydia    treated 6/6  . Chronic back pain   . Chronic headaches    MIGRAINES  . Contraceptive management 01/25/2013  . Encounter for Nexplanon removal 02/27/2015  . Irregular bleeding 06/03/2013  . Miscarriage 03/27/2013  . Nexplanon insertion 04/11/2013   nexplanon inserted 04/11/13 remove 3/32/18  . Right sided abdominal pain   . Scoliosis   . Seasonal allergies 07/03/2012  . Sinus infection 08/27/2013  . Supervision of normal pregnancy 08/27/2015    [facility] Initiated Care at   7+5 weeks  FOB  Dating By  LMP  Pap  GC/CT Initial:                 36+wks: Genetic Screen NT/IT:  CF screen  Anatomic [place]  Flu vaccine  Tdap Recommended ~ 28wks Glucose Screen  2 hr GBS  Feed Preference  Contraception  Circumcision  Childbirth Classes  Pediatrician    . Threatened abortion in early pregnancy 03/20/2013  . Vaginal discharge 10/03/2012  . Yeast infection 07/03/2012    Past obstetric history: OB History  Gravida Para Term Preterm AB Living  6 3 3  0 2 3  SAB TAB Ectopic Multiple Live Births  2 0 0 0 3    # Outcome Date GA Lbr Len/2nd Weight Sex Delivery Anes PTL Lv  6 Current           5 SAB 05/06/16             Birth Comments: System Generated. Please review and update pregnancy details.  4 Term 04/10/16 [redacted]w[redacted]d 14:06 / 01:07 3575 g F Vag-Spont EPI  LIV  3 Term 07/21/11 [redacted]w[redacted]d 16:15 / 01:05 4224 g M Vag-Spont EPI N LIV     Birth Comments: No problems at birth  2 SAB 2012             Birth Comments: System Generated. Please review and update pregnancy details.  1 Term 02/22/09 [redacted]w[redacted]d 24:00 3459 g F Vag-Spont EPI N LIV    Obstetric Comments  1st Menstrual Cycle:  15   1st Pregnancy:  18    Past Surgical History: Past Surgical History:  Procedure Laterality Date  . BIOPSY  09/18/2018   Procedure: BIOPSY;  Surgeon: Danie Binder, MD;  [facility]: [facility];  Service: Endoscopy;;  . COLONOSCOPY WITH PROPOFOL N/A 09/18/2018   Procedure: COLONOSCOPY WITH PROPOFOL;  Surgeon: Danie Binder, MD;  Location: [facility];  Service: Endoscopy;  Laterality: N/A;  8:30am  . FLEXIBLE SIGMOIDOSCOPY N/A 10/05/2018   Procedure: FLEXIBLE SIGMOIDOSCOPY;  Surgeon: Danie Binder, MD;  Location: [facility];  Service: Endoscopy;  Laterality: N/A;  8:30  . HEMORRHOID BANDING N/A 10/05/2018   Procedure: Thayer Jew;  Surgeon: Danie Binder, MD;  Location: [facility];  Service: Endoscopy;  Laterality: N/A;  . HERNIA REPAIR    . INGUINAL HERNIA REPAIR Right 04/19/2017   Procedure: HERNIA REPAIR INGUINAL ADULT;  Surgeon: Robert Bellow, MD;   Location: [facility];  Service: General;  Laterality: Right;  . WISDOM TOOTH EXTRACTION      Family History: Family History  Problem Relation Age of Onset  . Asthma Mother   . Asthma Sister   . Asthma Brother   . Asthma Daughter   . Diabetes Maternal Grandmother   . Hypertension Maternal Grandmother   . Asthma Maternal Grandmother   . Heart disease Maternal Grandmother   . Stroke Maternal Grandmother   . Hypertension Maternal Grandfather   . Asthma Maternal Grandfather   . Heart disease Maternal Grandfather   . Asthma Son   . Colon cancer Neg Hx   . Colon polyps Neg Hx     Social History: Social History   Tobacco Use  . Smoking status: Never Smoker  . Smokeless tobacco: Never Used  Vaping Use  . Vaping Use: Never used  Substance Use Topics  . Alcohol use: No  . Drug use: No    Allergies:  Allergies  Allergen Reactions  . Aspirin Shortness Of Breath and Other (See Comments)    Heart beats fast  . Advil [Ibuprofen] Other (See Comments)    Breakout, heart palpitations and trouble breathing.   . Latex Rash    Meds:  Medications Prior to Admission  Medication Sig Dispense Refill Last Dose  . prenatal vitamin w/FE, FA (PRENATAL 1 + 1) 27-1 MG TABS tablet Take 1 tablet by mouth daily at 12 noon. 30 tablet 11 07/09/2019 at Unknown time  . fluticasone (FLONASE) 50 MCG/ACT nasal spray Place into both nostrils daily.     . fluticasone (FLONASE) 50 MCG/ACT nasal spray SHAKE LIQUID AND USE 1 SPRAY IN EACH NOSTRIL DAILY 16 g 2   . Misc. Devices MISC Dispense one maternity belt for patient 1 each 0     I have reviewed patient's Past Medical Hx, Surgical Hx, Family Hx, Social Hx, medications and allergies.   ROS:  Review of Systems  Constitutional: Negative for fever.  Gastrointestinal: Positive for abdominal pain. Negative for constipation, diarrhea, nausea and vomiting.  Genitourinary: Positive for pelvic pain and vaginal discharge.  Neurological: Negative for headaches.    Other systems negative  Physical Exam   Patient Vitals for the past 24 hrs:  BP Temp Temp src Pulse Resp Height Weight  07/10/19 0709 114/70 -- -- 84 -- -- --  07/10/19 0648 107/71 -- -- 91 -- -- --  07/10/19 0628 119/71 98.3 F (36.8 C) Oral 83 17 -- --  07/10/19 0624 -- -- -- -- -- 5\' 2"  (1.575 m) 83.5 kg   Constitutional: Well-developed, well-nourished female in no acute distress.  Cardiovascular: normal rate and rhythm Respiratory: normal effort, clear to auscultation bilaterally GI: Abd soft, non-tender, gravid  appropriate for gestational age.   No rebound or guarding. MS: Extremities nontender, no edema, normal ROM Neurologic: Alert and oriented x 4.  GU: Neg CVAT.  PELVIC EXAM: Cervix pink, visually closed, without lesion, scant mucous discharge, vaginal walls and external genitalia normal     NO pooling, NO ferning  Dilation: 3 Effacement (%): 70 Station: -3 Presentation: Vertex Exam by:: Hansel Feinstein, CNM  FHT:  Baseline 145 , moderate variability, accelerations present, no decelerations Contractions:  Irregular     Labs: Results for orders placed or performed during the hospital encounter of 07/10/19 (from the past 24 hour(s))  POCT fern test     Status: None   Collection Time: 07/10/19  7:00 AM  Result Value Ref Range   POCT Fern Test Negative = intact amniotic membranes    O/Positive/-- (12/14 1152)  Imaging:  No results found.  MAU Course/MDM: Exam is negative for ruptured membranes.  Will watch for an hour and recheck cervix. NST reviewed, reactive Category I  Treatments in MAU included EFM, SSE.    Assessment: Single IUP at [redacted]w[redacted]d Vaginal discharge in pregnancy Irregular uterine contractions   Plan: RN will complete labor eval  Hansel Feinstein CNM, MSN Certified Nurse-Midwife 07/10/2019 7:37 AM

## 2019-07-10 NOTE — H&P (Addendum)
OBSTETRIC ADMISSION HISTORY AND PHYSICAL  Deanna Hughes is a 29 y.o. female [phone] with IUP at [redacted]w[redacted]d by LMP presenting for SOL. She reports +FMs, No LOF, no VB, no blurry vision, headaches or peripheral edema, and RUQ pain.  She plans on breast feeding. She requests BTL for birth control. She received her prenatal care at [facility]   Dating: By LMP --->  Estimated Date of Delivery: 07/10/19  Sono:   @[redacted]w[redacted]d , CWD, normal anatomy, breech presentation, anterior placenta, 358g, 85% EFW   Prenatal History/Complications: -Migraine without aura  -Umbilical hernia   Past Medical History: Past Medical History:  Diagnosis Date   Anxiety    BV (bacterial vaginosis) 04/02/2015   Chlamydia    treated 6/6   Chronic back pain    Chronic headaches    MIGRAINES   Contraceptive management 01/25/2013   Encounter for Nexplanon removal 02/27/2015   Irregular bleeding 06/03/2013   Miscarriage 03/27/2013   Nexplanon insertion 04/11/2013   nexplanon inserted 04/11/13 remove 3/32/18   Right sided abdominal pain    Scoliosis    Seasonal allergies 07/03/2012   Sinus infection 08/27/2013   Supervision of normal pregnancy 08/27/2015    [facility] at   7+5 weeks  FOB  Dating By  LMP  Pap  GC/CT Initial:                36+wks: Genetic Screen NT/IT:  CF screen  Anatomic [place]  Flu vaccine  Tdap Recommended ~ 28wks Glucose Screen  2 hr GBS  Feed Preference  Contraception  Circumcision  Childbirth Classes  Pediatrician     Threatened abortion in early pregnancy 03/20/2013   Vaginal discharge 10/03/2012   Yeast infection 07/03/2012    Past Surgical History: Past Surgical History:  Procedure Laterality Date   BIOPSY  09/18/2018   Procedure: BIOPSY;  Surgeon: Danie Binder, MD;  [facility];  Service: Endoscopy;;   COLONOSCOPY WITH PROPOFOL N/A 09/18/2018   Procedure: COLONOSCOPY WITH PROPOFOL;  Surgeon: Danie Binder, MD;  [facility];  Service: Endoscopy;  Laterality:  N/A;  8:30am   FLEXIBLE SIGMOIDOSCOPY N/A 10/05/2018   Procedure: FLEXIBLE SIGMOIDOSCOPY;  Surgeon: Danie Binder, MD;  [facility];  Service: Endoscopy;  Laterality: N/A;  8:30   HEMORRHOID BANDING N/A 10/05/2018   Procedure: Thayer Jew;  Surgeon: Danie Binder, MD;  [facility];  Service: Endoscopy;  Laterality: N/A;   HERNIA REPAIR     INGUINAL HERNIA REPAIR Right 04/19/2017   Procedure: HERNIA REPAIR INGUINAL ADULT;  Surgeon: Robert Bellow, MD;  Location: [facility] ORS;  Service: General;  Laterality: Right;   WISDOM TOOTH EXTRACTION      Obstetrical History: OB History     Gravida  6   Para  3   Term  3   Preterm  0   AB  2   Living  3      SAB  2   TAB  0   Ectopic  0   Multiple  0   Live Births  3        Obstetric Comments  1st Menstrual Cycle:  15  1st Pregnancy:  18          Social History: Social History   Socioeconomic History   Marital status: Single    Spouse name: Not on file   Number of children: 3   Years of education: Not on file  Highest education level: High school graduate  Occupational History    Comment: Engineer, technical sales, cafeteria  Tobacco Use   Smoking status: Never Smoker   Smokeless tobacco: Never Used  Vaping Use   Vaping Use: Never used  Substance and Sexual Activity   Alcohol use: No   Drug use: No   Sexual activity: Not Currently    Birth control/protection: None  Other Topics Concern   Not on file  Social History Narrative   Lives with her 3 children      Zimiah (8)    Zatavious (6)      Zayla (1)- Daddy is present in life.       Social Determinants of Health   Financial Resource Strain:    Difficulty of Paying Living Expenses:   Food Insecurity:    Worried About Charity fundraiser in the Last Year:    Arboriculturist in the Last Year:   Transportation Needs:    Film/video editor (Medical):    Lack of Transportation (Non-Medical):   Physical Activity:    Days of  Exercise per Week:    Minutes of Exercise per Session:   Stress:    Feeling of Stress :   Social Connections:    Frequency of Communication with Friends and Family:    Frequency of Social Gatherings with Friends and Family:    Attends Religious Services:    Active Member of Clubs or Organizations:    Attends Music therapist:    Marital Status:     Family History: Family History  Problem Relation Age of Onset   Asthma Mother    Asthma Sister    Asthma Brother    Asthma Daughter    Diabetes Maternal Grandmother    Hypertension Maternal Grandmother    Asthma Maternal Grandmother    Heart disease Maternal Grandmother    Stroke Maternal Grandmother    Hypertension Maternal Grandfather    Asthma Maternal Grandfather    Heart disease Maternal Grandfather    Asthma Son    Colon cancer Neg Hx    Colon polyps Neg Hx     Allergies: Allergies  Allergen Reactions   Aspirin Shortness Of Breath and Other (See Comments)    Heart beats fast   Advil [Ibuprofen] Other (See Comments)    Breakout, heart palpitations and trouble breathing.    Latex Rash    Medications Prior to Admission  Medication Sig Dispense Refill Last Dose   prenatal vitamin w/FE, FA (PRENATAL 1 + 1) 27-1 MG TABS tablet Take 1 tablet by mouth daily at 12 noon. 30 tablet 11 07/09/2019 at Unknown time   fluticasone (FLONASE) 50 MCG/ACT nasal spray Place into both nostrils daily.      fluticasone (FLONASE) 50 MCG/ACT nasal spray SHAKE LIQUID AND USE 1 SPRAY IN EACH NOSTRIL DAILY 16 g 2    Misc. Devices MISC Dispense one maternity belt for patient 1 each 0      Review of Systems   All systems reviewed and negative except as stated in HPI  Blood pressure 112/69, pulse 80, temperature 98.4 F (36.9 C), temperature source Oral, resp. rate 18, height 5\' 2"  (1.575 m), weight 83.5 kg, last menstrual period 09/23/2018, SpO2 100 %. General appearance: alert, cooperative, appears stated age and no  distress Lungs: normal effort Heart: regular rate  Abdomen: soft, non-tender; bowel sounds normal Pelvic: gravid uterus Extremities: Homans sign is negative, no sign of DVT Presentation: cephalic Fetal monitoringBaseline:  140 bpm, Variability: Good {> 6 bpm), Accelerations: Reactive and Decelerations: Absent Uterine activity: every 4-5 mins  Dilation: 6.5 Effacement (%): 70 Station: -2 Exam by:: cwhite,rnc  Prenatal labs: ABO, Rh: --/--/O POS, O POS Performed at [facility], [place] [address] [place], [place] [address]  [phone]/23 0914) Antibody: NEG (06/23 1540) Rubella: 6.41 (12/14 1152) RPR: NON REACTIVE (06/23 0913)  HBsAg: Negative (12/14 1152)  HIV: Non Reactive (03/29 0924)  GBS: Negative/-- (06/03 1330)  2 hr Glucola normal  Genetic screening  Normal Anatomy US normal, female   Prenatal Transfer Tool  Maternal Diabetes: No Genetic Screening: Normal Maternal Ultrasounds/Referrals: Normal Fetal Ultrasounds or other Referrals:  None Maternal Substance Abuse:  No Significant Maternal Medications:  None Significant Maternal Lab Results: Group B Strep negative  Results for orders placed or performed during the hospital encounter of 07/10/19 (from the past 24 hour(s))  POCT fern test   Collection Time: 07/10/19  7:00 AM  Result Value Ref Range   POCT Fern Test Negative = intact amniotic membranes   SARS Coronavirus 2 by RT PCR (hospital order, performed in [place] hospital lab) Nasopharyngeal Nasopharyngeal Swab   Collection Time: 07/10/19  9:06 AM   Specimen: Nasopharyngeal Swab  Result Value Ref Range   SARS Coronavirus 2 NEGATIVE NEGATIVE  CBC   Collection Time: 07/10/19  9:13 AM  Result Value Ref Range   WBC 6.9 4.0 - 10.5 K/uL   RBC 4.32 3.87 - 5.11 MIL/uL   Hemoglobin 12.3 12.0 - 15.0 g/dL   HCT 38.4 36 - 46 %   MCV 88.9 80.0 - 100.0 fL   MCH 28.5 26.0 - 34.0 pg   MCHC 32.0 30.0 - 36.0 g/dL   RDW 15.7 (H) 11.5 - 15.5 %   Platelets 139 (L) 150 -  400 K/uL   nRBC 0.0 0.0 - 0.2 %  RPR   Collection Time: 07/10/19  9:13 AM  Result Value Ref Range   RPR Ser Ql NON REACTIVE NON REACTIVE  Type and screen [place]   Collection Time: 07/10/19  9:14 AM  Result Value Ref Range   ABO/RH(D) O POS    Antibody Screen NEG    Sample Expiration      07/13/2019,2359 Performed at [facility], [address], [place], [place] [address]   ABO/Rh   Collection Time: 07/10/19  9:14 AM  Result Value Ref Range   ABO/RH(D)      O POS Performed at [place] [address], [place], [place] [address]     Patient Active Problem List   Diagnosis Date Noted   Labor and delivery, indication for care 32/67/1245   Umbilical hernia 80/99/8338   Decreased appetite 02/18/2019   Migraine without aura and without status migrainosus, not intractable 02/11/2019   Supervision of normal pregnancy 12/31/2018   Internal hemorrhoids    Neck pain 05/17/2018   Mid back pain 05/17/2018   Other idiopathic scoliosis, thoracolumbar region 05/17/2018   Right inguinal hernia 12/14/2015    Assessment/Plan:  RYLI STANDLEE is a 29 y.o. [id number] at [redacted]w[redacted]d here for  early labor at term  #Labor:expectant management, anticipate vaginal delivery  #Pain: Epidural  #FWB: Cat I ; # EFW: 3300g #ID:  GBS negative  #MOF: breast #MOC: BTL #Circ:  N/A  Eulis Foster, MD Family Medicine, PGY-1 07/10/2019, 1:14 PM   Attestation of Supervision of Student:  I confirm that I have verified the information  documented in the  resident  student's note and that I have also personally reperformed the history, physical exam and all medical decision making activities.  I have verified that all services and findings are accurately documented in this student's note; and I agree with management and plan as outlined in the documentation. I have also made any necessary editorial changes.  Patient desires augmentation with pitocin. Will get  epidural and start pit 2x2. AROM when able  Wende Mott, [place] for [employer], [place] 07/10/2019 1:58 PM

## 2019-07-10 NOTE — Discharge Summary (Signed)
Postpartum Discharge Summary     Patient Name: Deanna Hughes DOB: 1990-11-13 MRN: [id number]  Date of admission: 07/10/2019 Delivery date:07/10/2019  Delivering provider: Pattricia Boss  Date of discharge: 07/12/2019  Admitting diagnosis: Labor and delivery, indication for care [O75.9] Intrauterine pregnancy: [redacted]w[redacted]d    Secondary diagnosis:  Active Problems:   Labor and delivery, indication for care  Additional problems: none    Discharge diagnosis: Term Pregnancy Delivered                                              Post partum procedures:none Augmentation: Pitocin Complications: None  Hospital course: Onset of Labor With Vaginal Delivery      29y.o. yo [id number] [id number] admitted in Latent Labor on 07/10/2019. Patient had an uncomplicated labor course as follows:  Membrane Rupture Time/Date: 3:47 PM ,07/10/2019   Delivery Method:Vaginal, Spontaneous  Episiotomy: None  Lacerations:  None  Patient had an uncomplicated postpartum course.  She is ambulating, tolerating a regular diet, passing flatus, and urinating well. Patient is discharged home in stable condition on 07/12/19.  Newborn Data: Birth date:07/10/2019  Birth time:6:34 PM  Gender:Female  Living status:Living  Apgars:9 ,9  Weight:4394 g   Magnesium Sulfate received: No BMZ received: No Rhophylac:N/A MMR:No T-DaP:Given prenatally Flu: N/A Transfusion:No  Physical exam  Vitals:   07/11/19 1402 07/11/19 1447 07/11/19 2211 07/12/19 0436  BP: 104/65 118/74 106/64 106/76  Pulse: 66 77 69 73  Resp: '18 18 18 18  '$ Temp: 98.2 F (36.8 C) 97.9 F (36.6 C) 98.6 F (37 C) 98.6 F (37 C)  TempSrc: Oral Oral Oral Oral  SpO2: 100%     Weight:      Height:       General: alert, cooperative and no distress Lochia: appropriate Uterine Fundus: firm Incision: N/A DVT Evaluation: No evidence of DVT seen on physical exam. Negative Homan's sign. No cords or calf tenderness. Labs: Lab Results  Component  Value Date   WBC 6.9 07/10/2019   HGB 12.3 07/10/2019   HCT 38.4 07/10/2019   MCV 88.9 07/10/2019   PLT 139 (L) 07/10/2019   CMP Latest Ref Rng & Units 08/03/2018  Glucose 65 - 139 mg/dL 82  BUN 7 - 25 mg/dL 10  Creatinine 0.50 - 1.10 mg/dL 0.63  Sodium 135 - 146 mmol/L 137  Potassium 3.5 - 5.3 mmol/L 4.2  Chloride 98 - 110 mmol/L 105  CO2 20 - 32 mmol/L 26  Calcium 8.6 - 10.2 mg/dL 9.6  Total Protein 6.1 - 8.1 g/dL 6.9  Total Bilirubin 0.2 - 1.2 mg/dL 0.6  Alkaline Phos 38 - 126 U/L -  AST 10 - 30 U/L 11  ALT 6 - 29 U/L 11   Edinburgh Score: Edinburgh Postnatal Depression Scale Screening Tool 07/12/2019  I have been able to laugh and see the funny side of things. 0  I have looked forward with enjoyment to things. 0  I have blamed myself unnecessarily when things went wrong. 0  I have been anxious or worried for no good reason. 0  I have felt scared or panicky for no good reason. 0  Things have been getting on top of me. 0  I have been so unhappy that I have had difficulty sleeping. 0  I have felt sad or miserable. 0  I have  been so unhappy that I have been crying. 0  The thought of harming myself has occurred to me. 0  Edinburgh Postnatal Depression Scale Total 0     After visit meds:  Allergies as of 07/12/2019      Reactions   Aspirin Shortness Of Breath, Other (See Comments)   Heart beats fast   Advil [ibuprofen] Other (See Comments)   Breakout, heart palpitations and trouble breathing.    Latex Rash      Medication List    STOP taking these medications   fluticasone 50 MCG/ACT nasal spray Commonly known as: FLONASE   Misc. Devices Misc   prenatal vitamin w/FE, FA 27-1 MG Tabs tablet     TAKE these medications   traMADol 50 MG tablet Commonly known as: Ultram Take 1 tablet (50 mg total) by mouth every 6 (six) hours as needed for severe pain.        Discharge home in stable condition Infant Feeding: routine diet Infant Disposition:home with  mother Discharge instruction: per After Visit Summary and Postpartum booklet. Activity: Advance as tolerated. Pelvic rest for 6 weeks.  Diet: routine diet Future Appointments: Future Appointments  Date Time Provider [place]  08/14/2019  1:30 PM Myrtis Ser, CNM [facility]   Follow up Visit:  Follow-up Information    [facility] Follow up on 08/14/2019.   Specialty: Obstetrics and Gynecology Why: for postpartum visit Contact information: [address] [place] [phone]               Please schedule this patient for a In person postpartum visit in 4 weeks with the following provider: Any provider. Additional Postpartum F/U:n/a  Low risk pregnancy complicated by: n/a Delivery mode:  Vaginal, Spontaneous  Anticipated Birth Control:  BTL done [facility]   07/12/2019 Christin Fudge, CNM

## 2019-07-10 NOTE — MAU Note (Addendum)
Water broke at 4am, UC around 0430am, decrease FM, uncomplicated pregnancy.

## 2019-07-10 NOTE — Anesthesia Preprocedure Evaluation (Signed)

## 2019-07-10 NOTE — Progress Notes (Signed)
LABOR PROGRESS NOTE  Deanna Hughes is a 29 y.o. [phone] at [redacted]w[redacted]d  admitted for SOL.  Subjective: Patient reports nausea. Bps noted to be hypotensive.   Objective: BP 102/64   Pulse 72   Temp 99 F (37.2 C) (Oral)   Resp 18   Ht 5\' 2"  (1.575 m)   Wt 83.5 kg   LMP 09/23/2018 (Exact Date)   SpO2 100%   BMI 33.65 kg/m  or  Vitals:   07/10/19 1541 07/10/19 1548 07/10/19 1551 07/10/19 1556  BP: (!) 89/51 (!) 109/55 (!) 95/48 102/64  Pulse: 67 81 73 72  Resp:      Temp:      TempSrc:      SpO2:      Weight:      Height:       Dilation: 7 Effacement (%): 70 Cervical Position: Posterior Station: -2 Presentation: Vertex Exam by:: cwhite,rnc FHT: baseline rate 140, moderate varibility, +acel, no decel Toco: every 4 mins   Labs: Lab Results  Component Value Date   WBC 6.9 07/10/2019   HGB 12.3 07/10/2019   HCT 38.4 07/10/2019   MCV 88.9 07/10/2019   PLT 139 (L) 07/10/2019    Patient Active Problem List   Diagnosis Date Noted  . Labor and delivery, indication for care 07/10/2019  . Umbilical hernia 59/29/2446  . Decreased appetite 02/18/2019  . Migraine without aura and without status migrainosus, not intractable 02/11/2019  . Supervision of normal pregnancy 12/31/2018  . Internal hemorrhoids   . Neck pain 05/17/2018  . Mid back pain 05/17/2018  . Other idiopathic scoliosis, thoracolumbar region 05/17/2018  . Right inguinal hernia 12/14/2015    Assessment / Plan: 29 y.o. [id number] at [redacted]w[redacted]d here for SOL.  Labor: SROM at 1555, cervical progression occurring, will continue with pitocin Fetal Wellbeing:  Category I  Pain Control:  Epidural  GBS: negative  Anticipated MOD:  Vaginal  Hypotension: patient BP dropping to 71H systolic, given phenylephrinex2 Nausea: Zofran given   Eulis Foster, MD Family Medicine, PGY-1  07/10/2019, 3:59 PM

## 2019-07-10 NOTE — Anesthesia Postprocedure Evaluation (Signed)
Anesthesia Post Note  Patient: Deanna Hughes  Procedure(s) Performed: AN AD HOC LABOR EPIDURAL     Patient location during evaluation: Mother Baby Anesthesia Type: Epidural Level of consciousness: awake and alert Pain management: pain level controlled Vital Signs Assessment: post-procedure vital signs reviewed and stable Respiratory status: spontaneous breathing, nonlabored ventilation and respiratory function stable Cardiovascular status: stable Postop Assessment: no headache, no backache and epidural receding Anesthetic complications: no   No complications documented.  Last Vitals:  Vitals:   07/10/19 2001 07/10/19 2030  BP: 103/64 107/90  Pulse: 63 (!) 57  Resp:  18  Temp:  36.6 C  SpO2:  100%    Last Pain:  Vitals:   07/10/19 2030  TempSrc: Oral  PainSc: 0-No pain   Pain Goal:                Epidural/Spinal Function Cutaneous sensation: Normal sensation (07/10/19 2030), Patient able to flex knees: Yes (07/10/19 2030), Patient able to lift hips off bed: Yes (07/10/19 2030), Back pain beyond tenderness at insertion site: No (07/10/19 2030), Progressively worsening motor and/or sensory loss: No (07/10/19 2030), Bowel and/or bladder incontinence post epidural: No (07/10/19 2030)  Rosabella Edgin

## 2019-07-11 ENCOUNTER — Inpatient Hospital Stay (HOSPITAL_COMMUNITY): Payer: 59 | Admitting: Certified Registered Nurse Anesthetist

## 2019-07-11 ENCOUNTER — Encounter (HOSPITAL_COMMUNITY): Payer: Self-pay | Admitting: Family Medicine

## 2019-07-11 ENCOUNTER — Encounter (HOSPITAL_COMMUNITY)
Admission: AD | Disposition: A | Discharge status: Home / Self Care | Payer: Self-pay | Source: Home / Self Care | Attending: Obstetrics and Gynecology

## 2019-07-11 SURGERY — LIGATION, FALLOPIAN TUBE, POSTPARTUM
Anesthesia: Choice

## 2019-07-11 MED ORDER — OXYCODONE-ACETAMINOPHEN 5-325 MG PO TABS
1.0000 | ORAL_TABLET | ORAL | Status: DC | PRN
  Administered 2019-07-11 – 2019-07-12 (×8): 1 via ORAL
  Filled 2019-07-11 (×8): qty 1

## 2019-07-11 MED ORDER — FAMOTIDINE 20 MG PO TABS
40.0000 mg | ORAL_TABLET | Freq: Once | ORAL | Status: AC
  Administered 2019-07-11: 40 mg via ORAL
  Filled 2019-07-11: qty 2

## 2019-07-11 MED ORDER — LACTATED RINGERS IV SOLN
INTRAVENOUS | Status: DC
  Administered 2019-07-11: 20 mL/h via INTRAVENOUS

## 2019-07-11 MED ORDER — METOCLOPRAMIDE HCL 10 MG PO TABS
10.0000 mg | ORAL_TABLET | Freq: Once | ORAL | Status: AC
  Administered 2019-07-11: 10 mg via ORAL
  Filled 2019-07-11: qty 1

## 2019-07-11 NOTE — Progress Notes (Signed)
Patient desires permanent sterilization.  Other reversible forms of contraception were discussed with patient; she declines all other modalities. Risks of procedure discussed with patient including but not limited to: risk of regret, permanence of method, bleeding, infection, injury to surrounding organs and need for additional procedures.  Failure risk of about 1% with increased risk of ectopic gestation if pregnancy occurs was also discussed with patient.  Also discussed possibility of post-tubal pain syndrome. Patient verbalized understanding of these risks and wants to proceed with sterilization.  Written informed consent obtained.  Discussed salpingectomy and patient desires only portion removed. To OR when ready.  Barrington Ellison, MD [facility] Family [facility] Fellow, [facility] for [employer], [place]

## 2019-07-11 NOTE — Progress Notes (Signed)
CSW received consult for hx of Anxiety and Panic Attacks.  CSW met with MOB to offer support and complete assessment.    CSW congratulated MOB on the birth of infant. CSW advised MOB of CSWs' role and the reason for CSW coming to visit with her. MOB reported that she was diagnosed with anxiety and panic attacks last year. MOB reports that her anxiety came from wearing the mask. MOB reported that aside from this, she doesn't deal with anxiety. CSW was notified that MOB has never been on medication for anxiety and doesn't desire to be placed on medications. MOB reported no previous therapy and no desire for resources when offered by CSW. MOB denies SI, HI as well as DV. MOB reports that she is feeling fine since giving birth.   MOB reported no other mental health dx and reports that she has all needed items to care for infant.   CSW provided education regarding the baby blues period vs. perinatal mood disorders, discussed treatment and gave resources for mental health follow up if concerns arise.  CSW recommends self-evaluation during the postpartum time period using the New Mom Checklist from Postpartum Progress and encouraged MOB to contact a medical professional if symptoms are noted at any time.   CSW provided review of Sudden Infant Death Syndrome (SIDS) precautions.   CSW identifies no further need for intervention and no barriers to discharge at this time.   Deanna Hughes, MSW, LCSW [facility] [phone]

## 2019-07-11 NOTE — Lactation Note (Signed)
This note was copied from a baby's chart. Lactation Consultation Note  Patient Name: Deanna Hughes Date: 07/11/2019 Reason for consult: Initial assessment;Term;[place]  P4 mother whose infant is now 17 hours old.  This is a term baby at 40+0 weeks.  Mother did not breast feed her other three children (ages 65, 30 and 27 years old).  Mother's feeding preference is breast/bottle.  Mother had recently finished feeding baby when I arrived.  Baby was awake and quiet in the bassinet.  Reviewed basic breast feeding information since this is mother's first time breast feeding.    Encouraged to feed 8-12 times/24 hours or sooner if baby shows feeding cues.  Reviewed cues and mother stated she is observing cues.  Reminded her to put baby to the breast prior to giving any formula supplementation.  Discussed how to obtain a deep latch.  Mother stated her left nipple is sore from a poor latch, however, her RN assisted with this last feeding on the right side and this side is without soreness.  Asked mother to express colostrum drops and rub into nipple/areolas after feeding.    Mother will call her RN/LC for latch assistance as needed.  She has supplemented with Fawn Kirk using the gold nipple.  Mother feels like her daughter "drinks too fast" with this nipple.  Explained how she can stop the flow by gently pulling the bottle from her mouth and lowering the nipple for better pacing.  Provided a different nipple to try.  I noticed mother had her bottle that she plans to use at home and suggested she use that bottle/nipple with the next feeding to determine how this may work for baby.  Mother plans on doing this.  She will call if she has any difficulty feeding baby.  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  Provided our pump choices and, at her convenience, mother will research her options and call me when she has made a decision on her employee DEBP.  Insurance card returned to mother.  No  support person present at this time.  Mom made aware of O/P services, breastfeeding support groups, community resources, and our phone # for post-discharge questions.   Mother is awaiting a tubal ligation scheduled for approximately 1630 today.     Maternal Data Formula Feeding for Exclusion: Yes Reason for exclusion: Mother's choice to formula and breast feed on admission Has patient been taught Hand Expression?: Yes Does the patient have breastfeeding experience prior to this delivery?: No  Feeding Feeding Type: Breast Fed  LATCH Score Latch: Grasps breast easily, tongue down, lips flanged, rhythmical sucking.  Audible Swallowing: A few with stimulation  Type of Nipple: Everted at rest and after stimulation  Comfort (Breast/Nipple): Soft / non-tender  Hold (Positioning): Full assist, staff holds infant at breast  LATCH Score: 7  Interventions    Lactation Tools Discussed/Used     Consult Status Consult Status: Follow-up Date: 07/12/19 Follow-up type: In-patient    Deanna Hughes R Deanna Hughes 07/11/2019, 1:14 PM

## 2019-07-11 NOTE — Progress Notes (Signed)
Dr Rosita Fire is notified that pt ordered breakfast @0705 . She received breakfast at 0800. Ms Deanna Hughes finished her breakfast at 0830. She is scheduled for a BTL this afternoon. She is currently NPO.

## 2019-07-11 NOTE — Progress Notes (Signed)
Post Partum Day #1 Subjective: no complaints, up ad lib and tolerating PO; breastfeeding going well; still preparing for ppBTL  Objective: Blood pressure 101/64, pulse 72, temperature 98.2 F (36.8 C), temperature source Oral, resp. rate 18, height 5\' 2"  (1.575 m), weight 83.5 kg, last menstrual period 09/23/2018, SpO2 100 %, unknown if currently breastfeeding.  Physical Exam:  General: alert, cooperative and no distress Lochia: appropriate Uterine Fundus: firm DVT Evaluation: No evidence of DVT seen on physical exam.  Recent Labs    07/10/19 0913  HGB 12.3  HCT 38.4    Assessment/Plan: Plan for discharge tomorrow  Will be NPO for ppBTL after breakfast- doesn't have a time yet   LOS: 1 day   Myrtis Ser CNM 07/11/2019, 7:51 AM

## 2019-07-12 MED ORDER — TRAMADOL HCL 50 MG PO TABS: 50.0000 mg | ORAL_TABLET | Freq: Four times a day (QID) | ORAL | 0 refills | Status: DC | PRN

## 2019-07-12 NOTE — Lactation Note (Signed)
This note was copied from a baby's chart. Lactation Consultation Note  Patient Name: Deanna Hughes [id number] Date: 07/12/2019 Reason for consult: Follow-up assessment Baby is 39 hours old/5% weight loss.  Mom is both breast and formula feeding per choice.  She states baby is not latching as well.  Discussed milk coming to volume and the prevention and treatment of engorgement.  Mom has a Medela DEBP for home use.  She states she plans on pumping once she gets home.  Instructed to pump every 3 hours to establish and maintain her milk supply.  Questions answered.  Reviewed outpatient services and encouraged to call prn.  Maternal Data    Feeding Feeding Type: Formula Nipple Type: Slow - flow  LATCH Score                   Interventions    Lactation Tools Discussed/Used     Consult Status Consult Status: Complete    Vivianne Carles S 07/12/2019, 10:06 AM

## 2019-07-12 NOTE — Discharge Instructions (Signed)

## 2019-07-13 ENCOUNTER — Ambulatory Visit: Payer: Self-pay

## 2019-07-13 NOTE — Lactation Note (Signed)
This note was copied from a baby's chart. Lactation Consultation Note  Patient Name: Girl Penny Arrambide [id number] Date: 07/13/2019 Reason for consult: Follow-up assessment  P4 mother whose infant is now 62 hours old.  This is a term baby at 40+0 weeks.  Baby was placed under double phototherapy yesterday and lights are now discontinued.  The bilirubin level was 10.1 mg/dl at 59 hours of life.   Mother has been breast feeding and supplementing with [place].  This morning she was able to pump 70 mls of EBM.  She is now supplementing with her EBM.  Praised her efforts for breast feeding and pumping.  Engorgement prevention/treatment reviewed.    Mother will continue to feed 8-12 times/24 hours or sooner if baby shows feeding cues.  She is familiar with hand expression and had no questions related to pumping.  We discussed using her own personal bottle/nipple yesterday to determine if baby would transition nicely with this nipple and mother informed me that she does well with that nipple.  Reminded mother to consistently put her baby to breast first and really work with her on latching if this is mother's intended goal.  Mother verbalized understanding.  Mother has a manual pump and a DEBP for home use.  She is looking forward to being discharged today.  No support person present at this time.  Mother has our OP phone number for concerns after discharge.    Maternal Data    Feeding Feeding Type: Bottle Fed - Formula  LATCH Score                   Interventions    Lactation Tools Discussed/Used Tools: Pump   Consult Status Consult Status: Complete Date: 07/13/19 Follow-up type: Call as needed    Kasandra Fehr R Shaylan Tutton 07/13/2019, 10:31 AM

## 2019-07-15 ENCOUNTER — Telehealth: Payer: Self-pay | Admitting: Advanced Practice Midwife

## 2019-07-15 ENCOUNTER — Encounter: Payer: Self-pay | Admitting: Family Medicine

## 2019-07-15 ENCOUNTER — Other Ambulatory Visit: Payer: Self-pay | Admitting: Advanced Practice Midwife

## 2019-07-15 MED ORDER — HYDROCODONE-ACETAMINOPHEN 5-325 MG PO TABS: 1.0000 | ORAL_TABLET | Freq: Four times a day (QID) | ORAL | 0 refills | Status: DC | PRN

## 2019-07-15 NOTE — Telephone Encounter (Signed)
Deanna Hughes at [facility] that medication rx for hydrocodone can not be faxed the patient will have to take hard copy to pharmacy or the med will need to be eprescribed.

## 2019-07-15 NOTE — Progress Notes (Signed)
vicodin for postpartum cramps (can't take motrin)

## 2019-07-16 ENCOUNTER — Other Ambulatory Visit: Payer: Self-pay

## 2019-07-16 ENCOUNTER — Encounter: Payer: Self-pay | Admitting: Obstetrics & Gynecology

## 2019-07-16 ENCOUNTER — Ambulatory Visit (INDEPENDENT_AMBULATORY_CARE_PROVIDER_SITE_OTHER): Payer: 59 | Admitting: Obstetrics & Gynecology

## 2019-07-16 VITALS — BP 120/76 | HR 87 | Ht 62.0 in | Wt 159.0 lb

## 2019-07-16 DIAGNOSIS — Z3009 Encounter for other general counseling and advice on contraception: Secondary | ICD-10-CM | POA: Diagnosis not present

## 2019-07-16 NOTE — Telephone Encounter (Signed)
Please have her contact her OBGYN about this. They need to make sure she does not have infection.

## 2019-07-16 NOTE — Progress Notes (Signed)
Preoperative History and Physical  Deanna Hughes is a 29 y.o. [phone] with Patient's last menstrual period was 09/23/2018 (exact date). admitted for a lap BTL using electrocautery.  Declines salpingectomy  PMH:    Past Medical History:  Diagnosis Date  . Anxiety   . BV (bacterial vaginosis) 04/02/2015  . Chlamydia    treated 6/6  . Chronic back pain   . Chronic headaches    MIGRAINES  . Contraceptive management 01/25/2013  . Encounter for Nexplanon removal 02/27/2015  . Irregular bleeding 06/03/2013  . Miscarriage 03/27/2013  . Nexplanon insertion 04/11/2013   nexplanon inserted 04/11/13 remove 3/32/18  . Right sided abdominal pain   . Scoliosis   . Seasonal allergies 07/03/2012  . Sinus infection 08/27/2013  . Supervision of normal pregnancy 08/27/2015    [facility] at   7+5 weeks  FOB  Dating By  LMP  Pap  GC/CT Initial:                36+wks: Genetic Screen NT/IT:  CF screen  Anatomic [place]  Flu vaccine  Tdap Recommended ~ 28wks Glucose Screen  2 hr GBS  Feed Preference  Contraception  Circumcision  Childbirth Classes  Pediatrician    . Threatened abortion in early pregnancy 03/20/2013  . Vaginal discharge 10/03/2012  . Yeast infection 07/03/2012    PSH:     Past Surgical History:  Procedure Laterality Date  . BIOPSY  09/18/2018   Procedure: BIOPSY;  Surgeon: Danie Binder, MD;  [facility];  Service: Endoscopy;;  . COLONOSCOPY WITH PROPOFOL N/A 09/18/2018   Procedure: COLONOSCOPY WITH PROPOFOL;  Surgeon: Danie Binder, MD;  Location: [facility];  Service: Endoscopy;  Laterality: N/A;  8:30am  . FLEXIBLE SIGMOIDOSCOPY N/A 10/05/2018   Procedure: FLEXIBLE SIGMOIDOSCOPY;  Surgeon: Danie Binder, MD;  Location: [facility];  Service: Endoscopy;  Laterality: N/A;  8:30  . HEMORRHOID BANDING N/A 10/05/2018   Procedure: Thayer Jew;  Surgeon: Danie Binder, MD;  Location: [facility];  Service: Endoscopy;  Laterality: N/A;  . HERNIA REPAIR     . INGUINAL HERNIA REPAIR Right 04/19/2017   Procedure: HERNIA REPAIR INGUINAL ADULT;  Surgeon: Robert Bellow, MD;  Location: [facility];  Service: General;  Laterality: Right;  . WISDOM TOOTH EXTRACTION      POb/GynH:      OB History    Gravida  6   Para  4   Term  4   Preterm  0   AB  2   Living  4     SAB  2   TAB  0   Ectopic  0   Multiple  0   Live Births  4        Obstetric Comments  1st Menstrual Cycle:  15  1st Pregnancy:  69         SH:   Social History   Tobacco Use  . Smoking status: Never Smoker  . Smokeless tobacco: Never Used  Vaping Use  . Vaping Use: Never used  Substance Use Topics  . Alcohol use: No  . Drug use: No    FH:    Family History  Problem Relation Age of Onset  . Asthma Mother   . Asthma Sister   . Asthma Brother   . Asthma Daughter   . Diabetes Maternal Grandmother   . Hypertension Maternal Grandmother   . Asthma Maternal Grandmother   .  Heart disease Maternal Grandmother   . Stroke Maternal Grandmother   . Hypertension Maternal Grandfather   . Asthma Maternal Grandfather   . Heart disease Maternal Grandfather   . Asthma Son   . Colon cancer Neg Hx   . Colon polyps Neg Hx      Allergies:  Allergies  Allergen Reactions  . Aspirin Shortness Of Breath and Other (See Comments)    Heart beats fast  . Advil [Ibuprofen] Other (See Comments)    Breakout, heart palpitations and trouble breathing.   . Latex Rash    Medications:       Current Outpatient Medications:  .  HYDROcodone-acetaminophen (NORCO) 5-325 MG tablet, Take 1 tablet by mouth every 6 (six) hours as needed for moderate pain., Disp: 15 tablet, Rfl: 0 .  traMADol (ULTRAM) 50 MG tablet, Take 1 tablet (50 mg total) by mouth every 6 (six) hours as needed for severe pain., Disp: 20 tablet, Rfl: 0  Review of Systems:   Review of Systems  Constitutional: Negative for fever, chills, weight loss, malaise/fatigue and diaphoresis.  HENT: Negative for  hearing loss, ear pain, nosebleeds, congestion, sore throat, neck pain, tinnitus and ear discharge.   Eyes: Negative for blurred vision, double vision, photophobia, pain, discharge and redness.  Respiratory: Negative for cough, hemoptysis, sputum production, shortness of breath, wheezing and stridor.   Cardiovascular: Negative for chest pain, palpitations, orthopnea, claudication, leg swelling and PND.  Gastrointestinal: Positive for abdominal pain. Negative for heartburn, nausea, vomiting, diarrhea, constipation, blood in stool and melena.  Genitourinary: Negative for dysuria, urgency, frequency, hematuria and flank pain.  Musculoskeletal: Negative for myalgias, back pain, joint pain and falls.  Skin: Negative for itching and rash.  Neurological: Negative for dizziness, tingling, tremors, sensory change, speech change, focal weakness, seizures, loss of consciousness, weakness and headaches.  Endo/Heme/Allergies: Negative for environmental allergies and polydipsia. Does not bruise/bleed easily.  Psychiatric/Behavioral: Negative for depression, suicidal ideas, hallucinations, memory loss and substance abuse. The patient is not nervous/anxious and does not have insomnia.      PHYSICAL EXAM:  Blood pressure 120/76, pulse 87, height 5\' 2"  (1.575 m), weight 159 lb (72.1 kg), last menstrual period 09/23/2018, not currently breastfeeding.    Vitals reviewed. Constitutional: She is oriented to person, place, and time. She appears well-developed and well-nourished.  HENT:  Head: Normocephalic and atraumatic.  Right Ear: External ear normal.  Left Ear: External ear normal.  Nose: Nose normal.  Mouth/Throat: Oropharynx is clear and moist.  Eyes: Conjunctivae and EOM are normal. Pupils are equal, round, and reactive to light. Right eye exhibits no discharge. Left eye exhibits no discharge. No scleral icterus.  Neck: Normal range of motion. Neck supple. No tracheal deviation present. No thyromegaly  present.  Cardiovascular: Normal rate, regular rhythm, normal heart sounds and intact distal pulses.  Exam reveals no gallop and no friction rub.   No murmur heard. Respiratory: Effort normal and breath sounds normal. No respiratory distress. She has no wheezes. She has no rales. She exhibits no tenderness.  GI: Soft. Bowel sounds are normal. She exhibits no distension and no mass. There is tenderness. There is no rebound and no guarding.  Genitourinary:       Vulva is normal without lesions Vagina is pink moist without discharge Cervix normal in appearance and pap is normal Uterus is normal size, contour, position, consistency, mobility, non-tender Adnexa is negative with normal sized ovaries by sonogram  Musculoskeletal: Normal range of motion. She exhibits no edema and  no tenderness.  Neurological: She is alert and oriented to person, place, and time. She has normal reflexes. She displays normal reflexes. No cranial nerve deficit. She exhibits normal muscle tone. Coordination normal.  Skin: Skin is warm and dry. No rash noted. No erythema. No pallor.  Psychiatric: She has a normal mood and affect. Her behavior is normal. Judgment and thought content normal.    Labs: Results for orders placed or performed during the hospital encounter of 07/10/19 (from the past 336 hour(s))  POCT fern test   Collection Time: 07/10/19  7:00 AM  Result Value Ref Range   POCT Fern Test Negative = intact amniotic membranes   SARS Coronavirus 2 by RT PCR (hospital order, performed in [facility] hospital lab) Nasopharyngeal Nasopharyngeal Swab   Collection Time: 07/10/19  9:06 AM   Specimen: Nasopharyngeal Swab  Result Value Ref Range   SARS Coronavirus 2 NEGATIVE NEGATIVE  CBC   Collection Time: 07/10/19  9:13 AM  Result Value Ref Range   WBC 6.9 4.0 - 10.5 K/uL   RBC 4.32 3.87 - 5.11 MIL/uL   Hemoglobin 12.3 12.0 - 15.0 g/dL   HCT 38.4 36 - 46 %   MCV 88.9 80.0 - 100.0 fL   MCH 28.5 26.0 - 34.0 pg    MCHC 32.0 30.0 - 36.0 g/dL   RDW 15.7 (H) 11.5 - 15.5 %   Platelets 139 (L) 150 - 400 K/uL   nRBC 0.0 0.0 - 0.2 %  RPR   Collection Time: 07/10/19  9:13 AM  Result Value Ref Range   RPR Ser Ql NON REACTIVE NON REACTIVE  Type and screen [place]   Collection Time: 07/10/19  9:14 AM  Result Value Ref Range   ABO/RH(D) O POS    Antibody Screen NEG    Sample Expiration      07/13/2019,2359 Performed at [facility], [address] [place], [place] [address]   ABO/Rh   Collection Time: 07/10/19  9:14 AM  Result Value Ref Range   ABO/RH(D)      O POS Performed at [facility] [address], [place], [place] [address]   Results for orders placed or performed in visit on 07/05/19 (from the past 336 hour(s))  POC Urinalysis Dipstick OB   Collection Time: 07/05/19 11:52 AM  Result Value Ref Range   Color, UA     Clarity, UA     Glucose, UA Negative Negative   Bilirubin, UA     Ketones, UA neg    Spec Grav, UA     Blood, UA neg    pH, UA     POC,PROTEIN,UA Negative Negative, Trace, Small (1+), Moderate (2+), Large (3+), 4+   Urobilinogen, UA     Nitrite, UA neg    Leukocytes, UA Small (1+) (A) Negative   Appearance     Odor      EKG: Orders placed or performed during the hospital encounter of 05/24/19  . EKG 12-Lead  . EKG 12-Lead    Imaging Studies: No results found.    Assessment: Multiparous female desires sterilization, permanent  Plan: Lap BTL using electrocautery  Florian Buff 07/16/2019 3:28 PM

## 2019-07-18 ENCOUNTER — Other Ambulatory Visit: Payer: Self-pay | Admitting: Advanced Practice Midwife

## 2019-07-18 MED ORDER — DICLOXACILLIN SODIUM 250 MG PO CAPS
250.0000 mg | ORAL_CAPSULE | Freq: Four times a day (QID) | ORAL | 0 refills | Status: DC
Start: 2019-07-18 — End: 2019-08-22

## 2019-07-18 NOTE — Progress Notes (Signed)
Breasts w/red streaks/fever. rx dicloxicillin 250mg  QID.

## 2019-07-28 IMAGING — DX CHEST - 2 VIEW
2 series · 2 of 2 positions shown · non-contrast
Comparison: 01/21/2018, 03/11/2015

CLINICAL DATA: Cough

EXAM:
CHEST - 2 VIEW

[chest pa]
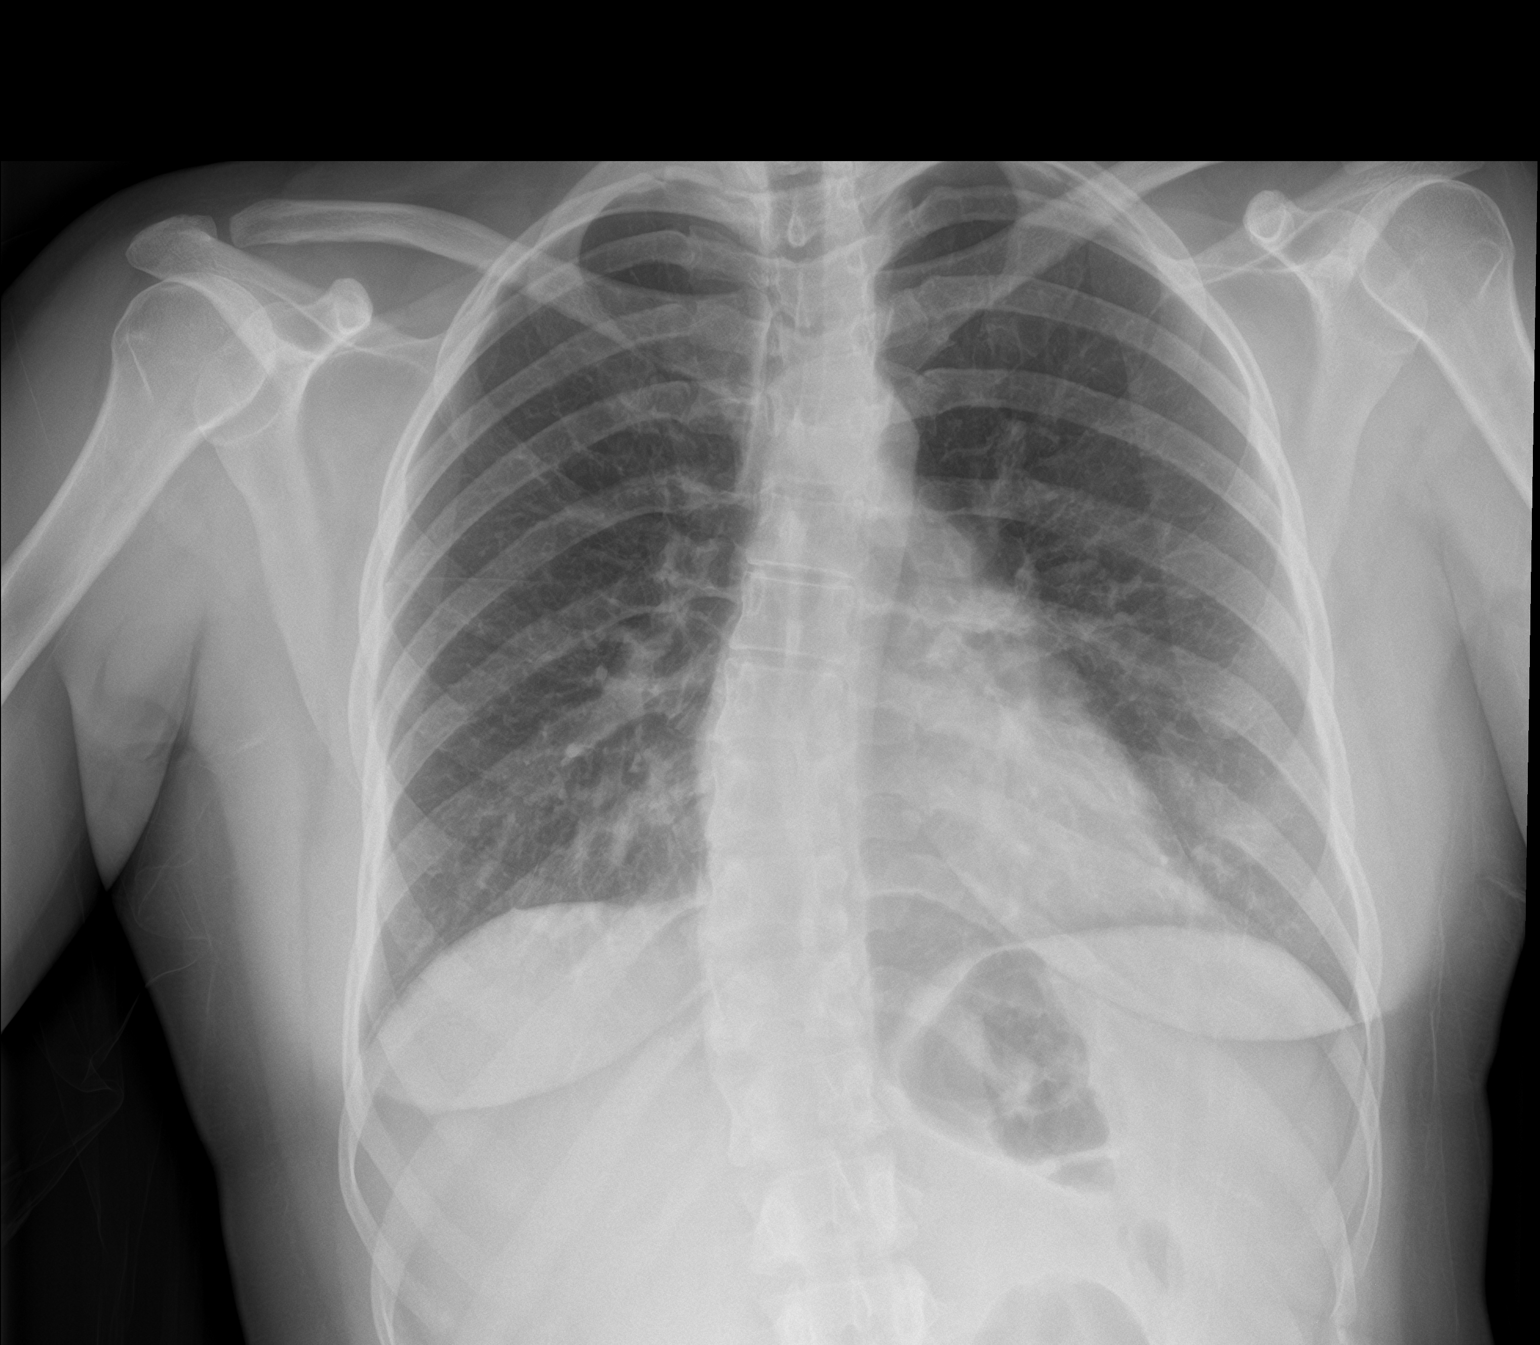

[chest lat]
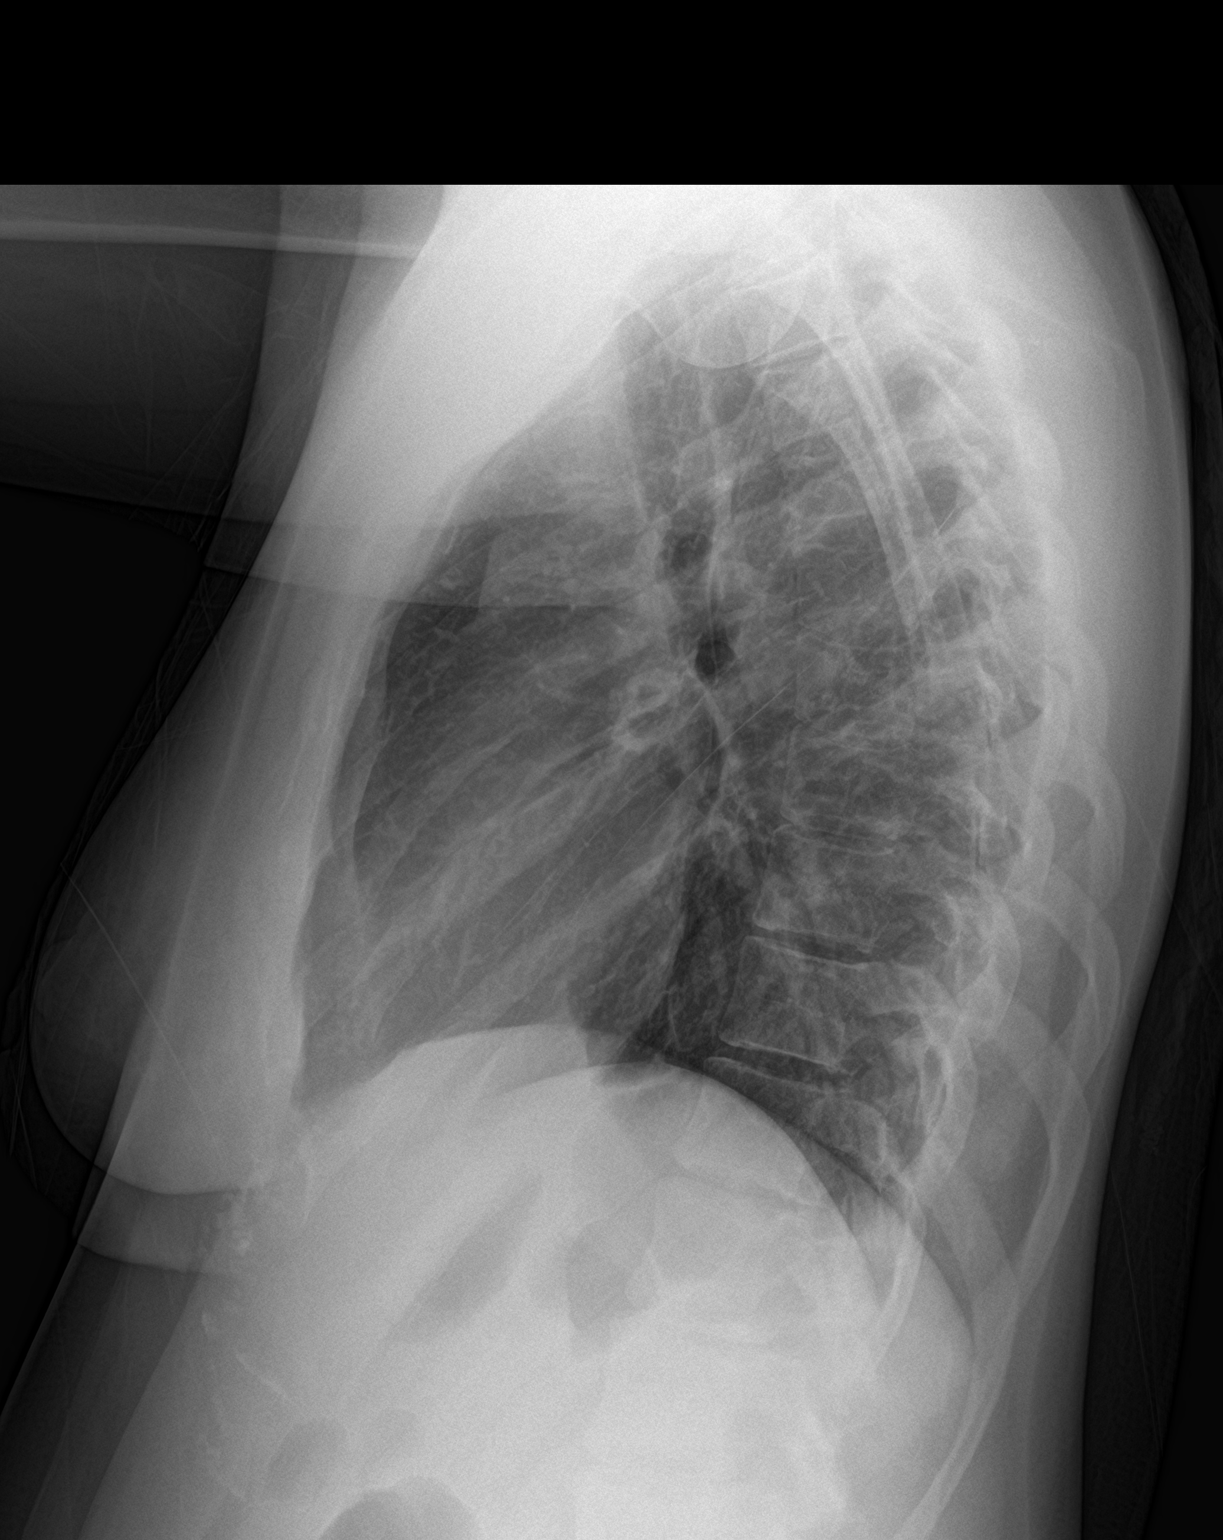

[2 of 2 positions shown; findings below may reference images not displayed]

FINDINGS: Mild scoliosis of the spine. No focal consolidation or effusion.
Stable cardiomediastinal silhouette. Mild central airways
thickening. No pneumothorax.
IMPRESSION: No active cardiopulmonary disease. Mild central airways thickening
which may be secondary to inflammatory process.

## 2019-08-14 ENCOUNTER — Ambulatory Visit: Payer: 59 | Admitting: Advanced Practice Midwife

## 2019-08-16 NOTE — Patient Instructions (Signed)
Deanna Hughes  08/16/2019     @PREFPERIOPPHARMACY @   Your procedure is scheduled on  08/21/2019  Report to [facility] at  [place].  Call this number if you have problems the morning of surgery:  [phone]   Remember:  Do not eat or drink after midnight.                         Take these medicines the morning of surgery with A SIP OF WATER  Hydrocodone or tramadol (if needed).    Do not wear jewelry, make-up or nail polish.  Do not wear lotions, powders, or perfumes, or deodorant. Please wear deodorant and brush your teeth.  Do not shave 48 hours prior to surgery.  Men may shave face and neck.  Do not bring valuables to the hospital.  [facility] is not responsible for any belongings or valuables.  Contacts, dentures or bridgework may not be worn into surgery.  Leave your suitcase in the car.  After surgery it may be brought to your room.  For patients admitted to the hospital, discharge time will be determined by your treatment team.  Patients discharged the day of surgery will not be allowed to drive home.   Name and phone number of your driver:   family Special instructions:  DO NOT smoke the morning of your procedure.  Please read over the following fact sheets that you were given. Anesthesia Post-op Instructions and Care and Recovery After Surgery       Laparoscopic Tubal Ligation, Care After This sheet gives you information about how to care for yourself after your procedure. Your health care provider may also give you more specific instructions. If you have problems or questions, contact your health care provider. What can I expect after the procedure? After the procedure, it is common to have:  A sore throat.  Discomfort in your shoulder.  Mild discomfort or cramping in your abdomen.  Gas pains.  Pain or soreness in the area where the surgical incision was made.  A bloated feeling.  Tiredness.  Nausea.  Vomiting. Follow these  instructions at home: Medicines  Take over-the-counter and prescription medicines only as told by your health care provider.  Do not take aspirin because it can cause bleeding.  Ask your health care provider if the medicine prescribed to you: ? Requires you to avoid driving or using heavy machinery. ? Can cause constipation. You may need to take actions to prevent or treat constipation, such as:  Drink enough fluid to keep your urine pale yellow.  Take over-the-counter or prescription medicines.  Eat foods that are high in fiber, such as beans, whole grains, and fresh fruits and vegetables.  Limit foods that are high in fat and processed sugars, such as fried or sweet foods. Incision care      Follow instructions from your health care provider about how to take care of your incision. Make sure you: ? Wash your hands with soap and water before and after you change your bandage (dressing). If soap and water are not available, use hand sanitizer. ? Change your dressing as told by your health care provider. ? Leave stitches (sutures), skin glue, or adhesive strips in place. These skin closures may need to stay in place for 2 weeks or longer. If adhesive strip edges start to loosen and curl up, you may trim the loose edges. Do not  remove adhesive strips completely unless your health care provider tells you to do that.  Check your incision area every day for signs of infection. Check for: ? Redness, swelling, or pain. ? Fluid or blood. ? Warmth. ? Pus or a bad smell. Activity  Rest as told by your health care provider.  Avoid sitting for a long time without moving. Get up to take short walks every 1-2 hours. This is important to improve blood flow and breathing. Ask for help if you feel weak or unsteady.  Return to your normal activities as told by your health care provider. Ask your health care provider what activities are safe for you. General instructions  Do not take baths,  swim, or use a hot tub until your health care provider approves. Ask your health care provider if you may take showers. You may only be allowed to take sponge baths.  Have someone help you with your daily household tasks for the first few days.  Keep all follow-up visits as told by your health care provider. This is important. Contact a health care provider if:  You have redness, swelling, or pain around your incision.  Your incision feels warm to the touch.  You have pus or a bad smell coming from your incision.  The edges of your incision break open after the sutures have been removed.  Your pain does not improve after 2-3 days.  You have a rash.  You repeatedly become dizzy or light-headed.  Your pain medicine is not helping. Get help right away if you:  Have a fever.  Faint.  Have increasing pain in your abdomen.  Have severe pain in one or both of your shoulders.  Have fluid or blood coming from your sutures or from your vagina.  Have shortness of breath or difficulty breathing.  Have chest pain or leg pain.  Have ongoing nausea, vomiting, or diarrhea. Summary  After the procedure, it is common to have mild discomfort or cramping in your abdomen.  Take over-the-counter and prescription medicines only as told by your health care provider.  Watch for symptoms that should prompt you to call your health care provider.  Keep all follow-up visits as told by your health care provider. This is important. This information is not intended to replace advice given to you by your health care provider. Make sure you discuss any questions you have with your health care provider. Document Revised: 06/12/2018 Document Reviewed: 11/28/2017 Elsevier Patient Education  2020 [place] Anesthesia, Adult, Care After This sheet gives you information about how to care for yourself after your procedure. Your health care provider may also give you more specific  instructions. If you have problems or questions, contact your health care provider. What can I expect after the procedure? After the procedure, the following side effects are common:  Pain or discomfort at the IV site.  Nausea.  Vomiting.  Sore throat.  Trouble concentrating.  Feeling cold or chills.  Weak or tired.  Sleepiness and fatigue.  Soreness and body aches. These side effects can affect parts of the body that were not involved in surgery. Follow these instructions at home:  For at least 24 hours after the procedure:  Have a responsible adult stay with you. It is important to have someone help care for you until you are awake and alert.  Rest as needed.  Do not: ? Participate in activities in which you could fall or become injured. ? Drive. ? Use heavy  machinery. ? Drink alcohol. ? Take sleeping pills or medicines that cause drowsiness. ? Make important decisions or sign legal documents. ? Take care of children on your own. Eating and drinking  Follow any instructions from your health care provider about eating or drinking restrictions.  When you feel hungry, start by eating small amounts of foods that are soft and easy to digest (bland), such as toast. Gradually return to your regular diet.  Drink enough fluid to keep your urine pale yellow.  If you vomit, rehydrate by drinking water, juice, or clear broth. General instructions  If you have sleep apnea, surgery and certain medicines can increase your risk for breathing problems. Follow instructions from your health care provider about wearing your sleep device: ? Anytime you are sleeping, including during daytime naps. ? While taking prescription pain medicines, sleeping medicines, or medicines that make you drowsy.  Return to your normal activities as told by your health care provider. Ask your health care provider what activities are safe for you.  Take over-the-counter and prescription medicines only  as told by your health care provider.  If you smoke, do not smoke without supervision.  Keep all follow-up visits as told by your health care provider. This is important. Contact a health care provider if:  You have nausea or vomiting that does not get better with medicine.  You cannot eat or drink without vomiting.  You have pain that does not get better with medicine.  You are unable to pass urine.  You develop a skin rash.  You have a fever.  You have redness around your IV site that gets worse. Get help right away if:  You have difficulty breathing.  You have chest pain.  You have blood in your urine or stool, or you vomit blood. Summary  After the procedure, it is common to have a sore throat or nausea. It is also common to feel tired.  Have a responsible adult stay with you for the first 24 hours after general anesthesia. It is important to have someone help care for you until you are awake and alert.  When you feel hungry, start by eating small amounts of foods that are soft and easy to digest (bland), such as toast. Gradually return to your regular diet.  Drink enough fluid to keep your urine pale yellow.  Return to your normal activities as told by your health care provider. Ask your health care provider what activities are safe for you. This information is not intended to replace advice given to you by your health care provider. Make sure you discuss any questions you have with your health care provider. Document Revised: 01/06/2017 Document Reviewed: 08/19/2016 Elsevier Patient Education  [place]. How to Use Chlorhexidine for Bathing Chlorhexidine gluconate (CHG) is a germ-killing (antiseptic) solution that is used to clean the skin. It can get rid of the bacteria that normally live on the skin and can keep them away for about 24 hours. To clean your skin with CHG, you may be given:  A CHG solution to use in the shower or as part of a sponge  bath.  A prepackaged cloth that contains CHG. Cleaning your skin with CHG may help lower the risk for infection:  While you are staying in the intensive care unit of the hospital.  If you have a vascular access, such as a central line, to provide short-term or long-term access to your veins.  If you have a catheter to drain urine  from your bladder.  If you are on a ventilator. A ventilator is a machine that helps you breathe by moving air in and out of your lungs.  After surgery. What are the risks? Risks of using CHG include:  A skin reaction.  Hearing loss, if CHG gets in your ears.  Eye injury, if CHG gets in your eyes and is not rinsed out.  The CHG product catching fire. Make sure that you avoid smoking and flames after applying CHG to your skin. Do not use CHG:  If you have a chlorhexidine allergy or have previously reacted to chlorhexidine.  On babies younger than 82 months of age. How to use CHG solution  Use CHG only as told by your health care provider, and follow the instructions on the label.  Use the full amount of CHG as directed. Usually, this is one bottle. During a shower Follow these steps when using CHG solution during a shower (unless your health care provider gives you different instructions): 1. Start the shower. 2. Use your normal soap and shampoo to wash your face and hair. 3. Turn off the shower or move out of the shower stream. 4. Pour the CHG onto a clean washcloth. Do not use any type of brush or rough-edged sponge. 5. Starting at your neck, lather your body down to your toes. Make sure you follow these instructions: ? If you will be having surgery, pay special attention to the part of your body where you will be having surgery. Scrub this area for at least 1 minute. ? Do not use CHG on your head or face. If the solution gets into your ears or eyes, rinse them well with water. ? Avoid your genital area. ? Avoid any areas of skin that have broken  skin, cuts, or scrapes. ? Scrub your back and under your arms. Make sure to wash skin folds. 6. Let the lather sit on your skin for 1-2 minutes or as long as told by your health care provider. 7. Thoroughly rinse your entire body in the shower. Make sure that all body creases and crevices are rinsed well. 8. Dry off with a clean towel. Do not put any substances on your body afterward--such as powder, lotion, or perfume--unless you are told to do so by your health care provider. Only use lotions that are recommended by the manufacturer. 9. Put on clean clothes or pajamas. 10. If it is the night before your surgery, sleep in clean sheets.  During a sponge bath Follow these steps when using CHG solution during a sponge bath (unless your health care provider gives you different instructions): 1. Use your normal soap and shampoo to wash your face and hair. 2. Pour the CHG onto a clean washcloth. 3. Starting at your neck, lather your body down to your toes. Make sure you follow these instructions: ? If you will be having surgery, pay special attention to the part of your body where you will be having surgery. Scrub this area for at least 1 minute. ? Do not use CHG on your head or face. If the solution gets into your ears or eyes, rinse them well with water. ? Avoid your genital area. ? Avoid any areas of skin that have broken skin, cuts, or scrapes. ? Scrub your back and under your arms. Make sure to wash skin folds. 4. Let the lather sit on your skin for 1-2 minutes or as long as told by your health care provider. 5. Using a  different clean, wet washcloth, thoroughly rinse your entire body. Make sure that all body creases and crevices are rinsed well. 6. Dry off with a clean towel. Do not put any substances on your body afterward--such as powder, lotion, or perfume--unless you are told to do so by your health care provider. Only use lotions that are recommended by the manufacturer. 7. Put on clean  clothes or pajamas. 8. If it is the night before your surgery, sleep in clean sheets. How to use CHG prepackaged cloths  Only use CHG cloths as told by your health care provider, and follow the instructions on the label.  Use the CHG cloth on clean, dry skin.  Do not use the CHG cloth on your head or face unless your health care provider tells you to.  When washing with the CHG cloth: ? Avoid your genital area. ? Avoid any areas of skin that have broken skin, cuts, or scrapes. Before surgery Follow these steps when using a CHG cloth to clean before surgery (unless your health care provider gives you different instructions): 1. Using the CHG cloth, vigorously scrub the part of your body where you will be having surgery. Scrub using a back-and-forth motion for 3 minutes. The area on your body should be completely wet with CHG when you are done scrubbing. 2. Do not rinse. Discard the cloth and let the area air-dry. Do not put any substances on the area afterward, such as powder, lotion, or perfume. 3. Put on clean clothes or pajamas. 4. If it is the night before your surgery, sleep in clean sheets.  For general bathing Follow these steps when using CHG cloths for general bathing (unless your health care provider gives you different instructions). 1. Use a separate CHG cloth for each area of your body. Make sure you wash between any folds of skin and between your fingers and toes. Wash your body in the following order, switching to a new cloth after each step: ? The front of your neck, shoulders, and chest. ? Both of your arms, under your arms, and your hands. ? Your stomach and groin area, avoiding the genitals. ? Your right leg and foot. ? Your left leg and foot. ? The back of your neck, your back, and your buttocks. 2. Do not rinse. Discard the cloth and let the area air-dry. Do not put any substances on your body afterward--such as powder, lotion, or perfume--unless you are told to do so  by your health care provider. Only use lotions that are recommended by the manufacturer. 3. Put on clean clothes or pajamas. Contact a health care provider if:  Your skin gets irritated after scrubbing.  You have questions about using your solution or cloth. Get help right away if:  Your eyes become very red or swollen.  Your eyes itch badly.  Your skin itches badly and is red or swollen.  Your hearing changes.  You have trouble seeing.  You have swelling or tingling in your mouth or throat.  You have trouble breathing.  You swallow any chlorhexidine. Summary  Chlorhexidine gluconate (CHG) is a germ-killing (antiseptic) solution that is used to clean the skin. Cleaning your skin with CHG may help to lower your risk for infection.  You may be given CHG to use for bathing. It may be in a bottle or in a prepackaged cloth to use on your skin. Carefully follow your health care provider's instructions and the instructions on the product label.  Do not use CHG  if you have a chlorhexidine allergy.  Contact your health care provider if your skin gets irritated after scrubbing. This information is not intended to replace advice given to you by your health care provider. Make sure you discuss any questions you have with your health care provider. Document Revised: 03/22/2018 Document Reviewed: 12/01/2016 Elsevier Patient Education  [place].

## 2019-08-19 ENCOUNTER — Other Ambulatory Visit (HOSPITAL_COMMUNITY): Admission: RE | Admit: 2019-08-19 | Payer: 59 | Source: Ambulatory Visit

## 2019-08-19 ENCOUNTER — Other Ambulatory Visit: Payer: Self-pay | Admitting: Obstetrics & Gynecology

## 2019-08-19 ENCOUNTER — Encounter (HOSPITAL_COMMUNITY): Payer: Self-pay

## 2019-08-19 ENCOUNTER — Encounter (HOSPITAL_COMMUNITY)
Admission: RE | Admit: 2019-08-19 | Discharge: 2019-08-19 | Disposition: A | Discharge status: Home / Self Care | Payer: 59 | Source: Ambulatory Visit | Attending: Obstetrics & Gynecology | Admitting: Obstetrics & Gynecology

## 2019-08-21 ENCOUNTER — Encounter (HOSPITAL_COMMUNITY): Admission: RE | Payer: Self-pay | Source: Home / Self Care

## 2019-08-21 ENCOUNTER — Ambulatory Visit (HOSPITAL_COMMUNITY): Admission: RE | Admit: 2019-08-21 | Payer: 59 | Source: Home / Self Care | Admitting: Obstetrics & Gynecology

## 2019-08-21 SURGERY — LIGATION, FALLOPIAN TUBE, LAPAROSCOPIC
Anesthesia: General | Laterality: Bilateral

## 2019-08-22 ENCOUNTER — Other Ambulatory Visit: Payer: Self-pay

## 2019-08-22 ENCOUNTER — Encounter: Payer: Self-pay | Admitting: Women's Health

## 2019-08-22 ENCOUNTER — Ambulatory Visit (INDEPENDENT_AMBULATORY_CARE_PROVIDER_SITE_OTHER): Payer: 59 | Admitting: Women's Health

## 2019-08-22 DIAGNOSIS — O99893 Other specified diseases and conditions complicating puerperium: Secondary | ICD-10-CM

## 2019-08-22 DIAGNOSIS — K4090 Unilateral inguinal hernia, without obstruction or gangrene, not specified as recurrent: Secondary | ICD-10-CM

## 2019-08-22 DIAGNOSIS — Z30013 Encounter for initial prescription of injectable contraceptive: Secondary | ICD-10-CM

## 2019-08-22 MED ORDER — MEDROXYPROGESTERONE ACETATE 150 MG/ML IM SUSP
150.0000 mg | Freq: Once | INTRAMUSCULAR | Status: AC
  Administered 2019-08-22: 150 mg via INTRAMUSCULAR

## 2019-08-22 MED ORDER — MEDROXYPROGESTERONE ACETATE 150 MG/ML IM SUSP
150.0000 mg | INTRAMUSCULAR | 3 refills | Status: DC
Start: 2019-08-22 — End: 2020-10-19

## 2019-08-22 MED ORDER — MEGESTROL ACETATE 40 MG PO TABS
ORAL_TABLET | ORAL | 1 refills | Status: DC
Start: 2019-08-22 — End: 2020-03-31

## 2019-08-22 NOTE — Addendum Note (Signed)
Addended by: Dwyane Dee F on: 08/22/2019 12:14 PM   Modules accepted: Orders

## 2019-08-22 NOTE — Progress Notes (Signed)
POSTPARTUM VISIT Patient name: Deanna Hughes MRN [id number]  Date of birth: [birth year] Chief Complaint:   Postpartum Care  History of Present Illness:   Deanna Hughes is a 29 y.o. [phone] African American female being seen today for a postpartum visit. She is 6 weeks postpartum following a spontaneous vaginal delivery at 40.0 gestational weeks. Anesthesia: epidural.  I have fully reviewed the prenatal and intrapartum course. Pregnancy uncomplicated. Was supposed to get in patient BTL, but baby was having problems choking and no one was there to watch baby. Was scheduled for BTL yesterday, but had to cancel b/c doesn't have anymore FMLA w/ work. Postpartum course has been complicated by pain Rt inguinal area where she had hernia repair previously. Passed 'large chunk' of placenta after she got home. Bleeding no bleeding. Bowel function is normal. Bladder function is normal.  Patient is not sexually active. Last sexual activity: prior to birth of baby.    Patient's last menstrual period was 09/23/2018 (exact date).  Baby's course has been complicated by choking episodes, changed formula x 3, has reflux, seeing ENT MD. Deanna Hughes is feeding by bottle    Edinburgh Postpartum Depression Screening:   Edinburgh Postnatal Depression Scale - 08/22/19 1103      Edinburgh Postnatal Depression Scale:  In the Past 7 Days   I have been able to laugh and see the funny side of things. 0    I have looked forward with enjoyment to things. 0    I have blamed myself unnecessarily when things went wrong. 0    I have been anxious or worried for no good reason. 0    I have felt scared or panicky for no good reason. 0    Things have been getting on top of me. 0    I have been so unhappy that I have had difficulty sleeping. 0    I have felt sad or miserable. 0    I have been so unhappy that I have been crying. 0    The thought of harming myself has occurred to me. 0    Edinburgh Postnatal Depression Scale Total  0          Review of Systems:   Pertinent items are noted in HPI Denies Abnormal vaginal discharge w/ itching/odor/irritation, headaches, visual changes, shortness of breath, chest pain, abdominal pain, severe nausea/vomiting, or problems with urination or bowel movements. Pertinent History Reviewed:  Reviewed past medical,surgical, obstetrical and family history.  Reviewed problem list, medications and allergies. OB History  Gravida Para Term Preterm AB Living  '6 4 4 '$ 0 2 4  SAB TAB Ectopic Multiple Live Births  2 0 0 0 4    # Outcome Date GA Lbr Len/2nd Weight Sex Delivery Anes PTL Lv  6 Term 07/10/19 38w0d09:56 / 00:08 9 lb 11 oz (4.394 kg) F Vag-Spont EPI  LIV  5 SAB 05/06/16             Birth Comments: System Generated. Please review and update pregnancy details.  4 Term 04/10/16 421w1d4:06 / 01:07 7 lb 14.1 oz (3.575 kg) F Vag-Spont EPI  LIV  3 Term 07/21/11 414w1d:15 / 01:05 9 lb 5 oz (4.224 kg) M Vag-Spont EPI N LIV     Birth Comments: No problems at birth  2 SAB 2012             Birth Comments: System Generated. Please review and update pregnancy details.  1 Term 02/22/09 41w27w0d  24:00 7 lb 10 oz (3.459 kg) F Vag-Spont EPI N LIV    Obstetric Comments  1st Menstrual Cycle:  15   1st Pregnancy:  18   Physical Assessment:   Vitals:   08/22/19 1102  BP: 119/74  Pulse: 83  Weight: 164 lb (74.4 kg)  Height: 5\' 2"  (1.575 m)  Body mass index is 30 kg/m.       Physical Examination:   General appearance: alert, well appearing, and in no distress  Mental status: alert, oriented to person, place, and time  Skin: warm & dry, ? Resolving boil upper mons, no s/s infection   Cardiovascular: normal heart rate noted   Respiratory: normal respiratory effort, no distress   Breasts: deferred, no complaints   Abdomen: soft, non-tender   Pelvic: VULVA: normal appearing vulva with no masses, tenderness or lesions, CERVIX: no CMT UTERUS: well involuted  Rt inguinal area w/ + pain  to palpation at site of previous repair  Rectal: no hemorrhoids  Extremities: no edema       No results found for this or any previous visit (from the past 24 hour(s)).  Assessment & Plan:  1) Postpartum exam 2) 6 wks s/p SVB 3) Bottlefeeding 4) Depression screening 5) Contraception counseling 6) Rt inguinal hernia>make appt w/ surgeon who fixed the first time  Essential components of care per ACOG recommendations:  1.  Mood and well being:  . Patient with negative depression screening today. . Pre-existing mental health disorders? No  . Patient does not use tobacco. . Substance use disorder? No   2. Infant care and feeding:  . Patient currently breastfeeding? No  . Infant has a pediatrician/family doctor? Yes  . Recommended that all caregivers be immunized for flu, pertussis and other preventable communicable diseases . Pt does have material needs met such as stable housing, utilities, food and diapers. If not, referred to local resources for help obtaining these.  3. Sexuality, contraception and birth spacing . Provided guidance regarding sexuality, management of dyspareunia, and resumption of intercourse . Patient does not want a pregnancy in the near future.  Desired family size is 4 children.  . Discussed avoiding interpregnancy interval <24mths and recommended birth spacing of 18 months . Reviewed forms of contraception. Patient desires depo for now, may want IUD.  Wants rx for megace, b/c always has irregular bleeding w/ depo that requires megace  4. Sleep and fatigue . Discussed coping options for fatigue and sleep disruption . Encouraged family/partner/community support of 4 hrs of uninterrupted sleep to help with mood and fatigue  5. Physical recovery  . Pt did not have a cesarean section.  . Patient had a n/a laceration, perineal healing and pain reviewed.  . Patient has urinary incontinence? No, fecal incontinence? No  . Patient is safe to resume physical activity.  Discussed attainment of healthy weight.  6.  Chronic disease management . Discussed pregnancy complications if any, and their implications for future childbearing and long-term maternal health. . Review recommendations for prevention of recurrent pregnancy complications, such as 17 hydroxyprogesterone caproate to reduce risk for recurrent PTB not applicable, or aspirin to reduce risk of preeclampsia not applicable. . Pt had GDM: No. If yes, 2hr GTT scheduled: not applicable. . Reviewed medications and non-pregnant dosing including consideration of whether pt is breastfeeding using a reliable resource such as LactMed: not applicable . Referred for f/u w/ PCP or subspecialist providers as indicated: yes  7. Health maintenance . Last pap smear 09/28/17 and results  were normal . Mammogram at 27yo or earlier if indicated  Meds:  Meds ordered this encounter  Medications  . megestrol (MEGACE) 40 MG tablet    Sig: 3x5d, 2x5d, then 1 daily to help control vaginal bleeding. Stop taking when bleeding stops.    Dispense:  45 tablet    Refill:  1    Order Specific Question:   Supervising Provider    Answer:   Elonda Husky, LUTHER H [[id number]]  . medroxyPROGESTERone (DEPO-PROVERA) 150 MG/ML injection    Sig: Inject 1 mL (150 mg total) into the muscle every 3 (three) months.    Dispense:  1 mL    Refill:  3    Order Specific Question:   Supervising Provider    Answer:   Florian Buff [[id number]]    Follow-up: Return for 11-13wks for next depo, then 25yrfor pap & physical.   Orders Placed This Encounter  Procedures  . POCT urine pregnancy    KEaston W[facility]8/05/2019 11:59 AM

## 2019-08-22 NOTE — Patient Instructions (Signed)
Medroxyprogesterone injection [Contraceptive] What is this medicine? MEDROXYPROGESTERONE (me DROX ee proe JES te rone) contraceptive injections prevent pregnancy. They provide effective birth control for 3 months. Depo-subQ Provera 104 is also used for treating pain related to endometriosis. This medicine may be used for other purposes; ask your health care provider or pharmacist if you have questions. COMMON BRAND NAME(S): Depo-Provera, Depo-subQ Provera 104 What should I tell my health care provider before I take this medicine? They need to know if you have any of these conditions:  frequently drink alcohol  asthma  blood vessel disease or a history of a blood clot in the lungs or legs  bone disease such as osteoporosis  breast cancer  diabetes  eating disorder (anorexia nervosa or bulimia)  high blood pressure  HIV infection or AIDS  kidney disease  liver disease  mental depression  migraine  seizures (convulsions)  stroke  tobacco smoker  vaginal bleeding  an unusual or allergic reaction to medroxyprogesterone, other hormones, medicines, foods, dyes, or preservatives  pregnant or trying to get pregnant  breast-feeding How should I use this medicine? Depo-Provera Contraceptive injection is given into a muscle. Depo-subQ Provera 104 injection is given under the skin. These injections are given by a health care professional. You must not be pregnant before getting an injection. The injection is usually given during the first 5 days after the start of a menstrual period or 6 weeks after delivery of a baby. Talk to your pediatrician regarding the use of this medicine in children. Special care may be needed. These injections have been used in female children who have started having menstrual periods. Overdosage: If you think you have taken too much of this medicine contact a poison control center or emergency room at once. NOTE: This medicine is only for you. Do not  share this medicine with others. What if I miss a dose? Try not to miss a dose. You must get an injection once every 3 months to maintain birth control. If you cannot keep an appointment, call and reschedule it. If you wait longer than 13 weeks between Depo-Provera contraceptive injections or longer than 14 weeks between Depo-subQ Provera 104 injections, you could get pregnant. Use another method for birth control if you miss your appointment. You may also need a pregnancy test before receiving another injection. What may interact with this medicine? Do not take this medicine with any of the following medications:  bosentan This medicine may also interact with the following medications:  aminoglutethimide  antibiotics or medicines for infections, especially rifampin, rifabutin, rifapentine, and griseofulvin  aprepitant  barbiturate medicines such as phenobarbital or primidone  bexarotene  carbamazepine  medicines for seizures like ethotoin, felbamate, oxcarbazepine, phenytoin, topiramate  modafinil  St. John's wort This list may not describe all possible interactions. Give your health care provider a list of all the medicines, herbs, non-prescription drugs, or dietary supplements you use. Also tell them if you smoke, drink alcohol, or use illegal drugs. Some items may interact with your medicine. What should I watch for while using this medicine? This drug does not protect you against HIV infection (AIDS) or other sexually transmitted diseases. Use of this product may cause you to lose calcium from your bones. Loss of calcium may cause weak bones (osteoporosis). Only use this product for more than 2 years if other forms of birth control are not right for you. The longer you use this product for birth control the more likely you will be at risk   for weak bones. Ask your health care professional how you can keep strong bones. You may have a change in bleeding pattern or irregular periods.  Many females stop having periods while taking this drug. If you have received your injections on time, your chance of being pregnant is very low. If you think you may be pregnant, see your health care professional as soon as possible. Tell your health care professional if you want to get pregnant within the next year. The effect of this medicine may last a long time after you get your last injection. What side effects may I notice from receiving this medicine? Side effects that you should report to your doctor or health care professional as soon as possible:  allergic reactions like skin rash, itching or hives, swelling of the face, lips, or tongue  breast tenderness or discharge  breathing problems  changes in vision  depression  feeling faint or lightheaded, falls  fever  pain in the abdomen, chest, groin, or leg  problems with balance, talking, walking  unusually weak or tired  yellowing of the eyes or skin Side effects that usually do not require medical attention (report to your doctor or health care professional if they continue or are bothersome):  acne  fluid retention and swelling  headache  irregular periods, spotting, or absent periods  temporary pain, itching, or skin reaction at site where injected  weight gain This list may not describe all possible side effects. Call your doctor for medical advice about side effects. You may report side effects to FDA at 1-800-FDA-1088. Where should I keep my medicine? This does not apply. The injection will be given to you by a health care professional. NOTE: This sheet is a summary. It may not cover all possible information. If you have questions about this medicine, talk to your doctor, pharmacist, or health care provider.  2020 Elsevier/Gold Standard (2008-01-25 18:37:56)  

## 2019-08-30 ENCOUNTER — Encounter: Payer: 59 | Admitting: Obstetrics & Gynecology

## 2019-10-03 ENCOUNTER — Encounter: Payer: Self-pay | Admitting: Family Medicine

## 2019-10-03 ENCOUNTER — Telehealth (INDEPENDENT_AMBULATORY_CARE_PROVIDER_SITE_OTHER): Payer: 59 | Admitting: Family Medicine

## 2019-10-03 VITALS — BP 119/74 | Ht 62.0 in | Wt 164.0 lb

## 2019-10-03 DIAGNOSIS — G43719 Chronic migraine without aura, intractable, without status migrainosus: Secondary | ICD-10-CM | POA: Diagnosis not present

## 2019-10-03 DIAGNOSIS — H6982 Other specified disorders of Eustachian tube, left ear: Secondary | ICD-10-CM | POA: Diagnosis not present

## 2019-10-03 DIAGNOSIS — H9012 Conductive hearing loss, unilateral, left ear, with unrestricted hearing on the contralateral side: Secondary | ICD-10-CM | POA: Diagnosis not present

## 2019-10-03 DIAGNOSIS — R21 Rash and other nonspecific skin eruption: Secondary | ICD-10-CM | POA: Diagnosis not present

## 2019-10-03 DIAGNOSIS — J310 Chronic rhinitis: Secondary | ICD-10-CM | POA: Diagnosis not present

## 2019-10-03 MED ORDER — AMITRIPTYLINE HCL 10 MG PO TABS: 10.0000 mg | ORAL_TABLET | Freq: Every day | ORAL | 0 refills | Status: DC

## 2019-10-03 NOTE — Assessment & Plan Note (Signed)
Advised to pick up some benadryl cream for use topically over stretch mark rash. Most likely a contact dermatitis, though unsure of cause. Patient acknowledged agreement and understanding of the plan.

## 2019-10-03 NOTE — Progress Notes (Signed)
Virtual Visit via Video Note   This visit type was conducted due to national recommendations for restrictions regarding the COVID-19 Pandemic (e.g. social distancing) in an effort to limit this patient's exposure and mitigate transmission in our community.  Due to her co-morbid illnesses, this patient is at least at moderate risk for complications without adequate follow up.  This format is felt to be most appropriate for this patient at this time.  All issues noted in this document were discussed and addressed.  A limited physical exam was performed with this format.      Evaluation Performed:  Follow-up visit  Date:  10/03/2019   ID:  Deanna Hughes, DOB 04-01-1990, MRN [id number]  Patient Location: Home Provider Location: Office/Clinic  Location of Patient: Home Location of Provider: Telehealth Consent was obtain for visit to be over via telehealth. I verified that I am speaking with the correct person using two identifiers.  PCP:  Perlie Mayo, NP   Chief Complaint: Acute visit with several concerns including migraines  History of Present Illness:    Deanna Hughes is a 29 y.o. female with history of anxiety, chronic migraines recently had a baby was unable to take her medication she was using for migraines would like to get back on this as she continues to have migraines now for the last month.  Additionally she reports that her foot started swelling a couple days back.  She has just recently started back to work.  And is on her feet a lot more.  She denies having any falls or injuries.  Little bit of swelling no redness. Additionally she reports a stomach has a rash where she had stretch marks.  Has not had changes in personal hygiene or products used. Has not tried any over-the-counter cream for this. She also reports that her left ear stopped up.  However this is a virtual visit unable to visualize ear.  But she reports that she Is going to an ENT.  Is not currently  taking anything for allergies at this time.  Denies having any chest pain, heart palpitations, leg swelling, dizziness or vision changes.  The patient does not have symptoms concerning for COVID-19 infection (fever, chills, cough, or new shortness of breath).   Past Medical, Surgical, Social History, Allergies, and Medications have been Reviewed.  Past Medical History:  Diagnosis Date  . Anxiety   . BV (bacterial vaginosis) 04/02/2015  . Chlamydia    treated 6/6  . Chronic back pain   . Chronic headaches    MIGRAINES  . Contraceptive management 01/25/2013  . Encounter for Nexplanon removal 02/27/2015  . Irregular bleeding 06/03/2013  . Miscarriage 03/27/2013  . Nexplanon insertion 04/11/2013   nexplanon inserted 04/11/13 remove 3/32/18  . Right sided abdominal pain   . Scoliosis   . Seasonal allergies 07/03/2012  . Sinus infection 08/27/2013  . Supervision of normal pregnancy 08/27/2015    [facility] Initiated Care at   7+5 weeks  FOB  Dating By  LMP  Pap  GC/CT Initial:                36+wks: Genetic Screen NT/IT:  CF screen  Anatomic [place]  Flu vaccine  Tdap Recommended ~ 28wks Glucose Screen  2 hr GBS  Feed Preference  Contraception  Circumcision  Childbirth Classes  Pediatrician    . Threatened abortion in early pregnancy 03/20/2013  . Vaginal discharge 10/03/2012  . Yeast infection 07/03/2012  Past Surgical History:  Procedure Laterality Date  . BIOPSY  09/18/2018   Procedure: BIOPSY;  Surgeon: Danie Binder, MD;  [facility];  Service: Endoscopy;;  . COLONOSCOPY WITH PROPOFOL N/A 09/18/2018   Procedure: COLONOSCOPY WITH PROPOFOL;  Surgeon: Danie Binder, MD;  Location: [facility];  Service: Endoscopy;  Laterality: N/A;  8:30am  . FLEXIBLE SIGMOIDOSCOPY N/A 10/05/2018   Procedure: FLEXIBLE SIGMOIDOSCOPY;  Surgeon: Danie Binder, MD;  Location: [facility];  Service: Endoscopy;  Laterality: N/A;  8:30  . HEMORRHOID BANDING N/A 10/05/2018   Procedure: Thayer Jew;  Surgeon: Danie Binder, MD;  Location: [facility];  Service: Endoscopy;  Laterality: N/A;  . HERNIA REPAIR    . INGUINAL HERNIA REPAIR Right 04/19/2017   Procedure: HERNIA REPAIR INGUINAL ADULT;  Surgeon: Robert Bellow, MD;  Location: [facility];  Service: General;  Laterality: Right;  . WISDOM TOOTH EXTRACTION       Current Meds  Medication Sig  . medroxyPROGESTERone (DEPO-PROVERA) 150 MG/ML injection Inject 1 mL (150 mg total) into the muscle every 3 (three) months.  . megestrol (MEGACE) 40 MG tablet 3x5d, 2x5d, then 1 daily to help control vaginal bleeding. Stop taking when bleeding stops.     Allergies:   Aspirin, Advil [ibuprofen], and Latex   ROS:   Please see the history of present illness.    All other systems reviewed and are negative.   Labs/Other Tests and Data Reviewed:    Recent Labs: 07/10/2019: Hemoglobin 12.3; Platelets 139   Recent Lipid Panel No results found for: CHOL, TRIG, HDL, CHOLHDL, LDLCALC, LDLDIRECT  Wt Readings from Last 3 Encounters:  10/03/19 164 lb (74.4 kg)  08/22/19 164 lb (74.4 kg)  07/16/19 159 lb (72.1 kg)     Objective:    Vital Signs:  BP 119/74   Ht 5\' 2"  (1.575 m)   Wt 164 lb (74.4 kg)   BMI 30.00 kg/m    VITAL SIGNS:  reviewed GEN:  no acute distress EYES:  sclerae anicteric, EOMI - Extraocular Movements Intact RESPIRATORY:  normal respiratory effort, symmetric expansion SKIN:  no rash, lesions or ulcers. MUSCULOSKELETAL:  no obvious deformities. NEURO:  alert and oriented x 3, no obvious focal deficit PSYCH:  normal affect  ASSESSMENT & PLAN:   1. Intractable chronic migraine without aura and without status migrainosus - amitriptyline (ELAVIL) 10 MG tablet; Take 1 tablet (10 mg total) by mouth at bedtime.  Dispense: 30 tablet; Refill: 0  2. Rash and nonspecific skin eruption  Time:   Today, I have spent 7 minutes with the patient with telehealth technology discussing the above problems.      Medication Adjustments/Labs and Tests Ordered: Current medicines are reviewed at length with the patient today.  Concerns regarding medicines are outlined above.   Tests Ordered: No orders of the defined types were placed in this encounter.   Medication Changes: No orders of the defined types were placed in this encounter.   Disposition:  Follow up 4 weeks Signed, Perlie Mayo, NP  10/03/2019 11:25 AM     [place]

## 2019-10-03 NOTE — Assessment & Plan Note (Signed)
Will restart Elavil 10 mg for 4 weeks and increase to 25 mg then to 50 mg where she was prior to pregnancy. Patient acknowledged agreement and understanding of the plan.   Reviewed side effects, risks and benefits of medication.

## 2019-10-03 NOTE — Patient Instructions (Signed)
I appreciate the opportunity to provide you with care for your health and wellness. Today we discussed: migraines and several other concerns  Follow up: 4 weeks for migraines and medication adjustment   No labs or referrals today  Benadryl cream for rash.  Restart migraine medication.  Please continue to practice social distancing to keep you, your family, and our community safe.  If you must go out, please wear a mask and practice good handwashing.  It was a pleasure to see you and I look forward to continuing to work together on your health and well-being. Please do not hesitate to call the office if you need care or have questions about your care.  Have a wonderful day and week. With Gratitude, Cherly Beach, DNP, AGNP-BC

## 2019-10-07 ENCOUNTER — Ambulatory Visit: Payer: 59 | Admitting: Podiatry

## 2019-10-23 ENCOUNTER — Ambulatory Visit: Payer: 59 | Admitting: Family Medicine

## 2019-10-24 ENCOUNTER — Encounter: Payer: Self-pay | Admitting: Family Medicine

## 2019-10-31 ENCOUNTER — Encounter: Payer: 59 | Admitting: Family Medicine

## 2019-11-07 ENCOUNTER — Encounter: Payer: 59 | Admitting: Family Medicine

## 2019-11-14 ENCOUNTER — Ambulatory Visit: Payer: 59

## 2019-11-15 ENCOUNTER — Other Ambulatory Visit: Payer: Self-pay

## 2019-11-15 ENCOUNTER — Ambulatory Visit (INDEPENDENT_AMBULATORY_CARE_PROVIDER_SITE_OTHER): Payer: 59 | Admitting: *Deleted

## 2019-11-15 DIAGNOSIS — Z3042 Encounter for surveillance of injectable contraceptive: Secondary | ICD-10-CM

## 2019-11-15 MED ORDER — MEDROXYPROGESTERONE ACETATE 150 MG/ML IM SUSP
150.0000 mg | Freq: Once | INTRAMUSCULAR | Status: AC
  Administered 2019-11-15: 150 mg via INTRAMUSCULAR

## 2019-11-15 NOTE — Progress Notes (Signed)
   NURSE VISIT- INJECTION  SUBJECTIVE:  Deanna Hughes is a 29 y.o. [phone] female here for a Depo Provera for contraception/period management. She is a GYN patient.   OBJECTIVE:  There were no vitals taken for this visit.  Appears well, in no apparent distress  Injection administered in: Right upper quad. gluteus  No orders of the defined types were placed in this encounter.   ASSESSMENT: GYN patient Depo Provera for contraception/period management PLAN: Follow-up: in 11-13 weeks for next Depo   Deanna Hughes  11/15/2019 9:58 AM

## 2019-12-01 ENCOUNTER — Ambulatory Visit
Admission: EM | Admit: 2019-12-01 | Discharge: 2019-12-01 | Disposition: A | Discharge status: Home / Self Care | Payer: Medicaid Other | Attending: Emergency Medicine | Admitting: Emergency Medicine

## 2019-12-01 ENCOUNTER — Other Ambulatory Visit: Payer: Self-pay

## 2019-12-01 DIAGNOSIS — N898 Other specified noninflammatory disorders of vagina: Secondary | ICD-10-CM | POA: Diagnosis not present

## 2019-12-01 DIAGNOSIS — R300 Dysuria: Secondary | ICD-10-CM | POA: Diagnosis not present

## 2019-12-01 LAB — POCT URINALYSIS DIP (MANUAL ENTRY)
Bilirubin, UA: NEGATIVE
Blood, UA: NEGATIVE
Glucose, UA: NEGATIVE mg/dL
Ketones, POC UA: NEGATIVE mg/dL
Leukocytes, UA: NEGATIVE
Nitrite, UA: NEGATIVE
Spec Grav, UA: >=1.03 — AB (ref 1.010–1.025)
Urobilinogen, UA: 0.2 E.U./dL
pH, UA: 5.5 (ref 5.0–8.0)

## 2019-12-01 LAB — POCT URINE PREGNANCY: Preg Test, Ur: NEGATIVE

## 2019-12-01 NOTE — Discharge Instructions (Signed)
Vaginal self-swab obtained.  We will follow up with you regarding abnormal results Declines treatment for BV today If tests results are positive, please abstain from sexual activity until you and your partner(s) have been treated Follow up with PCP as needed Return here or go to ER if you have any new or worsening symptoms fever, chills, nausea, vomiting, abdominal or pelvic pain, painful intercourse, vaginal discharge, vaginal bleeding, persistent symptoms despite treatment, etc..Marland Kitchen

## 2019-12-01 NOTE — ED Triage Notes (Signed)
Pt presents with c/o foul smelling urine and discomfort for past few days

## 2019-12-01 NOTE — ED Provider Notes (Signed)
[place]   [id number] 12/01/19 Arrival Time: 1528   WG:[id number] odor, discomfort with urination  SUBJECTIVE:  MAHATI VAJDA is a 29 y.o. female who presents with complaints of intermittent discomfort with urination and abnormal vaginal odor x 2-3 days.  She denies a precipitating event, recent sexual encounter or recent antibiotic use.  Denies alleviating or aggravating factors.  She reports similar symptoms in the past with BV.  She denies fever, chills, nausea, vomiting, abdominal or pelvic pain, vaginal discharge, vaginal itching, vaginal bleeding, dyspareunia, vaginal rashes or lesions.   No LMP recorded.  ROS: As per HPI.  All other pertinent ROS negative.     Past Medical History:  Diagnosis Date  . Anxiety   . BV (bacterial vaginosis) 04/02/2015  . Chlamydia    treated 6/6  . Chronic back pain   . Chronic headaches    MIGRAINES  . Contraceptive management 01/25/2013  . Encounter for Nexplanon removal 02/27/2015  . Irregular bleeding 06/03/2013  . Mid back pain 05/17/2018  . Miscarriage 03/27/2013  . Neck pain 05/17/2018  . Nexplanon insertion 04/11/2013   nexplanon inserted 04/11/13 remove 3/32/18  . Right inguinal hernia 12/14/2015  . Right sided abdominal pain   . Scoliosis   . Seasonal allergies 07/03/2012  . Sinus infection 08/27/2013  . Supervision of normal pregnancy 08/27/2015    [facility] Initiated Care at   7+5 weeks  FOB  Dating By  LMP  Pap  GC/CT Initial:                36+wks: Genetic Screen NT/IT:  CF screen  Anatomic [place]  Flu vaccine  Tdap Recommended ~ 28wks Glucose Screen  2 hr GBS  Feed Preference  Contraception  Circumcision  Childbirth Classes  Pediatrician    . Threatened abortion in early pregnancy 03/20/2013  . Vaginal discharge 10/03/2012  . Yeast infection 07/03/2012   Past Surgical History:  Procedure Laterality Date  . BIOPSY  09/18/2018   Procedure: BIOPSY;  Surgeon: Danie Binder, MD;  [facility];  Service:  Endoscopy;;  . COLONOSCOPY WITH PROPOFOL N/A 09/18/2018   Procedure: COLONOSCOPY WITH PROPOFOL;  Surgeon: Danie Binder, MD;  Location: [facility];  Service: Endoscopy;  Laterality: N/A;  8:30am  . FLEXIBLE SIGMOIDOSCOPY N/A 10/05/2018   Procedure: FLEXIBLE SIGMOIDOSCOPY;  Surgeon: Danie Binder, MD;  Location: [facility];  Service: Endoscopy;  Laterality: N/A;  8:30  . HEMORRHOID BANDING N/A 10/05/2018   Procedure: Thayer Jew;  Surgeon: Danie Binder, MD;  Location: [facility];  Service: Endoscopy;  Laterality: N/A;  . HERNIA REPAIR    . INGUINAL HERNIA REPAIR Right 04/19/2017   Procedure: HERNIA REPAIR INGUINAL ADULT;  Surgeon: Robert Bellow, MD;  Location: [facility];  Service: General;  Laterality: Right;  . WISDOM TOOTH EXTRACTION     Allergies  Allergen Reactions  . Aspirin Shortness Of Breath and Other (See Comments)    Heart beats fast  . Advil [Ibuprofen] Other (See Comments)    Breakout, heart palpitations and trouble breathing.   . Latex Rash   No current facility-administered medications on file prior to encounter.   Current Outpatient Medications on File Prior to Encounter  Medication Sig Dispense Refill  . amitriptyline (ELAVIL) 10 MG tablet Take 1 tablet (10 mg total) by mouth at bedtime. 30 tablet 0  . medroxyPROGESTERone (DEPO-PROVERA) 150 MG/ML injection Inject 1 mL (150 mg total) into the muscle every 3 (  three) months. 1 mL 3  . megestrol (MEGACE) 40 MG tablet 3x5d, 2x5d, then 1 daily to help control vaginal bleeding. Stop taking when bleeding stops. 45 tablet 1    Social History   Socioeconomic History  . Marital status: Single    Spouse name: Not on file  . Number of children: 3  . Years of education: Not on file  . Highest education level: High school graduate  Occupational History    Comment: [employer], cafeteria  Tobacco Use  . Smoking status: Never Smoker  . Smokeless tobacco: Never Used  Vaping Use  . Vaping Use: Never used    Substance and Sexual Activity  . Alcohol use: No  . Drug use: No  . Sexual activity: Not Currently    Birth control/protection: None  Other Topics Concern  . Not on file  Social History Narrative   Lives with her 3 children      Zimiah (8)    Zatavious (6)      Zayla (1)- Daddy is present in life.       Social Determinants of Health   Financial Resource Strain:   . Difficulty of Paying Living Expenses: Not on file  Food Insecurity:   . Worried About Charity fundraiser in the Last Year: Not on file  . Ran Out of Food in the Last Year: Not on file  Transportation Needs:   . Lack of Transportation (Medical): Not on file  . Lack of Transportation (Non-Medical): Not on file  Physical Activity:   . Days of Exercise per Week: Not on file  . Minutes of Exercise per Session: Not on file  Stress:   . Feeling of Stress : Not on file  Social Connections:   . Frequency of Communication with Friends and Family: Not on file  . Frequency of Social Gatherings with Friends and Family: Not on file  . Attends Religious Services: Not on file  . Active Member of Clubs or Organizations: Not on file  . Attends Archivist Meetings: Not on file  . Marital Status: Not on file  Intimate Partner Violence:   . Fear of Current or Ex-Partner: Not on file  . Emotionally Abused: Not on file  . Physically Abused: Not on file  . Sexually Abused: Not on file   Family History  Problem Relation Age of Onset  . Asthma Mother   . Asthma Sister   . Asthma Brother   . Asthma Daughter   . Diabetes Maternal Grandmother   . Hypertension Maternal Grandmother   . Asthma Maternal Grandmother   . Heart disease Maternal Grandmother   . Stroke Maternal Grandmother   . Hypertension Maternal Grandfather   . Asthma Maternal Grandfather   . Heart disease Maternal Grandfather   . Asthma Son   . Colon cancer Neg Hx   . Colon polyps Neg Hx     OBJECTIVE:  Vitals:   12/01/19 1543  BP: 118/73   Pulse: 76  Resp: 17  Temp: 98.3 F (36.8 C)  TempSrc: Tympanic  SpO2: 98%     General appearance: Alert, NAD, appears stated age Head: NCAT Throat: lips, mucosa, and tongue normal; teeth and gums normal Lungs: CTA bilaterally without adventitious breath sounds Heart: regular rate and rhythm.   Back: no CVA tenderness Abdomen: soft, non-tender; bowel sounds normal; no guarding GU: deferred Skin: warm and dry Psychological:  Alert and cooperative. Normal mood and affect.  LABS:  Results for orders placed  or performed during the hospital encounter of 12/01/19  POCT urinalysis dipstick  Result Value Ref Range   Color, UA yellow yellow   Clarity, UA clear clear   Glucose, UA negative negative mg/dL   Bilirubin, UA negative negative   Ketones, POC UA negative negative mg/dL   Spec Grav, UA >=1.030 (A) 1.010 - 1.025   Blood, UA negative negative   pH, UA 5.5 5.0 - 8.0   Protein Ur, POC trace (A) negative mg/dL   Urobilinogen, UA 0.2 0.2 or 1.0 E.U./dL   Nitrite, UA Negative Negative   Leukocytes, UA Negative Negative  POCT urine pregnancy  Result Value Ref Range   Preg Test, Ur Negative Negative    Labs Reviewed  POCT URINALYSIS DIP (MANUAL ENTRY) - Abnormal; Notable for the following components:      Result Value   Spec Grav, UA >=1.030 (*)    Protein Ur, POC trace (*)    All other components within normal limits  URINE CULTURE  POCT URINE PREGNANCY  CERVICOVAGINAL ANCILLARY ONLY    ASSESSMENT & PLAN:  1. Vaginal odor   2. Dysuria    Pending: Labs Reviewed  POCT URINALYSIS DIP (MANUAL ENTRY) - Abnormal; Notable for the following components:      Result Value   Spec Grav, UA >=1.030 (*)    Protein Ur, POC trace (*)    All other components within normal limits  URINE CULTURE  POCT URINE PREGNANCY  CERVICOVAGINAL ANCILLARY ONLY    Vaginal self-swab obtained.  We will follow up with you regarding abnormal results Declines treatment for BV today If tests  results are positive, please abstain from sexual activity until you and your partner(s) have been treated Follow up with PCP as needed Return here or go to ER if you have any new or worsening symptoms fever, chills, nausea, vomiting, abdominal or pelvic pain, painful intercourse, vaginal discharge, vaginal bleeding, persistent symptoms despite treatment, etc...  Reviewed expectations re: course of current medical issues. Questions answered. Outlined signs and symptoms indicating need for more acute intervention. Patient verbalized understanding. After Visit Summary given.       Lestine Box, PA-C 12/01/19 1628

## 2019-12-03 LAB — CERVICOVAGINAL ANCILLARY ONLY
Bacterial Vaginitis (gardnerella): NEGATIVE
Candida Glabrata: NEGATIVE
Candida Vaginitis: NEGATIVE
Chlamydia: NEGATIVE
Comment: NEGATIVE
Comment: NEGATIVE
Comment: NEGATIVE
Comment: NEGATIVE
Comment: NEGATIVE
Comment: NORMAL
Neisseria Gonorrhea: NEGATIVE
Trichomonas: NEGATIVE

## 2019-12-03 LAB — URINE CULTURE

## 2019-12-05 ENCOUNTER — Ambulatory Visit: Payer: Self-pay | Admitting: Adult Health

## 2019-12-17 ENCOUNTER — Ambulatory Visit: Payer: Medicaid Other | Admitting: Adult Health

## 2019-12-18 ENCOUNTER — Other Ambulatory Visit (HOSPITAL_COMMUNITY)
Admission: RE | Admit: 2019-12-18 | Discharge: 2019-12-18 | Disposition: A | Discharge status: Home / Self Care | Payer: Medicaid Other | Source: Ambulatory Visit | Attending: Adult Health | Admitting: Adult Health

## 2019-12-18 ENCOUNTER — Other Ambulatory Visit: Payer: Self-pay

## 2019-12-18 ENCOUNTER — Encounter: Payer: Self-pay | Admitting: Advanced Practice Midwife

## 2019-12-18 ENCOUNTER — Ambulatory Visit (INDEPENDENT_AMBULATORY_CARE_PROVIDER_SITE_OTHER): Payer: Medicaid Other | Admitting: Advanced Practice Midwife

## 2019-12-18 VITALS — BP 113/74 | HR 95 | Wt 183.8 lb

## 2019-12-18 DIAGNOSIS — N760 Acute vaginitis: Secondary | ICD-10-CM

## 2019-12-18 DIAGNOSIS — R399 Unspecified symptoms and signs involving the genitourinary system: Secondary | ICD-10-CM

## 2019-12-18 DIAGNOSIS — R8290 Unspecified abnormal findings in urine: Secondary | ICD-10-CM | POA: Diagnosis not present

## 2019-12-18 LAB — POCT URINALYSIS DIPSTICK OB
Blood, UA: NEGATIVE
Glucose, UA: NEGATIVE
Ketones, UA: NEGATIVE
Leukocytes, UA: NEGATIVE
Nitrite, UA: NEGATIVE
POC,PROTEIN,UA: NEGATIVE

## 2019-12-18 NOTE — Progress Notes (Signed)
GYN VISIT Patient name: Deanna Hughes MRN [id number]  Date of birth: 1990/07/15 Chief Complaint:   Urinary Tract Infection and Vaginitis  History of Present Illness:   Deanna Hughes is a 29 y.o. [phone] African American female being seen today for urine smelling like a 'chemical.' This has been on-going; she had an Urgent Care visit on 12/01/19 and was told they would need her to give a new sample as it resulted with 'mult species present.' Her SO does not notice that her urine is malodorous. Of note, she is Covid recovered. Denies concerning smell with vag d/c- no itching or irritation.  Depression screen [facility] 2/9 10/03/2019 02/06/2019 12/31/2018 10/10/2018 10/02/2018  Decreased Interest 0 0 1 0 0  Down, Depressed, Hopeless 0 0 1 0 1  PHQ - 2 Score 0 0 2 0 1  Altered sleeping 0 - 0 - 0  Tired, decreased energy 0 - 1 - 3  Change in appetite 0 - 2 - 2  Feeling bad or failure about yourself  0 - 0 - 2  Trouble concentrating 0 - 0 - 0  Moving slowly or fidgety/restless 0 - 0 - 0  Suicidal thoughts 0 - 0 - 0  PHQ-9 Score 0 - 5 - 8  Difficult doing work/chores Not difficult at all - - - Very difficult  Some recent data might be hidden    Patient's last menstrual period was 12/07/2019 (exact date). The current method of family planning is Depo-Provera injections.  Last pap Sept 2019. Results were:  normal Review of Systems:   Pertinent items are noted in HPI Denies fever/chills, dizziness, headaches, visual disturbances, fatigue, shortness of breath, chest pain, abdominal pain, vomiting, abnormal vaginal discharge/itching/odor/irritation, problems with periods, bowel movements, urination, or intercourse unless otherwise stated above.  Pertinent History Reviewed:  Reviewed past medical,surgical, social, obstetrical and family history.  Reviewed problem list, medications and allergies. Physical Assessment:   Vitals:   12/18/19 1203  BP: 113/74  Pulse: 95  Weight: 183 lb 12.8 oz  (83.4 kg)  Body mass index is 33.62 kg/m.       Physical Examination:   General appearance: alert, well appearing, and in no distress  Mental status: alert, oriented to person, place, and time  Skin: warm & dry   Cardiovascular: normal heart rate noted  Respiratory: normal respiratory effort, no distress  Abdomen: soft, non-tender   Pelvic: normal external genitalia, vulva, vagina, cervix, uterus and adnexa  Extremities: no edema   Chaperone: Marcelino Scot    Results for orders placed or performed in visit on 12/18/19 (from the past 24 hour(s))  POC Urinalysis Dipstick OB   Collection Time: 12/18/19 11:58 AM  Result Value Ref Range   Color, UA     Clarity, UA     Glucose, UA Negative Negative   Bilirubin, UA     Ketones, UA neg    Spec Grav, UA     Blood, UA neg    pH, UA     POC,PROTEIN,UA Negative Negative, Trace, Small (1+), Moderate (2+), Large (3+), 4+   Urobilinogen, UA     Nitrite, UA neg    Leukocytes, UA Negative Negative   Appearance     Odor      Assessment & Plan:  1) Malodorous urine> neg UA, will send for culture; will also collect BMET for kidney function; NuSwab also sent; reviewed that if all the labs come back negative, it may be related to long Covid  Meds: No orders of the defined types were placed in this encounter.   Orders Placed This Encounter  Procedures  . Urine Culture  . Basic Metabolic Panel (BMET)  . POC Urinalysis Dipstick OB    Return for Pap & Physical Sept 2022.  Myrtis Ser CNM 12/18/2019 1:31 PM

## 2019-12-19 ENCOUNTER — Encounter: Payer: Self-pay | Admitting: Family Medicine

## 2019-12-19 DIAGNOSIS — R8290 Unspecified abnormal findings in urine: Secondary | ICD-10-CM | POA: Diagnosis not present

## 2019-12-19 LAB — CERVICOVAGINAL ANCILLARY ONLY
Bacterial Vaginitis (gardnerella): NEGATIVE
Candida Glabrata: NEGATIVE
Candida Vaginitis: NEGATIVE
Chlamydia: NEGATIVE
Comment: NEGATIVE
Comment: NEGATIVE
Comment: NEGATIVE
Comment: NEGATIVE
Comment: NORMAL
Neisseria Gonorrhea: NEGATIVE

## 2019-12-20 LAB — BASIC METABOLIC PANEL
BUN/Creatinine Ratio: 17 (ref 9–23)
BUN: 11 mg/dL (ref 6–20)
CO2: 23 mmol/L (ref 20–29)
Calcium: 9.3 mg/dL (ref 8.7–10.2)
Chloride: 105 mmol/L (ref 96–106)
Creatinine, Ser: 0.63 mg/dL (ref 0.57–1.00)
GFR calc Af Amer: 142 mL/min/{1.73_m2} (ref >59)
GFR calc non Af Amer: 123 mL/min/{1.73_m2} (ref >59)
Glucose: 101 mg/dL — ABNORMAL HIGH (ref 65–99)
Potassium: 4.1 mmol/L (ref 3.5–5.2)
Sodium: 141 mmol/L (ref 134–144)

## 2019-12-20 LAB — URINE CULTURE

## 2019-12-20 NOTE — Telephone Encounter (Signed)
It has been over a year since she had these, which means it is considered a new med start and requires a visit. She can do this with [place] or her GYN whichever she would like to get these started back.  Also, if she is having S&S - she needs to leave a sample to make sure infection is not the cause.- so in person visit is most likely needed.

## 2019-12-24 ENCOUNTER — Ambulatory Visit: Payer: Medicaid Other | Admitting: Adult Health

## 2019-12-27 ENCOUNTER — Ambulatory Visit: Payer: Medicaid Other | Admitting: Family Medicine

## 2020-01-02 ENCOUNTER — Other Ambulatory Visit: Payer: Self-pay

## 2020-01-02 ENCOUNTER — Emergency Department (HOSPITAL_COMMUNITY)
Admission: EM | Admit: 2020-01-02 | Discharge: 2020-01-02 | Discharge status: Against Medical Advice | Payer: Medicaid Other

## 2020-01-02 ENCOUNTER — Telehealth: Payer: Self-pay

## 2020-01-02 DIAGNOSIS — K8020 Calculus of gallbladder without cholecystitis without obstruction: Secondary | ICD-10-CM | POA: Diagnosis not present

## 2020-01-02 DIAGNOSIS — K4090 Unilateral inguinal hernia, without obstruction or gangrene, not specified as recurrent: Secondary | ICD-10-CM | POA: Diagnosis not present

## 2020-01-02 DIAGNOSIS — R1084 Generalized abdominal pain: Secondary | ICD-10-CM | POA: Diagnosis not present

## 2020-01-02 DIAGNOSIS — R109 Unspecified abdominal pain: Secondary | ICD-10-CM | POA: Diagnosis not present

## 2020-01-02 NOTE — ED Notes (Signed)
Pt left the facility per registration.

## 2020-01-02 NOTE — Telephone Encounter (Signed)
Pt called stating she was in the ER today and was told she had gallstones. She goes tomorrow for a consult for surgery. She was not given anything for pain, was told to contact her pcp. She wanted to know if there was anything you could prescribe for her. Please advise.

## 2020-01-02 NOTE — Telephone Encounter (Signed)
I do not prescribe controlled substances. She can try tylenol extra strength or ibuprofen and even try them together. I am sorry she is having this go on.

## 2020-01-02 NOTE — Telephone Encounter (Signed)
Pt informed

## 2020-01-03 ENCOUNTER — Ambulatory Visit: Payer: Self-pay | Admitting: Surgery

## 2020-01-03 DIAGNOSIS — K8013 Calculus of gallbladder with acute and chronic cholecystitis with obstruction: Secondary | ICD-10-CM | POA: Diagnosis not present

## 2020-01-04 ENCOUNTER — Other Ambulatory Visit (HOSPITAL_COMMUNITY)
Admission: RE | Admit: 2020-01-04 | Discharge: 2020-01-04 | Disposition: A | Discharge status: Home / Self Care | Payer: Medicaid Other | Source: Ambulatory Visit | Attending: Surgery | Admitting: Surgery

## 2020-01-04 DIAGNOSIS — Z01812 Encounter for preprocedural laboratory examination: Secondary | ICD-10-CM | POA: Insufficient documentation

## 2020-01-04 DIAGNOSIS — Z20822 Contact with and (suspected) exposure to covid-19: Secondary | ICD-10-CM | POA: Diagnosis not present

## 2020-01-04 LAB — SARS CORONAVIRUS 2 (TAT 6-24 HRS): SARS Coronavirus 2: NEGATIVE

## 2020-01-06 ENCOUNTER — Other Ambulatory Visit: Payer: Self-pay

## 2020-01-06 ENCOUNTER — Encounter (HOSPITAL_COMMUNITY): Payer: Self-pay | Admitting: Surgery

## 2020-01-06 NOTE — Progress Notes (Signed)
COVID Vaccine Completed: Date COVID Vaccine completed: COVID vaccine manufacturer: [place]   PCP - Perlie Mayo, NP last office visit 10/03/19 in epic Cardiologist -   Chest x-ray -  EKG - 05/24/19 in epic Stress Test -  ECHO - greater than 2 year in epic Cardiac Cath -  Pacemaker/ICD device last checked:  Sleep Study -  CPAP -   Fasting Blood Sugar -  Checks Blood Sugar _____ times a day  Blood Thinner Instructions: Aspirin Instructions: Last Dose:  Activity level:  Unable to go up a flight of stairs without symptoms   Can go up a flight of stairs without stopping and without symptoms   Able to exercise without symptoms     Anesthesia review:   Patient denies shortness of breath, fever, cough and chest pain at PAT appointment   Patient verbalized understanding of instructions that were given to them at the PAT appointment. Patient was also instructed that they will need to review over the PAT instructions again at home before surgery.

## 2020-01-06 NOTE — Progress Notes (Signed)
COVID Vaccine Completed:  No Date COVID Vaccine completed: COVID vaccine manufacturer: [place]   PCP - Perlie Mayo, NP last office visit 10/03/19 in epic Cardiologist -   Chest x-ray - N/A EKG - 05/24/19 in epic Stress Test - N/A ECHO - greater than 2 year in epic Cardiac Cath - N/A Pacemaker/ICD device last checked:N/A   Sleep Study - N/A  CPAP -   Fasting Blood Sugar - N/A  Checks Blood Sugar _____ times a day  Blood Thinner Instructions:N/A  Aspirin Instructions: Last Dose:  Can go up a flight of stairs without stopping and without symptoms. Able to exercise without symptoms                           Anesthesia review:   Patient denies shortness of breath, fever, cough and chest pain at PAT appointment   Patient verbalized understanding of instructions that were given to them at the PAT appointment. Patient was also instructed that they will need to review over the PAT instructions again at home before surgery.

## 2020-01-07 ENCOUNTER — Ambulatory Visit (HOSPITAL_COMMUNITY): Payer: Medicaid Other | Admitting: Certified Registered Nurse Anesthetist

## 2020-01-07 ENCOUNTER — Encounter (HOSPITAL_COMMUNITY): Payer: Self-pay | Admitting: Surgery

## 2020-01-07 ENCOUNTER — Ambulatory Visit: Payer: Medicaid Other | Admitting: Family Medicine

## 2020-01-07 ENCOUNTER — Encounter (HOSPITAL_COMMUNITY)
Admission: RE | Disposition: A | Discharge status: Home / Self Care | Payer: Self-pay | Source: Home / Self Care | Attending: Surgery

## 2020-01-07 ENCOUNTER — Ambulatory Visit (HOSPITAL_COMMUNITY)
Admission: RE | Admit: 2020-01-07 | Discharge: 2020-01-07 | Disposition: A | Discharge status: Home / Self Care | Payer: Medicaid Other | Attending: Surgery | Admitting: Surgery

## 2020-01-07 DIAGNOSIS — Z793 Long term (current) use of hormonal contraceptives: Secondary | ICD-10-CM | POA: Insufficient documentation

## 2020-01-07 DIAGNOSIS — F418 Other specified anxiety disorders: Secondary | ICD-10-CM | POA: Diagnosis not present

## 2020-01-07 DIAGNOSIS — K8010 Calculus of gallbladder with chronic cholecystitis without obstruction: Secondary | ICD-10-CM | POA: Insufficient documentation

## 2020-01-07 DIAGNOSIS — Z79899 Other long term (current) drug therapy: Secondary | ICD-10-CM | POA: Diagnosis not present

## 2020-01-07 DIAGNOSIS — K648 Other hemorrhoids: Secondary | ICD-10-CM | POA: Diagnosis not present

## 2020-01-07 DIAGNOSIS — G43009 Migraine without aura, not intractable, without status migrainosus: Secondary | ICD-10-CM | POA: Diagnosis not present

## 2020-01-07 DIAGNOSIS — K819 Cholecystitis, unspecified: Secondary | ICD-10-CM | POA: Diagnosis not present

## 2020-01-07 HISTORY — PX: CHOLECYSTECTOMY: SHX55

## 2020-01-07 LAB — PREGNANCY, URINE: Preg Test, Ur: NEGATIVE

## 2020-01-07 SURGERY — LAPAROSCOPIC CHOLECYSTECTOMY
Anesthesia: General | Site: Abdomen

## 2020-01-07 MED ORDER — ACETAMINOPHEN 500 MG PO TABS: 500.0000 mg | ORAL_TABLET | Freq: Four times a day (QID) | ORAL | 0 refills | Status: DC | PRN

## 2020-01-07 MED ORDER — FENTANYL CITRATE (PF) 100 MCG/2ML IJ SOLN
INTRAMUSCULAR | Status: AC
  Administered 2020-01-07: 50 ug via INTRAVENOUS
  Filled 2020-01-07: qty 2

## 2020-01-07 MED ORDER — FENTANYL CITRATE (PF) 100 MCG/2ML IJ SOLN
INTRAMUSCULAR | Status: DC | PRN
  Administered 2020-01-07 (×4): 50 ug via INTRAVENOUS

## 2020-01-07 MED ORDER — ROCURONIUM BROMIDE 10 MG/ML (PF) SYRINGE
PREFILLED_SYRINGE | INTRAVENOUS | Status: AC
  Filled 2020-01-07: qty 10

## 2020-01-07 MED ORDER — ORAL CARE MOUTH RINSE: 15.0000 mL | Freq: Once | OROMUCOSAL | Status: AC

## 2020-01-07 MED ORDER — ACETAMINOPHEN 10 MG/ML IV SOLN: 1000.0000 mg | Freq: Once | INTRAVENOUS | Status: DC | PRN

## 2020-01-07 MED ORDER — ONDANSETRON HCL 4 MG/2ML IJ SOLN
INTRAMUSCULAR | Status: DC | PRN
  Administered 2020-01-07: 4 mg via INTRAVENOUS

## 2020-01-07 MED ORDER — OXYCODONE HCL 5 MG PO TABS
ORAL_TABLET | ORAL | Status: AC
  Administered 2020-01-07: 5 mg via ORAL
  Filled 2020-01-07: qty 1

## 2020-01-07 MED ORDER — DEXAMETHASONE SODIUM PHOSPHATE 4 MG/ML IJ SOLN
INTRAMUSCULAR | Status: DC | PRN
  Administered 2020-01-07: 8 mg via INTRAVENOUS

## 2020-01-07 MED ORDER — OXYCODONE-ACETAMINOPHEN 5-325 MG PO TABS: 1.0000 | ORAL_TABLET | ORAL | 0 refills | Status: DC | PRN

## 2020-01-07 MED ORDER — 0.9 % SODIUM CHLORIDE (POUR BTL) OPTIME
TOPICAL | Status: DC | PRN
  Administered 2020-01-07: 1000 mL

## 2020-01-07 MED ORDER — DEXAMETHASONE SODIUM PHOSPHATE 10 MG/ML IJ SOLN
INTRAMUSCULAR | Status: AC
  Filled 2020-01-07: qty 1

## 2020-01-07 MED ORDER — MIDAZOLAM HCL 2 MG/2ML IJ SOLN
INTRAMUSCULAR | Status: AC
  Filled 2020-01-07: qty 2

## 2020-01-07 MED ORDER — GABAPENTIN 300 MG PO CAPS
ORAL_CAPSULE | ORAL | Status: AC
  Filled 2020-01-07: qty 1

## 2020-01-07 MED ORDER — ACETAMINOPHEN 500 MG PO TABS: 1000.0000 mg | ORAL_TABLET | ORAL | Status: AC

## 2020-01-07 MED ORDER — CHLORHEXIDINE GLUCONATE CLOTH 2 % EX PADS: 6.0000 | MEDICATED_PAD | Freq: Once | CUTANEOUS | Status: DC

## 2020-01-07 MED ORDER — ONDANSETRON HCL 4 MG/2ML IJ SOLN: 4.0000 mg | Freq: Once | INTRAMUSCULAR | Status: DC | PRN

## 2020-01-07 MED ORDER — KETAMINE HCL 10 MG/ML IJ SOLN
INTRAMUSCULAR | Status: DC | PRN
  Administered 2020-01-07: 30 mg via INTRAVENOUS

## 2020-01-07 MED ORDER — ACETAMINOPHEN 325 MG PO TABS: 325.0000 mg | ORAL_TABLET | ORAL | Status: DC | PRN

## 2020-01-07 MED ORDER — OXYCODONE HCL 5 MG/5ML PO SOLN: 5.0000 mg | Freq: Once | ORAL | Status: AC | PRN

## 2020-01-07 MED ORDER — CEFAZOLIN SODIUM-DEXTROSE 2-4 GM/100ML-% IV SOLN
INTRAVENOUS | Status: AC
  Filled 2020-01-07: qty 100

## 2020-01-07 MED ORDER — LIDOCAINE HCL (CARDIAC) PF 100 MG/5ML IV SOSY
PREFILLED_SYRINGE | INTRAVENOUS | Status: DC | PRN
  Administered 2020-01-07: 100 mg via INTRAVENOUS

## 2020-01-07 MED ORDER — ACETAMINOPHEN 500 MG PO TABS
ORAL_TABLET | ORAL | Status: AC
  Administered 2020-01-07: 1000 mg via ORAL
  Filled 2020-01-07: qty 2

## 2020-01-07 MED ORDER — CEFAZOLIN SODIUM-DEXTROSE 2-4 GM/100ML-% IV SOLN
2.0000 g | INTRAVENOUS | Status: AC
  Administered 2020-01-07: 2 g via INTRAVENOUS

## 2020-01-07 MED ORDER — OXYCODONE HCL 5 MG PO TABS
5.0000 mg | ORAL_TABLET | Freq: Once | ORAL | Status: AC | PRN
Start: 2020-01-07 — End: 2020-01-07

## 2020-01-07 MED ORDER — DEXMEDETOMIDINE (PRECEDEX) IN NS 20 MCG/5ML (4 MCG/ML) IV SYRINGE
PREFILLED_SYRINGE | INTRAVENOUS | Status: DC | PRN
  Administered 2020-01-07: 20 ug via INTRAVENOUS

## 2020-01-07 MED ORDER — FENTANYL CITRATE (PF) 100 MCG/2ML IJ SOLN
25.0000 ug | INTRAMUSCULAR | Status: DC | PRN
  Administered 2020-01-07: 50 ug via INTRAVENOUS

## 2020-01-07 MED ORDER — ROCURONIUM BROMIDE 100 MG/10ML IV SOLN
INTRAVENOUS | Status: DC | PRN
  Administered 2020-01-07: 60 mg via INTRAVENOUS

## 2020-01-07 MED ORDER — FENTANYL CITRATE (PF) 100 MCG/2ML IJ SOLN
INTRAMUSCULAR | Status: AC
  Filled 2020-01-07: qty 2

## 2020-01-07 MED ORDER — BUPIVACAINE-EPINEPHRINE (PF) 0.25% -1:200000 IJ SOLN
INTRAMUSCULAR | Status: AC
  Filled 2020-01-07: qty 30

## 2020-01-07 MED ORDER — CHLORHEXIDINE GLUCONATE 0.12 % MT SOLN
15.0000 mL | Freq: Once | OROMUCOSAL | Status: AC
  Administered 2020-01-07: 15 mL via OROMUCOSAL

## 2020-01-07 MED ORDER — ENOXAPARIN SODIUM 40 MG/0.4ML ~~LOC~~ SOLN
40.0000 mg | Freq: Once | SUBCUTANEOUS | Status: AC
  Administered 2020-01-07: 40 mg via SUBCUTANEOUS
  Filled 2020-01-07: qty 0.4

## 2020-01-07 MED ORDER — PROPOFOL 10 MG/ML IV BOLUS
INTRAVENOUS | Status: AC
  Filled 2020-01-07: qty 20

## 2020-01-07 MED ORDER — BUPIVACAINE-EPINEPHRINE 0.25% -1:200000 IJ SOLN
INTRAMUSCULAR | Status: DC | PRN
  Administered 2020-01-07: 30 mL

## 2020-01-07 MED ORDER — ONDANSETRON HCL 4 MG/2ML IJ SOLN
INTRAMUSCULAR | Status: AC
  Filled 2020-01-07: qty 2

## 2020-01-07 MED ORDER — GABAPENTIN 300 MG PO CAPS
300.0000 mg | ORAL_CAPSULE | ORAL | Status: AC
  Administered 2020-01-07: 300 mg via ORAL

## 2020-01-07 MED ORDER — MEPERIDINE HCL 50 MG/ML IJ SOLN
6.2500 mg | INTRAMUSCULAR | Status: DC | PRN
Start: 2020-01-07 — End: 2020-01-07

## 2020-01-07 MED ORDER — MIDAZOLAM HCL 5 MG/5ML IJ SOLN
INTRAMUSCULAR | Status: DC | PRN
  Administered 2020-01-07: 2 mg via INTRAVENOUS

## 2020-01-07 MED ORDER — LIDOCAINE HCL (PF) 2 % IJ SOLN
INTRAMUSCULAR | Status: AC
  Filled 2020-01-07: qty 5

## 2020-01-07 MED ORDER — LACTATED RINGERS IV SOLN: INTRAVENOUS | Status: DC

## 2020-01-07 MED ORDER — ACETAMINOPHEN 160 MG/5ML PO SOLN: 325.0000 mg | ORAL | Status: DC | PRN

## 2020-01-07 MED ORDER — LACTATED RINGERS IR SOLN
Status: DC | PRN
  Administered 2020-01-07: 1000 mL

## 2020-01-07 MED ORDER — SUGAMMADEX SODIUM 200 MG/2ML IV SOLN
INTRAVENOUS | Status: DC | PRN
  Administered 2020-01-07: 200 mg via INTRAVENOUS

## 2020-01-07 MED ORDER — PROPOFOL 10 MG/ML IV BOLUS
INTRAVENOUS | Status: DC | PRN
  Administered 2020-01-07: 150 mg via INTRAVENOUS

## 2020-01-07 SURGICAL SUPPLY — 37 items
ADH SKN CLS APL DERMABOND .7 (GAUZE/BANDAGES/DRESSINGS) ×1
APL PRP STRL LF DISP 70% ISPRP (MISCELLANEOUS) ×1
APPLIER CLIP ROT 10 11.4 M/L (STAPLE) ×2
APR CLP MED LRG 11.4X10 (STAPLE) ×1
BAG SPEC RTRVL LRG 6X4 10 (ENDOMECHANICALS) ×1
CABLE HIGH FREQUENCY MONO STRZ (ELECTRODE) ×2 IMPLANT
CHLORAPREP W/TINT 26 (MISCELLANEOUS) ×2 IMPLANT
CLIP APPLIE ROT 10 11.4 M/L (STAPLE) ×1 IMPLANT
COVER MAYO STAND STRL (DRAPES) IMPLANT
COVER SURGICAL LIGHT HANDLE (MISCELLANEOUS) ×2 IMPLANT
COVER WAND RF STERILE (DRAPES) IMPLANT
DECANTER SPIKE VIAL GLASS SM (MISCELLANEOUS) ×2 IMPLANT
DERMABOND ADVANCED (GAUZE/BANDAGES/DRESSINGS) ×1
DERMABOND ADVANCED .7 DNX12 (GAUZE/BANDAGES/DRESSINGS) ×1 IMPLANT
DRAPE C-ARM 42X120 X-RAY (DRAPES) IMPLANT
ELECT REM PT RETURN 15FT ADLT (MISCELLANEOUS) ×2 IMPLANT
GOWN STRL REUS W/TWL LRG LVL3 (GOWN DISPOSABLE) ×2 IMPLANT
GOWN STRL REUS W/TWL XL LVL3 (GOWN DISPOSABLE) ×4 IMPLANT
GRASPER SUT TROCAR 14GX15 (MISCELLANEOUS) ×2 IMPLANT
HEMOSTAT SNOW SURGICEL 2X4 (HEMOSTASIS) IMPLANT
KIT BASIN OR (CUSTOM PROCEDURE TRAY) ×2 IMPLANT
KIT TURNOVER KIT A (KITS) ×1 IMPLANT
NDL INSUFFLATION 14GA 120MM (NEEDLE) ×1 IMPLANT
NEEDLE INSUFFLATION 14GA 120MM (NEEDLE) ×2 IMPLANT
PENCIL SMOKE EVACUATOR (MISCELLANEOUS) IMPLANT
POUCH SPECIMEN RETRIEVAL 10MM (ENDOMECHANICALS) ×2 IMPLANT
SCISSORS LAP 5X35 DISP (ENDOMECHANICALS) ×2 IMPLANT
SET CHOLANGIOGRAPH MIX (MISCELLANEOUS) IMPLANT
SET IRRIG TUBING LAPAROSCOPIC (IRRIGATION / IRRIGATOR) ×2 IMPLANT
SET TUBE SMOKE EVAC HIGH FLOW (TUBING) ×2 IMPLANT
SLEEVE XCEL OPT CAN 5 100 (ENDOMECHANICALS) ×4 IMPLANT
SUT MNCRL AB 4-0 PS2 18 (SUTURE) ×2 IMPLANT
TOWEL OR 17X26 10 PK STRL BLUE (TOWEL DISPOSABLE) ×2 IMPLANT
TOWEL OR NON WOVEN STRL DISP B (DISPOSABLE) IMPLANT
TRAY LAPAROSCOPIC (CUSTOM PROCEDURE TRAY) ×2 IMPLANT
TROCAR BLADELESS OPT 5 100 (ENDOMECHANICALS) ×2 IMPLANT
TROCAR XCEL 12X100 BLDLESS (ENDOMECHANICALS) ×2 IMPLANT

## 2020-01-07 NOTE — Op Note (Signed)
Patient: Deanna Hughes (01-Jan-1991, [id number])  Date of Surgery: 01/07/2020   Preoperative Diagnosis: CHOLECYSTITIS   Postoperative Diagnosis: CHOLECYSTITIS   Surgical Procedure: LAPAROSCOPIC CHOLECYSTECTOMY:    Operative Team Members:    * Danuta Huseman, Nickola Major, MD - Primary   Anesthesiologist: Lyn Hollingshead, MD CRNA: Rosaland Lao, CRNA   Anesthesia: General   Fluids:  Total I/O In: 700 [I.V.:700] Out: 25 0000000  Complications: None  Drains:  none   Specimen:  ID Type Source Tests Collected by Time Destination  1 : gallbladder Tissue PATH Gallbladder SURGICAL PATHOLOGY Thompson Mckim, Nickola Major, MD 01/07/2020 1522      Disposition:  PACU - hemodynamically stable.  Plan of Care: Discharge to home after PACU    Indications for Procedure: Deanna Hughes is a 29 y.o. female who presented with gallstones.  History, physical and imaging was concerning for calculous cholecystitis.  Laparoscopic cholecystectomy was recommended for the patient.  The procedure itself, as well as the risks, benefits and alternatives were discussed with the patient.  Risks discussed included but were not limited to the risk of infection, bleeding, damage to nearby structures, need to convert to open procedure, incisional hernia, bile leak, common bile duct injury and the need for additional procedures or surgeries.  With this discussion complete and all questions answered the patient granted consent to proceed.  Findings: Gallstones  Description of Procedure:   On the date stated above, the patient was taken to the operating room suite and placed in supine positioning.  Sequential compression devices were placed on the lower extremities to prevent blood clots.  General endotracheal anesthesia was induced. Preoperative antibiotics (cefazolin) were given within 30 minutes of incision.  The patient's abdomen was prepped and draped in the usual sterile fashion.  A time-out was completed  verifying the correct patient, procedure, positioning and equipment needed for the case.  We began by anesthetizing the skin with local anesthetic and then making a 5 mm incision just below the umbilicus.  We dissected through the subcutaneous tissues to the fascia.  The fascia was grasped and elevated using a Kocher clamp.  A Veress needle was inserted into the abdomen and the abdomen was insufflated to 15 mmHg.  A 5 mm trocar was inserted in this position under optical guidance and then the abdomen was inspected.  There was no trauma to the underlying viscera with initial trocar placement.  Any abnormal findings, other than inflammation in the right upper quadrant, are listed above in the findings section.  Three additional trocars were placed, one 12 mm trocar in the subxiphoid position, one 5 mm trocar in the midline epigastric area and one 89mm trocar in the right upper quadrant subcostally.  These were placed under direct vision without any trauma to the underlying viscera.    The patient was then placed in head up, left side down positioning.  The gallbladder was identified and dissected free from its attachments to the omentum allowing the duodenum to fall away.  The infundibulum of the gallbladder was dissected free working laterally to medially.  The cystic duct and cystic artery were dissected free from surrounding connective tissue.  The infundibulum of the gallbladder was dissected off the cystic plate.  A critical view of safety was obtained with the cystic duct and cystic artery being cleared of connective tissues and clearly the only two structures entering into the gallbladder with the liver clearly visible behind.  Clips were then applied to the cystic duct and cystic  artery and then these structures were divided.  The gallbladder was dissected off the cystic plate, placed in an endocatch bag and removed from the 12 mm subxiphoid port site.  The clips were inspected and appeared effective.  The  cystic plate was inspected and hemostasis was obtained using electrocautery.  The operative field was dry on final inspection.  Attention was turned to closure.  The 12 mm subxiphoid port site was closed using a 0-vicryl suture on a fascial suture passer.  The abdomen was desufflated.  The skin was closed using 4-0 monocryl and dermabond.  All sponge and needle counts were correct at the conclusion of the case.    Louanna Raw, MD General, Bariatric, & Minimally Invasive Surgery [facility], [place]

## 2020-01-07 NOTE — H&P (Signed)
Admitting Physician: Nickola Major Alejandrina Raimer  Service: General Surgery  CC: Abdominal pain  Subjective   HPI: Deanna Hughes is an 29 y.o. female who is here for elective laparoscopic cholecystectomy.  Past Medical History:  Diagnosis Date  . Anxiety   . BV (bacterial vaginosis) 04/02/2015  . Chlamydia    treated 6/6  . Chronic back pain   . Chronic headaches    MIGRAINES  . Contraceptive management 01/25/2013  . Depression    Not on meds currently  . Encounter for Nexplanon removal 02/27/2015  . Irregular bleeding 06/03/2013  . Mid back pain 05/17/2018  . Miscarriage 03/27/2013  . Neck pain 05/17/2018  . Nexplanon insertion 04/11/2013   nexplanon inserted 04/11/13 remove 3/32/18  . Right inguinal hernia 12/14/2015  . Right sided abdominal pain   . Scoliosis   . Seasonal allergies 07/03/2012  . Sinus infection 08/27/2013  . Supervision of normal pregnancy 08/27/2015    [facility] Initiated Care at   7+5 weeks  FOB  Dating By  LMP  Pap  GC/CT Initial:                36+wks: Genetic Screen NT/IT:  CF screen  Anatomic [place]  Flu vaccine  Tdap Recommended ~ 28wks Glucose Screen  2 hr GBS  Feed Preference  Contraception  Circumcision  Childbirth Classes  Pediatrician    . Threatened abortion in early pregnancy 03/20/2013  . Vaginal discharge 10/03/2012  . Yeast infection 07/03/2012    Past Surgical History:  Procedure Laterality Date  . BIOPSY  09/18/2018   Procedure: BIOPSY;  Surgeon: Danie Binder, MD;  [facility];  Service: Endoscopy;;  . COLONOSCOPY WITH PROPOFOL N/A 09/18/2018   Procedure: COLONOSCOPY WITH PROPOFOL;  Surgeon: Danie Binder, MD;  Location: [facility];  Service: Endoscopy;  Laterality: N/A;  8:30am  . FLEXIBLE SIGMOIDOSCOPY N/A 10/05/2018   Procedure: FLEXIBLE SIGMOIDOSCOPY;  Surgeon: Danie Binder, MD;  Location: [facility];  Service: Endoscopy;  Laterality: N/A;  8:30  . HEMORRHOID BANDING N/A 10/05/2018   Procedure: Thayer Jew;   Surgeon: Danie Binder, MD;  [facility]: [facility];  Service: Endoscopy;  Laterality: N/A;  . HERNIA REPAIR    . INGUINAL HERNIA REPAIR Right 04/19/2017   Procedure: HERNIA REPAIR INGUINAL ADULT;  Surgeon: Robert Bellow, MD;  Location: [facility];  Service: General;  Laterality: Right;  . WISDOM TOOTH EXTRACTION      Family History  Problem Relation Age of Onset  . Asthma Mother   . Asthma Sister   . Asthma Brother   . Asthma Daughter   . Diabetes Maternal Grandmother   . Hypertension Maternal Grandmother   . Asthma Maternal Grandmother   . Heart disease Maternal Grandmother   . Stroke Maternal Grandmother   . Hypertension Maternal Grandfather   . Asthma Maternal Grandfather   . Heart disease Maternal Grandfather   . Asthma Son   . Colon cancer Neg Hx   . Colon polyps Neg Hx     Social:  reports that she has never smoked. She has never used smokeless tobacco. She reports that she does not drink alcohol and does not use drugs.  Allergies:  Allergies  Allergen Reactions  . Aspirin Shortness Of Breath and Other (See Comments)    Heart beats fast  . Advil [Ibuprofen] Other (See Comments)    Breakout, heart palpitations and trouble breathing.   . Latex Rash  Medications: Current Outpatient Medications  Medication Instructions  . amitriptyline (ELAVIL) 10 mg, Oral, Daily at bedtime  . medroxyPROGESTERone (DEPO-PROVERA) 150 mg, Intramuscular, Every 3 months  . megestrol (MEGACE) 40 MG tablet 3x5d, 2x5d, then 1 daily to help control vaginal bleeding. Stop taking when bleeding stops.    ROS - all of the below systems have been reviewed with the patient and positives are indicated with bold text General: chills, fever or night sweats Eyes: blurry vision or double vision ENT: epistaxis or sore throat Allergy/Immunology: itchy/watery eyes or nasal congestion Hematologic/Lymphatic: bleeding problems, blood clots or swollen lymph nodes Endocrine: temperature intolerance  or unexpected weight changes Breast: new or changing breast lumps or nipple discharge Resp: cough, shortness of breath, or wheezing CV: chest pain or dyspnea on exertion GI: as per HPI GU: dysuria, trouble voiding, or hematuria MSK: joint pain or joint stiffness Neuro: TIA or stroke symptoms Derm: pruritus and skin lesion changes Psych: anxiety and depression  Objective   PE Blood pressure 129/60, pulse (!) 101, temperature 98.3 F (36.8 C), temperature source Oral, resp. rate 18, height 5\' 2"  (1.575 m), weight 81.1 kg, SpO2 99 %, not currently breastfeeding. Constitutional: NAD; conversant; no deformities Eyes: Moist conjunctiva; no lid lag; anicteric; PERRL Neck: Trachea midline; no thyromegaly Lungs: Normal respiratory effort; no tactile fremitus CV: RRR; no palpable thrills; no pitting edema GI: Abd Mild RUQ tenderness; no palpable hepatosplenomegaly MSK: Normal range of motion of extremities; no clubbing/cyanosis Psychiatric: Appropriate affect; alert and oriented x3 Lymphatic: No palpable cervical or axillary lymphadenopathy  Results for orders placed or performed during the hospital encounter of 01/07/20 (from the past 24 hour(s))  Pregnancy, urine per protocol     Status: None   Collection Time: 01/07/20  1:27 PM  Result Value Ref Range   Preg Test, Ur NEGATIVE NEGATIVE    Imaging Orders  No imaging studies ordered today     Assessment and Plan   Deanna Hughes is an 30 y.o. female with right upper quadrant pain due to cholecystitis.  Recently seen at [facility] and found to have 12 mm gallstone.  She was seen in the office and recommended to undergo laparoscopic cholecystectomy.  The procedure itself as well as its risk, benefits, and alternatives were discussed with the patient.  The risk discussed included but were not limited to the risk of infection, bleeding, damage nearby structures, bile duct injury, and incisional hernia.  With this discussion complete  and all questions answered the patient granted consent to proceed.  We will proceed to the operating room as scheduled today.  Louanna Raw, M.D. General, Bariatric and Minimally Invasive Surgery  [facility] Use [email or web address] to contact on call provider

## 2020-01-07 NOTE — Anesthesia Postprocedure Evaluation (Signed)
Anesthesia Post Note  Patient: Deanna Hughes  Procedure(s) Performed: LAPAROSCOPIC CHOLECYSTECTOMY (N/A Abdomen)     Patient location during evaluation: PACU Anesthesia Type: General Level of consciousness: awake and sedated Pain management: pain level controlled Vital Signs Assessment: post-procedure vital signs reviewed and stable Respiratory status: spontaneous breathing Cardiovascular status: stable Postop Assessment: no apparent nausea or vomiting Anesthetic complications: no   No complications documented.  Last Vitals:  Vitals:   01/07/20 1700 01/07/20 1715  BP: 116/71 114/69  Pulse: 86 83  Resp: 20 18  Temp:  37.2 C  SpO2: 95% 96%    Last Pain:  Vitals:   01/07/20 1645  TempSrc:   PainSc: Randall Jr

## 2020-01-07 NOTE — Transfer of Care (Signed)
Immediate Anesthesia Transfer of Care Note  Patient: Deanna Hughes  Procedure(s) Performed: LAPAROSCOPIC CHOLECYSTECTOMY (N/A Abdomen)  Patient Location: PACU  Anesthesia Type:General  Level of Consciousness: awake, alert  and oriented  Airway & Oxygen Therapy: Patient Spontanous Breathing and Patient connected to face mask  Post-op Assessment: Report given to RN and Post -op Vital signs reviewed and stable  Post vital signs: Reviewed and stable  Last Vitals:  Vitals Value Taken Time  BP 140/69 01/07/20 1550  Temp    Pulse 109 01/07/20 1551  Resp 22 01/07/20 1551  SpO2 100 % 01/07/20 1551  Vitals shown include unvalidated device data.  Last Pain:  Vitals:   01/07/20 1329  TempSrc:   PainSc: 3       Patients Stated Pain Goal: 4 (86/28/24 1753)  Complications: No complications documented.

## 2020-01-07 NOTE — Discharge Instructions (Signed)
CHOLECYSTECTOMY POST OPERATIVE INSTRUCTIONS  Thinking Clearly  . The anesthesia may cause you to feel different for 1 or 2 days. Do not drive, drink alcohol, or make any big decisions for at least 2 days.  Nutrition . When you wake up, you will be able to drink small amounts of liquid. If you do not feel sick, you can slowly advance your diet to regular foods. . Continue to drink lots of fluids, usually about 8 to 10 glasses per day. . Eat a high-fiber diet so you don't strain during bowel movements. . High-Fiber Foods o Foods high in fiber include beans, bran cereals and whole-grain breads, peas, dried fruit (figs, apricots, and dates), raspberries, blackberries, strawberries, sweet corn, broccoli, baked potatoes with skin, plums, pears, apples, greens, and nuts. Activity . Slowly increase your activity. Be sure to get up and walk every hour or so to prevent blood clots. . No heavy lifting or strenuous activity for 4 weeks following surgery to prevent hernias at your incision sites . It is normal to feel tired. You may need more sleep than usual.  Get your rest but make sure to get up and move around frequently to prevent blood clots and pneumonia.  Work and Return to [employer] . You can go back to work when you feel well enough. Discuss the timing with your surgeon. . You can usually go back to school or work 1 week after an operation. . If your work requires heavy lifting or strenuous activity you need to be placed on light duty for 4 weeks following surgery. . You can return to gym class, sports or other physical activities 4 weeks after surgery.  Wound Care . Always wash your hands before and after touching near your incision site. . Do not soak in a bathtub until cleared at your follow up appointment. You may take a shower 24 hours after surgery. . A small amount of drainage from the incision is normal. If the drainage is thick and yellow or the site is red, you may have an infection,  so call your surgeon. . If you have a drain in one of your incisions, it will be taken out in office when the drainage stops. . Steri-Strips will fall off in 7 to 10 days or they will be removed during your first office visit. . If you have dermabond glue covering over the incision, allow the glue to flake off on its own. Marland Kitchen Avoid wearing tight or rough clothing. It may rub your incisions and make it harder for them to heal. . Protect the new skin, especially from the sun. The sun can burn and cause darker scarring. . Your scar will heal in about 4 to 6 weeks and will become softer and continue to fade over the next year.  The cosmetic appearance of the incisions will improve over the course of the first year after surgery. . Sensation around your incision will return in a few weeks or months.  Bowel Movements . After intestinal surgery, you may have loose watery stools for several days. If watery diarrhea lasts longer than 3 days, contact your surgeon. . Pain medication (narcotics) can cause constipation. Increase the fiber in your diet with high-fiber foods if you are constipated. You can take an over the counter stool softener like Colace to avoid constipation.  Additional over the counter medications can also be used if Colace isn't sufficient (for example, Milk of Magnesia or Miralax).  Pain . The amount of pain  is different for each person. Some people need only 1 to 3 doses of pain control medication, while others need more. . Take alternating doses of tylenol and ibuprofen around the clock for the first five days following surgery.  This will provide a baseline of pain control and help with inflammation.  Take the narcotic pain medication in addition if needed for severe pain.  Contact Your Surgeon at [phone], if you have: . Pain in your right upper abdomen like a gallbladder attack. . Pain that will not go away . Pain that gets worse . A fever of more than 101F (38.3C) . Repeated  vomiting . Swelling, redness, bleeding, or bad-smelling drainage from your wound site . Strong abdominal pain . No bowel movement or unable to pass gas for 3 days . Watery diarrhea lasting longer than 3 days  Pain Control . The goal of pain control is to minimize pain, keep you moving and help you heal. Your surgical team will work with you on your pain plan. Most often a combination of therapies and medications are used to control your pain. You may also be given medication (local anesthetic) at the surgical site. This may help control your pain for several days. . Extreme pain puts extra stress on your body at a time when your body needs to focus on healing. Do not wait until your pain has reached a level "10" or is unbearable before telling your doctor or nurse. It is much easier to control pain before it becomes severe. . Following a laparoscopic procedure, pain is sometimes felt in the shoulder. This is due to the gas inserted into your abdomen during the procedure. Moving and walking helps to decrease the gas and the right shoulder pain.  . Use the guide below for ways to manage your post-operative pain. Learn more by going to facs.org/safepaincontrol.  How Intense Is My Pain Common Therapies to Feel Better       I hardly notice my pain, and it does not interfere with my activities.  I notice my pain and it distracts me, but I can still do activities (sitting up, walking, standing).  Non-Medication Therapies  Ice (in a bag, applied over clothing at the surgical site), elevation, rest, meditation, massage, distraction (music, TV, play) walking and mild exercise Splinting the abdomen with pillows +  Non-Opioid Medications Acetaminophen (Tylenol) Non-steroidal anti-inflammatory drugs (NSAIDS) Aspirin, Ibuprofen (Motrin, Advil) Naproxen (Aleve) Take these as needed, when you feel pain. Both acetaminophen and NSAIDs help to decrease pain and swelling (inflammation).      My  pain is hard to ignore and is more noticeable even when I rest.  My pain interferes with my usual activities.  Non-Medication Therapies  +  Non-Opioid medications  Take on a regular schedule (around-the-clock) instead of as needed. (For example, Tylenol every 6 hours at 9:00 am, 3:00 pm, 9:00 pm, 3:00 am and Motrin every 6 hours at 12:00 am, 6:00 am, 12:00 pm, 6:00 pm)         I am focused on my pain, and I am not doing my daily activities.  I am groaning in pain, and I cannot sleep. I am unable to do anything.  My pain is as bad as it could be, and nothing else matters.  Non-Medication Therapies  +  Around-the-Clock Non-Opioid Medications  +  Short-acting opioids  Opioids should be used with other medications to manage severe pain. Opioids block pain and give a feeling of euphoria (feel high). Addiction,  a serious side effect of opioids, is rare with short-term (a few days) use. ° °Examples of short-acting opioids include: Tramadol (Ultram®), Hydrocodone (Norco®, Vicodin®), Hydromorphone (Dilaudid®), Oxycodone (Oxycontin®) °  ° ° °The above directions have been adapted from the American College of [employer] Surgical Patient Education Program.  Please refer to the ACS website if needed: https://www.facs.org/-/media/files/education/patient-ed/cholesys.ashx. ° ° °Deanna Fredricksen, MD °[facility] °[address], [address], [place], [place]  [address] ? ° [address] [address], [place], [place]   [address] °[phone] ? [phone] ? FAX [phone] °Web site: [facility] ° °

## 2020-01-07 NOTE — Anesthesia Preprocedure Evaluation (Signed)
Anesthesia Evaluation  Patient identified by MRN, date of birth, ID band Patient awake    Reviewed: Allergy & Precautions, NPO status , Patient's Chart, lab work & pertinent test results  Airway Mallampati: I       Dental no notable dental hx.    Pulmonary neg pulmonary ROS,    Pulmonary exam normal        Cardiovascular negative cardio ROS Normal cardiovascular exam     Neuro/Psych  Headaches, PSYCHIATRIC DISORDERS Anxiety Depression    GI/Hepatic negative GI ROS, Neg liver ROS,   Endo/Other  negative endocrine ROS  Renal/GU negative Renal ROS  negative genitourinary   Musculoskeletal negative musculoskeletal ROS (+)   Abdominal (+) + obese,   Peds  Hematology negative hematology ROS (+)   Anesthesia Other Findings   Reproductive/Obstetrics negative OB ROS                             Anesthesia Physical Anesthesia Plan  ASA: II  Anesthesia Plan: General   Post-op Pain Management:    Induction: Intravenous  PONV Risk Score and Plan: 4 or greater and Ondansetron, Dexamethasone and Midazolam  Airway Management Planned: Oral ETT  Additional Equipment: None  Intra-op Plan:   Post-operative Plan: Extubation in OR  Informed Consent: I have reviewed the patients History and Physical, chart, labs and discussed the procedure including the risks, benefits and alternatives for the proposed anesthesia with the patient or authorized representative who has indicated his/her understanding and acceptance.     Dental advisory given  Plan Discussed with: CRNA  Anesthesia Plan Comments:         Anesthesia Quick Evaluation

## 2020-01-07 NOTE — Anesthesia Procedure Notes (Signed)
Procedure Name: Intubation Performed by: Demetria Lightsey A, CRNA Pre-anesthesia Checklist: Patient identified, Emergency Drugs available, Suction available and Patient being monitored Patient Re-evaluated:Patient Re-evaluated prior to induction Oxygen Delivery Method: Circle system utilized Preoxygenation: Pre-oxygenation with 100% oxygen Induction Type: IV induction Ventilation: Mask ventilation without difficulty Laryngoscope Size: Miller and 2 Grade View: Grade I Tube type: Oral Tube size: 7.0 mm Number of attempts: 1 Airway Equipment and Method: Stylet Placement Confirmation: ETT inserted through vocal cords under direct vision,  positive ETCO2 and breath sounds checked- equal and bilateral Secured at: 22 cm Tube secured with: Tape Dental Injury: Teeth and Oropharynx as per pre-operative assessment        

## 2020-01-08 ENCOUNTER — Encounter (HOSPITAL_COMMUNITY): Payer: Self-pay | Admitting: Surgery

## 2020-01-09 LAB — SURGICAL PATHOLOGY

## 2020-01-31 ENCOUNTER — Ambulatory Visit (INDEPENDENT_AMBULATORY_CARE_PROVIDER_SITE_OTHER): Payer: Medicaid Other | Admitting: *Deleted

## 2020-01-31 ENCOUNTER — Other Ambulatory Visit: Payer: Self-pay

## 2020-01-31 DIAGNOSIS — Z308 Encounter for other contraceptive management: Secondary | ICD-10-CM

## 2020-01-31 MED ORDER — MEDROXYPROGESTERONE ACETATE 150 MG/ML IM SUSP
150.0000 mg | Freq: Once | INTRAMUSCULAR | Status: AC
  Administered 2020-01-31: 150 mg via INTRAMUSCULAR

## 2020-01-31 NOTE — Progress Notes (Signed)
   NURSE VISIT- INJECTION  SUBJECTIVE:  Deanna Hughes is a 30 y.o. [phone] female here for a Depo Provera for contraception/period management. She is a GYN patient.   OBJECTIVE:  There were no vitals taken for this visit.  Appears well, in no apparent distress  Injection administered in: Left upper quad. gluteus  Meds ordered this encounter  Medications  . medroxyPROGESTERone (DEPO-PROVERA) injection 150 mg    ASSESSMENT: GYN patient Depo Provera for contraception/period management PLAN: Follow-up: in 11-13 weeks for next Depo   Levy Pupa  01/31/2020 10:46 AM

## 2020-02-22 ENCOUNTER — Encounter: Payer: Self-pay | Admitting: Emergency Medicine

## 2020-02-22 ENCOUNTER — Other Ambulatory Visit: Payer: Self-pay

## 2020-02-22 ENCOUNTER — Ambulatory Visit
Admission: EM | Admit: 2020-02-22 | Discharge: 2020-02-22 | Disposition: A | Discharge status: Home / Self Care | Payer: Medicaid Other | Attending: Family Medicine | Admitting: Family Medicine

## 2020-02-22 ENCOUNTER — Ambulatory Visit (INDEPENDENT_AMBULATORY_CARE_PROVIDER_SITE_OTHER): Payer: Medicaid Other

## 2020-02-22 DIAGNOSIS — M79642 Pain in left hand: Secondary | ICD-10-CM | POA: Diagnosis not present

## 2020-02-22 MED ORDER — TRAMADOL HCL 50 MG PO TABS: 50.0000 mg | ORAL_TABLET | Freq: Four times a day (QID) | ORAL | 0 refills | Status: DC | PRN

## 2020-02-22 MED ORDER — PREDNISONE 10 MG (21) PO TBPK: ORAL_TABLET | Freq: Every day | ORAL | 0 refills | Status: AC

## 2020-02-22 NOTE — ED Triage Notes (Signed)
Left hand swollen no known injury.  Pain started a week ago and went away now her fingers are hurting and hand swollen, pain radiates up her arm.

## 2020-02-22 NOTE — Discharge Instructions (Addendum)
Xrays today negative.  I have sent in a prednisone taper for you to take for 6 days. 6 tablets on day one, 5 tablets on day two, 4 tablets on day three, 3 tablets on day four, 2 tablets on day five, and 1 tablet on day six.  I have sent in tramadol for you to take for breakthrough pain, 1 tablet every 6 hours as needed  If symptoms are not improving, follow up with orthopedics

## 2020-02-22 NOTE — ED Provider Notes (Signed)
[place]   [id number] 02/22/20 Arrival Time: 1233  HY:QMVHQ PAIN  SUBJECTIVE: History from: patient. Deanna Hughes is a 30 y.o. female complains of right hand pain that began about one week ago. Reports old boxer's fracture on the same hand. Denies a precipitating event or specific injury. Localizes the pain to the lateral 3 fingers to the L hand. Reports that the area is swollen and when she tries to move her fingers, a pain shoots up her arm to her elbow. Has taken tylenol with little relief. Symptoms are made worse with activity. Denies fever, chills, erythema, ecchymosis, effusion, weakness, numbness and tingling, saddle paresthesias, loss of bowel or bladder function.      ROS: As per HPI.  All other pertinent ROS negative.     Past Medical History:  Diagnosis Date  . Anxiety   . BV (bacterial vaginosis) 04/02/2015  . Chlamydia    treated 6/6  . Chronic back pain   . Chronic headaches    MIGRAINES  . Contraceptive management 01/25/2013  . Depression    Not on meds currently  . Encounter for Nexplanon removal 02/27/2015  . Irregular bleeding 06/03/2013  . Mid back pain 05/17/2018  . Miscarriage 03/27/2013  . Neck pain 05/17/2018  . Nexplanon insertion 04/11/2013   nexplanon inserted 04/11/13 remove 3/32/18  . Right inguinal hernia 12/14/2015  . Right sided abdominal pain   . Scoliosis   . Seasonal allergies 07/03/2012  . Sinus infection 08/27/2013  . Supervision of normal pregnancy 08/27/2015    [facility] at   7+5 weeks  FOB  Dating By  LMP  Pap  GC/CT Initial:                36+wks: Genetic Screen NT/IT:  CF screen  Anatomic [place]  Flu vaccine  Tdap Recommended ~ 28wks Glucose Screen  2 hr GBS  Feed Preference  Contraception  Circumcision  Childbirth Classes  Pediatrician    . Threatened abortion in early pregnancy 03/20/2013  . Vaginal discharge 10/03/2012  . Yeast infection 07/03/2012   Past Surgical History:  Procedure Laterality Date  . BIOPSY   09/18/2018   Procedure: BIOPSY;  Surgeon: Danie Binder, MD;  [facility];  Service: Endoscopy;;  . CHOLECYSTECTOMY N/A 01/07/2020   Procedure: LAPAROSCOPIC CHOLECYSTECTOMY;  Surgeon: Felicie Morn, MD;  Location: [facility];  Service: General;  Laterality: N/A;  . COLONOSCOPY WITH PROPOFOL N/A 09/18/2018   Procedure: COLONOSCOPY WITH PROPOFOL;  Surgeon: Danie Binder, MD;  [facility]: [facility];  Service: Endoscopy;  Laterality: N/A;  8:30am  . FLEXIBLE SIGMOIDOSCOPY N/A 10/05/2018   Procedure: FLEXIBLE SIGMOIDOSCOPY;  Surgeon: Danie Binder, MD;  [facility];  Service: Endoscopy;  Laterality: N/A;  8:30  . HEMORRHOID BANDING N/A 10/05/2018   Procedure: Thayer Jew;  Surgeon: Danie Binder, MD;  [facility];  Service: Endoscopy;  Laterality: N/A;  . HERNIA REPAIR    . INGUINAL HERNIA REPAIR Right 04/19/2017   Procedure: HERNIA REPAIR INGUINAL ADULT;  Surgeon: Robert Bellow, MD;  Location: [facility];  Service: General;  Laterality: Right;  . WISDOM TOOTH EXTRACTION     Allergies  Allergen Reactions  . Aspirin Shortness Of Breath and Other (See Comments)    Heart beats fast  . Advil [Ibuprofen] Other (See Comments)    Breakout, heart palpitations and trouble breathing.   . Latex Rash   No current facility-administered medications on  file prior to encounter.   Current Outpatient Medications on File Prior to Encounter  Medication Sig Dispense Refill  . acetaminophen (TYLENOL) 500 MG tablet Take 1 tablet (500 mg total) by mouth every 6 (six) hours as needed. 50 tablet 0  . medroxyPROGESTERone (DEPO-PROVERA) 150 MG/ML injection Inject 1 mL (150 mg total) into the muscle every 3 (three) months. 1 mL 3  . megestrol (MEGACE) 40 MG tablet 3x5d, 2x5d, then 1 daily to help control vaginal bleeding. Stop taking when bleeding stops. 45 tablet 1   Social History   Socioeconomic History  . Marital status: Single    Spouse name: Not on file  .  Number of children: 4  . Years of education: Not on file  . Highest education level: High school graduate  Occupational History    Comment: [employer], cafeteria  Tobacco Use  . Smoking status: Never Smoker  . Smokeless tobacco: Never Used  Vaping Use  . Vaping Use: Never used  Substance and Sexual Activity  . Alcohol use: No  . Drug use: No  . Sexual activity: Not Currently    Birth control/protection: Injection  Other Topics Concern  . Not on file  Social History Narrative   Lives with her 3 children      Zimiah (8)    Zatavious (6)      Zayla (1)- Daddy is present in life.       Social Determinants of Health   Financial Resource Strain: Not on file  Food Insecurity: Not on file  Transportation Needs: Not on file  Physical Activity: Not on file  Stress: Not on file  Social Connections: Not on file  Intimate Partner Violence: Not on file   Family History  Problem Relation Age of Onset  . Asthma Mother   . Asthma Sister   . Asthma Brother   . Asthma Daughter   . Diabetes Maternal Grandmother   . Hypertension Maternal Grandmother   . Asthma Maternal Grandmother   . Heart disease Maternal Grandmother   . Stroke Maternal Grandmother   . Hypertension Maternal Grandfather   . Asthma Maternal Grandfather   . Heart disease Maternal Grandfather   . Asthma Son   . Colon cancer Neg Hx   . Colon polyps Neg Hx     OBJECTIVE:  Vitals:   02/22/20 1243 02/22/20 1246  BP: 111/64   Pulse: 96   Resp: 18   Temp: 98.3 F (36.8 C)   TempSrc: Oral   SpO2: 98%   Weight:  177 lb (80.3 kg)  Height:  5\' 2"  (1.575 m)    General appearance: ALERT; in no acute distress.  Head: NCAT Lungs: Normal respiratory effort CV: pulses 2+ bilaterally. Cap refill < 2 seconds Musculoskeletal:  Inspection: Skin warm, dry, clear and intact No erythema, effusion to L hand Palpation: L lateral 3 fingers tender to palpation ROM: Limited ROM active and passive to L hand Skin: warm and  dry Neurologic: Ambulates without difficulty; Sensation intact about the upper/ lower extremities Psychological: alert and cooperative; normal mood and affect  DIAGNOSTIC STUDIES:  DG Hand Complete Left  Result Date: 02/22/2020 CLINICAL DATA:  Left hand pain.  No known injury. EXAM: LEFT HAND - COMPLETE 3+ VIEW COMPARISON:  None. FINDINGS: No acute fracture or dislocation. No aggressive osseous lesion. Normal alignment. Soft tissue are unremarkable. No radiopaque foreign body or soft tissue emphysema. IMPRESSION: No acute osseous injury of the left knee. Electronically Signed   By: Elbert Ewings  Patel   On: 02/22/2020 13:04     ASSESSMENT & PLAN:  1. Left hand pain     Meds ordered this encounter  Medications  . predniSONE (STERAPRED UNI-PAK 21 TAB) 10 MG (21) TBPK tablet    Sig: Take by mouth daily for 6 days. Take 6 tablets on day 1, 5 tablets on day 2, 4 tablets on day 3, 3 tablets on day 4, 2 tablets on day 5, 1 tablet on day 6    Dispense:  21 tablet    Refill:  0    Order Specific Question:   Supervising Provider    Answer:   Chase Picket [id number]  . traMADol (ULTRAM) 50 MG tablet    Sig: Take 1 tablet (50 mg total) by mouth every 6 (six) hours as needed.    Dispense:  15 tablet    Refill:  0    Order Specific Question:   Supervising Provider    Answer:   Chase Picket [[id number]]   Xrays negative today for fractures or misalignments Prescribed steroids Prescribed tramadol for breakthrough pain when tylenol is not covering pain May use ice to the area as well  Continue conservative management of rest, ice, and gentle stretches Take tylenol as needed for pain relief  Follow up with PCP if symptoms persist Return or go to the ER if you have any new or worsening symptoms (fever, chills, chest pain, abdominal pain, changes in bowel or bladder habits, pain radiating into lower legs)   [place] Controlled Substances Registry consulted for this patient. I feel the risk/benefit ratio  today is favorable for proceeding with this prescription for a controlled substance. Medication sedation precautions given.  Reviewed expectations re: course of current medical issues. Questions answered. Outlined signs and symptoms indicating need for more acute intervention. Patient verbalized understanding. After Visit Summary given.       Faustino Congress, NP 02/22/20 [phone]

## 2020-03-17 ENCOUNTER — Other Ambulatory Visit: Payer: Self-pay

## 2020-03-17 ENCOUNTER — Telehealth: Payer: Self-pay | Admitting: Adult Health

## 2020-03-17 NOTE — Telephone Encounter (Signed)
Patient states she thinks her "pH balance" is off and needs to be seen but also wants her hormones checked.  Says she has no desire for sexual intercourse and just wants to make sure she is fine.  Appt made to see provider for hormone check and nurse visit for swab.

## 2020-03-17 NOTE — Telephone Encounter (Signed)
Pt wants hormones checked & her pH balance checked What is best?  Nurse visit for swab & order blood work or see a provider?  Please advise

## 2020-03-19 ENCOUNTER — Other Ambulatory Visit: Payer: Medicaid Other

## 2020-03-24 ENCOUNTER — Other Ambulatory Visit: Payer: Self-pay

## 2020-03-24 ENCOUNTER — Other Ambulatory Visit (INDEPENDENT_AMBULATORY_CARE_PROVIDER_SITE_OTHER): Payer: Medicaid Other | Admitting: *Deleted

## 2020-03-24 ENCOUNTER — Other Ambulatory Visit (HOSPITAL_COMMUNITY)
Admission: RE | Admit: 2020-03-24 | Discharge: 2020-03-24 | Disposition: A | Discharge status: Home / Self Care | Payer: Medicaid Other | Source: Ambulatory Visit | Attending: Obstetrics & Gynecology | Admitting: Obstetrics & Gynecology

## 2020-03-24 DIAGNOSIS — N898 Other specified noninflammatory disorders of vagina: Secondary | ICD-10-CM

## 2020-03-24 NOTE — Progress Notes (Signed)
Chart reviewed for nurse visit. Agree with plan of care.  Estill Dooms, NP 03/24/2020 11:32 AM

## 2020-03-24 NOTE — Progress Notes (Signed)
   NURSE VISIT- VAGINITIS/STD/POC  SUBJECTIVE:  Deanna Hughes is a 30 y.o. [id number] GYN patientfemale here for a vaginal swab for vaginitis screening.  She reports the following symptoms: vaginal discharge and odor. for unknown days. Denies abnormal vaginal bleeding, significant pelvic pain, fever, or UTI symptoms.  OBJECTIVE:  There were no vitals taken for this visit.  Appears well, in no apparent distress  ASSESSMENT: Vaginal swab for vaginitis screening  PLAN: Self-collected vaginal probe for Gonorrhea, Chlamydia, Trichomonas, Bacterial Vaginosis, Yeast sent to lab Treatment: to be determined once results are received Follow-up as needed if symptoms persist/worsen, or new symptoms develop  Deanna Hughes  03/24/2020 9:50 AM

## 2020-03-25 ENCOUNTER — Other Ambulatory Visit: Payer: Self-pay | Admitting: Adult Health

## 2020-03-25 LAB — CERVICOVAGINAL ANCILLARY ONLY
Bacterial Vaginitis (gardnerella): POSITIVE — AB
Candida Glabrata: NEGATIVE
Candida Vaginitis: NEGATIVE
Chlamydia: NEGATIVE
Comment: NEGATIVE
Comment: NEGATIVE
Comment: NEGATIVE
Comment: NEGATIVE
Comment: NEGATIVE
Comment: NORMAL
Neisseria Gonorrhea: NEGATIVE
Trichomonas: NEGATIVE

## 2020-03-25 MED ORDER — METRONIDAZOLE 500 MG PO TABS
500.0000 mg | ORAL_TABLET | Freq: Two times a day (BID) | ORAL | 0 refills | Status: DC
Start: 2020-03-25 — End: 2020-05-20

## 2020-03-25 NOTE — Progress Notes (Signed)
+  BV on vaginal swab will rx flagyl 

## 2020-03-31 ENCOUNTER — Other Ambulatory Visit: Payer: Self-pay

## 2020-03-31 ENCOUNTER — Encounter: Payer: Self-pay | Admitting: Adult Health

## 2020-03-31 ENCOUNTER — Ambulatory Visit (INDEPENDENT_AMBULATORY_CARE_PROVIDER_SITE_OTHER): Payer: Medicaid Other | Admitting: Adult Health

## 2020-03-31 VITALS — BP 103/71 | HR 86 | Ht 62.0 in | Wt 188.5 lb

## 2020-03-31 DIAGNOSIS — R6882 Decreased libido: Secondary | ICD-10-CM

## 2020-03-31 DIAGNOSIS — Z3042 Encounter for surveillance of injectable contraceptive: Secondary | ICD-10-CM

## 2020-03-31 DIAGNOSIS — N926 Irregular menstruation, unspecified: Secondary | ICD-10-CM

## 2020-03-31 DIAGNOSIS — N760 Acute vaginitis: Secondary | ICD-10-CM | POA: Insufficient documentation

## 2020-03-31 DIAGNOSIS — B9689 Other specified bacterial agents as the cause of diseases classified elsewhere: Secondary | ICD-10-CM | POA: Diagnosis not present

## 2020-03-31 MED ORDER — MEGESTROL ACETATE 40 MG PO TABS: ORAL_TABLET | ORAL | 1 refills | Status: DC

## 2020-03-31 MED ORDER — BORIC ACID VAGINAL 600 MG VA SUPP: 1.0000 | VAGINAL | 2 refills | Status: DC

## 2020-03-31 NOTE — Progress Notes (Signed)
  Subjective:     Patient ID: Deanna Hughes, female   DOB: [birth year], 30 y.o.   MRN: [id number]  HPI [name] is a 30 year old black female,single, [id number], on depo, complaining of decreased libido and hot flashes and frequent BV or yeast infections, requests refill on boric acid supp. Has had bleeding with depo too. PCP is Cherly Beach NP  Review of Systems Patient denies any headaches, hearing loss, fatigue, blurred vision, shortness of breath, chest pain, abdominal pain, problems with bowel movements, urination, or intercourse. No joint pain or mood swings. See HPI for positives. Reviewed past medical,surgical, social and family history. Reviewed medications and allergies.     Objective:   Physical Exam BP 103/71 (BP Location: Left Arm, Patient Position: Sitting, Cuff Size: Normal)   Pulse 86   Ht 5\' 2"  (1.575 m)   Wt 188 lb 8 oz (85.5 kg)   LMP 03/14/2020 (Approximate)   Breastfeeding No   BMI 34.48 kg/m  Skin warm and dry. Lungs: clear to ausculation bilaterally. Cardiovascular: regular rate and rhythm.   Fall risk is low  Upstream - 03/31/20 1025      Pregnancy Intention Screening   Does the patient want to become pregnant in the next year? No    Does the patient's partner want to become pregnant in the next year? No    Would the patient like to discuss contraceptive options today? No      Contraception Wrap Up   Current Method Hormonal Injection    End Method Hormonal Injection    Contraception Counseling Provided No          Assessment:     1. Irregular bleeding Will refill megace  2. Depo-Provera contraceptive status   3. Decreased libido Try increasing frequency but explained that birth control can decrease libido   4. BV (bacterial vaginosis) Will refill boric acid supp Meds ordered this encounter  Medications  . megestrol (MEGACE) 40 MG tablet    Sig: 3x5d, 2x5d, then 1 daily to help control vaginal bleeding. Stop taking when bleeding stops.     Dispense:  45 tablet    Refill:  1    Order Specific Question:   Supervising Provider    Answer:   Elonda Husky, LUTHER H [[id number]]  . Boric Acid Vaginal 600 MG SUPP    Sig: Place 1 suppository vaginally 3 (three) times a week. Place supp in vagina 3 x weekly as needed    Dispense:  30 suppository    Refill:  2    Order Specific Question:   Supervising Provider    Answer:   Florian Buff [[id number]]         Plan:     Follow up in 6 months for pap and physical

## 2020-04-17 ENCOUNTER — Other Ambulatory Visit: Payer: Self-pay | Admitting: Adult Health

## 2020-04-17 ENCOUNTER — Ambulatory Visit: Payer: Medicaid Other

## 2020-04-17 MED ORDER — FLUCONAZOLE 150 MG PO TABS: ORAL_TABLET | ORAL | 1 refills | Status: DC

## 2020-04-17 NOTE — Progress Notes (Signed)
rx diflucan  

## 2020-04-20 ENCOUNTER — Ambulatory Visit (INDEPENDENT_AMBULATORY_CARE_PROVIDER_SITE_OTHER): Payer: Medicaid Other

## 2020-04-20 ENCOUNTER — Other Ambulatory Visit: Payer: Self-pay

## 2020-04-20 DIAGNOSIS — Z3042 Encounter for surveillance of injectable contraceptive: Secondary | ICD-10-CM

## 2020-04-20 MED ORDER — MEDROXYPROGESTERONE ACETATE 150 MG/ML IM SUSP
150.0000 mg | Freq: Once | INTRAMUSCULAR | Status: AC
  Administered 2020-04-20: 150 mg via INTRAMUSCULAR

## 2020-04-20 NOTE — Progress Notes (Signed)
   NURSE VISIT- INJECTION  SUBJECTIVE:  Deanna Hughes is a 30 y.o. [phone] female here for a Depo Provera for contraception/period management. She is a GYN patient.   OBJECTIVE:  There were no vitals taken for this visit.  Appears well, in no apparent distress  Injection administered in: Right upper quad. gluteus  Meds ordered this encounter  Medications  . medroxyPROGESTERone (DEPO-PROVERA) injection 150 mg    ASSESSMENT: GYN patient Depo Provera for contraception/period management PLAN: Follow-up: in 11-13 weeks for next Depo   Antoine Fiallos A Raylee Adamec  04/20/2020 2:50 PM

## 2020-05-20 ENCOUNTER — Other Ambulatory Visit: Payer: Self-pay

## 2020-05-20 ENCOUNTER — Ambulatory Visit
Admission: RE | Admit: 2020-05-20 | Discharge: 2020-05-20 | Disposition: A | Discharge status: Home / Self Care | Payer: Medicaid Other | Source: Ambulatory Visit | Attending: Emergency Medicine | Admitting: Emergency Medicine

## 2020-05-20 VITALS — BP 123/81 | HR 90 | Temp 98.6°F | Resp 17

## 2020-05-20 DIAGNOSIS — L750 Bromhidrosis: Secondary | ICD-10-CM | POA: Insufficient documentation

## 2020-05-20 DIAGNOSIS — N898 Other specified noninflammatory disorders of vagina: Secondary | ICD-10-CM | POA: Diagnosis not present

## 2020-05-20 LAB — POCT URINALYSIS DIP (MANUAL ENTRY)
Bilirubin, UA: NEGATIVE
Glucose, UA: NEGATIVE mg/dL
Ketones, POC UA: NEGATIVE mg/dL
Leukocytes, UA: NEGATIVE
Nitrite, UA: NEGATIVE
Protein Ur, POC: NEGATIVE mg/dL
Spec Grav, UA: 1.015 (ref 1.010–1.025)
Urobilinogen, UA: 0.2 E.U./dL
pH, UA: 7.5 (ref 5.0–8.0)

## 2020-05-20 LAB — POCT URINE PREGNANCY: Preg Test, Ur: NEGATIVE

## 2020-05-20 MED ORDER — FLUCONAZOLE 200 MG PO TABS: ORAL_TABLET | ORAL | 0 refills | Status: DC

## 2020-05-20 MED ORDER — METRONIDAZOLE 500 MG PO TABS: 500.0000 mg | ORAL_TABLET | Freq: Two times a day (BID) | ORAL | 0 refills | Status: DC

## 2020-05-20 NOTE — Discharge Instructions (Signed)
Will treat for possible bacterial vaginosis Cover for yeast infection secondary to antibiotic use Urine without signs of infection Urine culture sent.  We will call you with the results.   Push fluids and get plenty of rest.   Take antibiotic as directed and to completion Follow up with PCP for recheck Return here or go to ER if you have any new or worsening symptoms such as fever, worsening abdominal pain, nausea/vomiting, flank pain, etc..Marland Kitchen

## 2020-05-20 NOTE — ED Triage Notes (Signed)
Odor, frequency with urination x 2 weeks .

## 2020-05-20 NOTE — ED Provider Notes (Signed)
[facility]   CC: Odor  SUBJECTIVE:  Deanna Hughes is a 30 y.o. female who complains of vaginal odor, urinary frequency, and malodorous urine x 2 week.  Admits to decreased water intake.  Last sex >5 months ago.  Denies abdominal or flank pain.  Has NOT tried OTC medications.  Symptoms are made worse with urination.  Admits to similar symptoms in the past.  Denies fever, chills, nausea, vomiting, abdominal pain, flank pain, abnormal vaginal discharge or bleeding, hematuria.    LMP: No LMP recorded. Patient has had an injection.  ROS: As in HPI.  All other pertinent ROS negative.     Past Medical History:  Diagnosis Date  . Anxiety   . BV (bacterial vaginosis) 04/02/2015  . Chlamydia    treated 6/6  . Chronic back pain   . Chronic headaches    MIGRAINES  . Contraceptive management 01/25/2013  . Depression    Not on meds currently  . Encounter for Nexplanon removal 02/27/2015  . Irregular bleeding 06/03/2013  . Mid back pain 05/17/2018  . Miscarriage 03/27/2013  . Neck pain 05/17/2018  . Nexplanon insertion 04/11/2013   nexplanon inserted 04/11/13 remove 3/32/18  . Right inguinal hernia 12/14/2015  . Right sided abdominal pain   . Scoliosis   . Seasonal allergies 07/03/2012  . Sinus infection 08/27/2013  . Supervision of normal pregnancy 08/27/2015    [facility] at   7+5 weeks  FOB  Dating By  LMP  Pap  GC/CT Initial:                36+wks: Genetic Screen NT/IT:  CF screen  Anatomic [place]  Flu vaccine  Tdap Recommended ~ 28wks Glucose Screen  2 hr GBS  Feed Preference  Contraception  Circumcision  Childbirth Classes  Pediatrician    . Threatened abortion in early pregnancy 03/20/2013  . Vaginal discharge 10/03/2012  . Yeast infection 07/03/2012   Past Surgical History:  Procedure Laterality Date  . BIOPSY  09/18/2018   Procedure: BIOPSY;  Surgeon: Danie Binder, MD;  [facility];  Service: Endoscopy;;  . CHOLECYSTECTOMY N/A 01/07/2020    Procedure: LAPAROSCOPIC CHOLECYSTECTOMY;  Surgeon: Felicie Morn, MD;  Location: [facility];  Service: General;  Laterality: N/A;  . COLONOSCOPY WITH PROPOFOL N/A 09/18/2018   Procedure: COLONOSCOPY WITH PROPOFOL;  Surgeon: Danie Binder, MD;  [facility];  Service: Endoscopy;  Laterality: N/A;  8:30am  . FLEXIBLE SIGMOIDOSCOPY N/A 10/05/2018   Procedure: FLEXIBLE SIGMOIDOSCOPY;  Surgeon: Danie Binder, MD;  [facility];  Service: Endoscopy;  Laterality: N/A;  8:30  . HEMORRHOID BANDING N/A 10/05/2018   Procedure: Thayer Jew;  Surgeon: Danie Binder, MD;  [facility];  Service: Endoscopy;  Laterality: N/A;  . HERNIA REPAIR    . INGUINAL HERNIA REPAIR Right 04/19/2017   Procedure: HERNIA REPAIR INGUINAL ADULT;  Surgeon: Robert Bellow, MD;  Location: [facility];  Service: General;  Laterality: Right;  . WISDOM TOOTH EXTRACTION     Allergies  Allergen Reactions  . Aspirin Shortness Of Breath and Other (See Comments)    Heart beats fast  . Advil [Ibuprofen] Other (See Comments)    Breakout, heart palpitations and trouble breathing.   . Latex Rash   No current facility-administered medications on file prior to encounter.   Current Outpatient Medications on File Prior to Encounter  Medication Sig Dispense Refill  . acetaminophen (TYLENOL)  500 MG tablet Take 1 tablet (500 mg total) by mouth every 6 (six) hours as needed. 50 tablet 0  . Boric Acid Vaginal 600 MG SUPP Place 1 suppository vaginally 3 (three) times a week. Place supp in vagina 3 x weekly as needed 30 suppository 2  . medroxyPROGESTERone (DEPO-PROVERA) 150 MG/ML injection Inject 1 mL (150 mg total) into the muscle every 3 (three) months. 1 mL 3  . megestrol (MEGACE) 40 MG tablet 3x5d, 2x5d, then 1 daily to help control vaginal bleeding. Stop taking when bleeding stops. 45 tablet 1   Social History   Socioeconomic History  . Marital status: Single    Spouse name: Not on file  .  Number of children: 4  . Years of education: Not on file  . Highest education level: High school graduate  Occupational History    Comment: [employer], cafeteria  Tobacco Use  . Smoking status: Never Smoker  . Smokeless tobacco: Never Used  Vaping Use  . Vaping Use: Never used  Substance and Sexual Activity  . Alcohol use: No  . Drug use: No  . Sexual activity: Not Currently    Birth control/protection: Injection  Other Topics Concern  . Not on file  Social History Narrative   Lives with her 3 children      Zimiah (8)    Zatavious (6)      Zayla (1)- Daddy is present in life.       Social Determinants of Health   Financial Resource Strain: Not on file  Food Insecurity: Not on file  Transportation Needs: Not on file  Physical Activity: Not on file  Stress: Not on file  Social Connections: Not on file  Intimate Partner Violence: Not on file   Family History  Problem Relation Age of Onset  . Asthma Mother   . Asthma Sister   . Asthma Brother   . Asthma Daughter   . Diabetes Maternal Grandmother   . Hypertension Maternal Grandmother   . Asthma Maternal Grandmother   . Heart disease Maternal Grandmother   . Stroke Maternal Grandmother   . Hypertension Maternal Grandfather   . Asthma Maternal Grandfather   . Heart disease Maternal Grandfather   . Asthma Son   . Colon cancer Neg Hx   . Colon polyps Neg Hx     OBJECTIVE:  Vitals:   05/20/20 1801  BP: 123/81  Pulse: 90  Resp: 17  Temp: 98.6 F (37 C)  TempSrc: Oral  SpO2: 97%   General appearance: Alert in no acute distress HEENT: NCAT.  Oropharynx clear.  Lungs: clear to auscultation bilaterally without adventitious breath sounds Heart: regular rate and rhythm.   Abdomen: soft; non-distended; no tenderness; bowel sounds present; no guarding Back: no CVA tenderness Extremities: no edema; symmetrical with no gross deformities Skin: warm and dry Neurologic: Ambulates from chair to exam table without  difficulty Psychological: alert and cooperative; normal mood and affect  Labs Reviewed  POCT URINALYSIS DIP (MANUAL ENTRY) - Abnormal; Notable for the following components:      Result Value   Blood, UA moderate (*)    All other components within normal limits  URINE CULTURE  POCT URINE PREGNANCY  CERVICOVAGINAL ANCILLARY ONLY    ASSESSMENT & PLAN:  1. Vaginal odor   2. Urinary body odor     Meds ordered this encounter  Medications  . metroNIDAZOLE (FLAGYL) 500 MG tablet    Sig: Take 1 tablet (500 mg total) by mouth 2 (  two) times daily.    Dispense:  14 tablet    Refill:  0    Order Specific Question:   Supervising Provider    Answer:   Raylene Everts [[id number]]  . fluconazole (DIFLUCAN) 200 MG tablet    Sig: Take one dose by mouth, wait 72 hours, and then take second dose by mouth    Dispense:  2 tablet    Refill:  0    Order Specific Question:   Supervising Provider    Answer:   Raylene Everts [[id number]]   Will treat for possible bacterial vaginosis Cover for yeast infection secondary to antibiotic use Urine without signs of infection Urine culture sent.  We will call you with the results.   Push fluids and get plenty of rest.   Take antibiotic as directed and to completion Follow up with PCP for recheck Return here or go to ER if you have any new or worsening symptoms such as fever, worsening abdominal pain, nausea/vomiting, flank pain, etc...  Outlined signs and symptoms indicating need for more acute intervention. Patient verbalized understanding. After Visit Summary given.     Lestine Box, PA-C 05/20/20 1820

## 2020-05-21 LAB — CERVICOVAGINAL ANCILLARY ONLY
Bacterial Vaginitis (gardnerella): NEGATIVE
Candida Glabrata: NEGATIVE
Candida Vaginitis: NEGATIVE
Chlamydia: NEGATIVE
Comment: NEGATIVE
Comment: NEGATIVE
Comment: NEGATIVE
Comment: NEGATIVE
Comment: NEGATIVE
Comment: NORMAL
Neisseria Gonorrhea: NEGATIVE
Trichomonas: NEGATIVE

## 2020-05-22 LAB — URINE CULTURE: Special Requests: NORMAL

## 2020-05-26 ENCOUNTER — Other Ambulatory Visit: Payer: Medicaid Other

## 2020-06-26 ENCOUNTER — Other Ambulatory Visit: Payer: Self-pay

## 2020-06-26 ENCOUNTER — Encounter: Payer: Self-pay | Admitting: Emergency Medicine

## 2020-06-26 ENCOUNTER — Ambulatory Visit
Admission: EM | Admit: 2020-06-26 | Discharge: 2020-06-26 | Disposition: A | Discharge status: Home / Self Care | Payer: Medicaid Other | Attending: Emergency Medicine | Admitting: Emergency Medicine

## 2020-06-26 DIAGNOSIS — J069 Acute upper respiratory infection, unspecified: Secondary | ICD-10-CM | POA: Insufficient documentation

## 2020-06-26 LAB — POCT RAPID STREP A (OFFICE): Rapid Strep A Screen: NEGATIVE

## 2020-06-26 MED ORDER — BENZONATATE 100 MG PO CAPS: 100.0000 mg | ORAL_CAPSULE | Freq: Three times a day (TID) | ORAL | 0 refills | Status: DC

## 2020-06-26 MED ORDER — AMOXICILLIN-POT CLAVULANATE 875-125 MG PO TABS: 1.0000 | ORAL_TABLET | Freq: Two times a day (BID) | ORAL | 0 refills | Status: AC

## 2020-06-26 NOTE — Discharge Instructions (Addendum)

## 2020-06-26 NOTE — ED Provider Notes (Signed)
[place]   [id number] 06/26/20 Arrival Time: 1607   CC: COVID symptoms  SUBJECTIVE:  Deanna Hughes is a 30 y.o. female who presents with sore throat and cough that began today.  Denies sick exposure to COVID, flu or strep.  Denies alleviating or aggravating factors.  Reports previous symptoms in the past.   Denies fever, chills, fatigue, sinus pain, rhinorrhea, SOB, wheezing, chest pain, nausea, changes in bowel or bladder habits.    ROS: As per HPI.  All other pertinent ROS negative.     Past Medical History:  Diagnosis Date   Anxiety    BV (bacterial vaginosis) 04/02/2015   Chlamydia    treated 6/6   Chronic back pain    Chronic headaches    MIGRAINES   Contraceptive management 01/25/2013   Depression    Not on meds currently   Encounter for Nexplanon removal 02/27/2015   Irregular bleeding 06/03/2013   Mid back pain 05/17/2018   Miscarriage 03/27/2013   Neck pain 05/17/2018   Nexplanon insertion 04/11/2013   nexplanon inserted 04/11/13 remove 3/32/18   Right inguinal hernia 12/14/2015   Right sided abdominal pain    Scoliosis    Seasonal allergies 07/03/2012   Sinus infection 08/27/2013   Supervision of normal pregnancy 08/27/2015    [facility] at   7+5 weeks  FOB  Dating By  LMP  Pap  GC/CT Initial:                36+wks: Genetic Screen NT/IT:  CF screen  Anatomic [place]  Flu vaccine  Tdap Recommended ~ 28wks Glucose Screen  2 hr GBS  Feed Preference  Contraception  Circumcision  Childbirth Classes  Pediatrician     Threatened abortion in early pregnancy 03/20/2013   Vaginal discharge 10/03/2012   Yeast infection 07/03/2012   Past Surgical History:  Procedure Laterality Date   BIOPSY  09/18/2018   Procedure: BIOPSY;  Surgeon: Danie Binder, MD;  [facility];  Service: Endoscopy;;   CHOLECYSTECTOMY N/A 01/07/2020   Procedure: LAPAROSCOPIC CHOLECYSTECTOMY;  Surgeon: Felicie Morn, MD;  Location: [facility];  Service: General;   Laterality: N/A;   COLONOSCOPY WITH PROPOFOL N/A 09/18/2018   Procedure: COLONOSCOPY WITH PROPOFOL;  Surgeon: Danie Binder, MD;  [facility];  Service: Endoscopy;  Laterality: N/A;  8:30am   FLEXIBLE SIGMOIDOSCOPY N/A 10/05/2018   Procedure: FLEXIBLE SIGMOIDOSCOPY;  Surgeon: Danie Binder, MD;  [facility];  Service: Endoscopy;  Laterality: N/A;  8:30   HEMORRHOID BANDING N/A 10/05/2018   Procedure: Thayer Jew;  Surgeon: Danie Binder, MD;  [facility];  Service: Endoscopy;  Laterality: N/A;   HERNIA REPAIR     INGUINAL HERNIA REPAIR Right 04/19/2017   Procedure: HERNIA REPAIR INGUINAL ADULT;  Surgeon: Robert Bellow, MD;  Location: [facility];  Service: General;  Laterality: Right;   WISDOM TOOTH EXTRACTION     Allergies  Allergen Reactions   Aspirin Shortness Of Breath and Other (See Comments)    Heart beats fast   Advil [Ibuprofen] Other (See Comments)    Breakout, heart palpitations and trouble breathing.    Latex Rash   No current facility-administered medications on file prior to encounter.   Current Outpatient Medications on File Prior to Encounter  Medication Sig Dispense Refill   acetaminophen (TYLENOL) 500 MG tablet Take 1 tablet (500 mg total) by mouth every 6 (six) hours as needed. Nunda  tablet 0   Boric Acid Vaginal 600 MG SUPP Place 1 suppository vaginally 3 (three) times a week. Place supp in vagina 3 x weekly as needed 30 suppository 2   medroxyPROGESTERone (DEPO-PROVERA) 150 MG/ML injection Inject 1 mL (150 mg total) into the muscle every 3 (three) months. 1 mL 3   megestrol (MEGACE) 40 MG tablet 3x5d, 2x5d, then 1 daily to help control vaginal bleeding. Stop taking when bleeding stops. 45 tablet 1   Social History   Socioeconomic History   Marital status: Single    Spouse name: Not on file   Number of children: 4   Years of education: Not on file   Highest education level: High school graduate  Occupational History     Comment: Engineer, technical sales, cafeteria  Tobacco Use   Smoking status: Never   Smokeless tobacco: Never  Vaping Use   Vaping Use: Never used  Substance and Sexual Activity   Alcohol use: No   Drug use: No   Sexual activity: Not Currently    Birth control/protection: Injection  Other Topics Concern   Not on file  Social History Narrative   Lives with her 3 children      Zimiah (8)    Zatavious (6)      Zayla (1)- Daddy is present in life.       Social Determinants of Radio broadcast assistant Strain: Not on file  Food Insecurity: Not on file  Transportation Needs: Not on file  Physical Activity: Not on file  Stress: Not on file  Social Connections: Not on file  Intimate Partner Violence: Not on file   Family History  Problem Relation Age of Onset   Asthma Mother    Asthma Sister    Asthma Brother    Asthma Daughter    Diabetes Maternal Grandmother    Hypertension Maternal Grandmother    Asthma Maternal Grandmother    Heart disease Maternal Grandmother    Stroke Maternal Grandmother    Hypertension Maternal Grandfather    Asthma Maternal Grandfather    Heart disease Maternal Grandfather    Asthma Son    Colon cancer Neg Hx    Colon polyps Neg Hx     OBJECTIVE:  Vitals:   06/26/20 0853  BP: 108/76  Pulse: 97  Resp: 18  Temp: 98.1 F (36.7 C)  TempSrc: Tympanic  SpO2: 98%     General appearance: alert; appears mildly fatigued, but nontoxic; speaking in full sentences and tolerating own secretions HEENT: NCAT; Ears: EACs clear, TMs pearly gray; Eyes: PERRL.  EOM grossly intact. Nose: nares patent without rhinorrhea, Throat: oropharynx clear, tonsils non erythematous or enlarged, uvula midline  Neck: supple without LAD Lungs: unlabored respirations, symmetrical air entry; cough: absent; no respiratory distress; CTAB Heart: regular rate and rhythm.  Skin: warm and dry Psychological: alert and cooperative; normal mood and affect  LABS:  Results for orders  placed or performed during the hospital encounter of 06/26/20 (from the past 24 hour(s))  POCT rapid strep A     Status: None   Collection Time: 06/26/20  9:07 AM  Result Value Ref Range   Rapid Strep A Screen Negative Negative    ASSESSMENT & PLAN:  1. Viral URI with cough     Meds ordered this encounter  Medications   benzonatate (TESSALON) 100 MG capsule    Sig: Take 1 capsule (100 mg total) by mouth every 8 (eight) hours.    Dispense:  21 capsule  Refill:  0    Order Specific Question:   Supervising Provider    Answer:   Raylene Everts [[id number]]   amoxicillin-clavulanate (AUGMENTIN) 875-125 MG tablet    Sig: Take 1 tablet by mouth every 12 (twelve) hours for 10 days.    Dispense:  20 tablet    Refill:  0    Order Specific Question:   Supervising Provider    Answer:   Raylene Everts [[id number]]   Declines covid test Strep negative.  Culture sent.   Augmentin prescription for possible sinus infection Get plenty of rest and push fluids Tessalon Perles prescribed for cough Use OTC zyrtec for nasal congestion, runny nose, and/or sore throat Use OTC flonase for nasal congestion and runny nose Use medications daily for symptom relief Use OTC medications like ibuprofen or tylenol as needed fever or pain Call or go to the ED if you have any new or worsening symptoms such as fever, worsening cough, shortness of breath, chest tightness, chest pain, turning blue, changes in mental status, etc...   Reviewed expectations re: course of current medical issues. Questions answered. Outlined signs and symptoms indicating need for more acute intervention. Patient verbalized understanding. After Visit Summary given.          Lestine Box, PA-C 06/26/20 [phone]

## 2020-06-26 NOTE — ED Triage Notes (Signed)
Sore throat and cough that started today

## 2020-06-29 LAB — CULTURE, GROUP A STREP (THRC)

## 2020-07-06 ENCOUNTER — Ambulatory Visit: Payer: Medicaid Other

## 2020-07-09 ENCOUNTER — Ambulatory Visit: Payer: Medicaid Other | Admitting: Nurse Practitioner

## 2020-07-09 ENCOUNTER — Other Ambulatory Visit: Payer: Self-pay

## 2020-07-09 ENCOUNTER — Encounter: Payer: Self-pay | Admitting: Nurse Practitioner

## 2020-07-09 VITALS — BP 113/80 | HR 85 | Temp 98.6°F | Ht 62.0 in | Wt 188.0 lb

## 2020-07-09 DIAGNOSIS — M7989 Other specified soft tissue disorders: Secondary | ICD-10-CM | POA: Diagnosis not present

## 2020-07-09 DIAGNOSIS — J029 Acute pharyngitis, unspecified: Secondary | ICD-10-CM | POA: Diagnosis not present

## 2020-07-09 MED ORDER — NYSTATIN 100000 UNIT/ML MT SUSP: 5.0000 mL | Freq: Four times a day (QID) | OROMUCOSAL | 0 refills | Status: DC

## 2020-07-09 NOTE — Progress Notes (Signed)
Acute Office Visit  Subjective:    Patient ID: Deanna Hughes, female    DOB: 1990/12/11, 30 y.o.   MRN: [id number]  Chief Complaint  Patient presents with   Sore Throat    Ongoing x2-3 weeks ago, intermittent. Has white patches on the back of her throat.    Sore Throat   Patient is in today for sore throat x 2-3 weeks.  She had strep test that was negative. She has been doing salt water gargles. She was prescribed augmentin, but she only took 2 doses because her strep test was negative. Has noticed white spots on the back of her throat, and she has pain with swallowing.  Past Medical History:  Diagnosis Date   Anxiety    BV (bacterial vaginosis) 04/02/2015   Chlamydia    treated 6/6   Chronic back pain    Chronic headaches    MIGRAINES   Contraceptive management 01/25/2013   Depression    Not on meds currently   Encounter for Nexplanon removal 02/27/2015   Irregular bleeding 06/03/2013   Mid back pain 05/17/2018   Miscarriage 03/27/2013   Neck pain 05/17/2018   Nexplanon insertion 04/11/2013   nexplanon inserted 04/11/13 remove 3/32/18   Right inguinal hernia 12/14/2015   Right sided abdominal pain    Scoliosis    Seasonal allergies 07/03/2012   Sinus infection 08/27/2013   Supervision of normal pregnancy 08/27/2015    [facility] at   7+5 weeks  FOB  Dating By  LMP  Pap  GC/CT Initial:                36+wks: Genetic Screen NT/IT:  CF screen  Anatomic [place]  Flu vaccine  Tdap Recommended ~ 28wks Glucose Screen  2 hr GBS  Feed Preference  Contraception  Circumcision  Childbirth Classes  Pediatrician     Threatened abortion in early pregnancy 03/20/2013   Vaginal discharge 10/03/2012   Yeast infection 07/03/2012    Past Surgical History:  Procedure Laterality Date   BIOPSY  09/18/2018   Procedure: BIOPSY;  Surgeon: Danie Binder, MD;  [facility];  Service: Endoscopy;;   CHOLECYSTECTOMY N/A 01/07/2020   Procedure: LAPAROSCOPIC CHOLECYSTECTOMY;   Surgeon: Felicie Morn, MD;  [facility];  Service: General;  Laterality: N/A;   COLONOSCOPY WITH PROPOFOL N/A 09/18/2018   Procedure: COLONOSCOPY WITH PROPOFOL;  Surgeon: Danie Binder, MD;  [facility];  Service: Endoscopy;  Laterality: N/A;  8:30am   FLEXIBLE SIGMOIDOSCOPY N/A 10/05/2018   Procedure: FLEXIBLE SIGMOIDOSCOPY;  Surgeon: Danie Binder, MD;  [facility];  Service: Endoscopy;  Laterality: N/A;  8:30   HEMORRHOID BANDING N/A 10/05/2018   Procedure: Thayer Jew;  Surgeon: Danie Binder, MD;  [facility];  Service: Endoscopy;  Laterality: N/A;   HERNIA REPAIR     INGUINAL HERNIA REPAIR Right 04/19/2017   Procedure: HERNIA REPAIR INGUINAL ADULT;  Surgeon: Robert Bellow, MD;  Location: [facility];  Service: General;  Laterality: Right;   WISDOM TOOTH EXTRACTION      Family History  Problem Relation Age of Onset   Asthma Mother    Asthma Sister    Asthma Brother    Asthma Daughter    Diabetes Maternal Grandmother    Hypertension Maternal Grandmother    Asthma Maternal Grandmother    Heart disease Maternal Grandmother    Stroke Maternal Grandmother    Hypertension Maternal  Grandfather    Asthma Maternal Grandfather    Heart disease Maternal Grandfather    Asthma Son    Colon cancer Neg Hx    Colon polyps Neg Hx     Social History   Socioeconomic History   Marital status: Single    Spouse name: Not on file   Number of children: 4   Years of education: Not on file   Highest education level: High school graduate  Occupational History    Comment: Engineer, technical sales, cafeteria  Tobacco Use   Smoking status: Never   Smokeless tobacco: Never  Vaping Use   Vaping Use: Never used  Substance and Sexual Activity   Alcohol use: No   Drug use: No   Sexual activity: Not Currently    Birth control/protection: Injection  Other Topics Concern   Not on file  Social History Narrative   Lives with her 3 children      Zimiah  (8)    Zatavious (6)      Zayla (1)- Daddy is present in life.       Social Determinants of Health   Financial Resource Strain: Not on file  Food Insecurity: Not on file  Transportation Needs: Not on file  Physical Activity: Not on file  Stress: Not on file  Social Connections: Not on file  Intimate Partner Violence: Not on file    Outpatient Medications Prior to Visit  Medication Sig Dispense Refill   medroxyPROGESTERone (DEPO-PROVERA) 150 MG/ML injection Inject 1 mL (150 mg total) into the muscle every 3 (three) months. 1 mL 3   acetaminophen (TYLENOL) 500 MG tablet Take 1 tablet (500 mg total) by mouth every 6 (six) hours as needed. (Patient not taking: Reported on 07/09/2020) 50 tablet 0   benzonatate (TESSALON) 100 MG capsule Take 1 capsule (100 mg total) by mouth every 8 (eight) hours. (Patient not taking: Reported on 07/09/2020) 21 capsule 0   Boric Acid Vaginal 600 MG SUPP Place 1 suppository vaginally 3 (three) times a week. Place supp in vagina 3 x weekly as needed (Patient not taking: Reported on 07/09/2020) 30 suppository 2   megestrol (MEGACE) 40 MG tablet 3x5d, 2x5d, then 1 daily to help control vaginal bleeding. Stop taking when bleeding stops. (Patient not taking: Reported on 07/09/2020) 45 tablet 1   No facility-administered medications prior to visit.    Allergies  Allergen Reactions   Aspirin Shortness Of Breath and Other (See Comments)    Heart beats fast   Advil [Ibuprofen] Other (See Comments)    Breakout, heart palpitations and trouble breathing.    Latex Rash    Review of Systems  Constitutional: Negative.   HENT:  Positive for sore throat.        Pain with swallowing  Respiratory: Negative.    Cardiovascular:  Positive for leg swelling.      Objective:    Physical Exam Constitutional:      Appearance: She is well-developed.  HENT:     Mouth/Throat:     Tonsils: No tonsillar exudate or tonsillar abscesses. 0 on the right. 0 on the left.   Skin:    General: Skin is warm and dry.     Capillary Refill: Capillary refill takes less than 2 seconds.  Neurological:     Mental Status: She is alert.    There were no vitals taken for this visit. Wt Readings from Last 3 Encounters:  03/31/20 188 lb 8 oz (85.5 kg)  02/22/20 177 lb (80.3  kg)  01/07/20 178 lb 12.8 oz (81.1 kg)    Health Maintenance Due  Topic Date Due   Hepatitis C Screening  Never done    There are no preventive care reminders to display for this patient.   No results found for: TSH Lab Results  Component Value Date   WBC 6.9 07/10/2019   HGB 12.3 07/10/2019   HCT 38.4 07/10/2019   MCV 88.9 07/10/2019   PLT 139 (L) 07/10/2019   Lab Results  Component Value Date   NA 141 12/19/2019   K 4.1 12/19/2019   CO2 23 12/19/2019   GLUCOSE 101 (H) 12/19/2019   BUN 11 12/19/2019   CREATININE 0.63 12/19/2019   BILITOT 0.6 08/03/2018   ALKPHOS 63 01/26/2018   AST 11 08/03/2018   ALT 11 08/03/2018   PROT 6.9 08/03/2018   ALBUMIN 4.1 01/26/2018   CALCIUM 9.3 12/19/2019   ANIONGAP 6 01/26/2018   No results found for: CHOL No results found for: HDL No results found for: LDLCALC No results found for: TRIG No results found for: CHOLHDL Lab Results  Component Value Date   HGBA1C 5.3 04/04/2018       Assessment & Plan:   Problem List Items Addressed This Visit       Other   Leg swelling    -states she has right leg swelling that hurts so bad she has a hard time going to work -recommended compression stockings, and she states that she tried them before, and it made her hurt worse -referral to vein/vascular       Relevant Orders   Ambulatory referral to Vascular Surgery   Sore throat - Primary    -recent strep test was negative -she didn't take augmentin as prescribed d/t negative strep test -Throat swab today -since pain didn't respond to augmentin, Rx. Magic mouthwash       Relevant Medications   magic mouthwash (nystatin,  hydrocortisone, diphenhydrAMINE) suspension   Other Relevant Orders   Upper Respiratory Culture, Routine     Meds ordered this encounter  Medications   magic mouthwash (nystatin, hydrocortisone, diphenhydrAMINE) suspension    Sig: Swish and swallow 5 mLs 4 (four) times daily for 14 days.    Dispense:  280 mL    Refill:  0      Noreene Larsson, NP

## 2020-07-09 NOTE — Assessment & Plan Note (Signed)
-  recent strep test was negative -she didn't take augmentin as prescribed d/t negative strep test -Throat swab today -since pain didn't respond to augmentin, Rx. Magic mouthwash

## 2020-07-09 NOTE — Assessment & Plan Note (Signed)
-  states she has right leg swelling that hurts so bad she has a hard time going to work -recommended compression stockings, and she states that she tried them before, and it made her hurt worse -referral to vein/vascular

## 2020-07-13 LAB — UPPER RESPIRATORY CULTURE, ROUTINE

## 2020-07-13 NOTE — Progress Notes (Signed)
Nothing grew on throat swab. Is she still having pain?

## 2020-07-14 ENCOUNTER — Other Ambulatory Visit: Payer: Self-pay | Admitting: Nurse Practitioner

## 2020-07-14 DIAGNOSIS — J029 Acute pharyngitis, unspecified: Secondary | ICD-10-CM

## 2020-07-14 NOTE — Progress Notes (Signed)
I sent in ENT referral.

## 2020-07-14 NOTE — Progress Notes (Signed)
-  ENT referral for throat pain; tried abx and magic mouthwash

## 2020-07-22 ENCOUNTER — Other Ambulatory Visit: Payer: Self-pay | Admitting: *Deleted

## 2020-07-22 DIAGNOSIS — J029 Acute pharyngitis, unspecified: Secondary | ICD-10-CM

## 2020-07-22 MED ORDER — NYSTATIN 100000 UNIT/ML MT SUSP: 5.0000 mL | Freq: Four times a day (QID) | OROMUCOSAL | 0 refills | Status: AC

## 2020-07-23 ENCOUNTER — Ambulatory Visit (INDEPENDENT_AMBULATORY_CARE_PROVIDER_SITE_OTHER): Payer: Medicaid Other

## 2020-07-23 ENCOUNTER — Other Ambulatory Visit: Payer: Self-pay

## 2020-07-23 DIAGNOSIS — Z3042 Encounter for surveillance of injectable contraceptive: Secondary | ICD-10-CM

## 2020-07-23 MED ORDER — MEDROXYPROGESTERONE ACETATE 150 MG/ML IM SUSP
150.0000 mg | Freq: Once | INTRAMUSCULAR | Status: AC
  Administered 2020-07-23: 150 mg via INTRAMUSCULAR

## 2020-07-23 NOTE — Progress Notes (Signed)
   NURSE VISIT- INJECTION  SUBJECTIVE:  Deanna Hughes is a 30 y.o. [phone] female here for a Depo Provera for contraception/period management. She is a GYN patient.   OBJECTIVE:  There were no vitals taken for this visit.  Appears well, in no apparent distress  Injection administered in: Left upper quad. gluteus  Meds ordered this encounter  Medications   medroxyPROGESTERone (DEPO-PROVERA) injection 150 mg    ASSESSMENT: GYN patient Depo Provera for contraception/period management PLAN: Follow-up: in 11-13 weeks for next Depo   Gevon Markus A Zhaire Locker  07/23/2020 4:24 PM

## 2020-08-03 ENCOUNTER — Encounter: Payer: Medicaid Other | Admitting: Nurse Practitioner

## 2020-08-14 ENCOUNTER — Other Ambulatory Visit: Payer: Self-pay

## 2020-08-14 DIAGNOSIS — M7989 Other specified soft tissue disorders: Secondary | ICD-10-CM

## 2020-08-21 ENCOUNTER — Ambulatory Visit (HOSPITAL_COMMUNITY): Payer: Medicaid Other

## 2020-08-25 ENCOUNTER — Ambulatory Visit: Payer: Medicaid Other | Admitting: Nurse Practitioner

## 2020-09-17 ENCOUNTER — Ambulatory Visit: Payer: Medicaid Other | Admitting: Nurse Practitioner

## 2020-09-22 ENCOUNTER — Encounter: Payer: Self-pay | Admitting: Nurse Practitioner

## 2020-09-30 ENCOUNTER — Ambulatory Visit: Payer: Medicaid Other | Admitting: Nurse Practitioner

## 2020-10-05 ENCOUNTER — Encounter (HOSPITAL_COMMUNITY): Payer: Medicaid Other

## 2020-10-14 ENCOUNTER — Encounter: Payer: Self-pay | Admitting: Advanced Practice Midwife

## 2020-10-14 ENCOUNTER — Other Ambulatory Visit (HOSPITAL_COMMUNITY)
Admission: RE | Admit: 2020-10-14 | Discharge: 2020-10-14 | Disposition: A | Discharge status: Home / Self Care | Payer: Medicaid Other | Source: Ambulatory Visit | Attending: Advanced Practice Midwife | Admitting: Advanced Practice Midwife

## 2020-10-14 ENCOUNTER — Other Ambulatory Visit: Payer: Self-pay

## 2020-10-14 ENCOUNTER — Ambulatory Visit (INDEPENDENT_AMBULATORY_CARE_PROVIDER_SITE_OTHER): Payer: Medicaid Other | Admitting: Advanced Practice Midwife

## 2020-10-14 VITALS — BP 101/69 | HR 82 | Ht 62.0 in | Wt 178.4 lb

## 2020-10-14 DIAGNOSIS — R6882 Decreased libido: Secondary | ICD-10-CM | POA: Diagnosis not present

## 2020-10-14 DIAGNOSIS — Z01419 Encounter for gynecological examination (general) (routine) without abnormal findings: Secondary | ICD-10-CM

## 2020-10-14 NOTE — Progress Notes (Signed)
WELL-WOMAN EXAMINATION Patient name: Deanna Hughes MRN [id number]  Date of birth: [birth year] Chief Complaint:   Gynecologic Exam (Pap/ physcial/ last pap 09-28-17)  History of Present Illness:   Deanna Hughes is a 30 y.o. [phone] African-American female being seen today for a routine well-woman exam.  Current complaints: ever since her cholecystectomy in 12/2019 she feels her 'pH balance is off' as evidenced by noticing strange smells from her body and is asking about a blood screening for this; also reports no libido at all- questioning if this could be from Depo; states she has use Nexplanon in the past and never had any issues with it- wants to change back  PCP: Demetrius Revel NP      does not desire labs Patient's last menstrual period was 09/30/2020. The current method of family planning is Depo-Provera injections.  Last pap Sept 2019. Results were: NILM w/ HRHPV not done. H/O abnormal pap: no Last mammogram: never. Results were: N/A. Family h/o breast cancer: no Last colonoscopy: never. Results were: N/A. Family h/o colorectal cancer: no  Depression screen [facility] 2/9 10/14/2020 07/09/2020 10/03/2019 02/06/2019 12/31/2018  Decreased Interest 3 0 0 0 1  Down, Depressed, Hopeless 1 0 0 0 1  PHQ - 2 Score 4 0 0 0 2  Altered sleeping 0 - 0 - 0  Tired, decreased energy 2 - 0 - 1  Change in appetite 0 - 0 - 2  Feeling bad or failure about yourself  1 - 0 - 0  Trouble concentrating 0 - 0 - 0  Moving slowly or fidgety/restless 0 - 0 - 0  Suicidal thoughts 0 - 0 - 0  PHQ-9 Score 7 - 0 - 5  Difficult doing work/chores - - Not difficult at all - -  Some recent data might be hidden     GAD 7 : Generalized Anxiety Score 10/14/2020 10/02/2018  Nervous, Anxious, on Edge 0 2  Control/stop worrying 0 1  Worry too much - different things 0 1  Trouble relaxing 0 0  Restless 0 0  Easily annoyed or irritable 0 1  Afraid - awful might happen 0 0  Total GAD 7 Score 0 5  Anxiety Difficulty - Very  difficult     Review of Systems:   Pertinent items are noted in HPI Denies any headaches, blurred vision, fatigue, shortness of breath, chest pain, abdominal pain, abnormal vaginal discharge/itching/odor/irritation, problems with periods, bowel movements, urination, or intercourse unless otherwise stated above. Pertinent History Reviewed:  Reviewed past medical,surgical, social and family history.  Reviewed problem list, medications and allergies. Physical Assessment:   Vitals:   10/14/20 1455  BP: 101/69  Pulse: 82  Weight: 178 lb 6.4 oz (80.9 kg)  Height: 5\' 2"  (1.575 m)  Body mass index is 32.63 kg/m.        Physical Examination:   General appearance - well appearing, and in no distress  Mental status - alert, oriented to person, place, and time  Psych:  She has a normal mood and affect  Skin - warm and dry, normal color, no suspicious lesions noted  Chest - effort normal, all lung fields clear to auscultation bilaterally  Heart - normal rate and regular rhythm  Neck:  midline trachea, no thyromegaly or nodules  Breasts - breasts appear normal, no suspicious masses, no skin or nipple changes or  axillary nodes  Abdomen - soft, nontender, nondistended, no masses or organomegaly  Pelvic - VULVA: normal appearing vulva with no  masses, tenderness or lesions  VAGINA: normal appearing vagina with normal color and discharge, no lesions  CERVIX: normal appearing cervix without discharge or lesions, no CMT  Thin prep pap is done with HR HPV cotesting  UTERUS: uterus is felt to be normal size, shape, consistency and nontender   ADNEXA: No adnexal masses or tenderness noted.  Extremities:  No swelling or varicosities noted  Chaperone: Peggy Dones    No results found for this or any previous visit (from the past 24 hour(s)).  Assessment & Plan:  1) Well-Woman Exam  2) No libido, given info on Addyi and encouraged to make an appt with Manus Gunning if interested  3) 'Strange smell' from her  body ever since cholecystectomy 12/2019, reviewed that I don't know how to evaluate for this; requests vag infection screening with her Pap- will run  4) Desires to change to Nexplanon, will schedule ASAP- Depo due later this week, so won't be a problem, and states she isn't having sex due to libido issues  Labs/procedures today: Pap with tests for vag infection  Mammogram: @ 28yo, or sooner if problems Colonoscopy: @ 28yo, or sooner if problems  No orders of the defined types were placed in this encounter.   Meds: No orders of the defined types were placed in this encounter.   Follow-up: Return for cancel Depo inj; reschedule 1st avail Nex insertion.  Myrtis Ser CNM 10/14/2020 4:10 PM

## 2020-10-15 ENCOUNTER — Ambulatory Visit: Payer: Medicaid Other

## 2020-10-16 ENCOUNTER — Ambulatory Visit: Payer: Self-pay

## 2020-10-19 ENCOUNTER — Other Ambulatory Visit: Payer: Self-pay

## 2020-10-19 ENCOUNTER — Encounter: Payer: Self-pay | Admitting: Adult Health

## 2020-10-19 ENCOUNTER — Ambulatory Visit: Payer: Medicaid Other | Admitting: Adult Health

## 2020-10-19 VITALS — BP 105/71 | HR 77 | Ht 62.0 in | Wt 178.0 lb

## 2020-10-19 DIAGNOSIS — Z3202 Encounter for pregnancy test, result negative: Secondary | ICD-10-CM | POA: Diagnosis not present

## 2020-10-19 DIAGNOSIS — Z30011 Encounter for initial prescription of contraceptive pills: Secondary | ICD-10-CM | POA: Diagnosis not present

## 2020-10-19 LAB — CYTOLOGY - PAP
Chlamydia: NEGATIVE
Comment: NEGATIVE
Comment: NEGATIVE
Comment: NEGATIVE
Comment: NORMAL
Diagnosis: NEGATIVE
High risk HPV: NEGATIVE
Neisseria Gonorrhea: NEGATIVE
Trichomonas: NEGATIVE

## 2020-10-19 LAB — POCT URINE PREGNANCY: Preg Test, Ur: NEGATIVE

## 2020-10-19 MED ORDER — LO LOESTRIN FE 1 MG-10 MCG / 10 MCG PO TABS: 1.0000 | ORAL_TABLET | Freq: Every day | ORAL | 11 refills | Status: DC

## 2020-10-19 MED ORDER — ETONOGESTREL 68 MG ~~LOC~~ IMPL: 68.0000 mg | DRUG_IMPLANT | Freq: Once | SUBCUTANEOUS | Status: DC

## 2020-10-19 NOTE — Progress Notes (Signed)
  Subjective:     Patient ID: Deanna Hughes, female   DOB: 29-Dec-1990, 30 y.o.   MRN: [id number]  HPI [place] is a 30 year old black female,single, [id number], in for possible nexaplanon insertion, has been on depo and has no libido, but after getting in room wants to try the pill.  Lab Results  Component Value Date   DIAGPAP  10/14/2020    - Negative for intraepithelial lesion or malignancy (NILM)   [place] Negative 10/14/2020   PCP is Jonetta Osgood NP  Review of Systems No libido  Reviewed past medical,surgical, social and family history. Reviewed medications and allergies.     Objective:   Physical Exam BP 105/71 (BP Location: Left Arm, Patient Position: Sitting, Cuff Size: Normal)   Pulse 77   Ht 5\' 2"  (1.575 m)   Wt 178 lb (80.7 kg)   LMP 09/30/2020   Breastfeeding No   BMI 32.56 kg/m  UPT is negative.  She had pap and physical 10/14/20. Talk only: she want to try the pill to see if libido better.  [place] - 10/19/20 1144       Pregnancy Intention Screening   Does the patient want to become pregnant in the next year? No    Does the patient's partner want to become pregnant in the next year? No    Would the patient like to discuss contraceptive options today? Yes      Contraception Wrap Up   Current Method Hormonal Injection    End Method Oral Contraceptive;Female Condom    Contraception Counseling Provided Yes                Assessment:     1. Encounter for initial prescription of contraceptive pills Gave 1 pack of lo Loestrin to start today and use condoms if has sex Meds ordered this encounter  Medications   DISCONTD: etonogestrel (NEXPLANON) implant 68 mg   Norethindrone-Ethinyl Estradiol-Fe Biphas (LO LOESTRIN FE) 1 MG-10 MCG / 10 MCG tablet    Sig: Take 1 tablet by mouth daily. Take 1 daily by mouth    Dispense:  28 tablet    Refill:  11    BIN [id number], PCN [id number], GRP [id number]    Order Specific Question:   Supervising Provider    Answer:   Florian Buff [[id number]]       Plan:     Follow up in 3 months

## 2020-12-09 ENCOUNTER — Ambulatory Visit: Payer: Self-pay

## 2020-12-14 ENCOUNTER — Other Ambulatory Visit: Payer: Medicaid Other

## 2020-12-16 ENCOUNTER — Ambulatory Visit (INDEPENDENT_AMBULATORY_CARE_PROVIDER_SITE_OTHER): Payer: Medicaid Other | Admitting: *Deleted

## 2020-12-16 ENCOUNTER — Other Ambulatory Visit: Payer: Self-pay

## 2020-12-16 ENCOUNTER — Other Ambulatory Visit (HOSPITAL_COMMUNITY)
Admission: RE | Admit: 2020-12-16 | Discharge: 2020-12-16 | Disposition: A | Discharge status: Home / Self Care | Payer: Medicaid Other | Source: Ambulatory Visit | Attending: Obstetrics & Gynecology | Admitting: Obstetrics & Gynecology

## 2020-12-16 DIAGNOSIS — N898 Other specified noninflammatory disorders of vagina: Secondary | ICD-10-CM

## 2020-12-16 DIAGNOSIS — R309 Painful micturition, unspecified: Secondary | ICD-10-CM | POA: Diagnosis not present

## 2020-12-16 DIAGNOSIS — R8290 Unspecified abnormal findings in urine: Secondary | ICD-10-CM | POA: Diagnosis not present

## 2020-12-16 LAB — POCT URINALYSIS DIPSTICK
Blood, UA: NEGATIVE
Glucose, UA: NEGATIVE
Ketones, UA: NEGATIVE
Leukocytes, UA: NEGATIVE
Nitrite, UA: NEGATIVE
Protein, UA: NEGATIVE

## 2020-12-16 NOTE — Progress Notes (Signed)
Chart reviewed for nurse visit. Agree with plan of care.  Estill Dooms, NP 12/16/2020 5:31 PM

## 2020-12-16 NOTE — Progress Notes (Signed)
   NURSE VISIT- VAGINITIS  SUBJECTIVE:  Deanna Hughes is a 30 y.o. [id number] GYN patientfemale here for a vaginal swab for vaginitis screening.  She reports the following symptoms:  vaginal discharge and odor  for 3 weeks. Pt also reports pain with urination and strong odor with urine.  Denies abnormal vaginal bleeding, significant pelvic pain, fever, or UTI symptoms.  OBJECTIVE:  There were no vitals taken for this visit.  Appears well, in no apparent distress  ASSESSMENT: Vaginal swab for vaginitis screening. Urine sent to lab for u/a and culture.   PLAN: Self-collected vaginal probe for Bacterial Vaginosis, Yeast sent to lab Treatment: to be determined once results are received Follow-up as needed if symptoms persist/worsen, or new symptoms develop  Levy Pupa  12/16/2020 3:22 PM

## 2020-12-17 LAB — URINALYSIS, ROUTINE W REFLEX MICROSCOPIC
Bilirubin, UA: NEGATIVE
Glucose, UA: NEGATIVE
Ketones, UA: NEGATIVE
Leukocytes,UA: NEGATIVE
Nitrite, UA: NEGATIVE
Protein,UA: NEGATIVE
RBC, UA: NEGATIVE
Specific Gravity, UA: 1.028 (ref 1.005–1.030)
Urobilinogen, Ur: 2 mg/dL — ABNORMAL HIGH (ref 0.2–1.0)
pH, UA: 7 (ref 5.0–7.5)

## 2020-12-18 LAB — CERVICOVAGINAL ANCILLARY ONLY
Bacterial Vaginitis (gardnerella): POSITIVE — AB
Candida Glabrata: NEGATIVE
Candida Vaginitis: NEGATIVE
Comment: NEGATIVE
Comment: NEGATIVE
Comment: NEGATIVE

## 2020-12-19 LAB — URINE CULTURE

## 2020-12-21 ENCOUNTER — Other Ambulatory Visit: Payer: Self-pay | Admitting: Adult Health

## 2020-12-21 MED ORDER — METRONIDAZOLE 500 MG PO TABS: 500.0000 mg | ORAL_TABLET | Freq: Two times a day (BID) | ORAL | 0 refills | Status: DC

## 2020-12-21 NOTE — Progress Notes (Signed)
+  BV will rx flagyl

## 2020-12-29 DIAGNOSIS — Z23 Encounter for immunization: Secondary | ICD-10-CM | POA: Diagnosis not present

## 2020-12-29 DIAGNOSIS — Z0001 Encounter for general adult medical examination with abnormal findings: Secondary | ICD-10-CM | POA: Diagnosis not present

## 2020-12-29 DIAGNOSIS — N390 Urinary tract infection, site not specified: Secondary | ICD-10-CM | POA: Diagnosis not present

## 2020-12-30 DIAGNOSIS — Z7689 Persons encountering health services in other specified circumstances: Secondary | ICD-10-CM | POA: Diagnosis not present

## 2021-01-01 DIAGNOSIS — E559 Vitamin D deficiency, unspecified: Secondary | ICD-10-CM | POA: Insufficient documentation

## 2021-01-12 DIAGNOSIS — M419 Scoliosis, unspecified: Secondary | ICD-10-CM | POA: Insufficient documentation

## 2021-01-12 DIAGNOSIS — Z0001 Encounter for general adult medical examination with abnormal findings: Secondary | ICD-10-CM | POA: Diagnosis not present

## 2021-01-21 ENCOUNTER — Ambulatory Visit: Payer: Medicaid Other | Admitting: Adult Health

## 2021-03-04 DIAGNOSIS — N760 Acute vaginitis: Secondary | ICD-10-CM | POA: Diagnosis not present

## 2021-03-11 DIAGNOSIS — N898 Other specified noninflammatory disorders of vagina: Secondary | ICD-10-CM | POA: Diagnosis not present

## 2021-03-13 ENCOUNTER — Emergency Department (HOSPITAL_COMMUNITY): Payer: BLUE CROSS/BLUE SHIELD

## 2021-03-13 ENCOUNTER — Other Ambulatory Visit: Payer: Self-pay

## 2021-03-13 ENCOUNTER — Encounter (HOSPITAL_COMMUNITY): Payer: Self-pay | Admitting: Emergency Medicine

## 2021-03-13 ENCOUNTER — Emergency Department (HOSPITAL_COMMUNITY)
Admission: EM | Admit: 2021-03-13 | Discharge: 2021-03-13 | Disposition: A | Discharge status: Home / Self Care | Payer: BLUE CROSS/BLUE SHIELD | Attending: Emergency Medicine | Admitting: Emergency Medicine

## 2021-03-13 DIAGNOSIS — R0789 Other chest pain: Secondary | ICD-10-CM | POA: Diagnosis not present

## 2021-03-13 DIAGNOSIS — Z91040 Latex allergy status: Secondary | ICD-10-CM | POA: Insufficient documentation

## 2021-03-13 DIAGNOSIS — R079 Chest pain, unspecified: Secondary | ICD-10-CM

## 2021-03-13 LAB — BASIC METABOLIC PANEL
Anion gap: 7 (ref 5–15)
BUN: 11 mg/dL (ref 6–20)
CO2: 24 mmol/L (ref 22–32)
Calcium: 9.1 mg/dL (ref 8.9–10.3)
Chloride: 104 mmol/L (ref 98–111)
Creatinine, Ser: 0.63 mg/dL (ref 0.44–1.00)
GFR, Estimated: >60 mL/min (ref >60)
Glucose, Bld: 92 mg/dL (ref 70–99)
Potassium: 3.6 mmol/L (ref 3.5–5.1)
Sodium: 135 mmol/L (ref 135–145)

## 2021-03-13 LAB — CBC
HCT: 39.6 % (ref 36.0–46.0)
Hemoglobin: 13 g/dL (ref 12.0–15.0)
MCH: 29.4 pg (ref 26.0–34.0)
MCHC: 32.8 g/dL (ref 30.0–36.0)
MCV: 89.6 fL (ref 80.0–100.0)
Platelets: 193 10*3/uL (ref 150–400)
RBC: 4.42 MIL/uL (ref 3.87–5.11)
RDW: 12.9 % (ref 11.5–15.5)
WBC: 6.3 10*3/uL (ref 4.0–10.5)
nRBC: 0 % (ref 0.0–0.2)

## 2021-03-13 LAB — TROPONIN I (HIGH SENSITIVITY)
Troponin I (High Sensitivity): <2 ng/L (ref <18)
Troponin I (High Sensitivity): <2 ng/L (ref <18)

## 2021-03-13 LAB — POC URINE PREG, ED: Preg Test, Ur: NEGATIVE

## 2021-03-13 MED ORDER — HYDROXYZINE HCL 25 MG PO TABS
25.0000 mg | ORAL_TABLET | Freq: Once | ORAL | Status: AC
  Administered 2021-03-13: 25 mg via ORAL
  Filled 2021-03-13: qty 1

## 2021-03-13 NOTE — ED Triage Notes (Signed)
Pt presents to ED with complaints of mid chest  throbbing and left arm tingling started yesterday at 1200. Pt states she works at [employer] and was at work when the pain started.

## 2021-03-13 NOTE — Discharge Instructions (Addendum)
Please follow-up with your primary care doctor sometime next week for further evaluation.  Return to the emergency department sooner for worsening symptoms.

## 2021-03-13 NOTE — ED Provider Notes (Signed)
[facility] EMERGENCY DEPARTMENT Provider Note   CSN: [id number] Arrival date & time: 03/13/21  1056     History Chief Complaint  Patient presents with   Chest Pain    Deanna Hughes is a 31 y.o. female with no significant past medical history who presents to the emergency department with left-sided constant sharp and throbbing chest pain that radiates into the left arm that started last night around 11 PM.  Patient states it is painful to take deep breaths at times.  She denies any shortness of breath, fever, chills, cough, congestion, abdominal pain, nausea, vomiting, diarrhea, diaphoresis.  Patient has family history of cardiac disease in her grandparents.  Patient has not had any recent surgeries, leg swelling or pain, she is not on OCPs, she has not had any recent travel.   Chest Pain  HPI: A 31 year old patient with a history of obesity presents for evaluation of chest pain. Initial onset of pain was more than 6 hours ago. The patient's chest pain is sharp and is not worse with exertion. The patient's chest pain is middle- or left-sided, is not well-localized, is not described as heaviness/pressure/tightness and does radiate to the arms/jaw/neck. The patient does not complain of nausea and denies diaphoresis. The patient has a family history of coronary artery disease in a first-degree relative with onset less than age 81. The patient has no history of stroke, has no history of peripheral artery disease, has not smoked in the past 90 days, denies any history of treated diabetes, is not hypertensive and has no history of hypercholesterolemia.   Home Medications Prior to Admission medications   Medication Sig Start Date End Date Taking? Authorizing Provider  metroNIDAZOLE (FLAGYL) 500 MG tablet Take 1 tablet (500 mg total) by mouth 2 (two) times daily. 12/21/20   Estill Dooms, NP  fluconazole (DIFLUCAN) 200 MG tablet Take by mouth. 03/04/21   [provider]   metroNIDAZOLE (METROGEL) 0.75 % vaginal gel Place vaginally at bedtime. 03/11/21   [provider]  nitrofurantoin, macrocrystal-monohydrate, (MACROBID) 100 MG capsule Take 100 mg by mouth every 12 (twelve) hours. 12/29/20   [provider]  Norethindrone-Ethinyl Estradiol-Fe Biphas (LO LOESTRIN FE) 1 MG-10 MCG / 10 MCG tablet Take 1 tablet by mouth daily. Take 1 daily by mouth 10/19/20   Derrek Monaco A, NP  tiZANidine (ZANAFLEX) 4 MG tablet Take 4 mg by mouth every 6 (six) hours as needed. 03/04/21   [provider]  Vitamin D, Ergocalciferol, (DRISDOL) 1.25 MG (50000 UNIT) CAPS capsule Take 50,000 Units by mouth once a week. 01/01/21   [provider]      Allergies    Aspirin, Advil [ibuprofen], and Latex    Review of Systems   Review of Systems  Cardiovascular:  Positive for chest pain.  All other systems reviewed and are negative.  Physical Exam Updated Vital Signs BP 105/74    Pulse 74    Temp 98.2 F (36.8 C) (Oral)    Resp 19    Ht 5\' 2"  (1.575 m)    Wt 81.2 kg    LMP 02/27/2021    SpO2 100%    BMI 32.74 kg/m  Physical Exam Vitals and nursing note reviewed.  Constitutional:      General: She is not in acute distress.    Appearance: Normal appearance.  HENT:     Head: Normocephalic and atraumatic.  Eyes:     General:        Right  eye: No discharge.        Left eye: No discharge.  Cardiovascular:     Comments: Regular rate and rhythm.  S1/S2 are distinct without any evidence of murmur, rubs, or gallops.  Radial pulses are 2+ bilaterally.  Dorsalis pedis pulses are 2+ bilaterally.  No evidence of pedal edema. Pulmonary:     Comments: Clear to auscultation bilaterally.  Normal effort.  No respiratory distress.  No evidence of wheezes, rales, or rhonchi heard throughout. Chest:     Comments: Chest pain is reproducible with palpation of the left anterior chest wall. Abdominal:     General: Abdomen is flat. Bowel sounds are normal. There  is no distension.     Tenderness: There is no abdominal tenderness. There is no guarding or rebound.  Musculoskeletal:        General: Normal range of motion.     Cervical back: Neck supple.  Skin:    General: Skin is warm and dry.     Findings: No rash.  Neurological:     General: No focal deficit present.     Mental Status: She is alert.  Psychiatric:        Mood and Affect: Mood normal.        Behavior: Behavior normal.    ED Results / Procedures / Treatments   Labs (all labs ordered are listed, but only abnormal results are displayed) Labs Reviewed  BASIC METABOLIC PANEL  CBC  POC URINE PREG, ED  TROPONIN I (HIGH SENSITIVITY)  TROPONIN I (HIGH SENSITIVITY)    EKG None  Radiology DG Chest 2 View  Result Date: 03/13/2021 CLINICAL DATA:  Acute onset chest and left arm pain beginning several hours ago. EXAM: CHEST - 2 VIEW COMPARISON:  03/27/2018 FINDINGS: The heart size and mediastinal contours are within normal limits. Both lungs are clear. Mild thoracic dextroscoliosis shows no significant change. IMPRESSION: No active cardiopulmonary disease. Electronically Signed   By: Marlaine Hind M.D.   On: 03/13/2021 12:11    Procedures Procedures    Medications Ordered in ED Medications  hydrOXYzine (ATARAX) tablet 25 mg (25 mg Oral Given 03/13/21 1421)    ED Course/ Medical Decision Making/ A&P   HEAR Score: 1                       Medical Decision Making Amount and/or Complexity of Data Reviewed Labs: ordered. Radiology: ordered.  Risk Prescription drug management.   This patient presents to the ED for concern of left-sided chest pain, this involves an extensive number of treatment options, and is a complaint that carries with it a high risk of complications and morbidity.  The differential diagnosis includes ACS, costochondritis.  I have a low suspicion for pulmonary embolism as she does not have any significant risk factors or abnormal vital signs.  I doubt  dissection is history is not consistent with this nor is she hypertensive.  No history of vomiting and I doubt Boerhaave's.  I doubt pneumothorax.   Co morbidities that complicate the patient evaluation  Anxiety depression   Additional history obtained:  Additional history obtained from old records External records from outside source obtained and reviewed including old echo report from approximately 2 years ago which showed an ejection fraction of 55% and normal left ventricle function.   Lab Tests:  I Ordered, and personally interpreted labs.  The pertinent results include: CBC which was negative for any leukocytosis or anemia.  Pregnancy was negative.  BMP showed no abnormalities of the electrolytes or kidney function.  Initial and delta troponin were negative.   Imaging Studies ordered:  I ordered imaging studies including chest x-ray I independently visualized and interpreted imaging which showed no acute abnormalities.  Cardiac silhouette was normal.  No pneumothorax.  I agree with the radiologist interpretation   Cardiac Monitoring:  The patient was maintained on a cardiac monitor.  I personally viewed and interpreted the cardiac monitored which showed an underlying rhythm of: Normal sinus rhythm.   Medicines ordered and prescription drug management:  I ordered medication including Atarax for chest pain with hx of anxiety. Reevaluation of the patient after these medicines showed that the patient stayed the same I have reviewed the patients home medicines and have made adjustments as needed   Test Considered:  D-dimer.  Patient's vital signs have been completely normal here.  Patient also does not have any significant risk factors for pulmonary embolism at this time.   Problem List / ED Course:  Chest pain.  Unlikely to be ACS or pulmonary embolism.  Patient still having pain after Atarax.  Vital signs are completely normal and EKG is normal.  Heart score was 1.  Patient has a primary care doctor and I will have him follow-up with them this week.  I discussed strict return precautions.  Patient understood.  She is safe discharge.   Reevaluation:  After the interventions noted above, I reevaluated the patient and found that they have :stayed the same   Dispostion:  After consideration of the diagnostic results and the patients response to treatment, I feel that the patent would benefit from outpatient follow-up.  Strict turn precautions were discussed.  She is here for discharge.  Final Clinical Impression(s) / ED Diagnoses Final diagnoses:  Chest pain, unspecified type    Rx / DC Orders ED Discharge Orders     None         Hendricks Limes, [place] 03/13/21 1459    Milton Ferguson, MD 03/15/21 [phone]

## 2021-03-15 ENCOUNTER — Telehealth: Payer: Self-pay

## 2021-03-15 NOTE — Telephone Encounter (Signed)
Transition Care Management Follow-up Telephone Call Date of discharge and from where: 03/13/2021-[facility]  How have you been since you were released from the hospital? Pt stated she is doing fine and has her medications.  Any questions or concerns? No  Items Reviewed: Did the pt receive and understand the discharge instructions provided? Yes  Medications obtained and verified? Yes  Other? No  Any new allergies since your discharge? No  Dietary orders reviewed? No Do you have support at home? Yes   Home Care and Equipment/Supplies: Were home health services ordered? not applicable If so, what is the name of the agency? N/A  Has the agency set up a time to come to the patient's home? not applicable Were any new equipment or medical supplies ordered?  No What is the name of the medical supply agency? N/A Were you able to get the supplies/equipment? not applicable Do you have any questions related to the use of the equipment or supplies? No  Functional Questionnaire: (I = Independent and D = Dependent) ADLs: I  Bathing/Dressing- I  Meal Prep- I  Eating- I  Maintaining continence- I  Transferring/Ambulation- I  Managing Meds- I  Follow up appointments reviewed:  PCP Hospital f/u appt confirmed? No   Specialist Hospital f/u appt confirmed? No   Are transportation arrangements needed? No  If their condition worsens, is the pt aware to call PCP or go to the Emergency Dept.? Yes Was the patient provided with contact information for the PCP's office or ED? Yes Was to pt encouraged to call back with questions or concerns? Yes

## 2021-04-03 ENCOUNTER — Other Ambulatory Visit: Payer: Self-pay | Admitting: Advanced Practice Midwife

## 2021-04-22 ENCOUNTER — Ambulatory Visit (INDEPENDENT_AMBULATORY_CARE_PROVIDER_SITE_OTHER): Payer: BC Managed Care – PPO

## 2021-04-22 ENCOUNTER — Ambulatory Visit
Admission: RE | Admit: 2021-04-22 | Discharge: 2021-04-22 | Disposition: A | Discharge status: Home / Self Care | Payer: BC Managed Care – PPO | Source: Ambulatory Visit | Attending: Urgent Care | Admitting: Urgent Care

## 2021-04-22 VITALS — BP 106/70 | HR 76 | Temp 99.0°F | Resp 16

## 2021-04-22 DIAGNOSIS — M79671 Pain in right foot: Secondary | ICD-10-CM

## 2021-04-22 DIAGNOSIS — H6982 Other specified disorders of Eustachian tube, left ear: Secondary | ICD-10-CM | POA: Diagnosis not present

## 2021-04-22 DIAGNOSIS — S9031XA Contusion of right foot, initial encounter: Secondary | ICD-10-CM

## 2021-04-22 DIAGNOSIS — M7989 Other specified soft tissue disorders: Secondary | ICD-10-CM | POA: Diagnosis not present

## 2021-04-22 MED ORDER — FLUTICASONE PROPIONATE 50 MCG/ACT NA SUSP: 2.0000 | Freq: Every day | NASAL | 12 refills | Status: DC

## 2021-04-22 MED ORDER — PSEUDOEPHEDRINE HCL 30 MG PO TABS: 30.0000 mg | ORAL_TABLET | Freq: Three times a day (TID) | ORAL | 0 refills | Status: DC | PRN

## 2021-04-22 MED ORDER — LEVOCETIRIZINE DIHYDROCHLORIDE 5 MG PO TABS
5.0000 mg | ORAL_TABLET | Freq: Every evening | ORAL | 0 refills | Status: DC
Start: 2021-04-22 — End: 2021-09-13

## 2021-04-22 MED ORDER — PREDNISONE 10 MG PO TABS: 20.0000 mg | ORAL_TABLET | Freq: Every day | ORAL | 0 refills | Status: DC

## 2021-04-22 NOTE — ED Triage Notes (Signed)
Pt reports pain and swelling in right foot x 2 weeks after someone dropped some woods. States she has some tingling sensation in right toes.  ? ?Pt report bilateral ear pain x 2-3 days.  ?

## 2021-04-22 NOTE — ED Provider Notes (Signed)
?[facility] ? ? ?MRN: [id number] DOB: 1990/03/02 ? ?Subjective:  ? ?Deanna Hughes is a 31 y.o. female presenting for 2 week history of persistent right foot pain.  Patient reports that someone dropped a load of wood over her foot at work.  She has since had pain, tingling in her toes and feet.  She has continued to work and has not taken time off.  Has not used any anti-inflammatory pain medications as she has allergies to NSAIDs.  Has also had bilateral ear pain worse over the left, runny and stuffy nose.  No cough, chest pain, shortness of breath. ? ?No current facility-administered medications for this encounter. ? ?Current Outpatient Medications:  ?  metroNIDAZOLE (FLAGYL) 500 MG tablet, Take 1 tablet (500 mg total) by mouth 2 (two) times daily., Disp: 14 tablet, Rfl: 0 ?  fluconazole (DIFLUCAN) 200 MG tablet, Take by mouth., Disp: , Rfl:  ?  fluticasone (FLONASE) 50 MCG/ACT nasal spray, SHAKE LIQUID AND USE 1 SPRAY IN EACH NOSTRIL DAILY, Disp: 16 g, Rfl: 2 ?  metroNIDAZOLE (METROGEL) 0.75 % vaginal gel, Place vaginally at bedtime., Disp: , Rfl:  ?  nitrofurantoin, macrocrystal-monohydrate, (MACROBID) 100 MG capsule, Take 100 mg by mouth every 12 (twelve) hours., Disp: , Rfl:  ?  Norethindrone-Ethinyl Estradiol-Fe Biphas (LO LOESTRIN FE) 1 MG-10 MCG / 10 MCG tablet, Take 1 tablet by mouth daily. Take 1 daily by mouth, Disp: 28 tablet, Rfl: 11 ?  tiZANidine (ZANAFLEX) 4 MG tablet, Take 4 mg by mouth every 6 (six) hours as needed., Disp: , Rfl:  ?  Vitamin D, Ergocalciferol, (DRISDOL) 1.25 MG (50000 UNIT) CAPS capsule, Take 50,000 Units by mouth once a week., Disp: , Rfl:   ? ?Allergies  ?Allergen Reactions  ? Aspirin Shortness Of Breath and Other (See Comments)  ?  Heart beats fast  ? Advil [Ibuprofen] Other (See Comments)  ?  Breakout, heart palpitations and trouble breathing.   ? Latex Rash  ? ? ?Past Medical History:  ?Diagnosis Date  ? Anxiety   ? BV (bacterial vaginosis) 04/02/2015  ?  Chlamydia   ? treated 6/6  ? Chronic back pain   ? Chronic headaches   ? MIGRAINES  ? Contraceptive management 01/25/2013  ? Depression   ? Not on meds currently  ? Encounter for Nexplanon removal 02/27/2015  ? Irregular bleeding 06/03/2013  ? Mid back pain 05/17/2018  ? Miscarriage 03/27/2013  ? Neck pain 05/17/2018  ? Nexplanon insertion 04/11/2013  ? nexplanon inserted 04/11/13 remove 3/32/18  ? Right inguinal hernia 12/14/2015  ? Right sided abdominal pain   ? Scoliosis   ? Seasonal allergies 07/03/2012  ? Sinus infection 08/27/2013  ? Supervision of normal pregnancy 08/27/2015  ?  [place] Initiated Care at   7+5 weeks  FOB  Dating By  LMP  Pap  GC/CT Initial:                36+wks: Genetic Screen NT/IT:  CF screen  Anatomic [place]  Flu vaccine  Tdap Recommended ~ 28wks Glucose Screen  2 hr GBS  Feed Preference  Contraception  Circumcision  Childbirth Classes  Pediatrician    ? Threatened abortion in early pregnancy 03/20/2013  ? Vaginal discharge 10/03/2012  ? Yeast infection 07/03/2012  ?  ? ?Past Surgical History:  ?Procedure Laterality Date  ? BIOPSY  09/18/2018  ? Procedure: BIOPSY;  Surgeon: Danie Binder, MD;  Location: [facility];  Service: Endoscopy;;  ?  CHOLECYSTECTOMY N/A 01/07/2020  ? Procedure: LAPAROSCOPIC CHOLECYSTECTOMY;  Surgeon: Felicie Morn, MD;  Location: [facility];  Service: General;  Laterality: N/A;  ? COLONOSCOPY WITH PROPOFOL N/A 09/18/2018  ? Procedure: COLONOSCOPY WITH PROPOFOL;  Surgeon: Danie Binder, MD;  Location: [facility];  Service: Endoscopy;  Laterality: N/A;  8:30am  ? FLEXIBLE SIGMOIDOSCOPY N/A 10/05/2018  ? Procedure: FLEXIBLE SIGMOIDOSCOPY;  Surgeon: Danie Binder, MD;  Location: [facility];  Service: Endoscopy;  Laterality: N/A;  8:30  ? HEMORRHOID BANDING N/A 10/05/2018  ? Procedure: HEMORRHOID BANDING;  Surgeon: Danie Binder, MD;  Location: [facility];  Service: Endoscopy;  Laterality: N/A;  ? HERNIA REPAIR    ? INGUINAL HERNIA REPAIR Right 04/19/2017  ?  Procedure: HERNIA REPAIR INGUINAL ADULT;  Surgeon: Robert Bellow, MD;  Location: [facility] OR[facility];  Service: General;  Laterality: Right;  ? WISDOM TOOTH EXTRACTION    ? ? ?Family History  ?Problem Relation Age of Onset  ? Asthma Mother   ? Asthma Sister   ? Asthma Brother   ? Asthma Daughter   ? Diabetes Maternal Grandmother   ? Hypertension Maternal Grandmother   ? Asthma Maternal Grandmother   ? Heart disease Maternal Grandmother   ? Stroke Maternal Grandmother   ? Hypertension Maternal Grandfather   ? Asthma Maternal Grandfather   ? Heart disease Maternal Grandfather   ? Asthma Son   ? Colon cancer Neg Hx   ? Colon polyps Neg Hx   ? ? ?Social History  ? ?Tobacco Use  ? Smoking status: Never  ? Smokeless tobacco: Never  ?Vaping Use  ? Vaping Use: Never used  ?Substance Use Topics  ? Alcohol use: No  ? Drug use: No  ? ? ?ROS ? ? ?Objective:  ? ?Vitals: ?BP 106/70 (BP Location: Right Arm)   Pulse 76   Temp 99 ?F (37.2 ?C) (Oral)   Resp 16   SpO2 98%  ? ?Physical Exam ?Constitutional:   ?   General: She is not in acute distress. ?   Appearance: Normal appearance. She is well-developed and normal weight. She is not ill-appearing, toxic-appearing or diaphoretic.  ?HENT:  ?   Head: Normocephalic and atraumatic.  ?   Right Ear: Tympanic membrane, ear canal and external ear normal. No drainage or tenderness. No middle ear effusion. There is no impacted cerumen. Tympanic membrane is not erythematous.  ?   Left Ear: Tympanic membrane, ear canal and external ear normal. No drainage or tenderness.  No middle ear effusion. There is no impacted cerumen. Tympanic membrane is not erythematous.  ?   Nose: Nose normal. No congestion or rhinorrhea.  ?   Mouth/Throat:  ?   Mouth: Mucous membranes are moist. No oral lesions.  ?   Pharynx: No pharyngeal swelling, oropharyngeal exudate, posterior oropharyngeal erythema or uvula swelling.  ?   Tonsils: No tonsillar exudate or tonsillar abscesses.  ?Eyes:  ?   General: No scleral  icterus.    ?   Right eye: No discharge.     ?   Left eye: No discharge.  ?   Extraocular Movements: Extraocular movements intact.  ?   Right eye: Normal extraocular motion.  ?   Left eye: Normal extraocular motion.  ?   Conjunctiva/sclera: Conjunctivae normal.  ?Cardiovascular:  ?   Rate and Rhythm: Normal rate.  ?Pulmonary:  ?   Effort: Pulmonary effort is normal.  ?Musculoskeletal:  ?   Cervical back: Normal range  of motion and neck supple.  ?Lymphadenopathy:  ?   Cervical: No cervical adenopathy.  ?Skin: ?   General: Skin is warm and dry.  ?Neurological:  ?   General: No focal deficit present.  ?   Mental Status: She is alert and oriented to person, place, and time.  ?Psychiatric:     ?   Mood and Affect: Mood normal.     ?   Behavior: Behavior normal.     ?   Thought Content: Thought content normal.     ?   Judgment: Judgment normal.  ? ? ?DG Foot Complete Right ? ?Result Date: 04/22/2021 ?CLINICAL DATA:  Injury 2 weeks ago.  Pain and swelling EXAM: RIGHT FOOT COMPLETE - 3+ VIEW COMPARISON:  None. FINDINGS: There is no evidence of fracture or dislocation. There is no evidence of arthropathy or other focal bone abnormality. Soft tissues are unremarkable. IMPRESSION: Negative. Electronically Signed   By: Franchot Gallo M.D.   On: 04/22/2021 15:47   ? ? ?Assessment and Plan :  ? ?PDMP not reviewed this encounter. ? ?1. Foot pain, right   ?2. Eustachian tube dysfunction, left   ?3. Contusion of right foot, initial encounter   ? ?Recommended conservative management for right foot contusion.  Use RICE method, prednisone for pain and inflammation. Unremarkable ENT exam.  Will use conservative management for what I suspect is eustachian tube dysfunction.  Recommended starting Flonase, Xyzal, Sudafed. Counseled patient on potential for adverse effects with medications prescribed/recommended today, ER and return-to-clinic precautions discussed, patient verbalized understanding. ? ?  ?Jaynee Eagles, PA-C ?04/22/21 1613 ? ?

## 2021-04-27 ENCOUNTER — Ambulatory Visit: Payer: BLUE CROSS/BLUE SHIELD | Admitting: Gastroenterology

## 2021-05-11 ENCOUNTER — Ambulatory Visit: Payer: Self-pay

## 2021-05-11 DIAGNOSIS — N760 Acute vaginitis: Secondary | ICD-10-CM | POA: Diagnosis not present

## 2021-05-11 DIAGNOSIS — H6092 Unspecified otitis externa, left ear: Secondary | ICD-10-CM | POA: Diagnosis not present

## 2021-05-19 DIAGNOSIS — H5213 Myopia, bilateral: Secondary | ICD-10-CM | POA: Diagnosis not present

## 2021-05-21 ENCOUNTER — Ambulatory Visit
Admission: RE | Admit: 2021-05-21 | Discharge: 2021-05-21 | Disposition: A | Discharge status: Home / Self Care | Payer: BC Managed Care – PPO | Source: Ambulatory Visit | Attending: Student | Admitting: Student

## 2021-05-21 VITALS — BP 115/78 | HR 95 | Temp 98.9°F | Resp 18

## 2021-05-21 DIAGNOSIS — J301 Allergic rhinitis due to pollen: Secondary | ICD-10-CM

## 2021-05-21 MED ORDER — MONTELUKAST SODIUM 10 MG PO TABS: 10.0000 mg | ORAL_TABLET | Freq: Every day | ORAL | 2 refills | Status: DC

## 2021-05-21 MED ORDER — AMOXICILLIN 875 MG PO TABS: 875.0000 mg | ORAL_TABLET | Freq: Two times a day (BID) | ORAL | 0 refills | Status: AC

## 2021-05-21 MED ORDER — METHYLPREDNISOLONE SODIUM SUCC 125 MG IJ SOLR
60.0000 mg | Freq: Once | INTRAMUSCULAR | Status: AC
  Administered 2021-05-21: 60 mg via INTRAMUSCULAR

## 2021-05-21 NOTE — ED Triage Notes (Signed)
Pt states that a few days ago she started having a headache with pressure and congestion in nose ? ?Pt states she does not have any allergy meds and the nasal spray burns her nose ? ? ?Denies Fever ? ?

## 2021-05-21 NOTE — Discharge Instructions (Addendum)
-  Singulair (Montelukast) at bedtime. Take this daily for maximum efficacy. This medication can cause drowsiness.  ?-You can still take zyrtec or similar in the morning for additional relief.  ?-Amoxicillin twice daily x7 days ? ?

## 2021-05-21 NOTE — ED Provider Notes (Signed)
?[facility] ? ? ? ?CSN: [id number] ?Arrival date & time: 05/21/21  1504 ? ? ?  ? ?History   ?Chief Complaint ?Chief Complaint  ?Patient presents with  ? Nasal Congestion  ?  Entered by patient  ? ? ?HPI ?Deanna Hughes is a 31 y.o. female presenting with allergic rhinitis and increasing sinus pressure.  Allergies are currently poorly controlled on Xyzal only, states she is having nasal congestion, postnasal drip, occasional nonproductive cough.  Sinus pressure is getting worse, particularly behind her forehead.  Denies fever/chills, shortness of breath, chest pain. ? ?HPI ? ?Past Medical History:  ?Diagnosis Date  ? Anxiety   ? BV (bacterial vaginosis) 04/02/2015  ? Chlamydia   ? treated 6/6  ? Chronic back pain   ? Chronic headaches   ? MIGRAINES  ? Contraceptive management 01/25/2013  ? Depression   ? Not on meds currently  ? Encounter for Nexplanon removal 02/27/2015  ? Irregular bleeding 06/03/2013  ? Mid back pain 05/17/2018  ? Miscarriage 03/27/2013  ? Neck pain 05/17/2018  ? Nexplanon insertion 04/11/2013  ? nexplanon inserted 04/11/13 remove 3/32/18  ? Right inguinal hernia 12/14/2015  ? Right sided abdominal pain   ? Scoliosis   ? Seasonal allergies 07/03/2012  ? Sinus infection 08/27/2013  ? Supervision of normal pregnancy 08/27/2015  ?  [facility] Initiated Care at   7+5 weeks  FOB  Dating By  LMP  Pap  GC/CT Initial:                36+wks: Genetic Screen NT/IT:  CF screen  Anatomic [place]  Flu vaccine  Tdap Recommended ~ 28wks Glucose Screen  2 hr GBS  Feed Preference  Contraception  Circumcision  Childbirth Classes  Pediatrician    ? Threatened abortion in early pregnancy 03/20/2013  ? Vaginal discharge 10/03/2012  ? Yeast infection 07/03/2012  ? ? ?Patient Active Problem List  ? Diagnosis Date Noted  ? Encounter for initial prescription of contraceptive pills 10/19/2020  ? Leg swelling 07/09/2020  ? Sore throat 07/09/2020  ? Decreased libido 03/31/2020  ? Depo-Provera contraceptive status 03/31/2020  ?  Irregular bleeding 03/31/2020  ? BV (bacterial vaginosis) 03/31/2020  ? Rash and nonspecific skin eruption 10/03/2019  ? 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 Umbilical hernia 21/22/4825  ? Migraine without aura and without status migrainosus, not intractable 02/11/2019  ? Internal hemorrhoids   ? Other idiopathic scoliosis, thoracolumbar region 05/17/2018  ? ? ?Past Surgical History:  ?Procedure Laterality Date  ? BIOPSY  09/18/2018  ? Procedure: BIOPSY;  Surgeon: Danie Binder, MD;  [facility]: [facility];  Service: Endoscopy;;  ? CHOLECYSTECTOMY N/A 01/07/2020  ? Procedure: LAPAROSCOPIC CHOLECYSTECTOMY;  Surgeon: Felicie Morn, MD;  Location: [facility];  Service: General;  Laterality: N/A;  ? COLONOSCOPY WITH PROPOFOL N/A 09/18/2018  ? Procedure: COLONOSCOPY WITH PROPOFOL;  Surgeon: Danie Binder, MD;  Location: [facility];  Service: Endoscopy;  Laterality: N/A;  8:30am  ? FLEXIBLE SIGMOIDOSCOPY N/A 10/05/2018  ? Procedure: FLEXIBLE SIGMOIDOSCOPY;  Surgeon: Danie Binder, MD;  Location: [facility];  Service: Endoscopy;  Laterality: N/A;  8:30  ? HEMORRHOID BANDING N/A 10/05/2018  ? Procedure: HEMORRHOID BANDING;  Surgeon: Danie Binder, MD;  Location: [facility];  Service: Endoscopy;  Laterality: N/A;  ? HERNIA REPAIR    ? INGUINAL HERNIA REPAIR Right 04/19/2017  ? Procedure: HERNIA REPAIR INGUINAL ADULT;  Surgeon:  Robert Bellow, MD;  Location: [facility];  Service: General;  Laterality: Right;  ? WISDOM TOOTH EXTRACTION    ? ? ?OB History   ? ? Gravida  ?6  ? Para  ?4  ? Term  ?4  ? Preterm  ?0  ? AB  ?2  ? Living  ?4  ?  ? ? SAB  ?2  ? IAB  ?0  ? Ectopic  ?0  ? Multiple  ?0  ? Live Births  ?4  ?   ?  ? Obstetric Comments  ?1st Menstrual Cycle:  15  ?1st Pregnancy:  18 ?  ?  ? ?  ? ? ? ?Home Medications   ? ?Prior to Admission medications   ?Medication Sig Start Date End Date Taking? Authorizing Provider  ?amoxicillin (AMOXIL) 875 MG tablet Take 1  tablet (875 mg total) by mouth 2 (two) times daily for 7 days. 05/21/21 05/28/21 Yes Hazel Sams, PA-C  ?metroNIDAZOLE (FLAGYL) 500 MG tablet Take 1 tablet (500 mg total) by mouth 2 (two) times daily. 12/21/20   Estill Dooms, NP  ?montelukast (SINGULAIR) 10 MG tablet Take 1 tablet (10 mg total) by mouth at bedtime. 05/21/21  Yes Hazel Sams, PA-C  ?fluconazole (DIFLUCAN) 200 MG tablet Take by mouth. 03/04/21   [provider]  ?fluticasone (FLONASE) 50 MCG/ACT nasal spray Place 2 sprays into both nostrils daily. 04/22/21   Jaynee Eagles, PA-C  ?levocetirizine (XYZAL) 5 MG tablet Take 1 tablet (5 mg total) by mouth every evening. 04/22/21   Jaynee Eagles, PA-C  ?metroNIDAZOLE (METROGEL) 0.75 % vaginal gel Place vaginally at bedtime. 03/11/21   [provider]  ?nitrofurantoin, macrocrystal-monohydrate, (MACROBID) 100 MG capsule Take 100 mg by mouth every 12 (twelve) hours. 12/29/20   [provider]  ?Norethindrone-Ethinyl Estradiol-Fe Biphas (LO LOESTRIN FE) 1 MG-10 MCG / 10 MCG tablet Take 1 tablet by mouth daily. Take 1 daily by mouth 10/19/20   Estill Dooms, NP  ?tiZANidine (ZANAFLEX) 4 MG tablet Take 4 mg by mouth every 6 (six) hours as needed. 03/04/21   [provider]  ?Vitamin D, Ergocalciferol, (DRISDOL) 1.25 MG (50000 UNIT) CAPS capsule Take 50,000 Units by mouth once a week. 01/01/21   [provider]  ? ? ?Family History ?Family History  ?Problem Relation Age of Onset  ? Asthma Mother   ? Asthma Sister   ? Asthma Brother   ? Asthma Daughter   ? Diabetes Maternal Grandmother   ? Hypertension Maternal Grandmother   ? Asthma Maternal Grandmother   ? Heart disease Maternal Grandmother   ? Stroke Maternal Grandmother   ? Hypertension Maternal Grandfather   ? Asthma Maternal Grandfather   ? Heart disease Maternal Grandfather   ? Asthma Son   ? Colon cancer Neg Hx   ? Colon polyps Neg Hx   ? ? ?Social History ?Social History  ? ?Tobacco Use  ? Smoking status:  Never  ?  Passive exposure: Never  ? Smokeless tobacco: Never  ?Vaping Use  ? Vaping Use: Never used  ?Substance Use Topics  ? Alcohol use: No  ? Drug use: No  ? ? ? ?Allergies   ?Aspirin, Advil [ibuprofen], and Latex ? ? ?Review of Systems ?Review of Systems  ?Constitutional:  Negative for appetite change, chills and fever.  ?HENT:  Positive for sinus pressure. Negative for congestion, ear pain, rhinorrhea, sinus pain and sore throat.   ?Eyes:  Negative for redness and visual  disturbance.  ?Respiratory:  Negative for cough, chest tightness, shortness of breath and wheezing.   ?Cardiovascular:  Negative for chest pain and palpitations.  ?Gastrointestinal:  Negative for abdominal pain, constipation, diarrhea, nausea and vomiting.  ?Genitourinary:  Negative for dysuria, frequency and urgency.  ?Musculoskeletal:  Negative for myalgias.  ?Neurological:  Negative for dizziness, weakness and headaches.  ?Psychiatric/Behavioral:  Negative for confusion.   ?All other systems reviewed and are negative. ? ? ?Physical Exam ?Triage Vital Signs ?ED Triage Vitals  ?Enc Vitals Group  ?   BP 05/21/21 1555 115/78  ?   Pulse Rate 05/21/21 1555 95  ?   Resp 05/21/21 1555 18  ?   Temp 05/21/21 1555 98.9 ?F (37.2 ?C)  ?   Temp Source 05/21/21 1555 Oral  ?   SpO2 05/21/21 1555 99 %  ?   Weight --   ?   Height --   ?   Head Circumference --   ?   Peak Flow --   ?   Pain Score 05/21/21 1550 9  ?   Pain Loc --   ?   Pain Edu? --   ?   Excl. in [place]? --   ? ?No data found. ? ?Updated Vital Signs ?BP 115/78 (BP Location: Right Arm)   Pulse 95   Temp 98.9 ?F (37.2 ?C) (Oral)   Resp 18   LMP 05/06/2021 (Approximate)   SpO2 99%   Breastfeeding No  ? ?Visual Acuity ?Right Eye Distance:   ?Left Eye Distance:   ?Bilateral Distance:   ? ?Right Eye Near:   ?Left Eye Near:    ?Bilateral Near:    ? ?Physical Exam ?Vitals reviewed.  ?Constitutional:   ?   General: She is not in acute distress. ?   Appearance: Normal appearance. She is not  ill-appearing.  ?HENT:  ?   Head: Normocephalic and atraumatic.  ?   Right Ear: Tympanic membrane, ear canal and external ear normal. No tenderness. No middle ear effusion. There is no impacted cerumen. Tympanic Gardiner Fanti

## 2021-06-17 ENCOUNTER — Other Ambulatory Visit (HOSPITAL_COMMUNITY)
Admission: RE | Admit: 2021-06-17 | Discharge: 2021-06-17 | Disposition: A | Discharge status: Home / Self Care | Payer: BC Managed Care – PPO | Source: Ambulatory Visit | Attending: Obstetrics & Gynecology | Admitting: Obstetrics & Gynecology

## 2021-06-17 ENCOUNTER — Other Ambulatory Visit (INDEPENDENT_AMBULATORY_CARE_PROVIDER_SITE_OTHER): Payer: BC Managed Care – PPO

## 2021-06-17 DIAGNOSIS — N898 Other specified noninflammatory disorders of vagina: Secondary | ICD-10-CM | POA: Diagnosis not present

## 2021-06-17 NOTE — Progress Notes (Signed)
   NURSE VISIT- VAGINITIS  SUBJECTIVE:  Deanna Hughes is a 31 y.o. [id number] GYN patientfemale here for a vaginal swab for vaginitis screening.  She reports the following symptoms: odor and vulvar itching for 1 week. Denies abnormal vaginal bleeding, significant pelvic pain, fever, or UTI symptoms.  OBJECTIVE:  There were no vitals taken for this visit.  Appears well, in no apparent distress  ASSESSMENT: Vaginal swab for vaginitis screening  PLAN: Self-collected vaginal probe for Bacterial Vaginosis, Yeast sent to lab Treatment: to be determined once results are received Follow-up as needed if symptoms persist/worsen, or new symptoms develop  Edinson Domeier A Claudene Gatliff  06/17/2021 11:36 AM

## 2021-06-18 LAB — CERVICOVAGINAL ANCILLARY ONLY
Bacterial Vaginitis (gardnerella): NEGATIVE
Candida Glabrata: NEGATIVE
Candida Vaginitis: NEGATIVE
Comment: NEGATIVE
Comment: NEGATIVE
Comment: NEGATIVE
Comment: NEGATIVE
Trichomonas: NEGATIVE

## 2021-07-08 DIAGNOSIS — Z0001 Encounter for general adult medical examination with abnormal findings: Secondary | ICD-10-CM | POA: Diagnosis not present

## 2021-07-08 DIAGNOSIS — Z79899 Other long term (current) drug therapy: Secondary | ICD-10-CM | POA: Diagnosis not present

## 2021-08-03 ENCOUNTER — Encounter: Payer: BC Managed Care – PPO | Admitting: Adult Health

## 2021-08-03 DIAGNOSIS — E669 Obesity, unspecified: Secondary | ICD-10-CM | POA: Insufficient documentation

## 2021-08-03 DIAGNOSIS — M419 Scoliosis, unspecified: Secondary | ICD-10-CM | POA: Diagnosis not present

## 2021-08-03 DIAGNOSIS — E559 Vitamin D deficiency, unspecified: Secondary | ICD-10-CM | POA: Diagnosis not present

## 2021-08-03 DIAGNOSIS — H9202 Otalgia, left ear: Secondary | ICD-10-CM | POA: Diagnosis not present

## 2021-08-03 DIAGNOSIS — J019 Acute sinusitis, unspecified: Secondary | ICD-10-CM | POA: Diagnosis not present

## 2021-08-09 DIAGNOSIS — M9901 Segmental and somatic dysfunction of cervical region: Secondary | ICD-10-CM | POA: Diagnosis not present

## 2021-08-09 DIAGNOSIS — M542 Cervicalgia: Secondary | ICD-10-CM | POA: Diagnosis not present

## 2021-08-09 DIAGNOSIS — M4124 Other idiopathic scoliosis, thoracic region: Secondary | ICD-10-CM | POA: Diagnosis not present

## 2021-08-09 DIAGNOSIS — M9902 Segmental and somatic dysfunction of thoracic region: Secondary | ICD-10-CM | POA: Diagnosis not present

## 2021-08-12 ENCOUNTER — Encounter: Payer: BC Managed Care – PPO | Admitting: Obstetrics & Gynecology

## 2021-09-01 ENCOUNTER — Other Ambulatory Visit (HOSPITAL_COMMUNITY): Payer: Self-pay | Admitting: Nurse Practitioner

## 2021-09-01 ENCOUNTER — Other Ambulatory Visit: Payer: Self-pay | Admitting: Nurse Practitioner

## 2021-09-01 DIAGNOSIS — R5383 Other fatigue: Secondary | ICD-10-CM | POA: Diagnosis not present

## 2021-09-01 DIAGNOSIS — R109 Unspecified abdominal pain: Secondary | ICD-10-CM

## 2021-09-01 DIAGNOSIS — N390 Urinary tract infection, site not specified: Secondary | ICD-10-CM | POA: Diagnosis not present

## 2021-09-01 DIAGNOSIS — R3915 Urgency of urination: Secondary | ICD-10-CM | POA: Diagnosis not present

## 2021-09-01 DIAGNOSIS — R630 Anorexia: Secondary | ICD-10-CM | POA: Diagnosis not present

## 2021-09-01 DIAGNOSIS — M79605 Pain in left leg: Secondary | ICD-10-CM | POA: Diagnosis not present

## 2021-09-01 DIAGNOSIS — M79604 Pain in right leg: Secondary | ICD-10-CM | POA: Diagnosis not present

## 2021-09-09 ENCOUNTER — Ambulatory Visit (HOSPITAL_COMMUNITY)
Admission: RE | Admit: 2021-09-09 | Discharge: 2021-09-09 | Disposition: A | Discharge status: Home / Self Care | Payer: BC Managed Care – PPO | Source: Ambulatory Visit | Attending: Nurse Practitioner | Admitting: Nurse Practitioner

## 2021-09-09 DIAGNOSIS — R109 Unspecified abdominal pain: Secondary | ICD-10-CM | POA: Diagnosis not present

## 2021-09-13 ENCOUNTER — Ambulatory Visit (INDEPENDENT_AMBULATORY_CARE_PROVIDER_SITE_OTHER): Payer: BC Managed Care – PPO | Admitting: Obstetrics & Gynecology

## 2021-09-13 ENCOUNTER — Other Ambulatory Visit (HOSPITAL_COMMUNITY)
Admission: RE | Admit: 2021-09-13 | Discharge: 2021-09-13 | Disposition: A | Discharge status: Home / Self Care | Payer: BC Managed Care – PPO | Source: Ambulatory Visit | Attending: Adult Health | Admitting: Adult Health

## 2021-09-13 ENCOUNTER — Encounter: Payer: Self-pay | Admitting: Obstetrics & Gynecology

## 2021-09-13 VITALS — BP 110/74 | HR 80

## 2021-09-13 DIAGNOSIS — Z3202 Encounter for pregnancy test, result negative: Secondary | ICD-10-CM

## 2021-09-13 DIAGNOSIS — N898 Other specified noninflammatory disorders of vagina: Secondary | ICD-10-CM

## 2021-09-13 DIAGNOSIS — Z30017 Encounter for initial prescription of implantable subdermal contraceptive: Secondary | ICD-10-CM | POA: Diagnosis not present

## 2021-09-13 DIAGNOSIS — B3731 Acute candidiasis of vulva and vagina: Secondary | ICD-10-CM | POA: Diagnosis not present

## 2021-09-13 LAB — POCT URINE PREGNANCY: Preg Test, Ur: NEGATIVE

## 2021-09-13 MED ORDER — ETONOGESTREL 68 MG ~~LOC~~ IMPL
68.0000 mg | DRUG_IMPLANT | Freq: Once | SUBCUTANEOUS | Status: AC
Start: 2021-09-13 — End: 2021-09-13
  Administered 2021-09-13: 68 mg via SUBCUTANEOUS

## 2021-09-13 NOTE — Progress Notes (Signed)
GYN VISIT Patient name: Deanna Hughes MRN [id number]  Date of birth: [birth year] Chief Complaint:   Contraception  History of Present Illness:   Deanna Hughes is a 31 y.o. [phone] female being seen today for the following concerns:     Contraception: Desires Nexplanon- this will be her 3rd.  Tried [place], but due to irregular bleeding and [place].  Notes h/o irregular bleeding- last period in July  Notes vaginal discharge- white and notes a "yeasty" smell does not feel a strong odor.  +itching. Concern for yeast infection, currently being treated for UTI with macrobid.  No IC in the past 5 mos.  Patient's last menstrual period was 08/14/2021.     10/14/2020    3:05 PM 07/09/2020    2:57 PM 10/03/2019   10:59 AM 02/06/2019    3:09 PM 12/31/2018   10:42 AM  Depression screen PHQ 2/9  Decreased Interest 3 0 0 0 1  Down, Depressed, Hopeless 1 0 0 0 1  PHQ - 2 Score 4 0 0 0 2  Altered sleeping 0  0  0  Tired, decreased energy 2  0  1  Change in appetite 0  0  2  Feeling bad or failure about yourself  1  0  0  Trouble concentrating 0  0  0  Moving slowly or fidgety/restless 0  0  0  Suicidal thoughts 0  0  0  PHQ-9 Score 7  0  5  Difficult doing work/chores   Not difficult at all       Review of Systems:   Pertinent items are noted in HPI Denies fever/chills, dizziness, headaches, visual disturbances, fatigue, shortness of breath, chest pain, abdominal pain, vomiting, no problems with periods, bowel movements, urination, or intercourse unless otherwise stated above.  Pertinent History Reviewed:  Reviewed past medical,surgical, social, obstetrical and family history.  Reviewed problem list, medications and allergies. Physical Assessment:   Vitals:   09/13/21 1110  BP: 110/74  Pulse: 80  There is no height or weight on file to calculate BMI.       Physical Examination:   General appearance: alert, well appearing, and in no distress  Psych:  mood appropriate, normal affect  Skin: warm & dry   Cardiovascular: normal heart rate noted  Respiratory: normal respiratory effort, no distress  Abdomen: soft, non-tender   Pelvic: VULVA: normal appearing vulva with no masses, tenderness or lesions, VAGINA: normal appearing vagina with normal color, scant white discharge noted  Extremities: left arm with scar from prior Nexplanon  Chaperone:  Dr. Wyonia Hough INSERTION  Risks/benefits/side effects of Nexplanon have been discussed and her questions have been answered.  Specifically, a failure rate of 01/998 has been reported, with an increased failure rate if pt takes [place] and/or antiseizure medicaitons.  She is aware of the common side effect of irregular bleeding, which the incidence of decreases over time. Signed copy of informed consent in chart.   Time out was performed.  She is right-handed, so her left arm, approximately 10cm from the medial epicondyle and 3-5cm posterior to the sulcus, was cleansed with alcohol and anesthetized with 2cc of 2% Lidocaine.  The area was cleansed again with betadine and the Nexplanon was inserted per manufacturer's recommendations without difficulty.  3 steri-strips and pressure bandage were applied. The patient tolerated the procedure well.  Assessment & Plan:   1) Nexplanon insertion Pt was  instructed to keep the area clean and dry, remove pressure bandage in 24 hours, and keep insertion site covered with the steri-strip for 3-5 days.  Back up contraception was recommended for 2 weeks.  She was given a card indicating date Nexplanon was inserted and date it needs to be removed. Follow-up PRN problems.  2) Vaginal discharge -vaginitis panel obtained, further management pending results  Orders Placed This Encounter  Procedures   POCT urine pregnancy    Return in about 1 year (around 09/14/2022) for Annual.   Janyth Pupa, DO Attending [place], [facility] [place], [place]

## 2021-09-14 ENCOUNTER — Other Ambulatory Visit: Payer: Self-pay | Admitting: Obstetrics & Gynecology

## 2021-09-14 DIAGNOSIS — N898 Other specified noninflammatory disorders of vagina: Secondary | ICD-10-CM

## 2021-09-14 LAB — CERVICOVAGINAL ANCILLARY ONLY
Bacterial Vaginitis (gardnerella): NEGATIVE
Candida Glabrata: NEGATIVE
Candida Vaginitis: POSITIVE — AB
Comment: NEGATIVE
Comment: NEGATIVE
Comment: NEGATIVE

## 2021-09-14 MED ORDER — FLUCONAZOLE 150 MG PO TABS: ORAL_TABLET | ORAL | 0 refills | Status: DC

## 2021-09-14 NOTE — Progress Notes (Signed)
Rx for yeast sent in

## 2021-11-02 ENCOUNTER — Other Ambulatory Visit (INDEPENDENT_AMBULATORY_CARE_PROVIDER_SITE_OTHER): Payer: BC Managed Care – PPO | Admitting: *Deleted

## 2021-11-02 ENCOUNTER — Other Ambulatory Visit (HOSPITAL_COMMUNITY)
Admission: RE | Admit: 2021-11-02 | Discharge: 2021-11-02 | Disposition: A | Discharge status: Home / Self Care | Payer: BC Managed Care – PPO | Source: Ambulatory Visit | Attending: Obstetrics & Gynecology | Admitting: Obstetrics & Gynecology

## 2021-11-02 DIAGNOSIS — N898 Other specified noninflammatory disorders of vagina: Secondary | ICD-10-CM

## 2021-11-02 DIAGNOSIS — R350 Frequency of micturition: Secondary | ICD-10-CM | POA: Diagnosis not present

## 2021-11-02 DIAGNOSIS — Z113 Encounter for screening for infections with a predominantly sexual mode of transmission: Secondary | ICD-10-CM | POA: Diagnosis not present

## 2021-11-02 LAB — POCT URINALYSIS DIPSTICK OB
Bilirubin, UA: NEGATIVE
Blood, UA: NEGATIVE
Glucose, UA: NEGATIVE
Ketones, UA: NEGATIVE
Leukocytes, UA: NEGATIVE
Nitrite, UA: NEGATIVE
POC,PROTEIN,UA: NEGATIVE
Spec Grav, UA: >=1.03 — AB (ref 1.010–1.025)
Urobilinogen, UA: 1 E.U./dL
pH, UA: 6 (ref 5.0–8.0)

## 2021-11-02 NOTE — Progress Notes (Signed)
   NURSE VISIT- VAGINITIS/STD/POC  SUBJECTIVE:  Deanna Hughes is a 31 y.o. [id number] GYN patientfemale here for a vaginal swab for vaginitis screening.  She reports the following symptoms: odor and vulvar itching for several days. Denies abnormal vaginal bleeding, significant pelvic pain, fever, or UTI symptoms.  OBJECTIVE:  There were no vitals taken for this visit.  Appears well, in no apparent distress  ASSESSMENT: Vaginal swab for vaginitis screening  PLAN: Self-collected vaginal probe for Bacterial Vaginosis, Yeast sent to lab Treatment: to be determined once results are received Follow-up as needed if symptoms persist/worsen, or new symptoms develop  Also sent urine for culture per patient request.   [name]  11/02/2021 4:30 PM

## 2021-11-03 DIAGNOSIS — R350 Frequency of micturition: Secondary | ICD-10-CM | POA: Diagnosis not present

## 2021-11-04 LAB — URINALYSIS
Bilirubin, UA: NEGATIVE
Glucose, UA: NEGATIVE
Ketones, UA: NEGATIVE
Leukocytes,UA: NEGATIVE
Nitrite, UA: NEGATIVE
RBC, UA: NEGATIVE
Specific Gravity, UA: >=1.03 — AB (ref 1.005–1.030)
Urobilinogen, Ur: 1 mg/dL (ref 0.2–1.0)
pH, UA: 6 (ref 5.0–7.5)

## 2021-11-04 LAB — CERVICOVAGINAL ANCILLARY ONLY
Bacterial Vaginitis (gardnerella): NEGATIVE
Candida Glabrata: NEGATIVE
Candida Vaginitis: NEGATIVE
Comment: NEGATIVE
Comment: NEGATIVE
Comment: NEGATIVE

## 2021-11-05 LAB — URINE CULTURE

## 2021-11-08 DIAGNOSIS — R6882 Decreased libido: Secondary | ICD-10-CM | POA: Diagnosis not present

## 2021-11-08 DIAGNOSIS — N951 Menopausal and female climacteric states: Secondary | ICD-10-CM | POA: Diagnosis not present

## 2021-11-08 DIAGNOSIS — R1084 Generalized abdominal pain: Secondary | ICD-10-CM | POA: Diagnosis not present

## 2021-11-08 DIAGNOSIS — Z6832 Body mass index (BMI) 32.0-32.9, adult: Secondary | ICD-10-CM | POA: Diagnosis not present

## 2021-12-24 ENCOUNTER — Ambulatory Visit
Admission: RE | Admit: 2021-12-24 | Discharge: 2021-12-24 | Disposition: A | Discharge status: Home / Self Care | Payer: BC Managed Care – PPO | Source: Ambulatory Visit | Attending: Family Medicine | Admitting: Family Medicine

## 2021-12-24 ENCOUNTER — Other Ambulatory Visit: Payer: Self-pay

## 2021-12-24 VITALS — BP 113/78 | HR 84 | Temp 98.8°F | Resp 20

## 2021-12-24 DIAGNOSIS — R519 Headache, unspecified: Secondary | ICD-10-CM | POA: Insufficient documentation

## 2021-12-24 DIAGNOSIS — J029 Acute pharyngitis, unspecified: Secondary | ICD-10-CM | POA: Diagnosis not present

## 2021-12-24 DIAGNOSIS — N898 Other specified noninflammatory disorders of vagina: Secondary | ICD-10-CM | POA: Insufficient documentation

## 2021-12-24 DIAGNOSIS — J358 Other chronic diseases of tonsils and adenoids: Secondary | ICD-10-CM | POA: Insufficient documentation

## 2021-12-24 LAB — POCT RAPID STREP A (OFFICE): Rapid Strep A Screen: NEGATIVE

## 2021-12-24 MED ORDER — FLUCONAZOLE 150 MG PO TABS: 150.0000 mg | ORAL_TABLET | Freq: Once | ORAL | 0 refills | Status: AC

## 2021-12-24 MED ORDER — AMOXICILLIN 500 MG PO CAPS: 500.0000 mg | ORAL_CAPSULE | Freq: Two times a day (BID) | ORAL | 0 refills | Status: AC

## 2021-12-24 NOTE — Discharge Instructions (Addendum)
Rapid strep throat test is negative today.  Throat culture is pending.  Will call you Monday if it comes back positive for strep throat.  In the meantime, please start the amoxicillin and take it twice daily for 10 days.  I am concerned you have strep throat because you have exudate on your tonsils.    Start warm liquids with lemon/honey, saline gargling, over-the-counter lozenges to help with throat pain.  Change your toothbrush 24 to 48 hours after starting the antibiotic.  You can take the fluconazole at the end of treatment if needed for a vaginal yeast infection.

## 2021-12-24 NOTE — ED Provider Notes (Signed)
[facility]    CSN: [id number] Arrival date & time: 12/24/21  1312      History   Chief Complaint Chief Complaint  Patient presents with   Sore Throat    Entered by patient    HPI Deanna Hughes is a 31 y.o. female.   Patient presents today for about 12 hours of right-sided sore throat.  Also endorses a little bit of nasal congestion and postnasal drainage.  Feels her glands are swollen on the outside of her left jaw and she also has a headache.  Patient denies fever, bodyaches or chills, cough, shortness of breath or chest pain, runny nose, ear pain or drainage, abdominal pain, nausea/vomiting, diarrhea, decreased appetite.  Reports she is more tired than normal today.  Has not taken any for symptoms so far.  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    Past Medical History:  Diagnosis Date   Anxiety    BV (bacterial vaginosis) 04/02/2015   Chlamydia    treated 6/6   Chronic back pain    Chronic headaches    MIGRAINES   Contraceptive management 01/25/2013   Depression    Not on meds currently   Encounter for Nexplanon removal 02/27/2015   Irregular bleeding 06/03/2013   Mid back pain 05/17/2018   Miscarriage 03/27/2013   Neck pain 05/17/2018   Nexplanon insertion 04/11/2013   nexplanon inserted 04/11/13 remove 3/32/18   Right inguinal hernia 12/14/2015   Right sided abdominal pain    Scoliosis    Seasonal allergies 07/03/2012   Sinus infection 08/27/2013   Supervision of normal pregnancy 08/27/2015    [facility] at   7+5 weeks  FOB  Dating By  LMP  Pap  GC/CT Initial:                36+wks: Genetic Screen NT/IT:  CF screen  Anatomic [place]  Flu vaccine  Tdap Recommended ~ 28wks Glucose Screen  2 hr GBS  Feed Preference  Contraception  Circumcision  Childbirth Classes  Pediatrician     Threatened abortion in early pregnancy 03/20/2013   Vaginal discharge  10/03/2012   Yeast infection 07/03/2012    Patient Active Problem List   Diagnosis Date Noted   Encounter for initial prescription of contraceptive pills 10/19/2020   Leg swelling 07/09/2020   Sore throat 07/09/2020   Decreased libido 03/31/2020   Depo-Provera contraceptive status 03/31/2020   Irregular bleeding 03/31/2020   BV (bacterial vaginosis) 03/31/2020   Rash and nonspecific skin eruption 10/03/2019   Intractable chronic migraine without aura and without status migrainosus 24/26/8341   Umbilical hernia 96/22/2979   Migraine without aura and without status migrainosus, not intractable 02/11/2019   Internal hemorrhoids    Other idiopathic scoliosis, thoracolumbar region 05/17/2018    Past Surgical History:  Procedure Laterality Date   BIOPSY  09/18/2018   Procedure: BIOPSY;  Surgeon: Danie Binder, MD;  [facility];  Service: Endoscopy;;   CHOLECYSTECTOMY N/A 01/07/2020   Procedure: LAPAROSCOPIC CHOLECYSTECTOMY;  Surgeon: Felicie Morn, MD;  Location: [facility];  Service: General;  Laterality: N/A;   COLONOSCOPY WITH PROPOFOL N/A 09/18/2018   Procedure: COLONOSCOPY WITH PROPOFOL;  Surgeon: Danie Binder, MD;  [facility]: [facility];  Service: Endoscopy;  Laterality: N/A;  8:30am   FLEXIBLE SIGMOIDOSCOPY N/A 10/05/2018   Procedure: FLEXIBLE SIGMOIDOSCOPY;  Surgeon:  Fields, Marga Melnick, MD;  [facility];  Service: Endoscopy;  Laterality: N/A;  8:30   HEMORRHOID BANDING N/A 10/05/2018   Procedure: Thayer Jew;  Surgeon: Danie Binder, MD;  [facility];  Service: Endoscopy;  Laterality: N/A;   HERNIA REPAIR     INGUINAL HERNIA REPAIR Right 04/19/2017   Procedure: HERNIA REPAIR INGUINAL ADULT;  Surgeon: Robert Bellow, MD;  Location: [facility];  Service: General;  Laterality: Right;   WISDOM TOOTH EXTRACTION      OB History     Gravida  6   Para  4   Term  4   Preterm  0   AB  2   Living  4      SAB  2   IAB  0    Ectopic  0   Multiple  0   Live Births  4        Obstetric Comments  1st Menstrual Cycle:  15  1st Pregnancy:  18           Home Medications    Prior to Admission medications   Medication Sig Start Date End Date Taking? Authorizing Provider  amoxicillin (AMOXIL) 500 MG capsule Take 1 capsule (500 mg total) by mouth 2 (two) times daily for 10 days. 12/24/21 01/03/22 Yes Eulogio Bear, NP  fluconazole (DIFLUCAN) 150 MG tablet Take 1 tablet (150 mg total) by mouth once for 1 dose. Take at completion of antibiotic 12/24/21 12/24/21  Eulogio Bear, NP  Vitamin D, Ergocalciferol, (DRISDOL) 1.25 MG (50000 UNIT) CAPS capsule Take 50,000 Units by mouth once a week. 01/01/21   [provider]    Family History Family History  Problem Relation Age of Onset   Asthma Mother    Asthma Sister    Asthma Brother    Asthma Daughter    Diabetes Maternal Grandmother    Hypertension Maternal Grandmother    Asthma Maternal Grandmother    Heart disease Maternal Grandmother    Stroke Maternal Grandmother    Hypertension Maternal Grandfather    Asthma Maternal Grandfather    Heart disease Maternal Grandfather    Asthma Son    Colon cancer Neg Hx    Colon polyps Neg Hx     Social History Social History   Tobacco Use   Smoking status: Never    Passive exposure: Never   Smokeless tobacco: Never  Vaping Use   Vaping Use: Never used  Substance Use Topics   Alcohol use: No   Drug use: No     Allergies   Aspirin, Advil [ibuprofen], and Latex   Review of Systems Review of Systems Per HPI  Physical Exam Triage Vital Signs ED Triage Vitals  Enc Vitals Group     BP 12/24/21 1353 113/78     Pulse Rate 12/24/21 1353 84     Resp 12/24/21 1353 20     Temp 12/24/21 1353 98.8 F (37.1 C)     Temp Source 12/24/21 1353 Oral     SpO2 12/24/21 1353 99 %     Weight --      Height --      Head Circumference --      Peak Flow --      Pain Score 12/24/21 1351 6      Pain Loc --      Pain Edu? --      Excl. in [place]? --    No data found.  Updated Vital Signs BP 113/78 (BP Location: Right Arm)   Pulse 84   Temp 98.8 F (37.1 C) (Oral)   Resp 20   LMP 12/17/2021 (Approximate)   SpO2 99%   Visual Acuity Right Eye Distance:   Left Eye Distance:   Bilateral Distance:    Right Eye Near:   Left Eye Near:    Bilateral Near:     Physical Exam Vitals and nursing note reviewed.  Constitutional:      General: She is not in acute distress.    Appearance: She is well-developed. She is not toxic-appearing.  HENT:     Head: Normocephalic and atraumatic.     Right Ear: Tympanic membrane and ear canal normal. No drainage, swelling or tenderness. No middle ear effusion. Tympanic membrane is not erythematous.     Left Ear: Tympanic membrane and ear canal normal. No drainage, swelling or tenderness.  No middle ear effusion. Tympanic membrane is not erythematous.     Nose: No congestion or rhinorrhea.     Mouth/Throat:     Mouth: Mucous membranes are moist.     Pharynx: Oropharynx is clear. Uvula midline. Posterior oropharyngeal erythema present. No oropharyngeal exudate.     Tonsils: Tonsillar exudate present. 2+ on the right. 2+ on the left.  Eyes:     Extraocular Movements:     Right eye: Normal extraocular motion.     Left eye: Normal extraocular motion.  Cardiovascular:     Rate and Rhythm: Normal rate and regular rhythm.  Pulmonary:     Effort: Pulmonary effort is normal. No respiratory distress.     Breath sounds: Normal breath sounds. No wheezing, rhonchi or rales.  Musculoskeletal:     Cervical back: Normal range of motion and neck supple.  Lymphadenopathy:     Cervical: No cervical adenopathy.  Skin:    General: Skin is warm and dry.     Capillary Refill: Capillary refill takes less than 2 seconds.     Coloration: Skin is not pale.     Findings: No erythema or rash.  Neurological:     Mental Status: She is alert and oriented to person,  place, and time.  Psychiatric:        Behavior: Behavior is cooperative.      UC Treatments / Results  Labs (all labs ordered are listed, but only abnormal results are displayed) Labs Reviewed  CULTURE, GROUP A STREP [facility])  POCT RAPID STREP A (OFFICE)    EKG   Radiology No results found.  Procedures Procedures (including critical care time)  Medications Ordered in UC Medications - No data to display  Initial Impression / Assessment and Plan / UC Course  I have reviewed the triage vital signs and the nursing notes.  Pertinent labs & imaging results that were available during my care of the patient were reviewed by me and considered in my medical decision making (see chart for details).   Patient is well-appearing, normotensive, afebrile, not tachycardic, not tachypneic, oxygenating well on room air.    Acute pharyngitis, unspecified etiology Acute nonintractable headache, unspecified headache type Tonsillar exudate Given examination and history, I am concerned for strep throat.  Rapid strep test is negative, throat culture is pending.  In meantime, treat with amoxicillin twice daily for 10 days Change toothbrush after starting treatment Other supportive care discussed At the end of treatment, may take 1 dose of fluconazole if yeast infection symptoms develop   The patient was given the opportunity to  ask questions.  All questions answered to their satisfaction.  The patient is in agreement to this plan.    Final Clinical Impressions(s) / UC Diagnoses   Final diagnoses:  Acute pharyngitis, unspecified etiology  Acute nonintractable headache, unspecified headache type  Tonsillar exudate     Discharge Instructions      Rapid strep throat test is negative today.  Throat culture is pending.  Will call you Monday if it comes back positive for strep throat.  In the meantime, please start the amoxicillin and take it twice daily for 10 days.  I am concerned you have  strep throat because you have exudate on your tonsils.    Start warm liquids with lemon/honey, saline gargling, over-the-counter lozenges to help with throat pain.  Change your toothbrush 24 to 48 hours after starting the antibiotic.  You can take the fluconazole at the end of treatment if needed for a vaginal yeast infection.    ED Prescriptions     Medication Sig Dispense Auth. Provider   fluconazole (DIFLUCAN) 150 MG tablet Take 1 tablet (150 mg total) by mouth once for 1 dose. Take at completion of antibiotic 1 tablet Noemi Chapel A, NP   amoxicillin (AMOXIL) 500 MG capsule Take 1 capsule (500 mg total) by mouth 2 (two) times daily for 10 days. 20 capsule Eulogio Bear, NP      PDMP not reviewed this encounter.   Eulogio Bear, NP 12/24/21 1654

## 2021-12-24 NOTE — ED Triage Notes (Signed)
Pt reports right sided throat pain since last night. Pt inquiring about strep test.

## 2021-12-26 LAB — CULTURE, GROUP A STREP (THRC)

## 2022-02-07 ENCOUNTER — Other Ambulatory Visit (HOSPITAL_COMMUNITY)
Admission: RE | Admit: 2022-02-07 | Discharge: 2022-02-07 | Disposition: A | Discharge status: Home / Self Care | Payer: BC Managed Care – PPO | Source: Ambulatory Visit | Attending: Obstetrics & Gynecology | Admitting: Obstetrics & Gynecology

## 2022-02-07 ENCOUNTER — Other Ambulatory Visit (INDEPENDENT_AMBULATORY_CARE_PROVIDER_SITE_OTHER): Payer: BC Managed Care – PPO | Admitting: *Deleted

## 2022-02-07 DIAGNOSIS — N898 Other specified noninflammatory disorders of vagina: Secondary | ICD-10-CM

## 2022-02-07 NOTE — Progress Notes (Signed)
   NURSE VISIT- VAGINITIS/STD  SUBJECTIVE:  Deanna Hughes is a 32 y.o. [id number] GYN patientfemale here for a vaginal swab for vaginitis screening, STD screen.  She reports the following symptoms:  vaginal itching, discharge and odor  for 1-2 weeks. Denies abnormal vaginal bleeding, significant pelvic pain, fever, or UTI symptoms.  OBJECTIVE:  There were no vitals taken for this visit.  Appears well, in no apparent distress  ASSESSMENT: Vaginal swab for vaginitis screening & STD screening.  PLAN: Self-collected vaginal probe for Gonorrhea, Chlamydia, Trichomonas, Bacterial Vaginosis, Yeast sent to lab Treatment: to be determined once results are received. Pt requests Flagyl 500 mg tablet if +BV. Follow-up as needed if symptoms persist/worsen, or new symptoms develop  Levy Pupa  02/07/2022 10:16 AM

## 2022-02-08 LAB — CERVICOVAGINAL ANCILLARY ONLY
Bacterial Vaginitis (gardnerella): NEGATIVE
Candida Glabrata: NEGATIVE
Candida Vaginitis: NEGATIVE
Chlamydia: NEGATIVE
Comment: NEGATIVE
Comment: NEGATIVE
Comment: NEGATIVE
Comment: NEGATIVE
Comment: NEGATIVE
Comment: NORMAL
Neisseria Gonorrhea: NEGATIVE
Trichomonas: NEGATIVE

## 2022-02-23 ENCOUNTER — Ambulatory Visit
Admission: RE | Admit: 2022-02-23 | Discharge: 2022-02-23 | Disposition: A | Discharge status: Home / Self Care | Payer: BC Managed Care – PPO | Source: Ambulatory Visit | Attending: Nurse Practitioner | Admitting: Nurse Practitioner

## 2022-02-23 ENCOUNTER — Other Ambulatory Visit: Payer: Self-pay

## 2022-02-23 ENCOUNTER — Ambulatory Visit (INDEPENDENT_AMBULATORY_CARE_PROVIDER_SITE_OTHER): Payer: BC Managed Care – PPO

## 2022-02-23 VITALS — BP 120/73 | HR 79 | Temp 98.4°F | Resp 20

## 2022-02-23 DIAGNOSIS — M25512 Pain in left shoulder: Secondary | ICD-10-CM | POA: Diagnosis not present

## 2022-02-23 DIAGNOSIS — S46912A Strain of unspecified muscle, fascia and tendon at shoulder and upper arm level, left arm, initial encounter: Secondary | ICD-10-CM

## 2022-02-23 MED ORDER — TIZANIDINE HCL 4 MG PO TABS: 4.0000 mg | ORAL_TABLET | Freq: Three times a day (TID) | ORAL | 0 refills | Status: DC | PRN

## 2022-02-23 NOTE — Discharge Instructions (Signed)
The x-ray today does not show any broken bones in your shoulder.  I suspect the muscles are inflamed from the impact from the door.  Continue Tylenol 500 to 1000 mg every 6 hours as needed for pain.  You can also take the tizanidine every 8 hours or at nighttime as needed for muscle pain.  I would also recommend using ice 15 minutes on, 45 minutes off every hour while awake to help with inflammation.  Seek care if symptoms persist or worsen despite treatment

## 2022-02-23 NOTE — ED Triage Notes (Addendum)
Pt reports left shoulder pain ever since moving a door off a pallet at work yesterday and reports door slid back and hit left clavicle and shoulder. Pt denies pain at time of injury but reports increased soreness, numbness/tingling, hand swelling, left arm tenderness today.   Radial pulse strong.Grip intact. No obvious deformity noted. Limited ROM of left shoulder.  Pt denies being workman's comp claim at this time.

## 2022-02-23 NOTE — ED Provider Notes (Signed)
[facility]    CSN: [id number] Arrival date & time: 02/23/22  1557      History   Chief Complaint Chief Complaint  Patient presents with   Shoulder Injury    Entered by patient    HPI Deanna Hughes is a 32 y.o. female.   Patient presents today with left shoulder pain.  Reports that at work yesterday, she was lifting a door off a pallet and the door slid off the pallet and came into contact with her left shoulder.  She denies pain immediately after, however woke up this morning and the shoulder was in pain.  She also endorses redness and swelling, hand swelling, and numbness/tingling in the fingertips.  No bruising.  No decreased range of motion.  Reports it hurts to move her arm.  No swelling in the arm.  Is been taking Tylenol for the pain without much benefit.    Past Medical History:  Diagnosis Date   Anxiety    BV (bacterial vaginosis) 04/02/2015   Chlamydia    treated 6/6   Chronic back pain    Chronic headaches    MIGRAINES   Contraceptive management 01/25/2013   Depression    Not on meds currently   Encounter for Nexplanon removal 02/27/2015   Irregular bleeding 06/03/2013   Mid back pain 05/17/2018   Miscarriage 03/27/2013   Neck pain 05/17/2018   Nexplanon insertion 04/11/2013   nexplanon inserted 04/11/13 remove 3/32/18   Right inguinal hernia 12/14/2015   Right sided abdominal pain    Scoliosis    Seasonal allergies 07/03/2012   Sinus infection 08/27/2013   Supervision of normal pregnancy 08/27/2015    [facility] at   7+5 weeks  FOB  Dating By  LMP  Pap  GC/CT Initial:                36+wks: Genetic Screen NT/IT:  CF screen  Anatomic [place]  Flu vaccine  Tdap Recommended ~ 28wks Glucose Screen  2 hr GBS  Feed Preference  Contraception  Circumcision  Childbirth Classes  Pediatrician     Threatened abortion in early pregnancy 03/20/2013   Vaginal discharge 10/03/2012   Yeast infection 07/03/2012    Patient Active Problem List    Diagnosis Date Noted   Encounter for initial prescription of contraceptive pills 10/19/2020   Leg swelling 07/09/2020   Sore throat 07/09/2020   Decreased libido 03/31/2020   Depo-Provera contraceptive status 03/31/2020   Irregular bleeding 03/31/2020   BV (bacterial vaginosis) 03/31/2020   Rash and nonspecific skin eruption 10/03/2019   Intractable chronic migraine without aura and without status migrainosus 00/86/7619   Umbilical hernia 50/93/2671   Migraine without aura and without status migrainosus, not intractable 02/11/2019   Internal hemorrhoids    Other idiopathic scoliosis, thoracolumbar region 05/17/2018    Past Surgical History:  Procedure Laterality Date   BIOPSY  09/18/2018   Procedure: BIOPSY;  Surgeon: Danie Binder, MD;  [facility];  Service: Endoscopy;;   CHOLECYSTECTOMY N/A 01/07/2020   Procedure: LAPAROSCOPIC CHOLECYSTECTOMY;  Surgeon: Felicie Morn, MD;  Location: [facility];  Service: General;  Laterality: N/A;   COLONOSCOPY WITH PROPOFOL N/A 09/18/2018   Procedure: COLONOSCOPY WITH PROPOFOL;  Surgeon: Danie Binder, MD;  [facility];  Service: Endoscopy;  Laterality: N/A;  8:30am   FLEXIBLE SIGMOIDOSCOPY N/A 10/05/2018   Procedure: FLEXIBLE SIGMOIDOSCOPY;  Surgeon: Danie Binder, MD;  [facility];  Service: Endoscopy;  Laterality: N/A;  8:30   HEMORRHOID BANDING N/A 10/05/2018   Procedure: Thayer Jew;  Surgeon: Danie Binder, MD;  [facility];  Service: Endoscopy;  Laterality: N/A;   HERNIA REPAIR     INGUINAL HERNIA REPAIR Right 04/19/2017   Procedure: HERNIA REPAIR INGUINAL ADULT;  Surgeon: Robert Bellow, MD;  Location: [facility];  Service: General;  Laterality: Right;   WISDOM TOOTH EXTRACTION      OB History     Gravida  6   Para  4   Term  4   Preterm  0   AB  2   Living  4      SAB  2   IAB  0   Ectopic  0   Multiple  0   Live Births  4        Obstetric Comments   1st Menstrual Cycle:  15  1st Pregnancy:  18           Home Medications    Prior to Admission medications   Medication Sig Start Date End Date Taking? Authorizing Provider  tiZANidine (ZANAFLEX) 4 MG tablet Take 1 tablet (4 mg total) by mouth every 8 (eight) hours as needed for muscle spasms. Do not take with alcohol or while driving or operating heavy machinery.  May cause drowsiness. 02/23/22  Yes Eulogio Bear, NP  Vitamin D, Ergocalciferol, (DRISDOL) 1.25 MG (50000 UNIT) CAPS capsule Take 50,000 Units by mouth once a week. 01/01/21   [provider]    Family History Family History  Problem Relation Age of Onset   Asthma Mother    Asthma Sister    Asthma Brother    Asthma Daughter    Diabetes Maternal Grandmother    Hypertension Maternal Grandmother    Asthma Maternal Grandmother    Heart disease Maternal Grandmother    Stroke Maternal Grandmother    Hypertension Maternal Grandfather    Asthma Maternal Grandfather    Heart disease Maternal Grandfather    Asthma Son    Colon cancer Neg Hx    Colon polyps Neg Hx     Social History Social History   Tobacco Use   Smoking status: Never    Passive exposure: Never   Smokeless tobacco: Never  Vaping Use   Vaping Use: Never used  Substance Use Topics   Alcohol use: No   Drug use: No     Allergies   Aspirin, Advil [ibuprofen], and Latex   Review of Systems Review of Systems Per HPI  Physical Exam Triage Vital Signs ED Triage Vitals  Enc Vitals Group     BP 02/23/22 1716 120/73     Pulse Rate 02/23/22 1716 79     Resp 02/23/22 1716 20     Temp 02/23/22 1716 98.4 F (36.9 C)     Temp Source 02/23/22 1716 Oral     SpO2 02/23/22 1716 98 %     Weight --      Height --      Head Circumference --      Peak Flow --      Pain Score 02/23/22 1714 8     Pain Loc --      Pain Edu? --      Excl. in [place]? --    No data found.  Updated Vital Signs BP 120/73 (BP Location: Right Arm)   Pulse  79   Temp 98.4 F (36.9 C) (Oral)  Resp 20   LMP 01/23/2022 Comment: irregular with implant  SpO2 98%   Visual Acuity Right Eye Distance:   Left Eye Distance:   Bilateral Distance:    Right Eye Near:   Left Eye Near:    Bilateral Near:     Physical Exam Vitals and nursing note reviewed.  Constitutional:      General: She is not in acute distress.    Appearance: Normal appearance. She is not toxic-appearing.  HENT:     Head: Normocephalic and atraumatic.     Mouth/Throat:     Mouth: Mucous membranes are moist.     Pharynx: Oropharynx is clear.  Pulmonary:     Effort: Pulmonary effort is normal. No respiratory distress.  Musculoskeletal:       Arms:     Comments: Inspection: No swelling, obvious deformity, or redness to the left upper extremity Palpation: Left shoulder diffusely tender to palpation anterior and posteriorly as well as along the lateral humeral head; no obvious deformities palpated ROM: Range of motion difficult to perform secondary to pain Strength: 5/5 bilateral upper extremities Neurovascular: neurovascularly intact in left and right upper extremity   Skin:    General: Skin is warm and dry.     Capillary Refill: Capillary refill takes less than 2 seconds.  Neurological:     Mental Status: She is alert and oriented to person, place, and time.  Psychiatric:        Behavior: Behavior is cooperative.      UC Treatments / Results  Labs (all labs ordered are listed, but only abnormal results are displayed) Labs Reviewed - No data to display  EKG   Radiology DG Shoulder Left  Result Date: 02/23/2022 CLINICAL DATA:  A 32 year old female presents with LEFT shoulder pain. EXAM: LEFT SHOULDER - 2+ VIEW COMPARISON:  None available. FINDINGS: There is no evidence of fracture or dislocation. There is no evidence of arthropathy or other focal bone abnormality. Soft tissues are unremarkable. IMPRESSION: Negative. Electronically Signed   By: Zetta Bills  M.D.   On: 02/23/2022 17:42    Procedures Procedures (including critical care time)  Medications Ordered in UC Medications - No data to display  Initial Impression / Assessment and Plan / UC Course  I have reviewed the triage vital signs and the nursing notes.  Pertinent labs & imaging results that were available during my care of the patient were reviewed by me and considered in my medical decision making (see chart for details).   Patient is well-appearing, normotensive, afebrile, not tachycardic, not tachypneic, oxygenating well on room air.    1. Strain of left shoulder, initial encounter Shoulder x-ray today does not show any acute bony abnormalities Suspect shoulder strain Supportive treatment discussed including continuing Tylenol 500 to 1000 mg every 6 hours as needed, ice/heat Start tizanidine as needed for muscular pain Encourage light range of motion/stretching exercises Note given for work ER and return precautions discussed with patient  The patient was given the opportunity to ask questions.  All questions answered to their satisfaction.  The patient is in agreement to this plan.    Final Clinical Impressions(s) / UC Diagnoses   Final diagnoses:  Strain of left shoulder, initial encounter     Discharge Instructions      The x-ray today does not show any broken bones in your shoulder.  I suspect the muscles are inflamed from the impact from the door.  Continue Tylenol 500 to 1000 mg every 6 hours  as needed for pain.  You can also take the tizanidine every 8 hours or at nighttime as needed for muscle pain.  I would also recommend using ice 15 minutes on, 45 minutes off every hour while awake to help with inflammation.  Seek care if symptoms persist or worsen despite treatment    ED Prescriptions     Medication Sig Dispense Auth. Provider   tiZANidine (ZANAFLEX) 4 MG tablet Take 1 tablet (4 mg total) by mouth every 8 (eight) hours as needed for muscle spasms. Do  not take with alcohol or while driving or operating heavy machinery.  May cause drowsiness. 30 tablet Eulogio Bear, NP      PDMP not reviewed this encounter.   Eulogio Bear, NP 02/23/22 1755

## 2022-03-02 ENCOUNTER — Other Ambulatory Visit: Payer: Self-pay | Admitting: Nurse Practitioner

## 2022-03-02 NOTE — Telephone Encounter (Signed)
Unable to refill per protocol, last refill by another provider not at this practice.  Requested Prescriptions  Pending Prescriptions Disp Refills   tiZANidine (ZANAFLEX) 4 MG tablet [Pharmacy Med Name: TIZANIDINE 4MG TABLETS] 30 tablet 0    Sig: TAKE 1 TABLET BY MOUTH EVERY 8 HOURS AS NEEDED FOR MUSCLE SPASMS. DO NOT TAKE ALCOHOL/DRIVE/OPERATE HEAVY MACHINERY. MAY CAUSE DROWSINESS     Not Delegated - Cardiovascular:  Alpha-2 Agonists - tizanidine Failed - 03/02/2022  3:32 AM      Failed - This refill cannot be delegated      Failed - Valid encounter within last 6 months    Recent Outpatient Visits   None

## 2022-03-23 ENCOUNTER — Encounter: Payer: Self-pay | Admitting: Adult Health

## 2022-03-23 ENCOUNTER — Other Ambulatory Visit (HOSPITAL_COMMUNITY)
Admission: RE | Admit: 2022-03-23 | Discharge: 2022-03-23 | Disposition: A | Discharge status: Home / Self Care | Payer: BC Managed Care – PPO | Source: Ambulatory Visit | Attending: Adult Health | Admitting: Adult Health

## 2022-03-23 ENCOUNTER — Ambulatory Visit (INDEPENDENT_AMBULATORY_CARE_PROVIDER_SITE_OTHER): Payer: BC Managed Care – PPO | Admitting: Adult Health

## 2022-03-23 VITALS — BP 117/69 | HR 86 | Ht 62.0 in | Wt 185.0 lb

## 2022-03-23 DIAGNOSIS — R309 Painful micturition, unspecified: Secondary | ICD-10-CM | POA: Diagnosis not present

## 2022-03-23 DIAGNOSIS — Z01419 Encounter for gynecological examination (general) (routine) without abnormal findings: Secondary | ICD-10-CM

## 2022-03-23 DIAGNOSIS — N898 Other specified noninflammatory disorders of vagina: Secondary | ICD-10-CM | POA: Insufficient documentation

## 2022-03-23 DIAGNOSIS — R350 Frequency of micturition: Secondary | ICD-10-CM | POA: Insufficient documentation

## 2022-03-23 DIAGNOSIS — Z975 Presence of (intrauterine) contraceptive device: Secondary | ICD-10-CM | POA: Insufficient documentation

## 2022-03-23 LAB — POCT URINALYSIS DIPSTICK
Blood, UA: NEGATIVE
Glucose, UA: NEGATIVE
Ketones, UA: NEGATIVE
Nitrite, UA: NEGATIVE
Protein, UA: POSITIVE — AB

## 2022-03-23 NOTE — Progress Notes (Signed)
Patient ID: Deanna Hughes, female   DOB: 02/07/1990, 32 y.o.   MRN: [id number] History of Present Illness: Deanna Hughes is a 32 year old black female,single, [id number] in for a well woman gyn exam and is complaining of urinary frequency and pain with urination and some vaginal discharge. Has nexplanon and periods not regular. She thinks she has odor since GB removed.  Last pap was negative HPV,NILM 10/15/22.  PCP is Dr Nevada Crane.    Current Medications, Allergies, Past Medical History, Past Surgical History, Family History and Social History were reviewed in [employer] record.     Review of Systems: Patient denies any headaches, hearing loss, fatigue, blurred vision, shortness of breath, chest pain, abdominal pain, problems with bowel movements, or intercourse(no sex in a year). No joint pain or mood swings.  See HPI for positives.   Physical Exam:BP 117/69 (BP Location: Right Arm, Patient Position: Sitting, Cuff Size: Normal)   Pulse 86   Ht '5\' 2"'$  (1.575 m)   Wt 185 lb (83.9 kg)   BMI 33.84 kg/m  Urine dipstick was trace leuks,+protein General:  Well developed, well nourished, no acute distress Skin:  Warm and dry Neck:  Midline trachea, normal thyroid, good ROM, no lymphadenopathy Lungs; Clear to auscultation bilaterally Breast:  No dominant palpable mass, retraction, or nipple discharge Cardiovascular: Regular rate and rhythm Abdomen:  Soft, non tender, no hepatosplenomegaly Pelvic:  External genitalia is normal in appearance, no lesions.  The vagina is normal in appearance. Urethra has no lesions or masses. The cervix is bulbous.  Uterus is felt to be normal size, shape, and contour.  No adnexal masses or tenderness noted.Bladder is non tender, no masses felt.CV swab obtained. Extremities/musculoskeletal:  No swelling or varicosities noted, no clubbing or cyanosis Psych:  No mood changes, alert and cooperative,seems happy AA is 0 Fall risk is low    03/23/2022     1:38 PM 10/14/2020    3:05 PM 07/09/2020    2:57 PM  Depression screen PHQ 2/9  Decreased Interest 0 3 0  Down, Depressed, Hopeless 1 1 0  PHQ - 2 Score 1 4 0  Altered sleeping 0 0   Tired, decreased energy 2 2   Change in appetite 2 0   Feeling bad or failure about yourself  0 1   Trouble concentrating 0 0   Moving slowly or fidgety/restless 0 0   Suicidal thoughts 0 0   PHQ-9 Score 5 7        03/23/2022    1:39 PM 10/14/2020    3:06 PM 10/02/2018   10:55 AM  GAD 7 : Generalized Anxiety Score  Nervous, Anxious, on Edge 0 0 2  Control/stop worrying 0 0 1  Worry too much - different things 0 0 1  Trouble relaxing 0 0 0  Restless 0 0 0  Easily annoyed or irritable 0 0 1  Afraid - awful might happen 0 0 0  Total GAD 7 Score 0 0 5  Anxiety Difficulty   Very difficult      Upstream - 03/23/22 1345       Pregnancy Intention Screening   Does the patient want to become pregnant in the next year? No    Does the patient's partner want to become pregnant in the next year? No    Would the patient like to discuss contraceptive options today? No      Contraception Wrap Up   Current Method Hormonal Implant  End Method Hormonal Implant             Examination chaperoned by Alvera Singh NP student   Impression and Plan: 1. Pain with urination UA C&S sent  - POCT Urinalysis Dipstick - Urine Culture - Urinalysis, Routine w reflex microscopic  2. Urinary frequency UA C&S sent to rule out UTI - POCT Urinalysis Dipstick - Urine Culture - Urinalysis, Routine w reflex microscopic  3. Encounter for well woman exam with routine gynecological exam Physical and pap in 1 year Labs with PCP  4. Vaginal odor No odor today  CV swab sent for GC?CHL,trich,BV and yeast  - Cervicovaginal ancillary only( [place])  5. Nexplanon in place Placed 09/13/21

## 2022-03-24 LAB — URINALYSIS, ROUTINE W REFLEX MICROSCOPIC
Bilirubin, UA: NEGATIVE
Glucose, UA: NEGATIVE
Ketones, UA: NEGATIVE
Leukocytes,UA: NEGATIVE
Nitrite, UA: NEGATIVE
RBC, UA: NEGATIVE
Specific Gravity, UA: 1.024 (ref 1.005–1.030)
Urobilinogen, Ur: 1 mg/dL (ref 0.2–1.0)
pH, UA: 7 (ref 5.0–7.5)

## 2022-03-25 LAB — CERVICOVAGINAL ANCILLARY ONLY
Bacterial Vaginitis (gardnerella): NEGATIVE
Candida Glabrata: NEGATIVE
Candida Vaginitis: NEGATIVE
Chlamydia: NEGATIVE
Comment: NEGATIVE
Comment: NEGATIVE
Comment: NEGATIVE
Comment: NEGATIVE
Comment: NEGATIVE
Comment: NORMAL
Neisseria Gonorrhea: NEGATIVE
Trichomonas: NEGATIVE

## 2022-03-25 LAB — URINE CULTURE

## 2022-04-22 ENCOUNTER — Ambulatory Visit: Payer: Self-pay

## 2022-05-18 DIAGNOSIS — J302 Other seasonal allergic rhinitis: Secondary | ICD-10-CM | POA: Diagnosis not present

## 2022-05-18 DIAGNOSIS — M419 Scoliosis, unspecified: Secondary | ICD-10-CM | POA: Diagnosis not present

## 2022-05-18 DIAGNOSIS — E669 Obesity, unspecified: Secondary | ICD-10-CM | POA: Diagnosis not present

## 2022-05-18 DIAGNOSIS — M5442 Lumbago with sciatica, left side: Secondary | ICD-10-CM | POA: Diagnosis not present

## 2022-05-18 DIAGNOSIS — E538 Deficiency of other specified B group vitamins: Secondary | ICD-10-CM | POA: Insufficient documentation

## 2022-05-20 ENCOUNTER — Ambulatory Visit
Admission: RE | Admit: 2022-05-20 | Discharge: 2022-05-20 | Disposition: A | Discharge status: Home / Self Care | Payer: BC Managed Care – PPO | Source: Ambulatory Visit

## 2022-05-20 VITALS — BP 101/74 | Temp 98.9°F | Resp 18

## 2022-05-20 DIAGNOSIS — J3489 Other specified disorders of nose and nasal sinuses: Secondary | ICD-10-CM

## 2022-05-20 DIAGNOSIS — J302 Other seasonal allergic rhinitis: Secondary | ICD-10-CM

## 2022-05-20 MED ORDER — PREDNISONE 20 MG PO TABS: 40.0000 mg | ORAL_TABLET | Freq: Every day | ORAL | 0 refills | Status: AC

## 2022-05-20 MED ORDER — FLUCONAZOLE 150 MG PO TABS: 150.0000 mg | ORAL_TABLET | Freq: Once | ORAL | 0 refills | Status: AC

## 2022-05-20 MED ORDER — METHYLPREDNISOLONE SODIUM SUCC 125 MG IJ SOLR
60.0000 mg | Freq: Once | INTRAMUSCULAR | Status: AC
  Administered 2022-05-20: 60 mg via INTRAMUSCULAR

## 2022-05-20 NOTE — ED Provider Notes (Signed)
[facility]    CSN: [id number] Arrival date & time: 05/20/22  1254      History   Chief Complaint Chief Complaint  Patient presents with   Cough    Sinus infection ears stopped up and pressure behind eyes - Entered by patient   Appointment    1300    HPI Deanna Hughes is a 32 y.o. female.   Patient presents today with a weeklong history of URI symptoms including sinus pressure and bilateral otalgia.  She was seen by her primary care when symptoms began and was told to take cetirizine/pseudoephedrine.  She has been taking this medication as prescribed without improvement of symptoms.  She reports that her symptoms have worsened and she is now experiencing significant pressure in her sinuses.  Denies any associated shortness of breath, fever, severe cough, nausea/vomiting.  She reports that she generally gets sick with this a few times per year and require steroids plus or minus antibiotics.  She is requesting a steroid injection today.  Denies any history of diabetes.  She has no concern for pregnancy.  Denies any known sick contacts.  Denies any recent antibiotics or steroids.    Past Medical History:  Diagnosis Date   Anxiety    BV (bacterial vaginosis) 04/02/2015   Chlamydia    treated 6/6   Chronic back pain    Chronic headaches    MIGRAINES   Contraceptive management 01/25/2013   Depression    Not on meds currently   Encounter for Nexplanon removal 02/27/2015   Irregular bleeding 06/03/2013   Mid back pain 05/17/2018   Miscarriage 03/27/2013   Neck pain 05/17/2018   Nexplanon insertion 04/11/2013   nexplanon inserted 04/11/13 remove 3/32/18   Right inguinal hernia 12/14/2015   Right sided abdominal pain    Scoliosis    Seasonal allergies 07/03/2012   Sinus infection 08/27/2013   Supervision of normal pregnancy 08/27/2015    [facility]iated Care at   7+5 weeks  FOB  Dating By  LMP  Pap  GC/CT Initial:                36+wks: Genetic Screen NT/IT:  CF  screen  Anatomic [place]  Flu vaccine  Tdap Recommended ~ 28wks Glucose Screen  2 hr GBS  Feed Preference  Contraception  Circumcision  Childbirth Classes  Pediatrician     Threatened abortion in early pregnancy 03/20/2013   Vaginal discharge 10/03/2012   Yeast infection 07/03/2012    Patient Active Problem List   Diagnosis Date Noted   Encounter for well woman exam with routine gynecological exam 03/23/2022   Urinary frequency 03/23/2022   Pain with urination 03/23/2022   Vaginal odor 03/23/2022   Nexplanon in place 03/23/2022   Encounter for initial prescription of contraceptive pills 10/19/2020   Leg swelling 07/09/2020   Sore throat 07/09/2020   Decreased libido 03/31/2020   Depo-Provera contraceptive status 03/31/2020   Irregular bleeding 03/31/2020   BV (bacterial vaginosis) 03/31/2020   Rash and nonspecific skin eruption 10/03/2019   Intractable chronic migraine without aura and without status migrainosus 10/03/2019   Umbilical hernia 03/18/2019   Migraine without aura and without status migrainosus, not intractable 02/11/2019   Internal hemorrhoids    Other idiopathic scoliosis, thoracolumbar region 05/17/2018    Past Surgical History:  Procedure Laterality Date   BIOPSY  09/18/2018   Procedure: BIOPSY;  Surgeon: West Bali, MD;  Location: [facility];  Service: Endoscopy;;  CHOLECYSTECTOMY N/A 01/07/2020   Procedure: LAPAROSCOPIC CHOLECYSTECTOMY;  Surgeon: Quentin Ore, MD;  Location: [facility];  Service: General;  Laterality: N/A;   COLONOSCOPY WITH PROPOFOL N/A 09/18/2018   Procedure: COLONOSCOPY WITH PROPOFOL;  Surgeon: West Bali, MD;  Location: [facility];  Service: Endoscopy;  Laterality: N/A;  8:30am   FLEXIBLE SIGMOIDOSCOPY N/A 10/05/2018   Procedure: FLEXIBLE SIGMOIDOSCOPY;  Surgeon: West Bali, MD;  Location: [facility];  Service: Endoscopy;  Laterality: N/A;  8:30   HEMORRHOID BANDING N/A 10/05/2018   Procedure: [place];  Surgeon:  West Bali, MD;  Location: [facility];  Service: Endoscopy;  Laterality: N/A;   HERNIA REPAIR     INGUINAL HERNIA REPAIR Right 04/19/2017   Procedure: HERNIA REPAIR INGUINAL ADULT;  Surgeon: Earline Mayotte, MD;  Location: [facility];  Service: General;  Laterality: Right;   WISDOM TOOTH EXTRACTION      OB History     Gravida  6   Para  4   Term  4   Preterm  0   AB  2   Living  4      SAB  2   IAB  0   Ectopic  0   Multiple  0   Live Births  4        Obstetric Comments  1st Menstrual Cycle:  15  1st Pregnancy:  18           Home Medications    Prior to Admission medications   Medication Sig Start Date End Date Taking? Authorizing Provider  cetirizine-pseudoephedrine (ZYRTEC-D) 5-120 MG tablet Take 1 tablet by mouth 2 (two) times daily.   Yes [provider]  fluconazole (DIFLUCAN) 150 MG tablet Take 1 tablet (150 mg total) by mouth once for 1 dose. 05/20/22 05/20/22 Yes Kazi Reppond, Denny Peon K, PA-C  predniSONE (DELTASONE) 20 MG tablet Take 2 tablets (40 mg total) by mouth daily for 3 days. 05/20/22 05/23/22 Yes Elleah Hemsley, Noberto Retort, PA-C  etonogestrel (NEXPLANON) 68 MG IMPL implant 1 each by Subdermal route once.    [provider]  Vitamin D, Ergocalciferol, (DRISDOL) 1.25 MG (50000 UNIT) CAPS capsule Take 50,000 Units by mouth once a week. 01/01/21   [provider]    Family History Family History  Problem Relation Age of Onset   Asthma Mother    Asthma Sister    Asthma Brother    Asthma Daughter    Diabetes Maternal Grandmother    Hypertension Maternal Grandmother    Asthma Maternal Grandmother    Heart disease Maternal Grandmother    Stroke Maternal Grandmother    Hypertension Maternal Grandfather    Asthma Maternal Grandfather    Heart disease Maternal Grandfather    Asthma Son    Colon cancer Neg Hx    Colon polyps Neg Hx     Social History Social History   Tobacco Use   Smoking status: Never    Passive exposure:  Never   Smokeless tobacco: Never  Vaping Use   Vaping Use: Never used  Substance Use Topics   Alcohol use: Not Currently   Drug use: No     Allergies   Aspirin, Advil [ibuprofen], and Latex   Review of Systems Review of Systems  Constitutional:  Positive for activity change. Negative for appetite change, fatigue and fever.  HENT:  Positive for congestion, postnasal drip and sinus pressure. Negative for sneezing and sore throat.   Respiratory:  Negative for  cough and shortness of breath.   Cardiovascular:  Negative for chest pain.  Gastrointestinal:  Negative for abdominal pain, diarrhea, nausea and vomiting.  Neurological:  Positive for headaches. Negative for dizziness and light-headedness.     Physical Exam Triage Vital Signs ED Triage Vitals  Enc Vitals Group     BP 05/20/22 1332 101/74     Pulse --      Resp 05/20/22 1332 18     Temp 05/20/22 1332 98.9 F (37.2 C)     Temp Source 05/20/22 1332 Oral     SpO2 05/20/22 1332 99 %     Weight --      Height --      Head Circumference --      Peak Flow --      Pain Score 05/20/22 1331 7     Pain Loc --      Pain Edu? --      Excl. in GC? --    No data found.  Updated Vital Signs BP 101/74 (BP Location: Right Arm)   Temp 98.9 F (37.2 C) (Oral)   Resp 18   SpO2 99%   Visual Acuity Right Eye Distance:   Left Eye Distance:   Bilateral Distance:    Right Eye Near:   Left Eye Near:    Bilateral Near:     Physical Exam Vitals reviewed.  Constitutional:      General: She is awake. She is not in acute distress.    Appearance: Normal appearance. She is well-developed. She is not ill-appearing.     Comments: Very pleasant female appears stated age in no acute distress sitting comfortably in exam room  HENT:     Head: Normocephalic and atraumatic.     Right Ear: Tympanic membrane, ear canal and external ear normal. Tympanic membrane is not erythematous or bulging.     Left Ear: Tympanic membrane, ear canal  and external ear normal. Tympanic membrane is not erythematous or bulging.     Nose:     Right Sinus: Maxillary sinus tenderness present. No frontal sinus tenderness.     Left Sinus: Maxillary sinus tenderness present. No frontal sinus tenderness.     Mouth/Throat:     Pharynx: Uvula midline. No oropharyngeal exudate or posterior oropharyngeal erythema.  Cardiovascular:     Rate and Rhythm: Normal rate and regular rhythm.     Heart sounds: Normal heart sounds, S1 normal and S2 normal. No murmur heard. Pulmonary:     Effort: Pulmonary effort is normal.     Breath sounds: Normal breath sounds. No wheezing, rhonchi or rales.     Comments: Clear to auscultation bilaterally Psychiatric:        Behavior: Behavior is cooperative.      UC Treatments / Results  Labs (all labs ordered are listed, but only abnormal results are displayed) Labs Reviewed - No data to display  EKG   Radiology No results found.  Procedures Procedures (including critical care time)  Medications Ordered in UC Medications  methylPREDNISolone sodium succinate (SOLU-MEDROL) 125 mg/2 mL injection 60 mg (60 mg Intramuscular Given 05/20/22 1402)    Initial Impression / Assessment and Plan / UC Course  I have reviewed the triage vital signs and the nursing notes.  Pertinent labs & imaging results that were available during my care of the patient were reviewed by me and considered in my medical decision making (see chart for details).     Patient is well-appearing, afebrile, nontoxic, nontachycardic.  Viral testing was deferred as she has been symptomatic for 5+ days and therefore no longer candidate for antiviral therapy.  No evidence of acute infection on physical exam that would warrant initiation of antibiotics.  She was given 60 mg of Solu-Medrol in clinic and will start prednisone burst of 40 mg for additional 3 days tomorrow (05/21/2022).  She was instructed not to take NSAIDs with this medication due to risk  of GI bleeding.  She reports often getting yeast infection with steroids and antibiotics since her request to have a Diflucan on hand in case she develops the symptoms.  This was provided per her request.  She was encouraged to continue over-the-counter medications including antihistamines, Mucinex, Flonase.  Also recommended nasal saline and sinus pressure.  We discussed that if she has any worsening or changing symptoms she needs to be reevaluated including fever, cough, worsening of sinus symptoms.  Strict return precautions given.  Work excuse note provided.  Final Clinical Impressions(s) / UC Diagnoses   Final diagnoses:  Seasonal allergies  Sinus pressure     Discharge Instructions      I believe that your seasonal allergies have led to sinus pressure.  I do not think you need an antibiotic at this time.  We gave an injection of steroids today.  Please start prednisone 40 mg for the next 3 days beginning tomorrow (05/21/2022).  Do not take NSAIDs with this medication including aspirin, ibuprofen/Advil, naproxen/Aleve.  I do recommend you continue over-the-counter antihistamines.  I have called in 1 dose of Diflucan in case you need it.  I also recommend nasal saline and sinus rinses.  If you have any worsening symptoms including cough, shortness of breath, fever, worsening discomfort you should be seen immediately.     ED Prescriptions     Medication Sig Dispense Auth. Provider   predniSONE (DELTASONE) 20 MG tablet Take 2 tablets (40 mg total) by mouth daily for 3 days. 6 tablet Jacques Willingham K, PA-C   fluconazole (DIFLUCAN) 150 MG tablet Take 1 tablet (150 mg total) by mouth once for 1 dose. 1 tablet Hilary Pundt, Noberto Retort, PA-C      PDMP not reviewed this encounter.   Jeani Hawking, PA-C 05/20/22 1403

## 2022-05-20 NOTE — ED Triage Notes (Signed)
Pt reports cough x 2 days; sinus pressure, ear pressure x 4 day. Zyrtec D gives no relief.

## 2022-05-20 NOTE — Discharge Instructions (Signed)
I believe that your seasonal allergies have led to sinus pressure.  I do not think you need an antibiotic at this time.  We gave an injection of steroids today.  Please start prednisone 40 mg for the next 3 days beginning tomorrow (05/21/2022).  Do not take NSAIDs with this medication including aspirin, ibuprofen/Advil, naproxen/Aleve.  I do recommend you continue over-the-counter antihistamines.  I have called in 1 dose of Diflucan in case you need it.  I also recommend nasal saline and sinus rinses.  If you have any worsening symptoms including cough, shortness of breath, fever, worsening discomfort you should be seen immediately.

## 2022-05-28 ENCOUNTER — Ambulatory Visit: Payer: Self-pay

## 2022-05-29 ENCOUNTER — Ambulatory Visit: Payer: Self-pay

## 2022-06-03 DIAGNOSIS — E538 Deficiency of other specified B group vitamins: Secondary | ICD-10-CM | POA: Diagnosis not present

## 2022-06-03 DIAGNOSIS — E559 Vitamin D deficiency, unspecified: Secondary | ICD-10-CM | POA: Diagnosis not present

## 2022-06-03 DIAGNOSIS — E669 Obesity, unspecified: Secondary | ICD-10-CM | POA: Diagnosis not present

## 2022-06-06 DIAGNOSIS — E559 Vitamin D deficiency, unspecified: Secondary | ICD-10-CM | POA: Diagnosis not present

## 2022-06-06 DIAGNOSIS — R7301 Impaired fasting glucose: Secondary | ICD-10-CM | POA: Insufficient documentation

## 2022-06-06 DIAGNOSIS — E538 Deficiency of other specified B group vitamins: Secondary | ICD-10-CM | POA: Diagnosis not present

## 2022-06-06 DIAGNOSIS — N390 Urinary tract infection, site not specified: Secondary | ICD-10-CM | POA: Diagnosis not present

## 2022-06-06 DIAGNOSIS — M25473 Effusion, unspecified ankle: Secondary | ICD-10-CM | POA: Insufficient documentation

## 2022-06-06 DIAGNOSIS — E669 Obesity, unspecified: Secondary | ICD-10-CM | POA: Diagnosis not present

## 2022-08-07 ENCOUNTER — Ambulatory Visit
Admission: RE | Admit: 2022-08-07 | Discharge: 2022-08-07 | Disposition: A | Discharge status: Home / Self Care | Payer: BC Managed Care – PPO | Source: Ambulatory Visit | Attending: Nurse Practitioner | Admitting: Nurse Practitioner

## 2022-08-07 VITALS — BP 111/74 | HR 78 | Temp 98.3°F | Resp 17

## 2022-08-07 DIAGNOSIS — L282 Other prurigo: Secondary | ICD-10-CM | POA: Diagnosis not present

## 2022-08-07 MED ORDER — TRIAMCINOLONE ACETONIDE 0.1 % EX CREA: 1.0000 | TOPICAL_CREAM | Freq: Two times a day (BID) | CUTANEOUS | 0 refills | Status: DC

## 2022-08-07 NOTE — ED Provider Notes (Signed)
[facility]    CSN: [id number] Arrival date & time: 08/07/22  0851      History   Chief Complaint Chief Complaint  Patient presents with   Insect Bite    Entered by patient    HPI Deanna Hughes is a 32 y.o. female.   The history is provided by the patient.   The patient presents for complaints of insect bite to the right foot, back of the right leg, and to her right arm.  Symptoms started last evening.  Patient states the areas are "itchy".  Patient denies fever, chills, chest pain, abdominal pain, nausea, vomiting, diarrhea, oozing or drainage from the sites.  Patient states that she applied Benadryl cream, with no relief.  Patient reports that she does take Zyrtec daily for allergies. Past Medical History:  Diagnosis Date   Anxiety    BV (bacterial vaginosis) 04/02/2015   Chlamydia    treated 6/6   Chronic back pain    Chronic headaches    MIGRAINES   Contraceptive management 01/25/2013   Depression    Not on meds currently   Encounter for Nexplanon removal 02/27/2015   Irregular bleeding 06/03/2013   Mid back pain 05/17/2018   Miscarriage 03/27/2013   Neck pain 05/17/2018   Nexplanon insertion 04/11/2013   nexplanon inserted 04/11/13 remove 3/32/18   Right inguinal hernia 12/14/2015   Right sided abdominal pain    Scoliosis    Seasonal allergies 07/03/2012   Sinus infection 08/27/2013   Supervision of normal pregnancy 08/27/2015    [facility] at   7+5 weeks  FOB  Dating By  LMP  Pap  GC/CT Initial:                36+wks: Genetic Screen NT/IT:  CF screen  Anatomic [place]  Flu vaccine  Tdap Recommended ~ 28wks Glucose Screen  2 hr GBS  Feed Preference  Contraception  Circumcision  Childbirth Classes  Pediatrician     Threatened abortion in early pregnancy 03/20/2013   Vaginal discharge 10/03/2012   Yeast infection 07/03/2012    Patient Active Problem List   Diagnosis Date Noted   Encounter for well woman exam with routine gynecological exam  03/23/2022   Urinary frequency 03/23/2022   Pain with urination 03/23/2022   Vaginal odor 03/23/2022   Nexplanon in place 03/23/2022   Encounter for initial prescription of contraceptive pills 10/19/2020   Leg swelling 07/09/2020   Sore throat 07/09/2020   Decreased libido 03/31/2020   Depo-Provera contraceptive status 03/31/2020   Irregular bleeding 03/31/2020   BV (bacterial vaginosis) 03/31/2020   Rash and nonspecific skin eruption 10/03/2019   Intractable chronic migraine without aura and without status migrainosus 10/03/2019   Umbilical hernia 03/18/2019   Migraine without aura and without status migrainosus, not intractable 02/11/2019   Internal hemorrhoids    Other idiopathic scoliosis, thoracolumbar region 05/17/2018    Past Surgical History:  Procedure Laterality Date   BIOPSY  09/18/2018   Procedure: BIOPSY;  Surgeon: West Bali, MD;  [facility]: [facility];  Service: Endoscopy;;   CHOLECYSTECTOMY N/A 01/07/2020   Procedure: LAPAROSCOPIC CHOLECYSTECTOMY;  Surgeon: Quentin Ore, MD;  Location: [facility];  Service: General;  Laterality: N/A;   COLONOSCOPY WITH PROPOFOL N/A 09/18/2018   Procedure: COLONOSCOPY WITH PROPOFOL;  Surgeon: West Bali, MD;  Location: [facility];  Service: Endoscopy;  Laterality: N/A;  8:30am   FLEXIBLE SIGMOIDOSCOPY N/A 10/05/2018   Procedure: FLEXIBLE  SIGMOIDOSCOPY;  Surgeon: West Bali, MD;  [facility]: [facility];  Service: Endoscopy;  Laterality: N/A;  8:30   HEMORRHOID BANDING N/A 10/05/2018   Procedure: [place];  Surgeon: West Bali, MD;  [facility]: [facility];  Service: Endoscopy;  Laterality: N/A;   HERNIA REPAIR     INGUINAL HERNIA REPAIR Right 04/19/2017   Procedure: HERNIA REPAIR INGUINAL ADULT;  Surgeon: Earline Mayotte, MD;  Location: [facility];  Service: General;  Laterality: Right;   WISDOM TOOTH EXTRACTION      OB History     Gravida  6   Para  4   Term  4   Preterm  0   AB  2    Living  4      SAB  2   IAB  0   Ectopic  0   Multiple  0   Live Births  4        Obstetric Comments  1st Menstrual Cycle:  15  1st Pregnancy:  18           Home Medications    Prior to Admission medications   Medication Sig Start Date End Date Taking? Authorizing Provider  triamcinolone cream (KENALOG) 0.1 % Apply 1 Application topically 2 (two) times daily. 08/07/22  Yes Rasool Rommel-Warren, Sadie Haber, NP  cetirizine-pseudoephedrine (ZYRTEC-D) 5-120 MG tablet Take 1 tablet by mouth 2 (two) times daily.    [provider]  etonogestrel (NEXPLANON) 68 MG IMPL implant 1 each by Subdermal route once.    [provider]  Vitamin D, Ergocalciferol, (DRISDOL) 1.25 MG (50000 UNIT) CAPS capsule Take 50,000 Units by mouth once a week. 01/01/21   [provider]    Family History Family History  Problem Relation Age of Onset   Asthma Mother    Asthma Sister    Asthma Brother    Asthma Daughter    Diabetes Maternal Grandmother    Hypertension Maternal Grandmother    Asthma Maternal Grandmother    Heart disease Maternal Grandmother    Stroke Maternal Grandmother    Hypertension Maternal Grandfather    Asthma Maternal Grandfather    Heart disease Maternal Grandfather    Asthma Son    Colon cancer Neg Hx    Colon polyps Neg Hx     Social History Social History   Tobacco Use   Smoking status: Never    Passive exposure: Never   Smokeless tobacco: Never  Vaping Use   Vaping status: Never Used  Substance Use Topics   Alcohol use: Not Currently   Drug use: No     Allergies   Aspirin, Advil [ibuprofen], and Latex   Review of Systems Review of Systems Per HPI  Physical Exam Triage Vital Signs ED Triage Vitals  Encounter Vitals Group     BP 08/07/22 0859 111/74     Systolic BP Percentile --      Diastolic BP Percentile --      Pulse Rate 08/07/22 0859 78     Resp 08/07/22 0859 17     Temp 08/07/22 0859 98.3 F (36.8 C)     Temp  Source 08/07/22 0859 Oral     SpO2 08/07/22 0859 98 %     Weight --      Height --      Head Circumference --      Peak Flow --      Pain Score 08/07/22 0902 0     Pain Loc --  Pain Education --      Exclude from Growth Chart --    No data found.  Updated Vital Signs BP 111/74 (BP Location: Right Arm)   Pulse 78   Temp 98.3 F (36.8 C) (Oral)   Resp 17   SpO2 98%   Visual Acuity Right Eye Distance:   Left Eye Distance:   Bilateral Distance:    Right Eye Near:   Left Eye Near:    Bilateral Near:     Physical Exam Vitals and nursing note reviewed.  Constitutional:      General: She is not in acute distress.    Appearance: Normal appearance.  HENT:     Head: Normocephalic.  Eyes:     Extraocular Movements: Extraocular movements intact.     Conjunctiva/sclera: Conjunctivae normal.     Pupils: Pupils are equal, round, and reactive to light.  Cardiovascular:     Rate and Rhythm: Normal rate and regular rhythm.     Pulses: Normal pulses.     Heart sounds: Normal heart sounds.  Pulmonary:     Effort: Pulmonary effort is normal. No respiratory distress.     Breath sounds: Normal breath sounds. No stridor. No wheezing, rhonchi or rales.  Abdominal:     General: Bowel sounds are normal.     Palpations: Abdomen is soft.  Musculoskeletal:     Cervical back: Normal range of motion.  Skin:    General: Skin is warm and dry.     Comments: Erythematous induration noted to the lateral aspect of the right foot at the first metatarsal, to the posterior calf of the right leg, and to the posterior aspect of the right wrist.  Largest induration is noted to the right wrist which measures approximately 4 cm in diameter.  Areas are slightly raised, and mildly warm to palpation.  There is no oozing, fluctuance, or drainage present.  Neurological:     General: No focal deficit present.     Mental Status: She is alert and oriented to person, place, and time.  Psychiatric:         Mood and Affect: Mood normal.        Behavior: Behavior normal.      UC Treatments / Results  Labs (all labs ordered are listed, but only abnormal results are displayed) Labs Reviewed - No data to display  EKG   Radiology No results found.  Procedures Procedures (including critical care time)  Medications Ordered in UC Medications - No data to display  Initial Impression / Assessment and Plan / UC Course  I have reviewed the triage vital signs and the nursing notes.  Pertinent labs & imaging results that were available during my care of the patient were reviewed by me and considered in my medical decision making (see chart for details).  The patient is well-appearing, she is in no acute distress, vital signs are stable.  Suspect papular urticaria, most likely caused by mosquito bites.  Will treat with triamcinolone cream 0.1%.  Supportive care recommendations were provided and discussed with the patient to include over-the-counter Benadryl, cool compresses, and to avoid scratching or rubbing the areas.  Patient advised to follow-up if symptoms fail to improve.  Patient is in agreement with this plan of care and verbalizes understanding.  All questions were answered.  Patient stable for discharge.   Final Clinical Impressions(s) / UC Diagnoses   Final diagnoses:  Papular urticaria     Discharge Instructions  Apply medication as prescribed. May take Benadryl at bedtime as directed.  Continue your current allergy regimen. Apply cool compresses to the area to help with itching and swelling. Avoid scratching, rubbing, or manipulating the areas while symptoms persist. Follow-up in this clinic or with your primary care physician if symptoms fail to improve. Follow-up as needed.     ED Prescriptions     Medication Sig Dispense Auth. Provider   triamcinolone cream (KENALOG) 0.1 % Apply 1 Application topically 2 (two) times daily. 30 g Kenn Rekowski-Warren, Sadie Haber, NP       PDMP not reviewed this encounter.   Abran Cantor, NP 08/07/22 [phone]

## 2022-08-07 NOTE — ED Triage Notes (Signed)
Pt reports she first got bit on her right foot, back of right leg, and a swollen red bug bite on her right arm x 1 day.   Applied benadryl cream but no relief.

## 2022-08-07 NOTE — Discharge Instructions (Addendum)
Apply medication as prescribed. May take Benadryl at bedtime as directed.  Continue your current allergy regimen. Apply cool compresses to the area to help with itching and swelling. Avoid scratching, rubbing, or manipulating the areas while symptoms persist. Follow-up in this clinic or with your primary care physician if symptoms fail to improve. Follow-up as needed.

## 2022-09-01 ENCOUNTER — Ambulatory Visit
Admission: RE | Admit: 2022-09-01 | Discharge: 2022-09-01 | Disposition: A | Discharge status: Home / Self Care | Payer: BC Managed Care – PPO | Source: Ambulatory Visit | Attending: Family Medicine | Admitting: Family Medicine

## 2022-09-01 VITALS — BP 116/76 | HR 95 | Temp 99.1°F | Resp 16

## 2022-09-01 DIAGNOSIS — K047 Periapical abscess without sinus: Secondary | ICD-10-CM

## 2022-09-01 MED ORDER — AMOXICILLIN-POT CLAVULANATE 875-125 MG PO TABS: 1.0000 | ORAL_TABLET | Freq: Two times a day (BID) | ORAL | 0 refills | Status: DC

## 2022-09-01 MED ORDER — CHLORHEXIDINE GLUCONATE 0.12 % MT SOLN: 15.0000 mL | Freq: Two times a day (BID) | OROMUCOSAL | 0 refills | Status: DC

## 2022-09-01 NOTE — ED Triage Notes (Signed)
Pt c/o dental pain, x 2 weeks possible infection because the pain has gotten worse. Pt states she has been using tylenol for the pain it has not helped.

## 2022-09-01 NOTE — ED Provider Notes (Signed)
[facility]    CSN: [id number] Arrival date & time: 09/01/22  1056      History   Chief Complaint Chief Complaint  Patient presents with   Oral Swelling    Tooth ache and I believe infection - Entered by patient    HPI Deanna Hughes is a 32 y.o. female.   Presenting today with 2 weeks of progressively worsening left lower dental pain.  Denies fever, chills, drainage or bleeding, difficulty breathing or swallowing.  Trying Tylenol with minimal relief.  Tried to get in with the dentist but they are closed today.    Past Medical History:  Diagnosis Date   Anxiety    BV (bacterial vaginosis) 04/02/2015   Chlamydia    treated 6/6   Chronic back pain    Chronic headaches    MIGRAINES   Contraceptive management 01/25/2013   Depression    Not on meds currently   Encounter for Nexplanon removal 02/27/2015   Irregular bleeding 06/03/2013   Mid back pain 05/17/2018   Miscarriage 03/27/2013   Neck pain 05/17/2018   Nexplanon insertion 04/11/2013   nexplanon inserted 04/11/13 remove 3/32/18   Right inguinal hernia 12/14/2015   Right sided abdominal pain    Scoliosis    Seasonal allergies 07/03/2012   Sinus infection 08/27/2013   Supervision of normal pregnancy 08/27/2015    [facility] at   7+5 weeks  FOB  Dating By  LMP  Pap  GC/CT Initial:                36+wks: Genetic Screen NT/IT:  CF screen  Anatomic [place]  Flu vaccine  Tdap Recommended ~ 28wks Glucose Screen  2 hr GBS  Feed Preference  Contraception  Circumcision  Childbirth Classes  Pediatrician     Threatened abortion in early pregnancy 03/20/2013   Vaginal discharge 10/03/2012   Yeast infection 07/03/2012    Patient Active Problem List   Diagnosis Date Noted   Encounter for well woman exam with routine gynecological exam 03/23/2022   Urinary frequency 03/23/2022   Pain with urination 03/23/2022   Vaginal odor 03/23/2022   Nexplanon in place 03/23/2022   Encounter for initial prescription of  contraceptive pills 10/19/2020   Leg swelling 07/09/2020   Sore throat 07/09/2020   Decreased libido 03/31/2020   Depo-Provera contraceptive status 03/31/2020   Irregular bleeding 03/31/2020   BV (bacterial vaginosis) 03/31/2020   Rash and nonspecific skin eruption 10/03/2019   Intractable chronic migraine without aura and without status migrainosus 10/03/2019   Umbilical hernia 03/18/2019   Migraine without aura and without status migrainosus, not intractable 02/11/2019   Internal hemorrhoids    Other idiopathic scoliosis, thoracolumbar region 05/17/2018    Past Surgical History:  Procedure Laterality Date   BIOPSY  09/18/2018   Procedure: BIOPSY;  Surgeon: West Bali, MD;  [facility]: [facility];  Service: Endoscopy;;   CHOLECYSTECTOMY N/A 01/07/2020   Procedure: LAPAROSCOPIC CHOLECYSTECTOMY;  Surgeon: Quentin Ore, MD;  Location: [facility];  Service: General;  Laterality: N/A;   COLONOSCOPY WITH PROPOFOL N/A 09/18/2018   Procedure: COLONOSCOPY WITH PROPOFOL;  Surgeon: West Bali, MD;  Location: [facility];  Service: Endoscopy;  Laterality: N/A;  8:30am   FLEXIBLE SIGMOIDOSCOPY N/A 10/05/2018   Procedure: FLEXIBLE SIGMOIDOSCOPY;  Surgeon: West Bali, MD;  [facility]: [facility];  Service: Endoscopy;  Laterality: N/A;  8:30   HEMORRHOID BANDING N/A 10/05/2018   Procedure: HEMORRHOID BANDING;  Surgeon: West Bali, MD;  [facility]: [facility];  Service: Endoscopy;  Laterality: N/A;   HERNIA REPAIR     INGUINAL HERNIA REPAIR Right 04/19/2017   Procedure: HERNIA REPAIR INGUINAL ADULT;  Surgeon: Earline Mayotte, MD;  Location: [facility];  Service: General;  Laterality: Right;   WISDOM TOOTH EXTRACTION      OB History     Gravida  6   Para  4   Term  4   Preterm  0   AB  2   Living  4      SAB  2   IAB  0   Ectopic  0   Multiple  0   Live Births  4        Obstetric Comments  1st Menstrual Cycle:  15  1st Pregnancy:  18            Home Medications    Prior to Admission medications   Medication Sig Start Date End Date Taking? Authorizing Provider  amoxicillin-clavulanate (AUGMENTIN) 875-125 MG tablet Take 1 tablet by mouth every 12 (twelve) hours. 09/01/22  Yes Particia Nearing, PA-C  chlorhexidine (PERIDEX) 0.12 % solution Use as directed 15 mLs in the mouth or throat 2 (two) times daily. 09/01/22  Yes Particia Nearing, PA-C  cetirizine-pseudoephedrine (ZYRTEC-D) 5-120 MG tablet Take 1 tablet by mouth 2 (two) times daily.    [provider]  etonogestrel (NEXPLANON) 68 MG IMPL implant 1 each by Subdermal route once.    [provider]  triamcinolone cream (KENALOG) 0.1 % Apply 1 Application topically 2 (two) times daily. 08/07/22   Leath-Warren, Sadie Haber, NP  Vitamin D, Ergocalciferol, (DRISDOL) 1.25 MG (50000 UNIT) CAPS capsule Take 50,000 Units by mouth once a week. 01/01/21   [provider]    Family History Family History  Problem Relation Age of Onset   Asthma Mother    Asthma Sister    Asthma Brother    Asthma Daughter    Diabetes Maternal Grandmother    Hypertension Maternal Grandmother    Asthma Maternal Grandmother    Heart disease Maternal Grandmother    Stroke Maternal Grandmother    Hypertension Maternal Grandfather    Asthma Maternal Grandfather    Heart disease Maternal Grandfather    Asthma Son    Colon cancer Neg Hx    Colon polyps Neg Hx     Social History Social History   Tobacco Use   Smoking status: Never    Passive exposure: Never   Smokeless tobacco: Never  Vaping Use   Vaping status: Never Used  Substance Use Topics   Alcohol use: Not Currently   Drug use: No     Allergies   Aspirin, Advil [ibuprofen], and Latex   Review of Systems Review of Systems Per HPI  Physical Exam Triage Vital Signs ED Triage Vitals  Encounter Vitals Group     BP 09/01/22 1112 116/76     Systolic BP Percentile --      Diastolic BP  Percentile --      Pulse Rate 09/01/22 1112 95     Resp 09/01/22 1112 16     Temp 09/01/22 1112 99.1 F (37.3 C)     Temp Source 09/01/22 1112 Oral     SpO2 09/01/22 1112 98 %     Weight --      Height --      Head Circumference --      Peak Flow --  Pain Score 09/01/22 1115 8     Pain Loc --      Pain Education --      Exclude from Growth Chart --    No data found.  Updated Vital Signs BP 116/76 (BP Location: Right Arm)   Pulse 95   Temp 99.1 F (37.3 C) (Oral)   Resp 16   SpO2 98%   Visual Acuity Right Eye Distance:   Left Eye Distance:   Bilateral Distance:    Right Eye Near:   Left Eye Near:    Bilateral Near:     Physical Exam Vitals and nursing note reviewed.  Constitutional:      Appearance: Normal appearance. She is not ill-appearing.  HENT:     Head: Atraumatic.     Mouth/Throat:     Mouth: Mucous membranes are moist.     Pharynx: Oropharynx is clear.     Comments: Gingival erythema, edema to most posterior molar on the left lower jaw Eyes:     Extraocular Movements: Extraocular movements intact.     Conjunctiva/sclera: Conjunctivae normal.  Cardiovascular:     Rate and Rhythm: Normal rate and regular rhythm.     Heart sounds: Normal heart sounds.  Pulmonary:     Effort: Pulmonary effort is normal.     Breath sounds: Normal breath sounds.  Musculoskeletal:        General: Normal range of motion.     Cervical back: Normal range of motion and neck supple.  Skin:    General: Skin is warm and dry.  Neurological:     Mental Status: She is alert and oriented to person, place, and time.  Psychiatric:        Mood and Affect: Mood normal.        Thought Content: Thought content normal.        Judgment: Judgment normal.      UC Treatments / Results  Labs (all labs ordered are listed, but only abnormal results are displayed) Labs Reviewed - No data to display  EKG   Radiology No results found.  Procedures Procedures (including  critical care time)  Medications Ordered in UC Medications - No data to display  Initial Impression / Assessment and Plan / UC Course  I have reviewed the triage vital signs and the nursing notes.  Pertinent labs & imaging results that were available during my care of the patient were reviewed by me and considered in my medical decision making (see chart for details).     Treat with Augmentin, Peridex rinse, close dental follow-up.  Tylenol as needed for pain.  Work note given. Final Clinical Impressions(s) / UC Diagnoses   Final diagnoses:  Dental infection   Discharge Instructions   None    ED Prescriptions     Medication Sig Dispense Auth. Provider   amoxicillin-clavulanate (AUGMENTIN) 875-125 MG tablet Take 1 tablet by mouth every 12 (twelve) hours. 14 tablet Particia Nearing, [place]   chlorhexidine (PERIDEX) 0.12 % solution Use as directed 15 mLs in the mouth or throat 2 (two) times daily. 120 mL Particia Nearing, [place]      PDMP not reviewed this encounter.   Particia Nearing, [place] 09/01/22 1132

## 2022-09-27 DIAGNOSIS — Z1159 Encounter for screening for other viral diseases: Secondary | ICD-10-CM | POA: Diagnosis not present

## 2022-09-27 DIAGNOSIS — Z23 Encounter for immunization: Secondary | ICD-10-CM | POA: Diagnosis not present

## 2022-10-18 ENCOUNTER — Other Ambulatory Visit: Payer: Self-pay | Admitting: Nurse Practitioner

## 2022-10-18 DIAGNOSIS — N898 Other specified noninflammatory disorders of vagina: Secondary | ICD-10-CM

## 2022-10-19 NOTE — Telephone Encounter (Signed)
Unable to refill per protocol, last refill by another provider not at this practice.  Requested Prescriptions  Pending Prescriptions Disp Refills   fluconazole (DIFLUCAN) 150 MG tablet [Pharmacy Med Name: FLUCONAZOLE 150MG  TABLETS] 1 tablet 0    Sig: TAKE 1 TABLET BY MOUTH FOR 1 DONE AS COMPLETION OF ANTIBIOTIC     Off-Protocol Failed - 10/18/2022  6:42 PM      Failed - Medication not assigned to a protocol, review manually.      Failed - Valid encounter within last 12 months    Recent Outpatient Visits   None

## 2022-10-25 DIAGNOSIS — L293 Anogenital pruritus, unspecified: Secondary | ICD-10-CM | POA: Diagnosis not present

## 2022-10-25 DIAGNOSIS — M5442 Lumbago with sciatica, left side: Secondary | ICD-10-CM | POA: Diagnosis not present

## 2022-11-30 DIAGNOSIS — E559 Vitamin D deficiency, unspecified: Secondary | ICD-10-CM | POA: Diagnosis not present

## 2022-11-30 DIAGNOSIS — E669 Obesity, unspecified: Secondary | ICD-10-CM | POA: Diagnosis not present

## 2022-12-07 DIAGNOSIS — E669 Obesity, unspecified: Secondary | ICD-10-CM | POA: Diagnosis not present

## 2022-12-07 DIAGNOSIS — M7701 Medial epicondylitis, right elbow: Secondary | ICD-10-CM | POA: Insufficient documentation

## 2022-12-07 DIAGNOSIS — R7301 Impaired fasting glucose: Secondary | ICD-10-CM | POA: Diagnosis not present

## 2022-12-07 DIAGNOSIS — N760 Acute vaginitis: Secondary | ICD-10-CM | POA: Diagnosis not present

## 2022-12-07 DIAGNOSIS — E559 Vitamin D deficiency, unspecified: Secondary | ICD-10-CM | POA: Diagnosis not present

## 2022-12-07 DIAGNOSIS — E538 Deficiency of other specified B group vitamins: Secondary | ICD-10-CM | POA: Diagnosis not present

## 2022-12-07 DIAGNOSIS — N390 Urinary tract infection, site not specified: Secondary | ICD-10-CM | POA: Diagnosis not present

## 2022-12-07 DIAGNOSIS — R002 Palpitations: Secondary | ICD-10-CM | POA: Insufficient documentation

## 2022-12-07 DIAGNOSIS — R319 Hematuria, unspecified: Secondary | ICD-10-CM | POA: Insufficient documentation

## 2023-01-20 ENCOUNTER — Ambulatory Visit (INDEPENDENT_AMBULATORY_CARE_PROVIDER_SITE_OTHER): Payer: BC Managed Care – PPO | Admitting: Orthopedic Surgery

## 2023-01-20 ENCOUNTER — Encounter: Payer: Self-pay | Admitting: Orthopedic Surgery

## 2023-01-20 VITALS — Ht 62.0 in | Wt 190.0 lb

## 2023-01-20 DIAGNOSIS — M7711 Lateral epicondylitis, right elbow: Secondary | ICD-10-CM

## 2023-01-20 MED ORDER — PREDNISONE 10 MG PO TABS: 10.0000 mg | ORAL_TABLET | Freq: Three times a day (TID) | ORAL | 1 refills | Status: DC

## 2023-01-20 MED ORDER — METHYLPREDNISOLONE ACETATE 40 MG/ML IJ SUSP
40.0000 mg | Freq: Once | INTRAMUSCULAR | Status: AC
  Administered 2023-01-20: 40 mg via INTRA_ARTICULAR

## 2023-01-20 NOTE — Progress Notes (Signed)
 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  Respiratory:  Negative for shortness of breath.   Cardiovascular:  Negative for chest pain.  Neurological:  Negative for tingling.     Allergies  Allergen Reactions   Aspirin Shortness Of Breath and Other (See Comments)    Heart beats fast   Advil  [Ibuprofen ] Other (See Comments)    Breakout, heart palpitations and trouble breathing.    Latex Rash    Current Outpatient Medications  Medication Instructions   etonogestrel  (NEXPLANON ) 68 MG IMPL implant 1 each,  Once   predniSONE  (DELTASONE ) 10 mg, Oral, 3 times daily   Vitamin D (Ergocalciferol) (DRISDOL) 50,000 Units, Weekly      There were no vitals taken for this visit.  There is no height or weight on file to calculate BMI.  General appearance: Well-developed well-nourished no gross deformities  Cardiovascular normal pulse and perfusion normal color without edema  Neurologically no sensation loss or deficits or pathologic reflexes  Psychological: Awake alert and oriented x3 mood and affect normal  Skin no lacerations or ulcerations no nodularity no palpable masses, no erythema or nodularity  Musculoskeletal:  Ortho  Exam  Right elbow slightly swollen tender over the lateral epicondyle and this is worse with wrist extension test especially against resistance no real range of motion deficits no instability no muscle weakness just pain and tenderness     Past Medical History:  Diagnosis Date   Anxiety    BV (bacterial vaginosis) 04/02/2015   Chlamydia    treated 6/6   Chronic back pain    Chronic headaches    MIGRAINES   Contraceptive management 01/25/2013   Depression    Not on meds currently   Encounter for Nexplanon  removal 02/27/2015   Irregular bleeding 06/03/2013   Mid back pain 05/17/2018   Miscarriage 03/27/2013   Neck pain 05/17/2018   Nexplanon  insertion 04/11/2013   nexplanon  inserted 04/11/13 remove 3/32/18   Right inguinal hernia 12/14/2015   Right sided abdominal pain    Scoliosis    Seasonal allergies 07/03/2012   Sinus infection 08/27/2013   Supervision of normal pregnancy 08/27/2015    [facility] Initiated Care at   7+5 weeks  FOB  Dating By  LMP  Pap  GC/CT Initial:                36+wks: Genetic Screen NT/IT:  CF screen  Anatomic US   Flu vaccine  Tdap Recommended ~ 28wks Glucose Screen  2 hr GBS  Feed Preference  Contraception  Circumcision  Childbirth Classes  Pediatrician  Threatened abortion in early pregnancy 03/20/2013   Vaginal discharge 10/03/2012   Yeast infection 07/03/2012    Past Surgical History:  Procedure Laterality Date   BIOPSY  09/18/2018   Procedure: BIOPSY;  Surgeon: Harvey Margo CROME, MD;  [facility]: [facility];  Service: Endoscopy;;   CHOLECYSTECTOMY N/A 01/07/2020   Procedure: LAPAROSCOPIC CHOLECYSTECTOMY;  Surgeon: Lyndel Deward PARAS, MD;  Location: [facility];  Service: General;  Laterality: N/A;   COLONOSCOPY WITH PROPOFOL  N/A 09/18/2018   Procedure: COLONOSCOPY WITH PROPOFOL ;  Surgeon: Harvey Margo CROME, MD;  Location: [facility];  Service: Endoscopy;  Laterality: N/A;  8:30am   FLEXIBLE SIGMOIDOSCOPY N/A 10/05/2018   Procedure: FLEXIBLE SIGMOIDOSCOPY;   Surgeon: Harvey Margo CROME, MD;  Location: [facility];  Service: Endoscopy;  Laterality: N/A;  8:30   HEMORRHOID BANDING N/A 10/05/2018   Procedure: GLENICE CAI;  Surgeon: Harvey Margo CROME, MD;  Location: [facility];  Service: Endoscopy;  Laterality: N/A;   HERNIA REPAIR     INGUINAL HERNIA REPAIR Right 04/19/2017   Procedure: HERNIA REPAIR INGUINAL ADULT;  Surgeon: Dessa Reyes ORN, MD;  Location: [facility];  Service: General;  Laterality: Right;   WISDOM TOOTH EXTRACTION      Family History  Problem Relation Age of Onset   Asthma Mother    Asthma Sister    Asthma Brother    Asthma Daughter    Diabetes Maternal Grandmother    Hypertension Maternal Grandmother    Asthma Maternal Grandmother    Heart disease Maternal Grandmother    Stroke Maternal Grandmother    Hypertension Maternal Grandfather    Asthma Maternal Grandfather    Heart disease Maternal Grandfather    Asthma Son    Colon cancer Neg Hx    Colon polyps Neg Hx    Social History   Tobacco Use   Smoking status: Never    Passive exposure: Never   Smokeless tobacco: Never  Vaping Use   Vaping status: Never Used  Substance Use Topics   Alcohol use: Not Currently   Drug use: No    Allergies  Allergen Reactions   Aspirin Shortness Of Breath and Other (See Comments)    Heart beats fast   Advil  [Ibuprofen ] Other (See Comments)    Breakout, heart palpitations and trouble breathing.    Latex Rash    Current Meds  Medication Sig   etonogestrel  (NEXPLANON ) 68 MG IMPL implant 1 each by Subdermal route once.   predniSONE  (DELTASONE ) 10 MG tablet Take 1 tablet (10 mg total) by mouth 3 (three) times daily.   Vitamin D, Ergocalciferol, (DRISDOL) 1.25 MG (50000 UNIT) CAPS capsule Take 50,000 Units by mouth once a week.     MEDICAL DECISION MAKING   IMAGING: No results found. -No imaging  Orders: NO   Outside records reviewed: NO   C. MANAGEMENT   FU 4 WEEKS   Wrist splint cannot tolerate elbow  strap too much pain and tenderness in the forearm   Meds ordered this encounter  Medications   methylPREDNISolone  acetate (DEPO-MEDROL ) injection 40 mg   predniSONE  (DELTASONE ) 10 MG tablet    Sig: Take 1 tablet (10 mg total) by mouth 3 (three) times daily.    Dispense:  90 tablet    Refill:  1    PROCEDURES: Right elbow injection  Procedure note injection for right tennis elbow   Diagnosis right tennis elbow  Anesthesia ethyl chloride was used Alcohol use is clean the skin  After we  obtained verbal consent and timeout a 25-gauge needle was used to inject 40 mg of Depo-Medrol  and 3 cc of 1% lidocaine  just distal to the insertion of the ECRB  There were no complications and a sterile bandage was applied.    Taft Minerva, MD  01/20/2023 11:08 AM

## 2023-02-15 NOTE — Progress Notes (Signed)
   BP (!) 141/102   Pulse 76   Ht 5\' 2"  (1.575 m)   Wt 190 lb (86.2 kg)   BMI 34.75 kg/m   Body mass index is 34.75 kg/m.  Chief Complaint  Patient presents with   Elbow Pain    Right still has some pain but feels better    Encounter Diagnosis  Name Primary?   Lateral epicondylitis, right elbow Yes    DOI/DOS/ Date: NA  Improved

## 2023-02-17 ENCOUNTER — Encounter: Payer: Self-pay | Admitting: Orthopedic Surgery

## 2023-02-17 ENCOUNTER — Ambulatory Visit (INDEPENDENT_AMBULATORY_CARE_PROVIDER_SITE_OTHER): Payer: BC Managed Care – PPO | Admitting: Orthopedic Surgery

## 2023-02-17 VITALS — BP 141/102 | HR 76 | Ht 62.0 in | Wt 190.0 lb

## 2023-02-17 DIAGNOSIS — M7711 Lateral epicondylitis, right elbow: Secondary | ICD-10-CM | POA: Diagnosis not present

## 2023-02-17 NOTE — Progress Notes (Signed)
FOLLOW-UP OFFICE VISIT   Patient: Deanna Hughes           Date of Birth: 1990/09/06           MRN: [id number] Visit Date: 02/17/2023 Requested by: Benita Stabile, MD [address] [place],  [place] [address] PCP: Benita Stabile, MD    Encounter Diagnosis  Name Primary?   Lateral epicondylitis, right elbow Yes    Chief Complaint  Patient presents with   Elbow Pain    Right still has some pain but feels better    33 year old female with tennis elbow on the right treated with a wrist brace, NSAIDs, injection, activity modification ice stretching.  Patient notes significant improvement with mild residual symptoms  Exam shows mild tenderness over the wrist and mild pain with wrist extension    ASSESSMENT AND PLAN Right tennis elbow  Continue current management modification include  Wrist splint Ice Activity modification Stretching Anti-inflammatories as needed/not able to take ibuprofen Return in 6 weeks repeat injection if necessary

## 2023-02-17 NOTE — Patient Instructions (Signed)
Continue same treatment

## 2023-02-21 NOTE — Progress Notes (Signed)
  Cardiology Office Note:  .   Date:  02/21/2023  ID:  Deanna Hughes, DOB [birth year], MRN [id number] PCP: Deanna Norleen PEDLAR, MD  [facility] Providers Cardiologist:  None    History of Present Illness: .   Deanna Hughes is a 33 y.o. female referral for palpitations.She had an echo in 2013  when she was [redacted] weeks pregnant. She had moderate PI. Otherwise normal LV/RV fxn. She has four children. Today she states has had palpitations for a few months and some chest pain.  She notes symptoms have been off and on for a couple years but have picked up more frequently recently.  She let her primary provider know this and was referred to cardiology. Grandfather had an MI  ROS:  per HPI otherwise negative  Studies Reviewed: [name]        EKG Interpretation Date/Time:  Wednesday February 22 2023 09:52:41 EST Ventricular Rate:  85 PR Interval:  142 QRS Duration:  74 QT Interval:  350 QTC Calculation: 416 R Axis:   46  Text Interpretation: Normal sinus rhythm with sinus arrhythmia Normal ECG When compared with ECG of 13-Mar-2021 11:11, No significant change since last tracing Confirmed by Alvan Shuck ([id number]) on 02/22/2023 10:00:36 AM  Risk Assessment/Calculations:        Physical Exam:   VS:   Vitals:   02/22/23 0946  BP: 100/74  Pulse: 85  SpO2: 98%    Wt Readings from Last 3 Encounters:  02/17/23 190 lb (86.2 kg)  01/20/23 190 lb (86.2 kg)  03/23/22 185 lb (83.9 kg)    GEN: Well nourished, well developed in no acute distress NECK: No JVD CARDIAC: RRR, no murmurs, rubs, gallops RESPIRATORY:  Clear to auscultation without rales, wheezing or rhonchi  ABDOMEN: Soft, non-tender, non-distended EXTREMITIES:  No edema; No deformity   ASSESSMENT AND PLAN: .   Moderate PI During pregnancy. Will repeat TTE  Palpitations -7 day ziopatch       Dispo: Follow up in 6 months with an APP  Signed, Dorla Guizar, Shuck BRAVO, MD

## 2023-02-22 ENCOUNTER — Encounter: Payer: Self-pay | Admitting: Internal Medicine

## 2023-02-22 ENCOUNTER — Ambulatory Visit: Payer: BC Managed Care – PPO | Attending: Internal Medicine

## 2023-02-22 ENCOUNTER — Ambulatory Visit: Payer: BC Managed Care – PPO | Attending: Internal Medicine | Admitting: Internal Medicine

## 2023-02-22 VITALS — BP 100/74 | HR 85 | Ht 62.0 in | Wt 193.4 lb

## 2023-02-22 DIAGNOSIS — Z136 Encounter for screening for cardiovascular disorders: Secondary | ICD-10-CM

## 2023-02-22 DIAGNOSIS — I371 Nonrheumatic pulmonary valve insufficiency: Secondary | ICD-10-CM

## 2023-02-22 DIAGNOSIS — R002 Palpitations: Secondary | ICD-10-CM

## 2023-02-22 NOTE — Progress Notes (Unsigned)
 Enrolled patient for a 7 day Zio XT monitor to be mailed

## 2023-02-22 NOTE — Patient Instructions (Addendum)
 Medication Instructions:  No changes  *If you need a refill on your cardiac medications before your next appointment, please call your pharmacy*  Lab Work: None   Testing/Procedures: Echo next available  Your physician has requested that you have an echocardiogram. Echocardiography is a painless test that uses sound waves to create images of your heart. It provides your doctor with information about the size and shape of your heart and how well your heart's chambers and valves are working. This procedure takes approximately one hour. There are no restrictions for this procedure. Please do NOT wear cologne, perfume, aftershave, or lotions (deodorant is allowed). Please arrive 15 minutes prior to your appointment time.  Please note: We ask at that you not bring children with you during ultrasound (echo/ vascular) testing. Due to room size and safety concerns, children are not allowed in the ultrasound rooms during exams. Our front office staff cannot provide observation of children in our lobby area while testing is being conducted. An adult accompanying a patient to their appointment will only be allowed in the ultrasound room at the discretion of the ultrasound technician under special circumstances. We apologize for any inconvenience.     Your physician has recommended that you wear a 7 DAY ZIO-PATCH monitor. The Zio patch cardiac monitor continuously records heart rhythm data for up to 14 days, this is for patients being evaluated for multiple types heart rhythms. For the first 24 hours post application, please avoid getting the Zio monitor wet in the shower or by excessive sweating during exercise. After that, feel free to carry on with regular activities. Keep soaps and lotions away from the ZIO XT Patch.  This will be mailed to you, please expect 7-10 days to receive.    Applying the monitor   Shave hair from upper left chest.   Hold abrader disc by orange tab.  Rub abrader in 40 strokes  over left upper chest as indicated in your monitor instructions.   Clean area with 4 enclosed alcohol pads .  Use all pads to assure are is cleaned thoroughly.  Let dry.   Apply patch as indicated in monitor instructions.  Patch will be place under collarbone on left side of chest with arrow pointing upward.   Rub patch adhesive wings for 2 minutes.Remove white label marked 1.  Remove white label marked 2.  Rub patch adhesive wings for 2 additional minutes.   While looking in a mirror, press and release button in center of patch.  A small green light will flash 3-4 times .  This will be your only indicator the monitor has been turned on.     Do not shower for the first 24 hours.  You may shower after the first 24 hours.   Press button if you feel a symptom. You will hear a small click.  Record Date, Time and Symptom in the Patient Log Book.   When you are ready to remove patch, follow instructions on last 2 pages of Patient Log Book.  Stick patch monitor onto last page of Patient Log Book.   Place Patient Log Book in [place] box.  Use locking tab on box and tape box closed securely.  The [employer] has [employer] on it.  Please place in mailbox as soon as possible.  Your physician should have your test results approximately 7 days after the monitor has been mailed back to [facility].   Call [facility] Customer Care at [phone] if you have  questions regarding your ZIO XT patch monitor.  Call them immediately if you see an orange light blinking on your monitor.   If your monitor falls off in less than 4 days contact our Monitor department at [phone].  If your monitor becomes loose or falls off after 4 days call [employer] at [phone] for suggestions on securing your monitor      Follow-Up: At [facility], you and your health needs are our priority.  As part of our continuing mission to provide you with exceptional heart care, we have created  designated Provider Care Teams.  These Care Teams include your primary Cardiologist (physician) and Advanced Practice Providers (APPs -  Physician Assistants and Nurse Practitioners) who all work together to provide you with the care you need, when you need it.  We recommend signing up for the patient portal called MyChart.  Sign up information is provided on this After Visit Summary.  MyChart is used to connect with patients for Virtual Visits (Telemedicine).  Patients are able to view lab/test results, encounter notes, upcoming appointments, etc.  Non-urgent messages can be sent to your provider as well.   To learn more about what you can do with MyChart, go to [email or web address].    Your next appointment:   6 month(s)  Provider:   With any APP   Other Instructions

## 2023-03-14 ENCOUNTER — Ambulatory Visit (HOSPITAL_COMMUNITY): Payer: BC Managed Care – PPO | Attending: Internal Medicine

## 2023-03-14 DIAGNOSIS — I371 Nonrheumatic pulmonary valve insufficiency: Secondary | ICD-10-CM | POA: Insufficient documentation

## 2023-03-14 DIAGNOSIS — R002 Palpitations: Secondary | ICD-10-CM | POA: Diagnosis not present

## 2023-03-14 DIAGNOSIS — Z136 Encounter for screening for cardiovascular disorders: Secondary | ICD-10-CM | POA: Insufficient documentation

## 2023-03-14 LAB — ECHOCARDIOGRAM COMPLETE
Area-P 1/2: 4.06 cm2
S' Lateral: 3.26 cm

## 2023-03-15 ENCOUNTER — Encounter: Payer: Self-pay | Admitting: Internal Medicine

## 2023-04-06 ENCOUNTER — Ambulatory Visit: Payer: BC Managed Care – PPO | Admitting: Orthopedic Surgery

## 2023-04-06 DIAGNOSIS — E559 Vitamin D deficiency, unspecified: Secondary | ICD-10-CM | POA: Diagnosis not present

## 2023-04-06 DIAGNOSIS — R1032 Left lower quadrant pain: Secondary | ICD-10-CM | POA: Diagnosis not present

## 2023-04-06 DIAGNOSIS — R109 Unspecified abdominal pain: Secondary | ICD-10-CM | POA: Diagnosis not present

## 2023-04-06 DIAGNOSIS — R197 Diarrhea, unspecified: Secondary | ICD-10-CM | POA: Diagnosis not present

## 2023-04-06 DIAGNOSIS — R1031 Right lower quadrant pain: Secondary | ICD-10-CM | POA: Diagnosis not present

## 2023-04-21 ENCOUNTER — Ambulatory Visit: Payer: Self-pay

## 2023-04-21 ENCOUNTER — Ambulatory Visit
Admission: EM | Admit: 2023-04-21 | Discharge: 2023-04-21 | Disposition: A | Discharge status: Home / Self Care | Attending: Family Medicine | Admitting: Family Medicine

## 2023-04-21 DIAGNOSIS — K136 Irritative hyperplasia of oral mucosa: Secondary | ICD-10-CM | POA: Diagnosis not present

## 2023-04-21 LAB — POCT RAPID STREP A (OFFICE): Rapid Strep A Screen: NEGATIVE

## 2023-04-21 MED ORDER — LIDOCAINE VISCOUS HCL 2 % MT SOLN: 10.0000 mL | OROMUCOSAL | 0 refills | Status: DC | PRN

## 2023-04-21 MED ORDER — CHLORHEXIDINE GLUCONATE 0.12 % MT SOLN: 15.0000 mL | Freq: Two times a day (BID) | OROMUCOSAL | 0 refills | Status: DC

## 2023-04-21 NOTE — ED Triage Notes (Signed)
 Sore throat, with slight swelling to her tongue that started last night. Pt thinks it may be from a drink she had last night could be a reaction to it.

## 2023-04-21 NOTE — Discharge Instructions (Signed)
 I have sent in a bacterial mouth rinse called chlorhexidine that you may use twice daily until resolved.  You may also use the lidocaine solution either as a dabbing motion or a gargle to help with pain as needed.

## 2023-04-21 NOTE — ED Provider Notes (Signed)
 [facility]    CSN: [id number] Arrival date & time: 04/21/23  1740      History   Chief Complaint Chief Complaint  Patient presents with   Sore Throat    HPI Deanna Hughes is a 33 y.o. female.   Patient presenting today with 1 day history of irritation and slight edema to the top of the tongue particularly posteriorly started last night.  Denies throat itching or difficulty breathing or swallowing, fever, new foods or medications, cough, congestion.  She think she may be reacting from a drink that she had last night.  So far not trying anything over-the-counter for symptoms.    Past Medical History:  Diagnosis Date   Anxiety    BV (bacterial vaginosis) 04/02/2015   Chlamydia    treated 6/6   Chronic back pain    Chronic headaches    MIGRAINES   Contraceptive management 01/25/2013   Depression    Not on meds currently   Encounter for Nexplanon removal 02/27/2015   Irregular bleeding 06/03/2013   Mid back pain 05/17/2018   Miscarriage 03/27/2013   Neck pain 05/17/2018   Nexplanon insertion 04/11/2013   nexplanon inserted 04/11/13 remove 3/32/18   Right inguinal hernia 12/14/2015   Right sided abdominal pain    Scoliosis    Seasonal allergies 07/03/2012   Sinus infection 08/27/2013   Supervision of normal pregnancy 08/27/2015    [facility] at   7+5 weeks  FOB  Dating By  LMP  Pap  GC/CT Initial:                36+wks: Genetic Screen NT/IT:  CF screen  Anatomic [place]  Flu vaccine  Tdap Recommended ~ 28wks Glucose Screen  2 hr GBS  Feed Preference  Contraception  Circumcision  Childbirth Classes  Pediatrician     Threatened abortion in early pregnancy 03/20/2013   Vaginal discharge 10/03/2012   Yeast infection 07/03/2012    Patient Active Problem List   Diagnosis Date Noted   Epicondylitis, medial humeral, right 12/07/2022   Blood in urine 12/07/2022   Palpitations 12/07/2022   Impaired fasting glucose 06/06/2022   Swelling of ankle joint  06/06/2022   Disorder of vitamin B12 05/18/2022   Seasonal allergic rhinitis 05/18/2022   Encounter for well woman exam with routine gynecological exam 03/23/2022   Urinary frequency 03/23/2022   Pain with urination 03/23/2022   Vaginal odor 03/23/2022   Nexplanon in place 03/23/2022   Leg pain, bilateral 09/01/2021   Obesity 08/03/2021   Scoliosis deformity of spine 01/12/2021   Vitamin D deficiency 01/01/2021   Encounter for initial prescription of contraceptive pills 10/19/2020   Leg swelling 07/09/2020   Sore throat 07/09/2020   Decreased libido 03/31/2020   Depo-Provera contraceptive status 03/31/2020   Irregular bleeding 03/31/2020   BV (bacterial vaginosis) 03/31/2020   Rash and nonspecific skin eruption 10/03/2019   Intractable chronic migraine without aura and without status migrainosus 10/03/2019   Umbilical hernia 03/18/2019   Migraine without aura and without status migrainosus, not intractable 02/11/2019   Internal hemorrhoids    Other idiopathic scoliosis, thoracolumbar region 05/17/2018   Dental infection 03/09/2017    Past Surgical History:  Procedure Laterality Date   BIOPSY  09/18/2018   Procedure: BIOPSY;  Surgeon: West Bali, MD;  [facility];  Service: Endoscopy;;   CHOLECYSTECTOMY N/A 01/07/2020   Procedure: LAPAROSCOPIC CHOLECYSTECTOMY;  Surgeon: Quentin Ore, MD;  Location: [facility];  Service: General;  Laterality: N/A;   COLONOSCOPY WITH PROPOFOL N/A 09/18/2018   Procedure: COLONOSCOPY WITH PROPOFOL;  Surgeon: West Bali, MD;  [facility]: [facility];  Service: Endoscopy;  Laterality: N/A;  8:30am   FLEXIBLE SIGMOIDOSCOPY N/A 10/05/2018   Procedure: FLEXIBLE SIGMOIDOSCOPY;  Surgeon: West Bali, MD;  [facility];  Service: Endoscopy;  Laterality: N/A;  8:30   HEMORRHOID BANDING N/A 10/05/2018   Procedure: [place] Lions;  Surgeon: West Bali, MD;  [facility];  Service: Endoscopy;  Laterality:  N/A;   HERNIA REPAIR     INGUINAL HERNIA REPAIR Right 04/19/2017   Procedure: HERNIA REPAIR INGUINAL ADULT;  Surgeon: Earline Mayotte, MD;  Location: [facility];  Service: General;  Laterality: Right;   WISDOM TOOTH EXTRACTION      OB History     Gravida  6   Para  4   Term  4   Preterm  0   AB  2   Living  4      SAB  2   IAB  0   Ectopic  0   Multiple  0   Live Births  4        Obstetric Comments  1st Menstrual Cycle:  15  1st Pregnancy:  18           Home Medications    Prior to Admission medications   Medication Sig Start Date End Date Taking? Authorizing Provider  chlorhexidine (PERIDEX) 0.12 % solution Use as directed 15 mLs in the mouth or throat 2 (two) times daily. 04/21/23  Yes Particia Nearing, PA-C  lidocaine (XYLOCAINE) 2 % solution Use as directed 10 mLs in the mouth or throat every 3 (three) hours as needed. 04/21/23  Yes Particia Nearing, PA-C  etonogestrel (NEXPLANON) 68 MG IMPL implant 1 each by Subdermal route once.    [provider]  predniSONE (DELTASONE) 10 MG tablet Take 1 tablet (10 mg total) by mouth 3 (three) times daily. 01/20/23   Vickki Hearing, MD  Vitamin D, Ergocalciferol, (DRISDOL) 1.25 MG (50000 UNIT) CAPS capsule Take 50,000 Units by mouth once a week. 01/01/21   [provider]    Family History Family History  Problem Relation Age of Onset   Asthma Mother    Asthma Sister    Asthma Brother    Asthma Daughter    Diabetes Maternal Grandmother    Hypertension Maternal Grandmother    Asthma Maternal Grandmother    Heart disease Maternal Grandmother    Stroke Maternal Grandmother    Hypertension Maternal Grandfather    Asthma Maternal Grandfather    Heart disease Maternal Grandfather    Asthma Son    Colon cancer Neg Hx    Colon polyps Neg Hx     Social History Social History   Tobacco Use   Smoking status: Never    Passive exposure: Never   Smokeless tobacco: Never  Vaping  Use   Vaping status: Never Used  Substance Use Topics   Alcohol use: Not Currently   Drug use: No     Allergies   Aspirin, Advil [ibuprofen], and Latex   Review of Systems Review of Systems Per HPI  Physical Exam Triage Vital Signs ED Triage Vitals  Encounter Vitals Group     BP 04/21/23 1834 112/68     Systolic BP Percentile --      Diastolic BP Percentile --      Pulse  Rate 04/21/23 1834 92     Resp 04/21/23 1834 16     Temp 04/21/23 1834 98.4 F (36.9 C)     Temp Source 04/21/23 1834 Oral     SpO2 04/21/23 1834 99 %     Weight --      Height --      Head Circumference --      Peak Flow --      Pain Score 04/21/23 1839 7     Pain Loc --      Pain Education --      Exclude from Growth Chart --    No data found.  Updated Vital Signs BP 112/68 (BP Location: Right Arm)   Pulse 92   Temp 98.4 F (36.9 C) (Oral)   Resp 16   LMP 03/29/2023 (Approximate)   SpO2 99%   Visual Acuity Right Eye Distance:   Left Eye Distance:   Bilateral Distance:    Right Eye Near:   Left Eye Near:    Bilateral Near:     Physical Exam Vitals and nursing note reviewed.  Constitutional:      Appearance: Normal appearance. She is not ill-appearing.  HENT:     Head: Atraumatic.     Mouth/Throat:     Mouth: Mucous membranes are moist.     Pharynx: Oropharynx is clear.     Comments: Posterior aspect of the tongue mildly erythematous, taste buds in this area appear irritated and erythematous.  Oral airway patent, no tonsillar edema or erythema Eyes:     Extraocular Movements: Extraocular movements intact.     Conjunctiva/sclera: Conjunctivae normal.  Cardiovascular:     Rate and Rhythm: Normal rate and regular rhythm.     Heart sounds: Normal heart sounds.  Pulmonary:     Effort: Pulmonary effort is normal.     Breath sounds: Normal breath sounds.  Musculoskeletal:        General: Normal range of motion.     Cervical back: Normal range of motion and neck supple.  Skin:     General: Skin is warm and dry.  Neurological:     Mental Status: She is alert and oriented to person, place, and time.     Motor: No weakness.     Gait: Gait normal.  Psychiatric:        Mood and Affect: Mood normal.        Thought Content: Thought content normal.        Judgment: Judgment normal.      UC Treatments / Results  Labs (all labs ordered are listed, but only abnormal results are displayed) Labs Reviewed  POCT RAPID STREP A (OFFICE)    EKG   Radiology No results found.  Procedures Procedures (including critical care time)  Medications Ordered in UC Medications - No data to display  Initial Impression / Assessment and Plan / UC Course  I have reviewed the triage vital signs and the nursing notes.  Pertinent labs & imaging results that were available during my care of the patient were reviewed by me and considered in my medical decision making (see chart for details).     No evidence of a bacterial infection today, appears more to be irritation possibly from something that she ate.  Treat with Peridex rinse, viscous lidocaine, supportive over-the-counter medications and home care.  Rapid strep negative.  Return for worsening symptoms.  Final Clinical Impressions(s) / UC Diagnoses   Final diagnoses:  Irritation of oral cavity  Discharge Instructions      I have sent in a bacterial mouth rinse called chlorhexidine that you may use twice daily until resolved.  You may also use the lidocaine solution either as a dabbing motion or a gargle to help with pain as needed.    ED Prescriptions     Medication Sig Dispense Auth. Provider   chlorhexidine (PERIDEX) 0.12 % solution Use as directed 15 mLs in the mouth or throat 2 (two) times daily. 120 mL Particia Nearing, PA-C   lidocaine (XYLOCAINE) 2 % solution Use as directed 10 mLs in the mouth or throat every 3 (three) hours as needed. 100 mL Particia Nearing, [place]      PDMP not reviewed  this encounter.   Particia Nearing, [place] 04/21/23 1912

## 2023-04-23 NOTE — Progress Notes (Unsigned)
 GI Office Note    Referring Provider: Benita Stabile, MD Primary Care Physician:  Benita Stabile, MD  Primary Gastroenterologist: Hennie Duos. Marletta Lor, DO   Chief Complaint   No chief complaint on file.    History of Present Illness   Deanna Hughes is a 33 y.o. female presenting today at the request of Dr. Margo Aye for diarrhea.   Labs ***    Flex sig 09/2018: -perianal skin tags found on perianal exam -bleeding internal hemorrhoids s/p banding  Colonosocpy 09/2018: -proctosigmoiditis*** -tortuous left colon -external and internal hemorrhoids  Medications   Current Outpatient Medications  Medication Sig Dispense Refill   chlorhexidine (PERIDEX) 0.12 % solution Use as directed 15 mLs in the mouth or throat 2 (two) times daily. 120 mL 0   etonogestrel (NEXPLANON) 68 MG IMPL implant 1 each by Subdermal route once.     lidocaine (XYLOCAINE) 2 % solution Use as directed 10 mLs in the mouth or throat every 3 (three) hours as needed. 100 mL 0   predniSONE (DELTASONE) 10 MG tablet Take 1 tablet (10 mg total) by mouth 3 (three) times daily. 90 tablet 1   Vitamin D, Ergocalciferol, (DRISDOL) 1.25 MG (50000 UNIT) CAPS capsule Take 50,000 Units by mouth once a week.     No current facility-administered medications for this visit.    Allergies   Allergies as of 04/24/2023 - Review Complete 04/21/2023  Allergen Reaction Noted   Aspirin Shortness Of Breath and Other (See Comments) 04/17/2017   Advil [ibuprofen] Other (See Comments) 12/05/2016   Latex Rash 06/20/2018    Past Medical History   Past Medical History:  Diagnosis Date   Anxiety    BV (bacterial vaginosis) 04/02/2015   Chlamydia    treated 6/6   Chronic back pain    Chronic headaches    MIGRAINES   Contraceptive management 01/25/2013   Depression    Not on meds currently   Encounter for Nexplanon removal 02/27/2015   Irregular bleeding 06/03/2013   Mid back pain 05/17/2018   Miscarriage 03/27/2013   Neck pain  05/17/2018   Nexplanon insertion 04/11/2013   nexplanon inserted 04/11/13 remove 3/32/18   Right inguinal hernia 12/14/2015   Right sided abdominal pain    Scoliosis    Seasonal allergies 07/03/2012   Sinus infection 08/27/2013   Supervision of normal pregnancy 08/27/2015    [facility] at   7+5 weeks  FOB  Dating By  LMP  Pap  GC/CT Initial:                36+wks: Genetic Screen NT/IT:  CF screen  Anatomic [place]  Flu vaccine  Tdap Recommended ~ 28wks Glucose Screen  2 hr GBS  Feed Preference  Contraception  Circumcision  Childbirth Classes  Pediatrician     Threatened abortion in early pregnancy 03/20/2013   Vaginal discharge 10/03/2012   Yeast infection 07/03/2012    Past Surgical History   Past Surgical History:  Procedure Laterality Date   BIOPSY  09/18/2018   Procedure: BIOPSY;  Surgeon: West Bali, MD;  [facility];  Service: Endoscopy;;   CHOLECYSTECTOMY N/A 01/07/2020   Procedure: LAPAROSCOPIC CHOLECYSTECTOMY;  Surgeon: Quentin Ore, MD;  Location: [facility];  Service: General;  Laterality: N/A;   COLONOSCOPY WITH PROPOFOL N/A 09/18/2018   Procedure: COLONOSCOPY WITH PROPOFOL;  Surgeon: West Bali, MD;  [facility];  Service: Endoscopy;  Laterality: N/A;  8:30am   FLEXIBLE SIGMOIDOSCOPY N/A 10/05/2018   Procedure: FLEXIBLE SIGMOIDOSCOPY;  Surgeon: West Bali, MD;  [facility];  Service: Endoscopy;  Laterality: N/A;  8:30   HEMORRHOID BANDING N/A 10/05/2018   Procedure: [place] Lions;  Surgeon: West Bali, MD;  [facility];  Service: Endoscopy;  Laterality: N/A;   HERNIA REPAIR     INGUINAL HERNIA REPAIR Right 04/19/2017   Procedure: HERNIA REPAIR INGUINAL ADULT;  Surgeon: Earline Mayotte, MD;  Location: [facility];  Service: General;  Laterality: Right;   WISDOM TOOTH EXTRACTION      Past Family History   Family History  Problem Relation Age of Onset   Asthma Mother    Asthma Sister     Asthma Brother    Asthma Daughter    Diabetes Maternal Grandmother    Hypertension Maternal Grandmother    Asthma Maternal Grandmother    Heart disease Maternal Grandmother    Stroke Maternal Grandmother    Hypertension Maternal Grandfather    Asthma Maternal Grandfather    Heart disease Maternal Grandfather    Asthma Son    Colon cancer Neg Hx    Colon polyps Neg Hx     Past Social History   Social History   Socioeconomic History   Marital status: Single    Spouse name: Not on file   Number of children: 4   Years of education: Not on file   Highest education level: High school graduate  Occupational History    Comment: Estate manager/land agent, cafeteria  Tobacco Use   Smoking status: Never    Passive exposure: Never   Smokeless tobacco: Never  Vaping Use   Vaping status: Never Used  Substance and Sexual Activity   Alcohol use: Not Currently   Drug use: No   Sexual activity: Yes    Birth control/protection: Implant  Other Topics Concern   Not on file  Social History Narrative   Lives with her 3 children      Zimiah (8)    Zatavious (6)      Zayla (1)- Daddy is present in life.       Social Drivers of Corporate investment banker Strain: Low Risk  (03/23/2022)   Overall Financial Resource Strain (CARDIA)    Difficulty of Paying Living Expenses: Not hard at all  Food Insecurity: No Food Insecurity (03/23/2022)   Hunger Vital Sign    Worried About Running Out of Food in the Last Year: Never true    Ran Out of Food in the Last Year: Never true  Transportation Needs: No Transportation Needs (03/23/2022)   PRAPARE - Administrator, Civil Service (Medical): No    Lack of Transportation (Non-Medical): No  Physical Activity: Sufficiently Active (03/23/2022)   Exercise Vital Sign    Days of Exercise per Week: 5 days    Minutes of Exercise per Session: 50 min  Stress: No Stress Concern Present (03/23/2022)   [employer] of Occupational Health - Occupational Stress  Questionnaire    Feeling of Stress : Only a little  Social Connections: Socially Isolated (03/23/2022)   Social Connection and Isolation Panel [NHANES]    Frequency of Communication with Friends and Family: More than three times a week    Frequency of Social Gatherings with Friends and Family: More than three times a week    Attends Religious Services: Never    Database administrator or Organizations: No    Attends  Club or Organization Meetings: Never    Marital Status: Never married  Intimate Partner Violence: Not At Risk (03/23/2022)   Humiliation, Afraid, Rape, and Kick questionnaire    Fear of Current or Ex-Partner: No    Emotionally Abused: No    Physically Abused: No    Sexually Abused: No    Review of Systems   General: Negative for anorexia, weight loss, fever, chills, fatigue, weakness. Eyes: Negative for vision changes.  ENT: Negative for hoarseness, difficulty swallowing , nasal congestion. CV: Negative for chest pain, angina, palpitations, dyspnea on exertion, peripheral edema.  Respiratory: Negative for dyspnea at rest, dyspnea on exertion, cough, sputum, wheezing.  GI: See history of present illness. GU:  Negative for dysuria, hematuria, urinary incontinence, urinary frequency, nocturnal urination.  MS: Negative for joint pain, low back pain.  Derm: Negative for rash or itching.  Neuro: Negative for weakness, abnormal sensation, seizure, frequent headaches, memory loss,  confusion.  Psych: Negative for anxiety, depression, suicidal ideation, hallucinations.  Endo: Negative for unusual weight change.  Heme: Negative for bruising or bleeding. Allergy: Negative for rash or hives.  Physical Exam   LMP 03/29/2023 (Approximate)    General: Well-nourished, well-developed in no acute distress.  Head: Normocephalic, atraumatic.   Eyes: Conjunctiva pink, no icterus. Mouth: Oropharyngeal mucosa moist and pink , no lesions erythema or exudate. Neck: Supple without  thyromegaly, masses, or lymphadenopathy.  Lungs: Clear to auscultation bilaterally.  Heart: Regular rate and rhythm, no murmurs rubs or gallops.  Abdomen: Bowel sounds are normal, nontender, nondistended, no hepatosplenomegaly or masses,  no abdominal bruits or hernia, no rebound or guarding.   Rectal: *** Extremities: No lower extremity edema. No clubbing or deformities.  Neuro: Alert and oriented x 4 , grossly normal neurologically.  Skin: Warm and dry, no rash or jaundice.   Psych: Alert and cooperative, normal mood and affect.  Labs   *** Imaging Studies   No results found.  Assessment       PLAN   ***   Leanna Battles. Melvyn Neth, MHS, PA-C [facility]

## 2023-04-24 ENCOUNTER — Ambulatory Visit (INDEPENDENT_AMBULATORY_CARE_PROVIDER_SITE_OTHER): Admitting: Gastroenterology

## 2023-04-24 ENCOUNTER — Encounter: Payer: Self-pay | Admitting: Gastroenterology

## 2023-04-24 VITALS — BP 103/67 | HR 76 | Temp 99.1°F | Ht 62.0 in | Wt 191.2 lb

## 2023-04-24 DIAGNOSIS — R1084 Generalized abdominal pain: Secondary | ICD-10-CM

## 2023-04-24 DIAGNOSIS — R197 Diarrhea, unspecified: Secondary | ICD-10-CM | POA: Insufficient documentation

## 2023-04-24 DIAGNOSIS — K649 Unspecified hemorrhoids: Secondary | ICD-10-CM | POA: Diagnosis not present

## 2023-04-24 DIAGNOSIS — R109 Unspecified abdominal pain: Secondary | ICD-10-CM | POA: Diagnosis not present

## 2023-04-24 NOTE — Patient Instructions (Signed)
 Complete labs and stool tests. We will be in touch with results as available.

## 2023-04-25 ENCOUNTER — Encounter: Payer: Self-pay | Admitting: Gastroenterology

## 2023-05-25 ENCOUNTER — Other Ambulatory Visit (HOSPITAL_COMMUNITY)
Admission: RE | Admit: 2023-05-25 | Discharge: 2023-05-25 | Disposition: A | Discharge status: Home / Self Care | Source: Ambulatory Visit | Attending: Gastroenterology | Admitting: Gastroenterology

## 2023-05-25 DIAGNOSIS — R1084 Generalized abdominal pain: Secondary | ICD-10-CM | POA: Insufficient documentation

## 2023-05-25 DIAGNOSIS — R197 Diarrhea, unspecified: Secondary | ICD-10-CM | POA: Insufficient documentation

## 2023-05-25 LAB — CBC WITH DIFFERENTIAL/PLATELET
Abs Immature Granulocytes: 0.05 10*3/uL (ref 0.00–0.07)
Basophils Absolute: 0 10*3/uL (ref 0.0–0.1)
Basophils Relative: 0 %
Eosinophils Absolute: 0.1 10*3/uL (ref 0.0–0.5)
Eosinophils Relative: 1 %
HCT: 39.1 % (ref 36.0–46.0)
Hemoglobin: 12.8 g/dL (ref 12.0–15.0)
Immature Granulocytes: 1 %
Lymphocytes Relative: 25 %
Lymphs Abs: 2.2 10*3/uL (ref 0.7–4.0)
MCH: 28.8 pg (ref 26.0–34.0)
MCHC: 32.7 g/dL (ref 30.0–36.0)
MCV: 87.9 fL (ref 80.0–100.0)
Monocytes Absolute: 0.4 10*3/uL (ref 0.1–1.0)
Monocytes Relative: 4 %
Neutro Abs: 6.1 10*3/uL (ref 1.7–7.7)
Neutrophils Relative %: 69 %
Platelets: 220 10*3/uL (ref 150–400)
RBC: 4.45 MIL/uL (ref 3.87–5.11)
RDW: 12.3 % (ref 11.5–15.5)
WBC: 8.8 10*3/uL (ref 4.0–10.5)
nRBC: 0 % (ref 0.0–0.2)

## 2023-05-25 LAB — COMPREHENSIVE METABOLIC PANEL WITH GFR
ALT: 14 U/L (ref 0–44)
AST: 14 U/L — ABNORMAL LOW (ref 15–41)
Albumin: 4.2 g/dL (ref 3.5–5.0)
Alkaline Phosphatase: 72 U/L (ref 38–126)
Anion gap: 8 (ref 5–15)
BUN: 10 mg/dL (ref 6–20)
CO2: 23 mmol/L (ref 22–32)
Calcium: 9.5 mg/dL (ref 8.9–10.3)
Chloride: 103 mmol/L (ref 98–111)
Creatinine, Ser: 0.63 mg/dL (ref 0.44–1.00)
GFR, Estimated: >60 mL/min (ref >60)
Glucose, Bld: 110 mg/dL — ABNORMAL HIGH (ref 70–99)
Potassium: 3.8 mmol/L (ref 3.5–5.1)
Sodium: 134 mmol/L — ABNORMAL LOW (ref 135–145)
Total Bilirubin: 0.5 mg/dL (ref 0.0–1.2)
Total Protein: 7.1 g/dL (ref 6.5–8.1)

## 2023-05-25 LAB — T4, FREE: Free T4: 0.81 ng/dL (ref 0.61–1.12)

## 2023-05-25 LAB — LIPASE, BLOOD: Lipase: 31 U/L (ref 11–51)

## 2023-05-25 LAB — TSH: TSH: 1.424 u[IU]/mL (ref 0.350–4.500)

## 2023-05-25 LAB — SEDIMENTATION RATE: Sed Rate: 10 mm/h (ref 0–22)

## 2023-05-25 LAB — C-REACTIVE PROTEIN: CRP: 1.2 mg/dL — ABNORMAL HIGH (ref <1.0)

## 2023-05-26 DIAGNOSIS — R1084 Generalized abdominal pain: Secondary | ICD-10-CM

## 2023-05-26 DIAGNOSIS — R197 Diarrhea, unspecified: Secondary | ICD-10-CM

## 2023-05-26 LAB — IGA: IgA: 171 mg/dL (ref 87–352)

## 2023-05-26 NOTE — Telephone Encounter (Signed)
 Please schedule CT A/P with contrast for RUQ pain, bulging and generalized abdominal pain, diarrhea.

## 2023-05-26 NOTE — Addendum Note (Signed)
 Addended by: Feliz Hosteller on: 05/26/2023 11:46 AM   Modules accepted: Orders

## 2023-05-26 NOTE — Telephone Encounter (Signed)
 PA approved via [employer]lon Order ID: [id number]       Authorized  Approval Valid Through: 05/26/2023 - 07/24/2023

## 2023-05-27 LAB — TISSUE TRANSGLUTAMINASE, IGA: Tissue Transglutaminase Ab, IgA: <2 U/mL (ref 0–3)

## 2023-06-02 ENCOUNTER — Ambulatory Visit (HOSPITAL_COMMUNITY)
Admission: RE | Admit: 2023-06-02 | Discharge: 2023-06-02 | Disposition: A | Discharge status: Home / Self Care | Source: Ambulatory Visit | Attending: Gastroenterology | Admitting: Gastroenterology

## 2023-06-02 ENCOUNTER — Encounter (HOSPITAL_COMMUNITY): Payer: Self-pay | Admitting: Radiology

## 2023-06-02 DIAGNOSIS — R197 Diarrhea, unspecified: Secondary | ICD-10-CM | POA: Insufficient documentation

## 2023-06-02 DIAGNOSIS — R1084 Generalized abdominal pain: Secondary | ICD-10-CM | POA: Insufficient documentation

## 2023-06-02 DIAGNOSIS — Z9049 Acquired absence of other specified parts of digestive tract: Secondary | ICD-10-CM | POA: Diagnosis not present

## 2023-06-02 DIAGNOSIS — R1011 Right upper quadrant pain: Secondary | ICD-10-CM | POA: Diagnosis not present

## 2023-06-02 MED ORDER — IOHEXOL 300 MG/ML  SOLN
100.0000 mL | Freq: Once | INTRAMUSCULAR | Status: AC | PRN
  Administered 2023-06-02: 100 mL via INTRAVENOUS

## 2023-06-05 ENCOUNTER — Other Ambulatory Visit: Payer: Self-pay | Admitting: Gastroenterology

## 2023-06-05 ENCOUNTER — Ambulatory Visit: Payer: Self-pay | Admitting: Gastroenterology

## 2023-06-05 MED ORDER — COLESTIPOL HCL 1 G PO TABS: 2.0000 g | ORAL_TABLET | Freq: Every day | ORAL | 11 refills | Status: DC

## 2023-06-05 NOTE — Telephone Encounter (Signed)
 Pt was made aware and verbalized understanding.

## 2023-07-04 DIAGNOSIS — Z0000 Encounter for general adult medical examination without abnormal findings: Secondary | ICD-10-CM | POA: Diagnosis not present

## 2023-07-11 ENCOUNTER — Ambulatory Visit

## 2023-07-11 DIAGNOSIS — E669 Obesity, unspecified: Secondary | ICD-10-CM | POA: Diagnosis not present

## 2023-07-11 DIAGNOSIS — Z23 Encounter for immunization: Secondary | ICD-10-CM | POA: Diagnosis not present

## 2023-07-11 DIAGNOSIS — Z0000 Encounter for general adult medical examination without abnormal findings: Secondary | ICD-10-CM | POA: Diagnosis not present

## 2023-07-11 DIAGNOSIS — M25542 Pain in joints of left hand: Secondary | ICD-10-CM | POA: Diagnosis not present

## 2023-07-11 DIAGNOSIS — E538 Deficiency of other specified B group vitamins: Secondary | ICD-10-CM | POA: Diagnosis not present

## 2023-07-11 DIAGNOSIS — E559 Vitamin D deficiency, unspecified: Secondary | ICD-10-CM | POA: Diagnosis not present

## 2023-07-20 DIAGNOSIS — M25542 Pain in joints of left hand: Secondary | ICD-10-CM | POA: Diagnosis not present

## 2023-07-28 ENCOUNTER — Other Ambulatory Visit: Payer: Self-pay | Admitting: *Deleted

## 2023-07-28 DIAGNOSIS — K429 Umbilical hernia without obstruction or gangrene: Secondary | ICD-10-CM

## 2023-08-03 ENCOUNTER — Encounter: Payer: Self-pay | Admitting: General Surgery

## 2023-08-03 ENCOUNTER — Ambulatory Visit (INDEPENDENT_AMBULATORY_CARE_PROVIDER_SITE_OTHER): Admitting: General Surgery

## 2023-08-03 VITALS — BP 112/74 | HR 82 | Temp 98.3°F | Resp 16 | Ht 62.0 in | Wt 191.0 lb

## 2023-08-03 DIAGNOSIS — K439 Ventral hernia without obstruction or gangrene: Secondary | ICD-10-CM

## 2023-08-03 NOTE — Progress Notes (Signed)
 Deanna Hughes; [id number]; [birth year]   HPI Patient is a 33 year old black female who was referred to my care by Dr. Shona for evaluation treatment of a ventral hernia.  Patient states she has recently noticed worsening swelling with straining or when sitting up along the upper portion of the midline of her abdomen.  It does cause her pain when it is protruding and she is putting pressure on it.  She denies any nausea or vomiting.  She is also status post a laparoscopic cholecystectomy in the past. Past Medical History:  Diagnosis Date   Anxiety    BV (bacterial vaginosis) 04/02/2015   Chlamydia    treated 6/6   Chronic back pain    Chronic headaches    MIGRAINES   Contraceptive management 01/25/2013   Depression    Not on meds currently   Encounter for Nexplanon  removal 02/27/2015   Irregular bleeding 06/03/2013   Mid back pain 05/17/2018   Miscarriage 03/27/2013   Neck pain 05/17/2018   Nexplanon  insertion 04/11/2013   nexplanon  inserted 04/11/13 remove 3/32/18   Right inguinal hernia 12/14/2015   Right sided abdominal pain    Scoliosis    Seasonal allergies 07/03/2012   Sinus infection 08/27/2013   Supervision of normal pregnancy 08/27/2015    [facility] at   7+5 weeks  FOB  Dating By  LMP  Pap  GC/CT Initial:                36+wks: Genetic Screen NT/IT:  CF screen  Anatomic US   Flu vaccine  Tdap Recommended ~ 28wks Glucose Screen  2 hr GBS  Feed Preference  Contraception  Circumcision  Childbirth Classes  Pediatrician     Threatened abortion in early pregnancy 03/20/2013   Vaginal discharge 10/03/2012   Yeast infection 07/03/2012    Past Surgical History:  Procedure Laterality Date   BIOPSY  09/18/2018   Procedure: BIOPSY;  Surgeon: Harvey Margo LITTIE, MD;  [facility];  Service: Endoscopy;;   CHOLECYSTECTOMY N/A 01/07/2020   Procedure: LAPAROSCOPIC CHOLECYSTECTOMY;  Surgeon: Lyndel Deward PARAS, MD;  Location: [facility];  Service: General;  Laterality: N/A;    COLONOSCOPY WITH PROPOFOL  N/A 09/18/2018   Procedure: COLONOSCOPY WITH PROPOFOL ;  Surgeon: Harvey Margo LITTIE, MD;  [facility];  Service: Endoscopy;  Laterality: N/A;  8:30am   FLEXIBLE SIGMOIDOSCOPY N/A 10/05/2018   Procedure: FLEXIBLE SIGMOIDOSCOPY;  Surgeon: Harvey Margo LITTIE, MD;  [facility];  Service: Endoscopy;  Laterality: N/A;  8:30   HEMORRHOID BANDING N/A 10/05/2018   Procedure: GLENICE CAI;  Surgeon: Harvey Margo LITTIE, MD;  [facility];  Service: Endoscopy;  Laterality: N/A;   HERNIA REPAIR     INGUINAL HERNIA REPAIR Right 04/19/2017   Procedure: HERNIA REPAIR INGUINAL ADULT;  Surgeon: Dessa Reyes ORN, MD;  Location: [facility];  Service: General;  Laterality: Right;   WISDOM TOOTH EXTRACTION      Family History  Problem Relation Age of Onset   Asthma Mother    Asthma Sister    Asthma Brother    Diabetes Maternal Grandmother    Hypertension Maternal Grandmother    Asthma Maternal Grandmother    Heart disease Maternal Grandmother    Stroke Maternal Grandmother    Hypertension Maternal Grandfather    Asthma Maternal Grandfather    Heart disease Maternal Grandfather    Asthma Daughter    Asthma Son    Colon cancer Neg Hx  Colon polyps Neg Hx    Celiac disease Neg Hx    Inflammatory bowel disease Neg Hx     Current Outpatient Medications on File Prior to Visit  Medication Sig Dispense Refill   chlorhexidine  (PERIDEX ) 0.12 % solution Use as directed 15 mLs in the mouth or throat 2 (two) times daily. 120 mL 0   colestipol  (COLESTID ) 1 g tablet Take 2 tablets (2 g total) by mouth daily. Do not take within 3 hours of other medications. 60 tablet 11   etonogestrel  (NEXPLANON ) 68 MG IMPL implant 1 each by Subdermal route once.     Vitamin D, Ergocalciferol, (DRISDOL) 1.25 MG (50000 UNIT) CAPS capsule Take 50,000 Units by mouth once a week.     No current facility-administered medications on file prior to visit.    Allergies  Allergen  Reactions   Aspirin Shortness Of Breath and Other (See Comments)    Heart beats fast   Advil  [Ibuprofen ] Other (See Comments)    Breakout, heart palpitations and trouble breathing.    Latex Rash    Social History   Substance and Sexual Activity  Alcohol Use Not Currently    Social History   Tobacco Use  Smoking Status Never   Passive exposure: Never  Smokeless Tobacco Never    Review of Systems  Constitutional: Negative.   HENT: Negative.    Eyes: Negative.   Respiratory: Negative.    Cardiovascular: Negative.   Gastrointestinal:  Positive for abdominal pain.  Genitourinary: Negative.   Musculoskeletal:  Positive for back pain.  Skin: Negative.   Neurological: Negative.   Endo/Heme/Allergies: Negative.   Psychiatric/Behavioral: Negative.      Objective   Vitals:   08/03/23 0944  BP: 112/74  Pulse: 82  Resp: 16  Temp: 98.3 F (36.8 C)  SpO2: 97%    Physical Exam Vitals reviewed.  Constitutional:      Appearance: Normal appearance. She is not ill-appearing.  HENT:     Head: Normocephalic and atraumatic.  Cardiovascular:     Rate and Rhythm: Normal rate and regular rhythm.     Heart sounds: Normal heart sounds. No murmur heard.    No friction rub. No gallop.  Pulmonary:     Effort: Pulmonary effort is normal. No respiratory distress.     Breath sounds: Normal breath sounds. No wheezing, rhonchi or rales.  Abdominal:     General: Bowel sounds are normal. There is no distension.     Palpations: Abdomen is soft. There is no mass.     Tenderness: There is no abdominal tenderness. There is no guarding or rebound.     Hernia: A hernia is present.     Comments: Patient does have a greater than 4 cm swelling in the midline of the upper abdomen.  A small umbilical hernia is also present deep to a supraumbilical surgical scar.  Skin:    General: Skin is warm and dry.  Neurological:     Mental Status: She is alert and oriented to person, place, and time.    CT scan images personally reviewed  Assessment  Ventral hernia x 2 Plan  Patient is scheduled for robotic assisted laparoscopic ventral herniorrhaphy with mesh on 09/25/2023.  The risks and benefits of the procedure including bleeding, infection, mesh use, and the possibility of recurrence of the hernia were fully explained to the patient, who gave informed consent.

## 2023-08-07 NOTE — Addendum Note (Signed)
 Addended by: SAUNDRA TAWNI DEL on: 08/07/2023 12:37 PM   Modules accepted: Orders

## 2023-09-04 NOTE — H&P (Addendum)
 Deanna Hughes; [id number]; 10/26/1990   HPI Patient is a 33 year old black female who was referred to my care by Dr. Shona for evaluation treatment of a ventral hernia.  Patient states she has recently noticed worsening swelling with straining or when sitting up along the upper portion of the midline of her abdomen.  It does cause her pain when it is protruding and she is putting pressure on it.  She denies any nausea or vomiting.  She is also status post a laparoscopic cholecystectomy in the past. Past Medical History:  Diagnosis Date   Anxiety    BV (bacterial vaginosis) 04/02/2015   Chlamydia    treated 6/6   Chronic back pain    Chronic headaches    MIGRAINES   Contraceptive management 01/25/2013   Depression    Not on meds currently   Encounter for Nexplanon  removal 02/27/2015   Irregular bleeding 06/03/2013   Mid back pain 05/17/2018   Miscarriage 03/27/2013   Neck pain 05/17/2018   Nexplanon  insertion 04/11/2013   nexplanon  inserted 04/11/13 remove 3/32/18   Right inguinal hernia 12/14/2015   Right sided abdominal pain    Scoliosis    Seasonal allergies 07/03/2012   Sinus infection 08/27/2013   Supervision of normal pregnancy 08/27/2015    [facility] at   7+5 weeks  FOB  Dating By  LMP  Pap  GC/CT Initial:                36+wks: Genetic Screen NT/IT:  CF screen  Anatomic US   Flu vaccine  Tdap Recommended ~ 28wks Glucose Screen  2 hr GBS  Feed Preference  Contraception  Circumcision  Childbirth Classes  Pediatrician     Threatened abortion in early pregnancy 03/20/2013   Vaginal discharge 10/03/2012   Yeast infection 07/03/2012    Past Surgical History:  Procedure Laterality Date   BIOPSY  09/18/2018   Procedure: BIOPSY;  Surgeon: Harvey Margo LITTIE, MD;  [facility];  Service: Endoscopy;;   CHOLECYSTECTOMY N/A 01/07/2020   Procedure: LAPAROSCOPIC CHOLECYSTECTOMY;  Surgeon: Lyndel Deward PARAS, MD;  Location: [facility];  Service: General;  Laterality: N/A;    COLONOSCOPY WITH PROPOFOL  N/A 09/18/2018   Procedure: COLONOSCOPY WITH PROPOFOL ;  Surgeon: Harvey Margo LITTIE, MD;  [facility];  Service: Endoscopy;  Laterality: N/A;  8:30am   FLEXIBLE SIGMOIDOSCOPY N/A 10/05/2018   Procedure: FLEXIBLE SIGMOIDOSCOPY;  Surgeon: Harvey Margo LITTIE, MD;  [facility];  Service: Endoscopy;  Laterality: N/A;  8:30   HEMORRHOID BANDING N/A 10/05/2018   Procedure: GLENICE CAI;  Surgeon: Harvey Margo LITTIE, MD;  [facility];  Service: Endoscopy;  Laterality: N/A;   HERNIA REPAIR     INGUINAL HERNIA REPAIR Right 04/19/2017   Procedure: HERNIA REPAIR INGUINAL ADULT;  Surgeon: Dessa Reyes ORN, MD;  Location: [facility];  Service: General;  Laterality: Right;   WISDOM TOOTH EXTRACTION      Family History  Problem Relation Age of Onset   Asthma Mother    Asthma Sister    Asthma Brother    Diabetes Maternal Grandmother    Hypertension Maternal Grandmother    Asthma Maternal Grandmother    Heart disease Maternal Grandmother    Stroke Maternal Grandmother    Hypertension Maternal Grandfather    Asthma Maternal Grandfather    Heart disease Maternal Grandfather    Asthma Daughter    Asthma Son    Colon cancer Neg Hx  Colon polyps Neg Hx    Celiac disease Neg Hx    Inflammatory bowel disease Neg Hx     Current Outpatient Medications on File Prior to Visit  Medication Sig Dispense Refill   chlorhexidine  (PERIDEX ) 0.12 % solution Use as directed 15 mLs in the mouth or throat 2 (two) times daily. 120 mL 0   colestipol  (COLESTID ) 1 g tablet Take 2 tablets (2 g total) by mouth daily. Do not take within 3 hours of other medications. 60 tablet 11   etonogestrel  (NEXPLANON ) 68 MG IMPL implant 1 each by Subdermal route once.     Vitamin D, Ergocalciferol, (DRISDOL) 1.25 MG (50000 UNIT) CAPS capsule Take 50,000 Units by mouth once a week.     No current facility-administered medications on file prior to visit.    Allergies  Allergen  Reactions   Aspirin Shortness Of Breath and Other (See Comments)    Heart beats fast   Advil  [Ibuprofen ] Other (See Comments)    Breakout, heart palpitations and trouble breathing.    Latex Rash    Social History   Substance and Sexual Activity  Alcohol Use Not Currently    Social History   Tobacco Use  Smoking Status Never   Passive exposure: Never  Smokeless Tobacco Never    Review of Systems  Constitutional: Negative.   HENT: Negative.    Eyes: Negative.   Respiratory: Negative.    Cardiovascular: Negative.   Gastrointestinal:  Positive for abdominal pain.  Genitourinary: Negative.   Musculoskeletal:  Positive for back pain.  Skin: Negative.   Neurological: Negative.   Endo/Heme/Allergies: Negative.   Psychiatric/Behavioral: Negative.      Objective   Vitals:   08/03/23 0944  BP: 112/74  Pulse: 82  Resp: 16  Temp: 98.3 F (36.8 C)  SpO2: 97%    Physical Exam Vitals reviewed.  Constitutional:      Appearance: Normal appearance. She is not ill-appearing.  HENT:     Head: Normocephalic and atraumatic.  Cardiovascular:     Rate and Rhythm: Normal rate and regular rhythm.     Heart sounds: Normal heart sounds. No murmur heard.    No friction rub. No gallop.  Pulmonary:     Effort: Pulmonary effort is normal. No respiratory distress.     Breath sounds: Normal breath sounds. No wheezing, rhonchi or rales.  Abdominal:     General: Bowel sounds are normal. There is no distension.     Palpations: Abdomen is soft. There is no mass.     Tenderness: There is no abdominal tenderness. There is no guarding or rebound.     Hernia: A hernia is present.     Comments: Patient does have a greater than 4 cm swelling in the midline of the upper abdomen.  A small umbilical hernia is also present deep to a supraumbilical surgical scar.  Skin:    General: Skin is warm and dry.  Neurological:     Mental Status: She is alert and oriented to person, place, and time.    CT scan images personally reviewed  Assessment  Ventral hernia x 2 Plan  Patient is scheduled for robotic assisted laparoscopic ventral herniorrhaphy with mesh on 09/25/2023.  The risks and benefits of the procedure including bleeding, infection, mesh use, and the possibility of recurrence of the hernia were fully explained to the patient, who gave informed consent. Addendum:  Patient prefers a ventral herniorrhaphy with mesh.

## 2023-09-19 NOTE — Patient Instructions (Signed)
 Deanna Hughes  09/19/2023     @PREFPERIOPPHARMACY @   Your procedure is scheduled on  09/25/2023.   Report to [facility] at  0900 A.M.   Call this number if you have problems the morning of surgery:  [phone]  If you experience any cold or flu symptoms such as cough, fever, chills, shortness of breath, etc. between now and your scheduled surgery, please notify us  at the above number.   Remember:  Do not eat after midnight.   You may drink clear liquids until 0700 am on 09/25/2023.    Clear liquids allowed are:                    Water , Juice (No red color; non-citric and without pulp; diabetics please choose diet or no sugar options), Carbonated beverages (diabetics please choose diet or no sugar options), Clear Tea (No creamer, milk, or cream, including half & half and powdered creamer), Black Coffee Only (No creamer, milk or cream, including half & half and powdered creamer), and Clear Sports drink (No red color; diabetics please choose diet or no sugar options)    Take these medicines the morning of surgery with A SIP OF WATER                                                       None.    Do not wear jewelry, make-up or nail polish, including gel polish,  artificial nails, or any other type of covering on natural nails (fingers and  toes).  Do not wear lotions, powders, or perfumes, or deodorant.  Do not shave 48 hours prior to surgery.  Men may shave face and neck.  Do not bring valuables to the hospital.  [facility] is not responsible for any belongings or valuables.  Contacts, dentures or bridgework may not be worn into surgery.  Leave your suitcase in the car.  After surgery it may be brought to your room.  For patients admitted to the hospital, discharge time will be determined by your treatment team.  Patients discharged the day of surgery will not be allowed to drive home and must have someone with them for 24 hours.    Special instructions:   DO NOT smoke  tobacco or vape for 24 hours before your procedure.  Please read over the following fact sheets that you were given. Coughing and Deep Breathing, Surgical Site Infection Prevention, Anesthesia Post-op Instructions, and Care and Recovery After Surgery        Laparoscopic Surgery for Belly Hernias: What to Know After After the procedure, it's common to have pain, discomfort, or soreness. Follow these instructions at home: Medicines Take your medicines only as told. You may need to take steps to help treat or prevent trouble pooping (constipation), such as: Taking medicines to help you poop. Eating foods high in fiber, like beans, whole grains, and fresh fruits and vegetables. Drinking more fluids as told. Ask your health care provider if it's safe to drive or use machines while taking your medicine. Incision care  Take care of the cuts in your belly as told. Make sure you: Wash your hands with soap and water  for at least 20 seconds before and after you change your bandage. If you can't use soap and water , use hand sanitizer.  Change your bandage. Leave stitches or skin glue alone. Leave tape strips alone unless you're told to take them off. You may trim the edges of the tape strips if they curl up. Check the cuts on your belly every day for signs of infection. Check for: More redness, swelling, or pain. More fluid or blood. Warmth. Pus or a bad smell. Activity Rest as told. Get up to take short walks at least every 2 hours during the day. This helps you breathe better and keeps your blood flowing. Ask for help if you feel weak or unsteady. Do not take baths, swim, or use a hot tub until you're told it's OK. Ask if you can shower. Ask if it's OK for you to lift. If you were given a sedative, do not drive or use machines until you're told it's safe. A sedative can make you sleepy. Ask what things are safe for you to do at home. Ask when you can go back to work or school. General  instructions Hold a pillow over your belly when you cough or sneeze. This helps with pain. Wear a binder around your belly as told by your provider. Do not smoke, vape, or use nicotine or tobacco. Wear compression stockings to reduce swelling and help prevent blood clots in your legs. You may be asked to continue to do deep breathing exercises at home. This will help to prevent a lung infection. Contact a health care provider if: You have any signs of infection. You have pain that gets worse or does not get better with medicine. You throw up or you feel like throwing up. You have a cough. You have not pooped in 3 days. You are not able to pee. You have a fever. Get help right away if: You have very bad pain in your belly. You throw up every time you eat or drink. You have redness, warmth, or pain in your leg. You have chest pain. You have trouble breathing. These symptoms may be an emergency. Call 911 right away. Do not wait to see if the symptoms will go away. Do not drive yourself to the hospital. This information is not intended to replace advice given to you by your health care provider. Make sure you discuss any questions you have with your health care provider. Document Revised: 07/12/2022 Document Reviewed: 07/12/2022 Elsevier Patient Education  2024 [employer].General Anesthesia, Adult, Care After The following information offers guidance on how to care for yourself after your procedure. Your health care provider may also give you more specific instructions. If you have problems or questions, contact your health care provider. What can I expect after the procedure? After the procedure, it is common for people to: Have pain or discomfort at the IV site. Have nausea or vomiting. Have a sore throat or hoarseness. Have trouble concentrating. Feel cold or chills. Feel weak, sleepy, or tired (fatigue). Have soreness and body aches. These can affect parts of the body that were  not involved in surgery. Follow these instructions at home: For the time period you were told by your health care provider:  Rest. Do not participate in activities where you could fall or become injured. Do not drive or use machinery. Do not drink alcohol. Do not take sleeping pills or medicines that cause drowsiness. Do not make important decisions or sign legal documents. Do not take care of children on your own. General instructions Drink enough fluid to keep your urine pale yellow. If you have sleep apnea, surgery  and certain medicines can increase your risk for breathing problems. Follow instructions from your health care provider about wearing your sleep device: Anytime you are sleeping, including during daytime naps. While taking prescription pain medicines, sleeping medicines, or medicines that make you drowsy. Return to your normal activities as told by your health care provider. Ask your health care provider what activities are safe for you. Take over-the-counter and prescription medicines only as told by your health care provider. Do not use any products that contain nicotine or tobacco. These products include cigarettes, chewing tobacco, and vaping devices, such as e-cigarettes. These can delay incision healing after surgery. If you need help quitting, ask your health care provider. Contact a health care provider if: You have nausea or vomiting that does not get better with medicine. You vomit every time you eat or drink. You have pain that does not get better with medicine. You cannot urinate or have bloody urine. You develop a skin rash. You have a fever. Get help right away if: You have trouble breathing. You have chest pain. You vomit blood. These symptoms may be an emergency. Get help right away. Call 911. Do not wait to see if the symptoms will go away. Do not drive yourself to the hospital. Summary After the procedure, it is common to have a sore throat,  hoarseness, nausea, vomiting, or to feel weak, sleepy, or fatigue. For the time period you were told by your health care provider, do not drive or use machinery. Get help right away if you have difficulty breathing, have chest pain, or vomit blood. These symptoms may be an emergency. This information is not intended to replace advice given to you by your health care provider. Make sure you discuss any questions you have with your health care provider. Document Revised: 04/02/2021 Document Reviewed: 04/02/2021 Elsevier Patient Education  2024 [employer] Inc.How to Use Chlorhexidine  at Home in the Shower Chlorhexidine  gluconate (CHG) is a germ-killing (antiseptic) wash that's used to clean the skin. It can get rid of the germs that normally live on the skin and can keep them away for about 24 hours. If you're having surgery, you may be told to shower with CHG at home the night before surgery. This can help lower your risk for infection. To use CHG wash in the shower, follow the steps below. Supplies needed: CHG body wash. Clean washcloth. Clean towel. How to use CHG in the shower Follow these steps unless you're told to use CHG in a different way: Start the shower. Use your normal soap and shampoo to wash your face and hair. Turn off the shower or move out of the shower stream. Pour CHG onto a clean washcloth. Do not use any type of brush or rough sponge. Start at your neck, washing your body down to your toes. Make sure you: Wash the part of your body where the surgery will be done for at least 1 minute. Do not scrub. Do not use CHG on your head or face unless your health care provider tells you to. If it gets into your ears or eyes, rinse them well with water . Do not wash your genitals with CHG. Wash your back and under your arms. Make sure to wash skin folds. Let the CHG sit on your skin for 1-2 minutes or as long as told. Rinse your entire body in the shower, including all body creases and  folds. Turn off the shower. Dry off with a clean towel. Do not put anything on  your skin afterward, such as powder, lotion, or perfume. Put on clean clothes or pajamas. If it's the night before surgery, sleep in clean sheets. General tips Use CHG only as told, and follow the instructions on the label. Use the full amount of CHG as told. This is often one bottle. Do not smoke and stay away from flames after using CHG. Your skin may feel sticky after using CHG. This is normal. The sticky feeling will go away as the CHG dries. Do not use CHG: If you have a chlorhexidine  allergy or have reacted to chlorhexidine  in the past. On open wounds or areas of skin that have broken skin, cuts, or scrapes. On babies younger than 53 months of age. Contact a health care provider if: You have questions about using CHG. Your skin gets irritated or itchy. You have a rash after using CHG. You swallow any CHG. Call your local poison control center [phone] in the [place] Your eyes itch badly, or they become very red or swollen. Your hearing changes. You have trouble seeing. If you can't reach your provider, go to an urgent care or emergency room. Do not drive yourself. Get help right away if: You have swelling or tingling in your mouth or throat. You make high-pitched whistling sounds when you breathe, most often when you breathe out (wheeze). You have trouble breathing. These symptoms may be an emergency. Call 911 right away. Do not wait to see if the symptoms will go away. Do not drive yourself to the hospital. This information is not intended to replace advice given to you by your health care provider. Make sure you discuss any questions you have with your health care provider. Document Revised: 07/19/2022 Document Reviewed: 07/15/2021 Elsevier Patient Education  2024 [employer].

## 2023-09-21 ENCOUNTER — Encounter (HOSPITAL_COMMUNITY)
Admission: RE | Admit: 2023-09-21 | Discharge: 2023-09-21 | Disposition: A | Discharge status: Home / Self Care | Source: Ambulatory Visit | Attending: General Surgery | Admitting: General Surgery

## 2023-09-21 DIAGNOSIS — N926 Irregular menstruation, unspecified: Secondary | ICD-10-CM

## 2023-09-21 DIAGNOSIS — R7301 Impaired fasting glucose: Secondary | ICD-10-CM

## 2023-09-22 DIAGNOSIS — M549 Dorsalgia, unspecified: Secondary | ICD-10-CM | POA: Diagnosis not present

## 2023-09-22 DIAGNOSIS — R5382 Chronic fatigue, unspecified: Secondary | ICD-10-CM | POA: Diagnosis not present

## 2023-09-25 DIAGNOSIS — Z01818 Encounter for other preprocedural examination: Secondary | ICD-10-CM

## 2023-10-09 ENCOUNTER — Other Ambulatory Visit: Payer: Self-pay

## 2023-10-09 ENCOUNTER — Emergency Department (HOSPITAL_COMMUNITY)
Admission: EM | Admit: 2023-10-09 | Discharge: 2023-10-09 | Disposition: A | Discharge status: Home / Self Care | Attending: Emergency Medicine | Admitting: Emergency Medicine

## 2023-10-09 ENCOUNTER — Encounter (HOSPITAL_COMMUNITY): Payer: Self-pay

## 2023-10-09 DIAGNOSIS — M542 Cervicalgia: Secondary | ICD-10-CM | POA: Diagnosis not present

## 2023-10-09 DIAGNOSIS — M62838 Other muscle spasm: Secondary | ICD-10-CM | POA: Diagnosis not present

## 2023-10-09 DIAGNOSIS — Z91040 Latex allergy status: Secondary | ICD-10-CM | POA: Diagnosis not present

## 2023-10-09 MED ORDER — ACETAMINOPHEN 500 MG PO TABS: 1000.0000 mg | ORAL_TABLET | Freq: Once | ORAL | Status: DC

## 2023-10-09 MED ORDER — METHOCARBAMOL 500 MG PO TABS: 500.0000 mg | ORAL_TABLET | Freq: Two times a day (BID) | ORAL | 0 refills | Status: AC | PRN

## 2023-10-09 MED ORDER — DEXAMETHASONE SODIUM PHOSPHATE 10 MG/ML IJ SOLN
10.0000 mg | Freq: Once | INTRAMUSCULAR | Status: AC
  Administered 2023-10-09: 10 mg via INTRAMUSCULAR
  Filled 2023-10-09: qty 1

## 2023-10-09 MED ORDER — METHOCARBAMOL 500 MG PO TABS
500.0000 mg | ORAL_TABLET | Freq: Once | ORAL | Status: AC
  Administered 2023-10-09: 500 mg via ORAL
  Filled 2023-10-09: qty 1

## 2023-10-09 NOTE — Discharge Instructions (Addendum)
 It was a pleasure taking care of you today. You were seen in the emergency department for neck pain, which is likely muscular in nature. You are being given a prescription for a Robaxin  which is a muscle relaxer to take up to 2x per day as needed for pain. Exercise caution with this medication as it can cause drowsiness.   You are also  being given instructions on exercises to try at home to ease the pain.  Follow up with your PCP if the pain continues or worsens over the next 5-7 days. If you do not have a PCP, a list of local PCPs is listed below.  [facility] Primary Care Doctor List    Rollene Pesa, MD. Specialty: [facility] Medicine Contact information: [address]  [place] [address]  [phone]   Glendia Fielding, MD. Specialty: [facility] Medicine Contact information: [address]  [place] [address]  [phone]   Benita Outhouse, MD Specialty: Internal Medicine Contact information: [address] [place] [address]  [phone]   Darlyn Hurst, MD. Specialty: Internal Medicine Contact information: [address]  [place] [address]  [phone]    [facility] (Dr. Luke) Specialty: Family Medicine Contact information: [address]  [place] [address]  [phone]   Garnette Lolling, MD. Specialty: [facility] Medicine Contact information: [address]  [address]  [place] [address]  [phone]   Gaither Langton, MD. Specialty: Internal Medicine Contact information: [address]  [address]  [place] [address]  [phone]   [facility]: [address] [phone]  [place], Family medicine [address]  [phone]  [facility] [address] [place], [place] [phone]  [facility]: [address] [phone]    [facility]  [address] [place], [place] [address] [phone]  Services [facility] offers a variety of basic health services.  Services include but are not limited to: Blood pressure checks  Heart rate checks  Blood sugar checks  Urine analysis  Rapid strep tests  Pregnancy tests.  Health education and referrals  People needing more complex services will be directed to a physician online. Using these virtual visits, doctors can evaluate and prescribe medicine and treatments. There will be no medication on-site, though [facility] will help patients fill their prescriptions at little to no cost.   For More information please go to: [email or web address]  Allergy and Asthma:    [address] [place] [phone]  Urology:  [address]  [place] [phone]  [facility]  [address] [place], [place] [phone]  Orthopedics   [address] [place], [place] [phone]  Endocrinology  [address] [place], [place] [phone]  Podiatry: [facility] [phone]

## 2023-10-09 NOTE — ED Provider Notes (Signed)
 [place] EMERGENCY DEPARTMENT AT [facility] Provider Note   CSN: [id number] Arrival date & time: 10/09/23  2043     Patient presents with: Back Pain   Deanna Hughes is a 33 y.o. female with PMHX of scoliosis who presents to the emergency department for complaints of neck and shoulder pain that began this morning. She describes the pain as sharp with intermittent burning to her shoulder blades. She reports she was typing on her phone looking upward when this occurred. She denies history of heavy lifting, recent car accident or other trauma, fever or systemic symptoms.    Back Pain      Prior to Admission medications   Medication Sig Start Date End Date Taking? Authorizing Provider  methocarbamol  (ROBAXIN ) 500 MG tablet Take 1 tablet (500 mg total) by mouth 2 (two) times daily as needed for muscle spasms. 10/09/23  Yes Tyreck Bell, Marry RAMAN, PA-C  chlorhexidine  (PERIDEX ) 0.12 % solution Use as directed 15 mLs in the mouth or throat 2 (two) times daily. 04/21/23   Stuart Vernell Norris, PA-C  colestipol  (COLESTID ) 1 g tablet Take 2 tablets (2 g total) by mouth daily. Do not take within 3 hours of other medications. 06/05/23   Ezzard Sonny RAMAN, PA-C  etonogestrel  (NEXPLANON ) 68 MG IMPL implant 1 each by Subdermal route once.    [provider]  Vitamin D, Ergocalciferol, (DRISDOL) 1.25 MG (50000 UNIT) CAPS capsule Take 50,000 Units by mouth once a week. 01/01/21   [provider]    Allergies: Aspirin, Advil  [ibuprofen ], and Latex    Review of Systems  Musculoskeletal:  Positive for back pain and neck pain.   Physical Exam Constitutional:      Appearance: Normal appearance.  HENT:     Head: Normocephalic and atraumatic.  Eyes:     General: No scleral icterus.    Conjunctiva/sclera: Conjunctivae normal.  Neck:     Meningeal: Brudzinski's sign and Kernig's sign absent.     Comments: Negative meningeal signs. ROM restricted due to pain. C-spine  tenderness to palpation. No crepitus or bony step-offs. No radiculopathy. Shoulder pain to palpation, worse on right. Cardiovascular:     Pulses: Normal pulses.  Pulmonary:     Effort: Pulmonary effort is normal.  Musculoskeletal:     Cervical back: Pain with movement and muscular tenderness present.  Skin:    General: Skin is warm and dry.     Capillary Refill: Capillary refill takes less than 2 seconds.     Coloration: Skin is not jaundiced.  Neurological:     General: No focal deficit present.     Mental Status: She is alert.  Psychiatric:        Mood and Affect: Mood normal.      Updated Vital Signs BP 120/81 (BP Location: Right Arm)   Pulse 78   Temp 98.6 F (37 C) (Oral)   Resp 18   Ht 5' 2 (1.575 m)   Wt 84.8 kg   SpO2 100%   BMI 34.20 kg/m     (all labs ordered are listed, but only abnormal results are displayed) Labs Reviewed - No data to display  EKG: None  Radiology: No results found.    Medications Ordered in the ED  acetaminophen  (TYLENOL ) tablet 1,000 mg (0 mg Oral Hold 10/09/23 2121)  dexamethasone  (DECADRON ) injection 10 mg (has no administration in time range)  methocarbamol  (ROBAXIN ) tablet 500 mg (500 mg Oral Given 10/09/23 2124)  Medical Decision Making Risk OTC drugs. Prescription drug management.   This patient presents to the ED for concern of neck pain, this involves an extensive number of treatment options, and is a complaint that carries with it a high risk of complications and morbidity.  Differential diagnosis includes: radiculopathy, meningitis, muscle spasm, c-spine fractures.  Co morbidities: none  Social Determinants of Health:  none  Additional history:  History of scoliosis managed by Dr. Norleen PEDLAR. [place] in [place].  Lab Tests:  Not indicated  Imaging Studies:  Not indicated   Medicines ordered and prescription drug management:  I ordered medication including   Medications  acetaminophen  (TYLENOL ) tablet 1,000 mg (0 mg Oral Hold 10/09/23 2121)  dexamethasone  (DECADRON ) injection 10 mg (has no administration in time range)  methocarbamol  (ROBAXIN ) tablet 500 mg (500 mg Oral Given 10/09/23 2124)   for pain control. Reevaluation of the patient after these medicines showed that the patient improved I have reviewed the patients home medicines and have made adjustments as needed  Test Considered:  N/A  Critical Interventions:  None  Consultations Obtained: None  Problem List / ED Course:     ICD-10-CM   1. Muscle spasms of neck  M62.838       MDM: Patient reports to the emergency department for atraumatic neck and shoulder pain that started this morning. Negative meningeal signs. Afebrile. Imaging not indicated. Labs not indicated. This is likely muscular in nature and not secondary to her known scoliosis. Patient given robaxin  for muscle spasms.    Dispostion:  After consideration of the diagnostic results and the patients response to treatment, I feel that the patient would benefit from continued medication management outpatient.    Final diagnoses:  Muscle spasms of neck    ED Discharge Orders          Ordered    methocarbamol  (ROBAXIN ) 500 MG tablet  2 times daily PRN        10/09/23 2201               Torrence Marry RAMAN, PA-C 10/09/23 2255    Cleotilde Rogue, MD 10/10/23 [phone]

## 2023-10-09 NOTE — ED Triage Notes (Signed)
 Pt reports that while she was at work today she began to have upper back and neck pain that she thought was related to her scoliosis, but the pain is getting worse and even hurts to lay down.  Pt reports she tried tylenol  but it did not help at all.

## 2023-10-12 DIAGNOSIS — M50322 Other cervical disc degeneration at C5-C6 level: Secondary | ICD-10-CM | POA: Diagnosis not present

## 2023-10-12 DIAGNOSIS — M542 Cervicalgia: Secondary | ICD-10-CM | POA: Diagnosis not present

## 2023-10-12 DIAGNOSIS — M436 Torticollis: Secondary | ICD-10-CM | POA: Diagnosis not present

## 2023-10-12 DIAGNOSIS — Z91040 Latex allergy status: Secondary | ICD-10-CM | POA: Diagnosis not present

## 2023-10-27 ENCOUNTER — Telehealth: Payer: Self-pay | Admitting: *Deleted

## 2023-10-27 NOTE — Telephone Encounter (Signed)
 Surgical Date: 11/08/2023 Procedure: XI Robotic Assisted Ventral Hernia Repair W/ Mesh, 7 CM  Received call from patient [phone]~ telephone  Patient stated that she did not want robotic surgery.   Discussed with Dr. Mavis who is agreeable to change in procedure.   Procedure changed.

## 2023-11-03 NOTE — Patient Instructions (Signed)
 Your procedure is scheduled on: 1-/22/2025  Report to [facility] Main Entrance at    6:30 AM.  Call this number if you have problems the morning of surgery: [phone]   Remember:   Do not Eat or Drink after midnight         No Smoking the morning of surgery  :  Take these medicines the morning of surgery with A SIP OF WATER : robaxin  if needed   Do not wear jewelry, make-up or nail polish.  Do not wear lotions, powders, or perfumes. You may wear deodorant.  Do not shave 48 hours prior to surgery. Men may shave face and neck.  Do not bring valuables to the hospital.  Contacts, dentures or bridgework may not be worn into surgery.  Leave suitcase in the car. After surgery it may be brought to your room.  For patients admitted to the hospital, checkout time is 11:00 AM the day of discharge.   Patients discharged the day of surgery will not be allowed to drive home.    Special Instructions: Shower using CHG night before surgery and shower the day of surgery use CHG.  Use special wash - you have one bottle of CHG for all showers.  You should use approximately 1/2 of the bottle for each shower. How to Use Chlorhexidine  at Home in the Shower Chlorhexidine  gluconate (CHG) is a germ-killing (antiseptic) wash that's used to clean the skin. It can get rid of the germs that normally live on the skin and can keep them away for about 24 hours. If you're having surgery, you may be told to shower with CHG at home the night before surgery. This can help lower your risk for infection. To use CHG wash in the shower, follow the steps below. Supplies needed: CHG body wash. Clean washcloth. Clean towel. How to use CHG in the shower Follow these steps unless you're told to use CHG in a different way: Start the shower. Use your normal soap and shampoo to wash your face and hair. Turn off the shower or move out of the shower stream. Pour CHG onto a clean washcloth. Do not use any type of brush or  rough sponge. Start at your neck, washing your body down to your toes. Make sure you: Wash the part of your body where the surgery will be done for at least 1 minute. Do not scrub. Do not use CHG on your head or face unless your health care provider tells you to. If it gets into your ears or eyes, rinse them well with water . Do not wash your genitals with CHG. Wash your back and under your arms. Make sure to wash skin folds. Let the CHG sit on your skin for 1-2 minutes or as long as told. Rinse your entire body in the shower, including all body creases and folds. Turn off the shower. Dry off with a clean towel. Do not put anything on your skin afterward, such as powder, lotion, or perfume. Put on clean clothes or pajamas. If it's the night before surgery, sleep in clean sheets. General tips Use CHG only as told, and follow the instructions on the label. Use the full amount of CHG as told. This is often one bottle. Do not smoke and stay away from flames after using CHG. Your skin may feel sticky after using CHG. This is normal. The sticky feeling will go away as the CHG dries. Do not use CHG: If you have a chlorhexidine  allergy or  have reacted to chlorhexidine  in the past. On open wounds or areas of skin that have broken skin, cuts, or scrapes. On babies younger than 67 months of age. Contact a health care provider if: You have questions about using CHG. Your skin gets irritated or itchy. You have a rash after using CHG. You swallow any CHG. Call your local poison control center [phone] in the [place] Your eyes itch badly, or they become very red or swollen. Your hearing changes. You have trouble seeing. If you can't reach your provider, go to an urgent care or emergency room. Do not drive yourself. Get help right away if: You have swelling or tingling in your mouth or throat. You make high-pitched whistling sounds when you breathe, most often when you breathe out (wheeze). You  have trouble breathing. These symptoms may be an emergency. Call 911 right away. Do not wait to see if the symptoms will go away. Do not drive yourself to the hospital. This information is not intended to replace advice given to you by your health care provider. Make sure you discuss any questions you have with your health care provider. Document Revised: 07/19/2022 Document Reviewed: 07/15/2021 Elsevier Patient Education  2024 [employer] Inc. Laparoscopic Surgery for Belly Hernias: What to Know After After the procedure, it's common to have pain, discomfort, or soreness. Follow these instructions at home: Medicines Take your medicines only as told. You may need to take steps to help treat or prevent trouble pooping (constipation), such as: Taking medicines to help you poop. Eating foods high in fiber, like beans, whole grains, and fresh fruits and vegetables. Drinking more fluids as told. Ask your health care provider if it's safe to drive or use machines while taking your medicine. Incision care  Take care of the cuts in your belly as told. Make sure you: Wash your hands with soap and water  for at least 20 seconds before and after you change your bandage. If you can't use soap and water , use hand sanitizer. Change your bandage. Leave stitches or skin glue alone. Leave tape strips alone unless you're told to take them off. You may trim the edges of the tape strips if they curl up. Check the cuts on your belly every day for signs of infection. Check for: More redness, swelling, or pain. More fluid or blood. Warmth. Pus or a bad smell. Activity Rest as told. Get up to take short walks at least every 2 hours during the day. This helps you breathe better and keeps your blood flowing. Ask for help if you feel weak or unsteady. Do not take baths, swim, or use a hot tub until you're told it's OK. Ask if you can shower. Ask if it's OK for you to lift. If you were given a sedative, do not  drive or use machines until you're told it's safe. A sedative can make you sleepy. Ask what things are safe for you to do at home. Ask when you can go back to work or school. General instructions Hold a pillow over your belly when you cough or sneeze. This helps with pain. Wear a binder around your belly as told by your provider. Do not smoke, vape, or use nicotine or tobacco. Wear compression stockings to reduce swelling and help prevent blood clots in your legs. You may be asked to continue to do deep breathing exercises at home. This will help to prevent a lung infection. Contact a health care provider if: You have any signs of infection.  You have pain that gets worse or does not get better with medicine. You throw up or you feel like throwing up. You have a cough. You have not pooped in 3 days. You are not able to pee. You have a fever. Get help right away if: You have very bad pain in your belly. You throw up every time you eat or drink. You have redness, warmth, or pain in your leg. You have chest pain. You have trouble breathing. These symptoms may be an emergency. Call 911 right away. Do not wait to see if the symptoms will go away. Do not drive yourself to the hospital. This information is not intended to replace advice given to you by your health care provider. Make sure you discuss any questions you have with your health care provider. Document Revised: 07/12/2022 Document Reviewed: 07/12/2022 Elsevier Patient Education  2024 Elsevier Inc. General Anesthesia, Adult, Care After The following information offers guidance on how to care for yourself after your procedure. Your health care provider may also give you more specific instructions. If you have problems or questions, contact your health care provider. What can I expect after the procedure? After the procedure, it is common for people to: Have pain or discomfort at the IV site. Have nausea or vomiting. Have a sore  throat or hoarseness. Have trouble concentrating. Feel cold or chills. Feel weak, sleepy, or tired (fatigue). Have soreness and body aches. These can affect parts of the body that were not involved in surgery. Follow these instructions at home: For the time period you were told by your health care provider:  Rest. Do not participate in activities where you could fall or become injured. Do not drive or use machinery. Do not drink alcohol. Do not take sleeping pills or medicines that cause drowsiness. Do not make important decisions or sign legal documents. Do not take care of children on your own. General instructions Drink enough fluid to keep your urine pale yellow. If you have sleep apnea, surgery and certain medicines can increase your risk for breathing problems. Follow instructions from your health care provider about wearing your sleep device: Anytime you are sleeping, including during daytime naps. While taking prescription pain medicines, sleeping medicines, or medicines that make you drowsy. Return to your normal activities as told by your health care provider. Ask your health care provider what activities are safe for you. Take over-the-counter and prescription medicines only as told by your health care provider. Do not use any products that contain nicotine or tobacco. These products include cigarettes, chewing tobacco, and vaping devices, such as e-cigarettes. These can delay incision healing after surgery. If you need help quitting, ask your health care provider. Contact a health care provider if: You have nausea or vomiting that does not get better with medicine. You vomit every time you eat or drink. You have pain that does not get better with medicine. You cannot urinate or have bloody urine. You develop a skin rash. You have a fever. Get help right away if: You have trouble breathing. You have chest pain. You vomit blood. These symptoms may be an emergency. Get help  right away. Call 911. Do not wait to see if the symptoms will go away. Do not drive yourself to the hospital. Summary After the procedure, it is common to have a sore throat, hoarseness, nausea, vomiting, or to feel weak, sleepy, or fatigue. For the time period you were told by your health care provider, do not drive or  use machinery. Get help right away if you have difficulty breathing, have chest pain, or vomit blood. These symptoms may be an emergency. This information is not intended to replace advice given to you by your health care provider. Make sure you discuss any questions you have with your health care provider. Document Revised: 04/02/2021 Document Reviewed: 04/02/2021 Elsevier Patient Education  2024 [employer].

## 2023-11-06 ENCOUNTER — Encounter (HOSPITAL_COMMUNITY): Payer: Self-pay

## 2023-11-06 ENCOUNTER — Encounter (HOSPITAL_COMMUNITY)
Admission: RE | Admit: 2023-11-06 | Discharge: 2023-11-06 | Disposition: A | Discharge status: Home / Self Care | Source: Ambulatory Visit | Attending: General Surgery | Admitting: General Surgery

## 2023-11-06 DIAGNOSIS — R7301 Impaired fasting glucose: Secondary | ICD-10-CM | POA: Insufficient documentation

## 2023-11-06 DIAGNOSIS — N926 Irregular menstruation, unspecified: Secondary | ICD-10-CM | POA: Insufficient documentation

## 2023-11-06 DIAGNOSIS — Z01812 Encounter for preprocedural laboratory examination: Secondary | ICD-10-CM | POA: Insufficient documentation

## 2023-11-06 DIAGNOSIS — R519 Headache, unspecified: Secondary | ICD-10-CM | POA: Diagnosis not present

## 2023-11-06 DIAGNOSIS — F419 Anxiety disorder, unspecified: Secondary | ICD-10-CM | POA: Diagnosis not present

## 2023-11-06 DIAGNOSIS — K439 Ventral hernia without obstruction or gangrene: Secondary | ICD-10-CM | POA: Diagnosis not present

## 2023-11-06 DIAGNOSIS — F32A Depression, unspecified: Secondary | ICD-10-CM | POA: Diagnosis not present

## 2023-11-06 DIAGNOSIS — I10 Essential (primary) hypertension: Secondary | ICD-10-CM | POA: Diagnosis not present

## 2023-11-06 DIAGNOSIS — Z01818 Encounter for other preprocedural examination: Secondary | ICD-10-CM

## 2023-11-06 DIAGNOSIS — Z833 Family history of diabetes mellitus: Secondary | ICD-10-CM | POA: Diagnosis not present

## 2023-11-06 DIAGNOSIS — Z79899 Other long term (current) drug therapy: Secondary | ICD-10-CM | POA: Diagnosis not present

## 2023-11-06 LAB — BASIC METABOLIC PANEL WITH GFR
Anion gap: 10 (ref 5–15)
BUN: 8 mg/dL (ref 6–20)
CO2: 24 mmol/L (ref 22–32)
Calcium: 9.1 mg/dL (ref 8.9–10.3)
Chloride: 103 mmol/L (ref 98–111)
Creatinine, Ser: 0.68 mg/dL (ref 0.44–1.00)
GFR, Estimated: >60 mL/min (ref >60)
Glucose, Bld: 93 mg/dL (ref 70–99)
Potassium: 4.3 mmol/L (ref 3.5–5.1)
Sodium: 137 mmol/L (ref 135–145)

## 2023-11-06 LAB — CBC WITH DIFFERENTIAL/PLATELET
Abs Immature Granulocytes: 0.02 K/uL (ref 0.00–0.07)
Basophils Absolute: 0 K/uL (ref 0.0–0.1)
Basophils Relative: 0 %
Eosinophils Absolute: 0.1 K/uL (ref 0.0–0.5)
Eosinophils Relative: 1 %
HCT: 39.2 % (ref 36.0–46.0)
Hemoglobin: 13.2 g/dL (ref 12.0–15.0)
Immature Granulocytes: 0 %
Lymphocytes Relative: 32 %
Lymphs Abs: 1.7 K/uL (ref 0.7–4.0)
MCH: 29.8 pg (ref 26.0–34.0)
MCHC: 33.7 g/dL (ref 30.0–36.0)
MCV: 88.5 fL (ref 80.0–100.0)
Monocytes Absolute: 0.4 K/uL (ref 0.1–1.0)
Monocytes Relative: 6 %
Neutro Abs: 3.3 K/uL (ref 1.7–7.7)
Neutrophils Relative %: 61 %
Platelets: 235 K/uL (ref 150–400)
RBC: 4.43 MIL/uL (ref 3.87–5.11)
RDW: 12.6 % (ref 11.5–15.5)
WBC: 5.5 K/uL (ref 4.0–10.5)
nRBC: 0 % (ref 0.0–0.2)

## 2023-11-06 LAB — HCG, SERUM, QUALITATIVE: Preg, Serum: NEGATIVE

## 2023-11-08 ENCOUNTER — Other Ambulatory Visit: Payer: Self-pay

## 2023-11-08 ENCOUNTER — Ambulatory Visit (HOSPITAL_COMMUNITY): Payer: Self-pay | Admitting: Certified Registered Nurse Anesthetist

## 2023-11-08 ENCOUNTER — Ambulatory Visit (HOSPITAL_COMMUNITY)
Admission: RE | Admit: 2023-11-08 | Discharge: 2023-11-08 | Disposition: A | Discharge status: Home / Self Care | Attending: General Surgery | Admitting: General Surgery

## 2023-11-08 ENCOUNTER — Encounter (HOSPITAL_COMMUNITY)
Admission: RE | Disposition: A | Discharge status: Home / Self Care | Payer: Self-pay | Source: Home / Self Care | Attending: General Surgery

## 2023-11-08 ENCOUNTER — Encounter (HOSPITAL_COMMUNITY): Payer: Self-pay | Admitting: General Surgery

## 2023-11-08 DIAGNOSIS — F419 Anxiety disorder, unspecified: Secondary | ICD-10-CM | POA: Insufficient documentation

## 2023-11-08 DIAGNOSIS — Z01818 Encounter for other preprocedural examination: Secondary | ICD-10-CM

## 2023-11-08 DIAGNOSIS — Z79899 Other long term (current) drug therapy: Secondary | ICD-10-CM | POA: Insufficient documentation

## 2023-11-08 DIAGNOSIS — F32A Depression, unspecified: Secondary | ICD-10-CM | POA: Insufficient documentation

## 2023-11-08 DIAGNOSIS — I10 Essential (primary) hypertension: Secondary | ICD-10-CM | POA: Insufficient documentation

## 2023-11-08 DIAGNOSIS — Z833 Family history of diabetes mellitus: Secondary | ICD-10-CM | POA: Insufficient documentation

## 2023-11-08 DIAGNOSIS — K439 Ventral hernia without obstruction or gangrene: Secondary | ICD-10-CM | POA: Insufficient documentation

## 2023-11-08 DIAGNOSIS — R519 Headache, unspecified: Secondary | ICD-10-CM | POA: Insufficient documentation

## 2023-11-08 HISTORY — PX: HERNIA REPAIR: SHX51

## 2023-11-08 HISTORY — PX: VENTRAL HERNIA REPAIR: SHX424

## 2023-11-08 SURGERY — REPAIR, HERNIA, VENTRAL
Anesthesia: General

## 2023-11-08 MED ORDER — ALBUMIN HUMAN 5 % IV SOLN
INTRAVENOUS | Status: AC
  Filled 2023-11-08: qty 250

## 2023-11-08 MED ORDER — ROCURONIUM BROMIDE 10 MG/ML (PF) SYRINGE
PREFILLED_SYRINGE | INTRAVENOUS | Status: AC
Start: 2023-11-08 — End: 2023-11-08
  Filled 2023-11-08: qty 10

## 2023-11-08 MED ORDER — ARTIFICIAL TEARS OPHTHALMIC OINT
TOPICAL_OINTMENT | OPHTHALMIC | Status: AC
  Filled 2023-11-08: qty 3.5

## 2023-11-08 MED ORDER — MIDAZOLAM HCL (PF) 2 MG/2ML IJ SOLN
INTRAMUSCULAR | Status: DC | PRN
Start: 2023-11-08 — End: 2023-11-08
  Administered 2023-11-08: 2 mg via INTRAVENOUS

## 2023-11-08 MED ORDER — BUPIVACAINE-EPINEPHRINE (PF) 0.5% -1:200000 IJ SOLN
INTRAMUSCULAR | Status: AC
  Filled 2023-11-08: qty 30

## 2023-11-08 MED ORDER — 0.9 % SODIUM CHLORIDE (POUR BTL) OPTIME
TOPICAL | Status: DC | PRN
  Administered 2023-11-08: 1000 mL

## 2023-11-08 MED ORDER — LIDOCAINE 2% (20 MG/ML) 5 ML SYRINGE
INTRAMUSCULAR | Status: AC
  Filled 2023-11-08: qty 5

## 2023-11-08 MED ORDER — LIDOCAINE 2% (20 MG/ML) 5 ML SYRINGE
INTRAMUSCULAR | Status: DC | PRN
  Administered 2023-11-08: 50 mg via INTRAVENOUS

## 2023-11-08 MED ORDER — ONDANSETRON HCL 4 MG/2ML IJ SOLN
INTRAMUSCULAR | Status: DC | PRN
  Administered 2023-11-08: 4 mg via INTRAVENOUS

## 2023-11-08 MED ORDER — PROPOFOL 10 MG/ML IV BOLUS
INTRAVENOUS | Status: AC
  Filled 2023-11-08: qty 20

## 2023-11-08 MED ORDER — OXYCODONE HCL 5 MG/5ML PO SOLN: 5.0000 mg | Freq: Once | ORAL | Status: AC | PRN

## 2023-11-08 MED ORDER — FENTANYL CITRATE (PF) 250 MCG/5ML IJ SOLN
INTRAMUSCULAR | Status: AC
  Filled 2023-11-08: qty 5

## 2023-11-08 MED ORDER — BUPIVACAINE HCL (PF) 0.5 % IJ SOLN
INTRAMUSCULAR | Status: DC | PRN
  Administered 2023-11-08: 24 mL

## 2023-11-08 MED ORDER — CHLORHEXIDINE GLUCONATE CLOTH 2 % EX PADS: 6.0000 | MEDICATED_PAD | Freq: Once | CUTANEOUS | Status: DC

## 2023-11-08 MED ORDER — ONDANSETRON HCL 4 MG/2ML IJ SOLN: 4.0000 mg | Freq: Once | INTRAMUSCULAR | Status: DC | PRN

## 2023-11-08 MED ORDER — FENTANYL CITRATE (PF) 250 MCG/5ML IJ SOLN
INTRAMUSCULAR | Status: DC | PRN
  Administered 2023-11-08: 100 ug via INTRAVENOUS
  Administered 2023-11-08 (×2): 25 ug via INTRAVENOUS

## 2023-11-08 MED ORDER — OXYCODONE HCL 5 MG PO TABS
5.0000 mg | ORAL_TABLET | Freq: Once | ORAL | Status: AC | PRN
  Administered 2023-11-08: 5 mg via ORAL
  Filled 2023-11-08: qty 1

## 2023-11-08 MED ORDER — ROCURONIUM BROMIDE 10 MG/ML (PF) SYRINGE
PREFILLED_SYRINGE | INTRAVENOUS | Status: DC | PRN
  Administered 2023-11-08: 50 mg via INTRAVENOUS

## 2023-11-08 MED ORDER — OXYCODONE HCL 5 MG PO TABS: 5.0000 mg | ORAL_TABLET | ORAL | 0 refills | Status: DC | PRN

## 2023-11-08 MED ORDER — DEXAMETHASONE SOD PHOSPHATE PF 10 MG/ML IJ SOLN
INTRAMUSCULAR | Status: DC | PRN
Start: 2023-11-08 — End: 2023-11-08
  Administered 2023-11-08: 10 mg via INTRAVENOUS

## 2023-11-08 MED ORDER — CEFAZOLIN SODIUM-DEXTROSE 2-4 GM/100ML-% IV SOLN
2.0000 g | INTRAVENOUS | Status: AC
  Administered 2023-11-08: 2 g via INTRAVENOUS
  Filled 2023-11-08: qty 100

## 2023-11-08 MED ORDER — PROPOFOL 10 MG/ML IV BOLUS
INTRAVENOUS | Status: DC | PRN
Start: 2023-11-08 — End: 2023-11-08
  Administered 2023-11-08: 150 mg via INTRAVENOUS
  Administered 2023-11-08: 50 mg via INTRAVENOUS

## 2023-11-08 MED ORDER — FENTANYL CITRATE (PF) 50 MCG/ML IJ SOSY
25.0000 ug | PREFILLED_SYRINGE | INTRAMUSCULAR | Status: DC | PRN
  Administered 2023-11-08 (×3): 50 ug via INTRAVENOUS
  Filled 2023-11-08 (×3): qty 1

## 2023-11-08 MED ORDER — SUGAMMADEX SODIUM 200 MG/2ML IV SOLN
INTRAVENOUS | Status: DC | PRN
  Administered 2023-11-08: 169.6 mg via INTRAVENOUS

## 2023-11-08 MED ORDER — ONDANSETRON HCL 4 MG/2ML IJ SOLN
INTRAMUSCULAR | Status: AC
  Filled 2023-11-08: qty 2

## 2023-11-08 MED ORDER — BUPIVACAINE HCL (PF) 0.5 % IJ SOLN
INTRAMUSCULAR | Status: AC
  Filled 2023-11-08: qty 30

## 2023-11-08 MED ORDER — CHLORHEXIDINE GLUCONATE 0.12 % MT SOLN
OROMUCOSAL | Status: DC
Start: 2023-11-08 — End: 2023-11-08
  Filled 2023-11-08: qty 15

## 2023-11-08 MED ORDER — LACTATED RINGERS IV SOLN: INTRAVENOUS | Status: DC | PRN

## 2023-11-08 MED ORDER — PHENYLEPHRINE 80 MCG/ML (10ML) SYRINGE FOR IV PUSH (FOR BLOOD PRESSURE SUPPORT)
PREFILLED_SYRINGE | INTRAVENOUS | Status: AC
  Filled 2023-11-08: qty 10

## 2023-11-08 MED ORDER — MIDAZOLAM HCL 2 MG/2ML IJ SOLN
INTRAMUSCULAR | Status: AC
  Filled 2023-11-08: qty 2

## 2023-11-08 SURGICAL SUPPLY — 27 items
CHLORAPREP W/TINT 26 (MISCELLANEOUS) ×2 IMPLANT
CLOTH BEACON ORANGE TIMEOUT ST (SAFETY) ×2 IMPLANT
COVER LIGHT HANDLE STERIS (MISCELLANEOUS) ×4 IMPLANT
DERMABOND ADVANCED .7 DNX12 (GAUZE/BANDAGES/DRESSINGS) IMPLANT
ELECTRODE REM PT RTRN 9FT ADLT (ELECTROSURGICAL) ×2 IMPLANT
GAUZE SPONGE 4X4 12PLY STRL (GAUZE/BANDAGES/DRESSINGS) ×2 IMPLANT
GLOVE BIOGEL PI IND STRL 7.0 (GLOVE) ×4 IMPLANT
GLOVE SURG SS PI 7.5 STRL IVOR (GLOVE) ×4 IMPLANT
GOWN STRL REUS W/TWL LRG LVL3 (GOWN DISPOSABLE) ×6 IMPLANT
INST SET MAJOR GENERAL (KITS) ×2 IMPLANT
KIT TURNOVER KIT A (KITS) ×2 IMPLANT
MANIFOLD NEPTUNE II (INSTRUMENTS) ×2 IMPLANT
MESH VENTRALEX ST 2.5 CRC MED (Mesh General) IMPLANT
NDL HYPO 18GX1.5 BLUNT FILL (NEEDLE) ×2 IMPLANT
NDL HYPO 21X1.5 SAFETY (NEEDLE) ×2 IMPLANT
NEEDLE HYPO 18GX1.5 BLUNT FILL (NEEDLE) ×1 IMPLANT
NEEDLE HYPO 21X1.5 SAFETY (NEEDLE) ×1 IMPLANT
NS IRRIG 1000ML POUR BTL (IV SOLUTION) ×2 IMPLANT
PACK ABDOMINAL MAJOR (CUSTOM PROCEDURE TRAY) ×2 IMPLANT
PAD ARMBOARD POSITIONER FOAM (MISCELLANEOUS) ×2 IMPLANT
POSITIONER HEAD 8X9X4 ADT (SOFTGOODS) ×2 IMPLANT
SET BASIN LINEN APH (SET/KITS/TRAYS/PACK) ×2 IMPLANT
SUT ETHIBOND NAB MO 7 #0 18IN (SUTURE) ×2 IMPLANT
SUT MNCRL AB 4-0 PS2 18 (SUTURE) IMPLANT
SUT VIC AB 2-0 CT2 27 (SUTURE) IMPLANT
SUT VIC AB 3-0 SH 27X BRD (SUTURE) ×2 IMPLANT
SYR 30ML LL (SYRINGE) ×4 IMPLANT

## 2023-11-08 NOTE — Discharge Instructions (Addendum)

## 2023-11-08 NOTE — Anesthesia Preprocedure Evaluation (Signed)
 Anesthesia Evaluation  Patient identified by MRN, date of birth, ID band Patient awake    Reviewed: Allergy & Precautions, H&P , NPO status , Patient's Chart, lab work & pertinent test results, reviewed documented beta blocker date and time   Airway Mallampati: II  TM Distance: >3 FB Neck ROM: full    Dental no notable dental hx.    Pulmonary neg pulmonary ROS   Pulmonary exam normal breath sounds clear to auscultation       Cardiovascular Exercise Tolerance: Good hypertension, negative cardio ROS  Rhythm:regular Rate:Normal     Neuro/Psych  Headaches PSYCHIATRIC DISORDERS Anxiety Depression       GI/Hepatic negative GI ROS, Neg liver ROS,,,  Endo/Other  negative endocrine ROS    Renal/GU negative Renal ROS  negative genitourinary   Musculoskeletal   Abdominal   Peds  Hematology negative hematology ROS (+)   Anesthesia Other Findings   Reproductive/Obstetrics negative OB ROS                              Anesthesia Physical Anesthesia Plan  ASA: 2  Anesthesia Plan: General and General ETT   Post-op Pain Management:    Induction:   PONV Risk Score and Plan: Ondansetron   Airway Management Planned:   Additional Equipment:   Intra-op Plan:   Post-operative Plan:   Informed Consent: I have reviewed the patients History and Physical, chart, labs and discussed the procedure including the risks, benefits and alternatives for the proposed anesthesia with the patient or authorized representative who has indicated his/her understanding and acceptance.     Dental Advisory Given  Plan Discussed with: CRNA  Anesthesia Plan Comments:         Anesthesia Quick Evaluation

## 2023-11-08 NOTE — Op Note (Signed)
 Patient:  Deanna Hughes  DOB:  01-28-1990  MRN:  [id number]   Preop Diagnosis: Ventral hernia  Postop Diagnosis: Same  Procedure: Ventral herniorrhaphy with mesh  Surgeon: Oneil Budge, MD  Anes: General Endotracheal  Indications: Patient is a 33 year old black female who presents with a periumbilical hernia.  She has status post laparoscopic cholecystectomy in the remote past.  The risks and benefits of the procedure including bleeding, infection, mesh use, and the possibility of recurrence of the hernia were fully explained to the patient, who gave informed consent.  Procedure note: The patient was placed in the supine position.  After induction of general endotracheal anesthesia, the abdomen was prepped and draped using the usual sterile technique with ChloraPrep.  Surgical site confirmation was performed.  An infraumbilical incision was made down to the fascia.  The umbilicus was freed away from the underlying fascia.  Omental contents were noted within the hernia sac and these were reduced without difficulty.  The resultant defect was approximately 2 cm in its greatest diameter.  I swept the underside of the abdominal wall with my finger to confirm that there were no other defects present.  A 6.4 cm Bard Ventralex ST patch was then inserted and secured to the fascia using 0 Ethibond interrupted sutures.  The overlying fascia was reapproximated using 0 Ethibond interrupted sutures.  The base of the umbilicus was secured back to the fascia using a 2-0 Vicryl interrupted suture.  The subcutaneous layer was reapproximated using 3-0 Vicryl interrupted sutures.  0.5% Sensorcaine  was instilled into the surrounding wound.  The skin was closed using a 4-0 Monocryl subcuticular suture.  Dermabond was applied.  All tape and needle counts were correct at the end of the procedure.  The patient was extubated in the operating room and transferred to PACU in stable condition.  Complications:  None  EBL: Minimal  Specimen: None

## 2023-11-08 NOTE — Progress Notes (Signed)
 RN helped with dressing patient and escorted her to the car.

## 2023-11-08 NOTE — Anesthesia Procedure Notes (Signed)
 Procedure Name: Intubation Date/Time: 11/08/2023 8:36 AM  Performed by: Harrold Macintosh, CRNAPre-anesthesia Checklist: Patient identified, Emergency Drugs available, Suction available and Patient being monitored Patient Re-evaluated:Patient Re-evaluated prior to induction Oxygen Delivery Method: Circle system utilized Preoxygenation: Pre-oxygenation with 100% oxygen Induction Type: IV induction Ventilation: Mask ventilation without difficulty Laryngoscope Size: Miller and 2 Grade View: Grade II Tube type: Oral Tube size: 7.0 mm Number of attempts: 1 Airway Equipment and Method: Stylet and Bite block Placement Confirmation: ETT inserted through vocal cords under direct vision, positive ETCO2 and breath sounds checked- equal and bilateral Secured at: 21 cm Tube secured with: Tape Dental Injury: Teeth and Oropharynx as per pre-operative assessment

## 2023-11-08 NOTE — Transfer of Care (Signed)
 Immediate Anesthesia Transfer of Care Note  Patient: Deanna Hughes  Procedure(s) Performed: REPAIR, HERNIA, VENTRAL WITH MESH  Patient Location: PACU  Anesthesia Type:General  Level of Consciousness: awake, alert , and oriented  Airway & Oxygen Therapy: Patient Spontanous Breathing and Patient connected to face mask oxygen  Post-op Assessment: Report given to RN, Post -op Vital signs reviewed and stable, Patient moving all extremities X 4, and Patient able to stick tongue midline  Post vital signs: Reviewed and stable  Last Vitals:  Vitals Value Taken Time  BP 114/62 11/08/23 09:33  Temp 37.1 C 11/08/23 09:32  Pulse 90 11/08/23 09:34  Resp 21 11/08/23 09:34  SpO2 97 % 11/08/23 09:34  Vitals shown include unfiled device data.  Last Pain:  Vitals:   11/08/23 0701  TempSrc: Oral  PainSc: 0-No pain      Patients Stated Pain Goal: 4 (11/08/23 0701)  Complications: No notable events documented.

## 2023-11-08 NOTE — Interval H&P Note (Signed)
 History and Physical Interval Note:  11/08/2023 8:05 AM  Deanna Hughes  has presented today for surgery, with the diagnosis of VENTRAL HERNIA, 7 CM.  The various methods of treatment have been discussed with the patient and family. After consideration of risks, benefits and other options for treatment, the patient has consented to  Procedure(s) with comments: REPAIR, HERNIA, VENTRAL (N/A) - W/ MESH as a surgical intervention.  The patient's history has been reviewed, patient examined, no change in status, stable for surgery.  I have reviewed the patient's chart and labs.  Questions were answered to the patient's satisfaction.     Oneil Budge

## 2023-11-09 ENCOUNTER — Encounter (HOSPITAL_COMMUNITY): Payer: Self-pay | Admitting: General Surgery

## 2023-11-10 NOTE — Anesthesia Postprocedure Evaluation (Signed)
 Anesthesia Post Note  Patient: Kimball LITTIE Shropshire  Procedure(s) Performed: REPAIR, HERNIA, VENTRAL WITH MESH  Patient location during evaluation: Phase II Anesthesia Type: General Level of consciousness: awake Pain management: pain level controlled Vital Signs Assessment: post-procedure vital signs reviewed and stable Respiratory status: spontaneous breathing and respiratory function stable Cardiovascular status: blood pressure returned to baseline and stable Postop Assessment: no headache and no apparent nausea or vomiting Anesthetic complications: no Comments: Late entry   No notable events documented.   Last Vitals:  Vitals:   11/08/23 1045 11/08/23 1100  BP: 104/73 108/75  Pulse: 80 88  Resp: 20 20  Temp:  36.9 C  SpO2: 97% 97%    Last Pain:  Vitals:   11/09/23 1624  TempSrc:   PainSc: 4                  Yvonna JINNY Bosworth

## 2023-11-16 ENCOUNTER — Telehealth: Payer: Self-pay | Admitting: Family Medicine

## 2023-11-16 ENCOUNTER — Encounter: Payer: Self-pay | Admitting: General Surgery

## 2023-11-16 ENCOUNTER — Ambulatory Visit (INDEPENDENT_AMBULATORY_CARE_PROVIDER_SITE_OTHER): Admitting: General Surgery

## 2023-11-16 ENCOUNTER — Encounter: Payer: Self-pay | Admitting: *Deleted

## 2023-11-16 VITALS — BP 96/71 | HR 94 | Temp 98.2°F | Resp 14 | Ht 62.0 in | Wt 190.0 lb

## 2023-11-16 DIAGNOSIS — Z8719 Personal history of other diseases of the digestive system: Secondary | ICD-10-CM

## 2023-11-16 DIAGNOSIS — Z9889 Other specified postprocedural states: Secondary | ICD-10-CM

## 2023-11-16 MED ORDER — OXYCODONE HCL 5 MG PO TABS: 5.0000 mg | ORAL_TABLET | ORAL | 0 refills | Status: AC | PRN

## 2023-11-16 NOTE — Telephone Encounter (Signed)
 After multiple attempts to fax paperwork to [facility], the patient called me with an email, [email or web address]. Paperwork completed and sent via email with confirmation.   Patient out of work starting 11/08/2023 and may return to work on 12/04/2023 unrestricted.

## 2023-11-16 NOTE — Progress Notes (Signed)
 Subjective:     Deanna Hughes  Patient here for postoperative visit status post umbilical herniorrhaphy with mesh.  She is still having some incisional pain and firmness. Objective:    BP 96/71   Pulse 94   Temp 98.2 F (36.8 C) (Oral)   Resp 14   Ht 5' 2 (1.575 m)   Wt 190 lb (86.2 kg)   SpO2 98%   BMI 34.75 kg/m   General:  alert, cooperative, and no distress  Abdomen is soft, incision healing well.  Minimal induration present.  No erythema was present.  A supraumbilical swelling is not that prominent and may be due to the surgery.  I do not feel a discrete hernia.     Assessment:    Doing well postoperatively. Normal postoperative pain.    Plan:   I did reorder her oxycodone  as she cannot take NSAIDs.  May return to work without restrictions on 12/04/2023.  Follow-up here as needed.

## 2023-11-20 ENCOUNTER — Other Ambulatory Visit: Payer: Self-pay | Admitting: Family Medicine

## 2023-11-20 DIAGNOSIS — R5382 Chronic fatigue, unspecified: Secondary | ICD-10-CM | POA: Diagnosis not present

## 2023-11-20 DIAGNOSIS — Z111 Encounter for screening for respiratory tuberculosis: Secondary | ICD-10-CM | POA: Diagnosis not present

## 2023-11-24 DIAGNOSIS — Z23 Encounter for immunization: Secondary | ICD-10-CM | POA: Diagnosis not present

## 2023-11-24 DIAGNOSIS — R7301 Impaired fasting glucose: Secondary | ICD-10-CM | POA: Diagnosis not present

## 2023-11-24 DIAGNOSIS — E559 Vitamin D deficiency, unspecified: Secondary | ICD-10-CM | POA: Diagnosis not present

## 2023-11-24 DIAGNOSIS — Z111 Encounter for screening for respiratory tuberculosis: Secondary | ICD-10-CM | POA: Diagnosis not present

## 2023-11-24 DIAGNOSIS — E669 Obesity, unspecified: Secondary | ICD-10-CM | POA: Diagnosis not present

## 2023-11-24 DIAGNOSIS — E538 Deficiency of other specified B group vitamins: Secondary | ICD-10-CM | POA: Diagnosis not present

## 2023-11-27 ENCOUNTER — Other Ambulatory Visit (HOSPITAL_COMMUNITY)
Admission: RE | Admit: 2023-11-27 | Discharge: 2023-11-27 | Disposition: A | Discharge status: Home / Self Care | Source: Ambulatory Visit | Attending: Adult Health | Admitting: Adult Health

## 2023-11-27 ENCOUNTER — Ambulatory Visit (INDEPENDENT_AMBULATORY_CARE_PROVIDER_SITE_OTHER): Admitting: Adult Health

## 2023-11-27 ENCOUNTER — Encounter: Payer: Self-pay | Admitting: Adult Health

## 2023-11-27 VITALS — BP 103/69 | HR 96 | Ht 62.0 in | Wt 197.0 lb

## 2023-11-27 DIAGNOSIS — Z1151 Encounter for screening for human papillomavirus (HPV): Secondary | ICD-10-CM

## 2023-11-27 DIAGNOSIS — Z01419 Encounter for gynecological examination (general) (routine) without abnormal findings: Secondary | ICD-10-CM | POA: Insufficient documentation

## 2023-11-27 NOTE — Progress Notes (Signed)
 Patient ID: Deanna Hughes, female   DOB: Aug 21, 1990, 33 y.o.   MRN: [id number] History of Present Illness:  Deanna Hughes is a 33 year old black female,single, [id number] in for a well woman gyn exam and pap. She had umbilical hernia repair about 3 weeks ago. She has noticed vaginal discharge and some itching. She is working at [employer].   PCP is Dr Shona  Current Medications, Allergies, Past Medical History, Past Surgical History, Family History and Social History were reviewed in [employer] [facility] electronic medical record.     Review of Systems: Patient denies any headaches, hearing loss, fatigue, blurred vision, shortness of breath, chest pain, abdominal pain, problems with bowel movements, urination, or intercourse(no sex in about 9 months). No joint pain or mood swings.  +vaginal discharge with some itching    Physical Exam:BP 103/69 (BP Location: Right Arm, Patient Position: Sitting, Cuff Size: Normal)   Pulse 96   Ht 5' 2 (1.575 m)   Wt 197 lb (89.4 kg)   LMP 10/30/2023 (Approximate)   BMI 36.03 kg/m   General:  Well developed, well nourished, no acute distress Skin:  Warm and dry Neck:  Midline trachea, normal thyroid , good ROM, no lymphadenopathy Lungs; Clear to auscultation bilaterally Breast:  No dominant palpable mass, retraction, or nipple discharge Cardiovascular: Regular rate and rhythm Abdomen:  Soft, non tender, no hepatosplenomegaly, still has tenderness at navel Pelvic:  External genitalia is normal in appearance, no lesions.  The vagina is normal in appearance,scant white discharge. Urethra has no lesions or masses. The cervix is bulbous.pap with HR HPV genotyping performed.  Uterus is felt to be normal size, shape, and contour.  No adnexal masses or tenderness noted.Bladder is non tender, no masses felt. Extremities/musculoskeletal:  No swelling or varicosities noted, no clubbing or cyanosis Psych:  No mood changes, alert and cooperative,seems happy AA is 0     11/27/2023    2:36 PM 03/23/2022    1:38 PM 10/14/2020    3:05 PM  Depression screen PHQ 2/9  Decreased Interest 0 0 3  Down, Depressed, Hopeless 0 1 1  PHQ - 2 Score 0 1 4  Altered sleeping 0 0 0  Tired, decreased energy 0 2 2  Change in appetite 0 2 0  Feeling bad or failure about yourself  0 0 1  Trouble concentrating 0 0 0  Moving slowly or fidgety/restless 0 0 0  Suicidal thoughts 0 0 0  PHQ-9 Score 0 5  7      Data saved with a previous flowsheet row definition       11/27/2023    2:36 PM 03/23/2022    1:39 PM 10/14/2020    3:06 PM 10/02/2018   10:55 AM  GAD 7 : Generalized Anxiety Score  Nervous, Anxious, on Edge 0 0 0 2  Control/stop worrying 0 0 0 1  Worry too much - different things 0 0 0 1  Trouble relaxing 0 0 0 0  Restless 0 0 0 0  Easily annoyed or irritable 0 0 0 1  Afraid - awful might happen 0 0 0 0  Total GAD 7 Score 0 0 0 5  Anxiety Difficulty    Very difficult    Upstream - 11/27/23 1448       Pregnancy Intention Screening   Does the patient want to become pregnant in the next year? No    Does the patient's partner want to become pregnant in the next year? No  Would the patient like to discuss contraceptive options today? No      Contraception Wrap Up   Current Method Abstinence;Hormonal Implant    End Method Abstinence;Hormonal Implant    Contraception Counseling Provided Yes           Examination chaperoned by Clarita Salt LPN  Impression and plan: 1. Encounter for gynecological examination with Papanicolaou smear of cervix (Primary) Pap sent Pap in 3 years if normal Physical in 1 year Labs with PCP  - Cytology - PAP( [place])

## 2023-11-29 LAB — CYTOLOGY - PAP
Comment: NEGATIVE
Diagnosis: NEGATIVE
High risk HPV: NEGATIVE

## 2023-11-30 ENCOUNTER — Ambulatory Visit: Payer: Self-pay | Admitting: Adult Health

## 2023-12-04 ENCOUNTER — Telehealth: Payer: Self-pay | Admitting: *Deleted

## 2023-12-04 NOTE — Telephone Encounter (Signed)
 Surgical Date: 11/08/2023 Procedure: VENTRAL HERNIA REPAIR W/ MESH  Received call from patient [phone]~ telephone.   Patient reports red area under incision that she would like evaluated. Abdomen remains rigid per patient report. Denies pain, warmth to touch, drainage, fever/ chills.   Advised that no available appointment for provider noted. Advised that she can come in for nurse visit, or send photos for virtual evaluation.   Patient declined stating that she would go to urgent care.

## 2024-01-02 DIAGNOSIS — Z79899 Other long term (current) drug therapy: Secondary | ICD-10-CM | POA: Diagnosis not present

## 2024-01-02 DIAGNOSIS — M542 Cervicalgia: Secondary | ICD-10-CM | POA: Diagnosis not present

## 2024-01-02 DIAGNOSIS — M419 Scoliosis, unspecified: Secondary | ICD-10-CM | POA: Diagnosis not present

## 2024-01-02 DIAGNOSIS — K122 Cellulitis and abscess of mouth: Secondary | ICD-10-CM | POA: Diagnosis not present

## 2024-01-05 ENCOUNTER — Encounter (HOSPITAL_COMMUNITY): Payer: Self-pay

## 2024-01-05 ENCOUNTER — Emergency Department (HOSPITAL_COMMUNITY)
Admission: EM | Admit: 2024-01-05 | Discharge: 2024-01-06 | Disposition: A | Attending: Emergency Medicine | Admitting: Emergency Medicine

## 2024-01-05 ENCOUNTER — Emergency Department (HOSPITAL_COMMUNITY)

## 2024-01-05 ENCOUNTER — Other Ambulatory Visit: Payer: Self-pay

## 2024-01-05 DIAGNOSIS — S81051A Open bite, right knee, initial encounter: Secondary | ICD-10-CM | POA: Diagnosis not present

## 2024-01-05 DIAGNOSIS — Z23 Encounter for immunization: Secondary | ICD-10-CM | POA: Insufficient documentation

## 2024-01-05 DIAGNOSIS — Z91040 Latex allergy status: Secondary | ICD-10-CM | POA: Diagnosis not present

## 2024-01-05 DIAGNOSIS — S71131A Puncture wound without foreign body, right thigh, initial encounter: Secondary | ICD-10-CM | POA: Insufficient documentation

## 2024-01-05 DIAGNOSIS — W540XXA Bitten by dog, initial encounter: Secondary | ICD-10-CM | POA: Insufficient documentation

## 2024-01-05 MED ORDER — AMOXICILLIN-POT CLAVULANATE 875-125 MG PO TABS
1.0000 | ORAL_TABLET | Freq: Once | ORAL | Status: DC
  Filled 2024-01-05: qty 1

## 2024-01-05 MED ORDER — TETANUS-DIPHTH-ACELL PERTUSSIS 5-2-15.5 LF-MCG/0.5 IM SUSP
0.5000 mL | Freq: Once | INTRAMUSCULAR | Status: AC
  Administered 2024-01-05: 0.5 mL via INTRAMUSCULAR
  Filled 2024-01-05: qty 0.5

## 2024-01-05 NOTE — ED Triage Notes (Signed)
 Bite to upper right thigh from own puppy. Two puncture spots

## 2024-01-05 NOTE — ED Provider Notes (Signed)
 " [place] EMERGENCY DEPARTMENT AT [facility] Provider Note   CSN: [id number] Arrival date & time: 01/05/24  2300     Patient presents with: Animal Bite   Deanna Hughes is a 33 y.o. female.  {Add pertinent medical, surgical, social history, OB history to YEP:67052}  Animal Bite Patient presents after dog bite.  Medical history includes scoliosis, migraines, anxiety.  This evening, she was bit by her dog.  She describes her dog is approximately 34 months old.  Although he has received his first round of vaccinations, he has not yet received his rabies shot.  He was in an excitable state at home.  He bit her a single time and area of distal left thigh.  She denies any other areas of injury.     Prior to Admission medications  Medication Sig Start Date End Date Taking? Authorizing Provider  Cholecalciferol 1.25 MG (50000 UT) capsule Take 1 capsule every week by oral route, for vitamin d supplementation. 07/11/23   [provider]  etonogestrel  (NEXPLANON ) 68 MG IMPL implant 1 each by Subdermal route once.    [provider]  fluticasone  (FLONASE ) 50 MCG/ACT nasal spray SHAKE LIQUID AND USE 2 SPRAYS IN EACH NOSTRIL EVERY DAY 07/11/23   [provider]  methocarbamol  (ROBAXIN ) 500 MG tablet Take 1 tablet (500 mg total) by mouth 2 (two) times daily as needed for muscle spasms. 10/09/23   Dufour, Marry RAMAN, PA-C  oxyCODONE  (ROXICODONE ) 5 MG immediate release tablet Take 1 tablet (5 mg total) by mouth every 4 (four) hours as needed. 11/16/23 11/15/24  Mavis Anes, MD  Vitamin D, Ergocalciferol, (DRISDOL) 1.25 MG (50000 UNIT) CAPS capsule Take 50,000 Units by mouth once a week. 01/01/21   [provider]    Allergies: Aspirin, Advil  [ibuprofen ], and Latex    Review of Systems  Skin:  Positive for wound.  All other systems reviewed and are negative.   Updated Vital Signs BP 112/70 (BP Location: Right Arm)   Pulse 93   Resp 18   Ht 5'  2 (1.575 m)   Wt 89.4 kg   SpO2 100%   BMI 36.05 kg/m   Physical Exam Vitals and nursing note reviewed.  Constitutional:      General: She is not in acute distress.    Appearance: Normal appearance. She is well-developed. She is not ill-appearing, toxic-appearing or diaphoretic.  HENT:     Head: Normocephalic and atraumatic.     Right Ear: External ear normal.     Left Ear: External ear normal.     Nose: Nose normal.     Mouth/Throat:     Mouth: Mucous membranes are moist.  Eyes:     Extraocular Movements: Extraocular movements intact.     Conjunctiva/sclera: Conjunctivae normal.  Cardiovascular:     Rate and Rhythm: Normal rate and regular rhythm.  Pulmonary:     Effort: Pulmonary effort is normal. No respiratory distress.  Abdominal:     General: There is no distension.     Palpations: Abdomen is soft.     Tenderness: There is no abdominal tenderness.  Musculoskeletal:        General: Tenderness and signs of injury present.     Cervical back: Normal range of motion and neck supple.  Skin:    General: Skin is warm and dry.     Coloration: Skin is not jaundiced or pale.     Findings: Bruising present.  Neurological:  General: No focal deficit present.     Mental Status: She is alert and oriented to person, place, and time.     Cranial Nerves: No cranial nerve deficit.     Sensory: No sensory deficit.     Motor: No weakness.     Coordination: Coordination normal.  Psychiatric:        Mood and Affect: Mood normal.        Behavior: Behavior normal.     (all labs ordered are listed, but only abnormal results are displayed) Labs Reviewed - No data to display  EKG: None  Radiology: No results found.  {Document cardiac monitor, telemetry assessment procedure when appropriate:32947} Procedures   Medications Ordered in the ED - No data to display    {Click here for ABCD2, HEART and other calculators REFRESH Note before signing:1}                               Medical Decision Making Amount and/or Complexity of Data Reviewed Radiology: ordered.  Risk Prescription drug management.   Patient presenting after dog bite.  This was her dog.  {Document critical care time when appropriate  Document review of labs and clinical decision tools ie CHADS2VASC2, etc  Document your independent review of radiology images and any outside records  Document your discussion with family members, caretakers and with consultants  Document social determinants of health affecting pt's care  Document your decision making why or why not admission, treatments were needed:32947:::1}   Final diagnoses:  None    ED Discharge Orders     None        "

## 2024-01-06 NOTE — Discharge Instructions (Addendum)
 Continue taking the Augmentin  that you are already on for the next 7 days.  This is to avoid an infection from your dog bite.  Take ibuprofen  or Tylenol  as needed for pain and soreness.  Rabies from dog bites is rare.  That being said, monitor your dog for the next 10 days.  If he/she begins to seem unwell, they should get tested for rabies.  If they test positive for rabies, you will need rabies prophylaxis.  If area of wound becomes increasingly red, swollen, painful, or begins draining pus, please return to the emergency department.

## 2024-01-16 ENCOUNTER — Other Ambulatory Visit (HOSPITAL_COMMUNITY): Payer: Self-pay | Admitting: Family Medicine

## 2024-01-16 DIAGNOSIS — M542 Cervicalgia: Secondary | ICD-10-CM

## 2024-01-16 DIAGNOSIS — M546 Pain in thoracic spine: Secondary | ICD-10-CM | POA: Diagnosis not present

## 2024-01-16 DIAGNOSIS — K122 Cellulitis and abscess of mouth: Secondary | ICD-10-CM | POA: Diagnosis not present

## 2024-02-12 ENCOUNTER — Telehealth: Admitting: Family Medicine

## 2024-02-12 DIAGNOSIS — K047 Periapical abscess without sinus: Secondary | ICD-10-CM | POA: Diagnosis not present

## 2024-02-12 MED ORDER — CLINDAMYCIN HCL 300 MG PO CAPS: 300.0000 mg | ORAL_CAPSULE | Freq: Three times a day (TID) | ORAL | 0 refills | Status: AC

## 2024-02-12 NOTE — Progress Notes (Signed)
 " Virtual Visit Consent   Deanna Hughes, you are scheduled for a virtual visit with a [facility] provider today. Just as with appointments in the office, your consent must be obtained to participate. Your consent will be active for this visit and any virtual visit you may have with one of our providers in the next 365 days. If you have a MyChart account, a copy of this consent can be sent to you electronically.  As this is a virtual visit, video technology does not allow for your provider to perform a traditional examination. This may limit your provider's ability to fully assess your condition. If your provider identifies any concerns that need to be evaluated in person or the need to arrange testing (such as labs, EKG, etc.), we will make arrangements to do so. Although advances in technology are sophisticated, we cannot ensure that it will always work on either your end or our end. If the connection with a video visit is poor, the visit may have to be switched to a telephone visit. With either a video or telephone visit, we are not always able to ensure that we have a secure connection.  By engaging in this virtual visit, you consent to the provision of healthcare and authorize for your insurance to be billed (if applicable) for the services provided during this visit. Depending on your insurance coverage, you may receive a charge related to this service.  I need to obtain your verbal consent now. Are you willing to proceed with your visit today? Deanna Hughes has provided verbal consent on 02/12/2024 for a virtual visit (video or telephone). Deanna Lamp, FNP  Date: 02/12/2024 4:25 PM   Virtual Visit via Video Note   I, Deanna Hughes, connected with  Deanna Hughes  ([id number], 1990/05/10) on 02/12/24 at  4:15 PM EST by a video-enabled telemedicine application and verified that I am speaking with the correct person using two identifiers.  Location: Patient: Virtual Visit Location Patient:  Home Provider: Virtual Visit Location Provider: Home Office   I discussed the limitations of evaluation and management by telemedicine and the availability of in person appointments. The patient expressed understanding and agreed to proceed.    History of Present Illness: Deanna Hughes is a 34 y.o. who identifies as a female who was assigned female at birth, and is being seen today for dental abscess. Treated 3 weeks ago with augmentin  and has returned. She will call today for appmt with dentist asap for treatment. No fever. In no distress. Deanna Hughes  HPI: HPI  Problems:  Patient Active Problem List   Diagnosis Date Noted   Ventral hernia without obstruction or gangrene 11/08/2023   Diarrhea 04/24/2023   Abdominal pain 04/24/2023   Epicondylitis, medial humeral, right 12/07/2022   Blood in urine 12/07/2022   Palpitations 12/07/2022   Impaired fasting glucose 06/06/2022   Swelling of ankle joint 06/06/2022   Disorder of vitamin B12 05/18/2022   Seasonal allergic rhinitis 05/18/2022   Encounter for well woman exam with routine gynecological exam 03/23/2022   Urinary frequency 03/23/2022   Pain with urination 03/23/2022   Vaginal odor 03/23/2022   Nexplanon  in place 03/23/2022   Leg pain, bilateral 09/01/2021   Obesity 08/03/2021   Scoliosis deformity of spine 01/12/2021   Vitamin D deficiency 01/01/2021   Encounter for initial prescription of contraceptive pills 10/19/2020   Leg swelling 07/09/2020   Sore throat 07/09/2020   Decreased libido 03/31/2020   Depo-Provera  contraceptive status 03/31/2020  Irregular bleeding 03/31/2020   BV (bacterial vaginosis) 03/31/2020   Rash and nonspecific skin eruption 10/03/2019   Intractable chronic migraine without aura and without status migrainosus 10/03/2019   Umbilical hernia 03/18/2019   Migraine without aura and without status migrainosus, not intractable 02/11/2019   Internal hemorrhoids    Other idiopathic scoliosis, thoracolumbar  region 05/17/2018   Dental infection 03/09/2017    Allergies: Allergies[1] Medications: Current Medications[2]  Observations/Objective: Patient is well-developed, well-nourished in no acute distress.  Resting comfortably  at home.  Head is normocephalic, atraumatic.  No labored breathing.  Speech is clear and coherent with logical content.  Patient is alert and oriented at baseline.    Assessment and Plan: 1. Dental abscess (Primary)  Warm salt water  rinses, ibuprofen  as directed, UC if sx worsen.   Follow Up Instructions: I discussed the assessment and treatment plan with the patient. The patient was provided an opportunity to ask questions and all were answered. The patient agreed with the plan and demonstrated an understanding of the instructions.  A copy of instructions were sent to the patient via MyChart unless otherwise noted below.     The patient was advised to call back or seek an in-person evaluation if the symptoms worsen or if the condition fails to improve as anticipated.    Gurveer Colucci, FNP     [1]  Allergies Allergen Reactions   Aspirin Shortness Of Breath and Other (See Comments)    Heart beats fast   Advil  [Ibuprofen ] Other (See Comments)    Breakout, heart palpitations and trouble breathing.    Latex Rash  [2]  Current Outpatient Medications:    Cholecalciferol 1.25 MG (50000 UT) capsule, Take 1 capsule every week by oral route, for vitamin d supplementation., Disp: , Rfl:    etonogestrel  (NEXPLANON ) 68 MG IMPL implant, 1 each by Subdermal route once., Disp: , Rfl:    fluticasone  (FLONASE ) 50 MCG/ACT nasal spray, SHAKE LIQUID AND USE 2 SPRAYS IN EACH NOSTRIL EVERY DAY, Disp: , Rfl:    methocarbamol  (ROBAXIN ) 500 MG tablet, Take 1 tablet (500 mg total) by mouth 2 (two) times daily as needed for muscle spasms., Disp: 20 tablet, Rfl: 0   oxyCODONE  (ROXICODONE ) 5 MG immediate release tablet, Take 1 tablet (5 mg total) by mouth every 4 (four) hours as  needed., Disp: 20 tablet, Rfl: 0   Vitamin D, Ergocalciferol, (DRISDOL) 1.25 MG (50000 UNIT) CAPS capsule, Take 50,000 Units by mouth once a week., Disp: , Rfl:   "

## 2024-02-12 NOTE — Patient Instructions (Signed)
 Dental Abscess  A dental abscess is an infection around a tooth that may involve pain, swelling, and a collection of pus, as well as other symptoms. Treatment is important to help with symptoms and to prevent the infection from spreading. The general types of dental abscesses are: Pulpal abscess. This abscess may form from the inner part of the tooth (pulp). Periodontal abscess. This abscess may form from the gum. What are the causes? This condition is caused by a bacterial infection in or around the tooth. It may result from: Severe tooth decay (cavities). Trauma to the tooth, such as a broken or chipped tooth. What increases the risk? This condition is more likely to develop in males. It is also more likely to develop in people who: Have cavities. Have severe gum disease. Eat sugary snacks between meals. Use tobacco products. Have diabetes. Have a weakened disease-fighting system (immune system). Do not brush and care for their teeth regularly. What are the signs or symptoms? Mild symptoms of this condition include: Tenderness. Bad breath. Fever. A bitter taste in the mouth. Pain in and around the infected tooth. Moderate symptoms of this condition include: Swollen neck glands. Chills. Pus drainage. Swelling and redness around the infected tooth, in the mouth, or in the face. Severe pain in and around the infected tooth. Severe symptoms of this condition include: Difficulty swallowing. Difficulty opening the mouth. Nausea. Vomiting. How is this diagnosed? This condition is diagnosed based on: Your symptoms and your medical and dental history. An examination of the infected tooth. During the exam, your dental care provider may tap on the infected tooth. You may also need to have X-rays taken of the affected area. How is this treated? This condition is treated by getting rid of the infection. This may be done with: Antibiotic medicines. These may be used in certain  situations. Antibacterial mouth rinse. Incision and drainage. This procedure is done by making an incision in the abscess to drain out the pus. Removing pus is the first priority in treating an abscess. A root canal. This may be performed to save the tooth. Your dental care provider accesses the visible part of your tooth (crown) with a drill and removes any infected pulp. Then the space is filled and sealed off. Tooth extraction. The tooth is pulled out if it cannot be saved by other treatment. You may also receive treatment for pain, such as: Acetaminophen or NSAIDs. Gels that contain a numbing medicine. An injection to block the pain near your nerve. Follow these instructions at home: Medicines Take over-the-counter and prescription medicines only as told by your dental care provider. If you were prescribed an antibiotic, take it as told by your dental care provider. Do not stop taking the antibiotic even if you start to feel better. If you were prescribed a gel that contains a numbing medicine, use it exactly as told in the directions. Do not use these gels for children who are younger than 80 years of age. Use an antibacterial mouth rinse as told by your dental care provider. General instructions  Gargle with a mixture of salt and water 3-4 times a day or as needed. To make salt water, completely dissolve -1 tsp (3-6 g) of salt in 1 cup (237 mL) of warm water. Eat a soft diet while your abscess is healing. Drink enough fluid to keep your urine pale yellow. Do not apply heat to the outside of your mouth. Do not use any products that contain nicotine or tobacco. These  products include cigarettes, chewing tobacco, and vaping devices, such as e-cigarettes. If you need help quitting, ask your dental care provider. Keep all follow-up visits. This is important. How is this prevented?  Excellent dental home care, which includes brushing your teeth every morning and night with fluoride  toothpaste. Floss one time each day. Get regularly scheduled dental cleanings. Consider having a dental sealant applied on teeth that have deep grooves to prevent cavities. Drink fluoridated water regularly. This includes most tap water. Check the label on bottled water to see if it contains fluoride. Reduce or eliminate sugary drinks. Eat healthy meals and snacks. Wear a mouth guard or face shield to protect your teeth while playing sports. Contact a health care provider if: Your pain is worse and is not helped by medicine. You have swelling. You see pus around the tooth. You have a fever or chills. Get help right away if: Your symptoms suddenly get worse. You have a very bad headache. You have problems breathing or swallowing. You have trouble opening your mouth. You have swelling in your neck or around your eye. These symptoms may represent a serious problem that is an emergency. Do not wait to see if the symptoms will go away. Get medical help right away. Call your local emergency services (911 in the [place] Do not drive yourself to the hospital. Summary A dental abscess is a collection of pus in or around a tooth that results from an infection. A dental abscess may result from severe tooth decay, trauma to the tooth, or severe gum disease around a tooth. Symptoms include severe pain, swelling, redness, and drainage of pus in and around the infected tooth. The first priority in treating a dental abscess is to drain out the pus. Treatment may also involve removing damage inside the tooth (root canal) or extracting the tooth. This information is not intended to replace advice given to you by your health care provider. Make sure you discuss any questions you have with your health care provider. Document Revised: 03/12/2020 Document Reviewed: 03/12/2020 [employer] Patient Education  2024 [employer].

## 2024-03-04 ENCOUNTER — Encounter: Admitting: Adult Health
